# Patient Record
Sex: Female | Born: 1935
Health system: Southern US, Community
[De-identification: ages and names within clinical notes are randomized; demographics above are authoritative.]

## PROBLEM LIST (undated history)

## (undated) DIAGNOSIS — N3946 Mixed incontinence: Secondary | ICD-10-CM

## (undated) DIAGNOSIS — Z8673 Personal history of transient ischemic attack (TIA), and cerebral infarction without residual deficits: Secondary | ICD-10-CM

## (undated) DIAGNOSIS — M797 Fibromyalgia: Secondary | ICD-10-CM

## (undated) DIAGNOSIS — D649 Anemia, unspecified: Secondary | ICD-10-CM

## (undated) DIAGNOSIS — Z87898 Personal history of other specified conditions: Secondary | ICD-10-CM

## (undated) DIAGNOSIS — E0520 Thyrotoxicosis with toxic multinodular goiter without thyrotoxic crisis or storm: Secondary | ICD-10-CM

## (undated) DIAGNOSIS — I10 Essential (primary) hypertension: Secondary | ICD-10-CM

## (undated) DIAGNOSIS — Z8679 Personal history of other diseases of the circulatory system: Secondary | ICD-10-CM

## (undated) DIAGNOSIS — H409 Unspecified glaucoma: Secondary | ICD-10-CM

## (undated) DIAGNOSIS — N200 Calculus of kidney: Secondary | ICD-10-CM

## (undated) DIAGNOSIS — N281 Cyst of kidney, acquired: Secondary | ICD-10-CM

## (undated) DIAGNOSIS — Z8619 Personal history of other infectious and parasitic diseases: Secondary | ICD-10-CM

## (undated) DIAGNOSIS — Z8659 Personal history of other mental and behavioral disorders: Secondary | ICD-10-CM

## (undated) DIAGNOSIS — M1990 Unspecified osteoarthritis, unspecified site: Secondary | ICD-10-CM

## (undated) DIAGNOSIS — K219 Gastro-esophageal reflux disease without esophagitis: Secondary | ICD-10-CM

## (undated) DIAGNOSIS — E785 Hyperlipidemia, unspecified: Secondary | ICD-10-CM

## (undated) DIAGNOSIS — K2270 Barrett's esophagus without dysplasia: Secondary | ICD-10-CM

## (undated) DIAGNOSIS — I700 Atherosclerosis of aorta: Secondary | ICD-10-CM

## (undated) DIAGNOSIS — I2510 Atherosclerotic heart disease of native coronary artery without angina pectoris: Secondary | ICD-10-CM

## (undated) DIAGNOSIS — K5790 Diverticulosis of intestine, part unspecified, without perforation or abscess without bleeding: Secondary | ICD-10-CM

## (undated) DIAGNOSIS — K8689 Other specified diseases of pancreas: Secondary | ICD-10-CM

## (undated) HISTORY — PX: OTHER SURGICAL HISTORY: SHX169

## (undated) HISTORY — DX: Atherosclerosis of aorta: I70.0

## (undated) HISTORY — DX: Thyrotoxicosis with toxic multinodular goiter without thyrotoxic crisis or storm: E05.20

## (undated) HISTORY — DX: Essential (primary) hypertension: I10

## (undated) HISTORY — DX: Personal history of other diseases of the circulatory system: Z86.79

## (undated) HISTORY — DX: Personal history of other infectious and parasitic diseases: Z86.19

## (undated) HISTORY — DX: Personal history of other mental and behavioral disorders: Z86.59

## (undated) HISTORY — DX: Personal history of transient ischemic attack (TIA), and cerebral infarction without residual deficits: Z86.73

## (undated) HISTORY — DX: Atherosclerotic heart disease of native coronary artery without angina pectoris: I25.10

## (undated) HISTORY — DX: Hyperlipidemia, unspecified: E78.5

## (undated) HISTORY — DX: Personal history of other specified conditions: Z87.898

## (undated) HISTORY — DX: Calculus of kidney: N20.0

## (undated) HISTORY — DX: Anemia, unspecified: D64.9

## (undated) HISTORY — DX: Gastro-esophageal reflux disease without esophagitis: K21.9

## (undated) HISTORY — DX: Other specified diseases of pancreas: K86.89

## (undated) HISTORY — DX: Unspecified osteoarthritis, unspecified site: M19.90

## (undated) HISTORY — DX: Fibromyalgia: M79.7

## (undated) HISTORY — DX: Barrett's esophagus without dysplasia: K22.70

## (undated) HISTORY — DX: Mixed incontinence: N39.46

## (undated) HISTORY — DX: Cyst of kidney, acquired: N28.1

## (undated) HISTORY — DX: Diverticulosis of intestine, part unspecified, without perforation or abscess without bleeding: K57.90

## (undated) HISTORY — DX: Unspecified glaucoma: H40.9

---

## 1988-09-11 HISTORY — PX: CATARACT EXTRACTION: SUR2

## 2004-09-11 HISTORY — PX: CHOLECYSTECTOMY: SHX55

## 2006-09-11 DIAGNOSIS — Z8673 Personal history of transient ischemic attack (TIA), and cerebral infarction without residual deficits: Secondary | ICD-10-CM

## 2006-09-11 HISTORY — DX: Personal history of transient ischemic attack (TIA), and cerebral infarction without residual deficits: Z86.73

## 2006-09-11 LAB — HM DEXA SCAN: HM DEXA SCAN: NORMAL

## 2009-09-11 DIAGNOSIS — Z8679 Personal history of other diseases of the circulatory system: Secondary | ICD-10-CM

## 2009-09-11 HISTORY — PX: LITHOTRIPSY: SUR834

## 2009-09-11 HISTORY — DX: Personal history of other diseases of the circulatory system: Z86.79

## 2011-09-05 ENCOUNTER — Emergency Department: Payer: Self-pay | Admitting: Emergency Medicine

## 2011-09-05 LAB — COMPREHENSIVE METABOLIC PANEL
ALT: 21 U/L (ref 7–35)
AST: 24 U/L
Albumin: 4.1
BUN: 7 mg/dL (ref 4–21)
Glucose: 97
Potassium: 3.2 mmol/L
Sodium: 139 mmol/L (ref 137–147)
Total Bilirubin: 0.7 mg/dL
Total Protein ELP: 8.4

## 2011-09-05 LAB — CBC: platelet count: 290

## 2011-09-05 IMAGING — CR DG ABDOMEN 3V
1 series · 4 of 4 positions shown · non-contrast
Comparison: none

REASON FOR EXAM: vomiting
COMMENTS:

[Series 1: pa · 0.17mm/px · 4 of 4 slices shown]
[im 1/4]
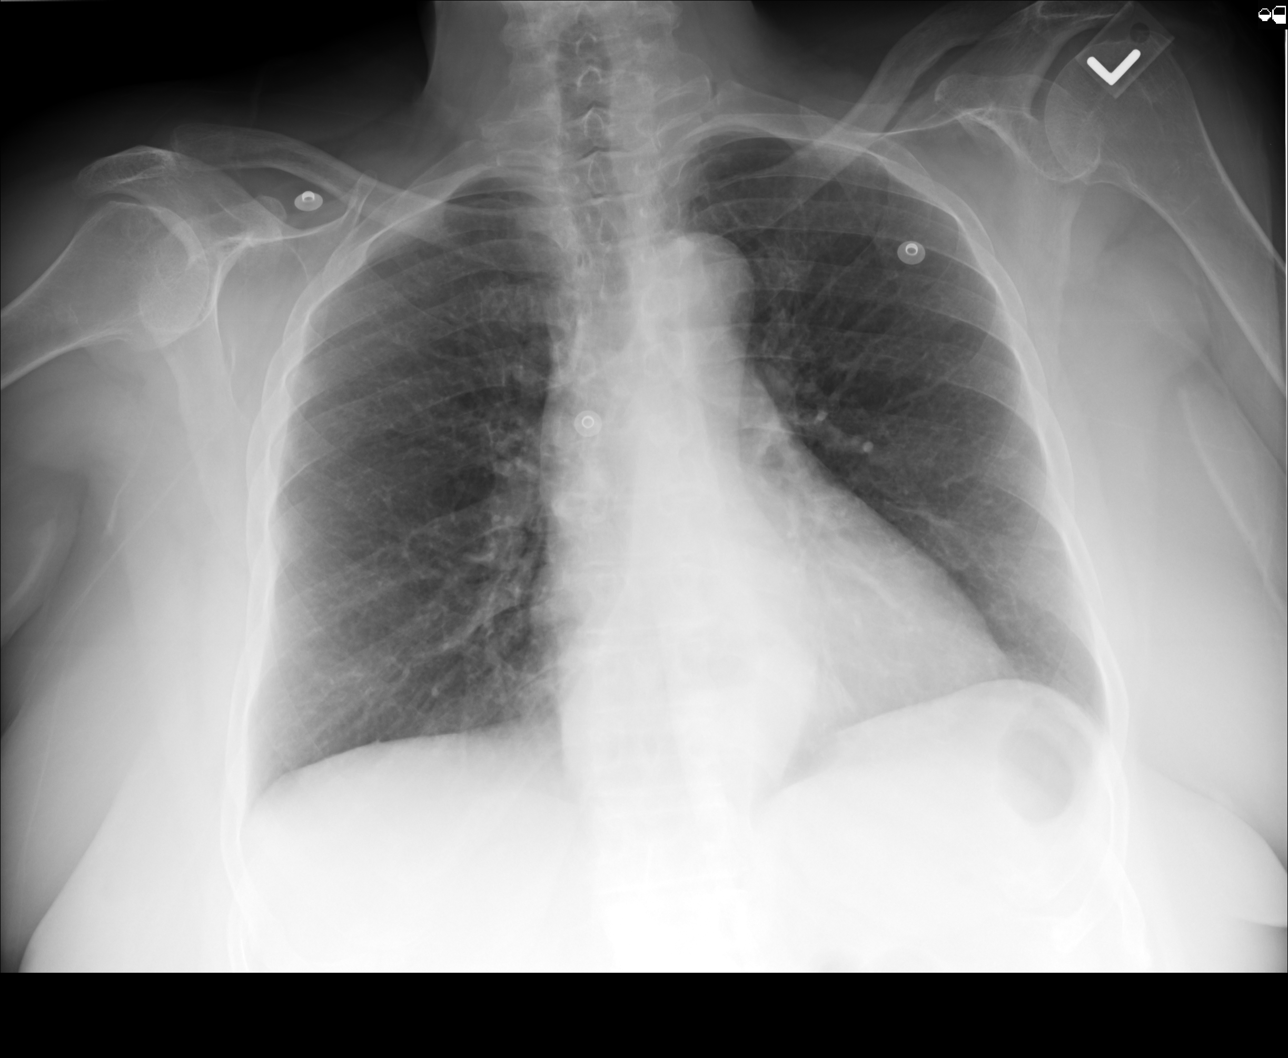
[im 2/4]
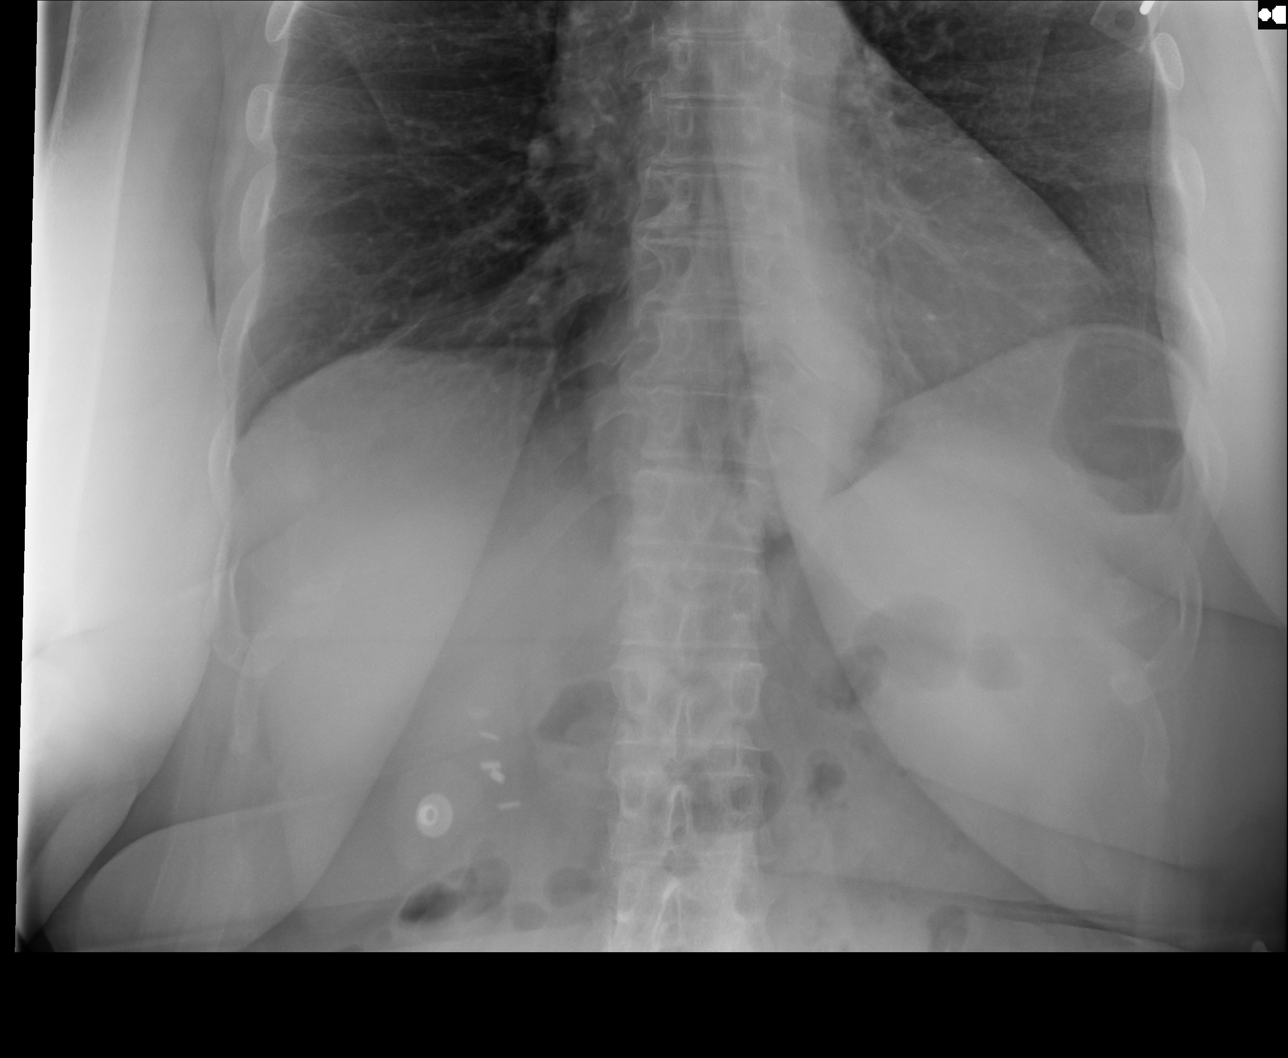
[im 3/4]
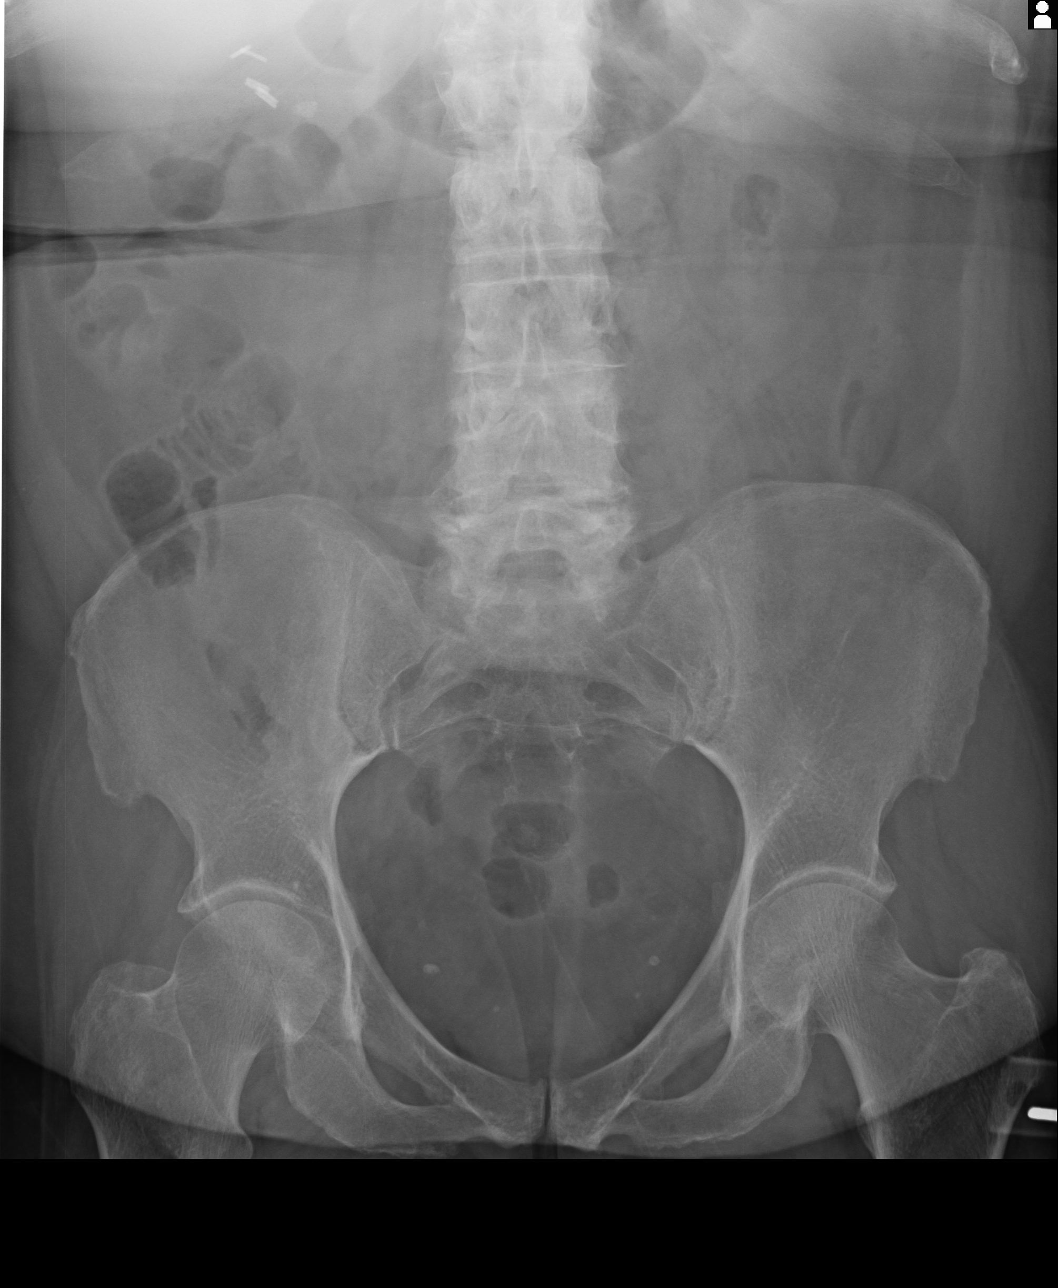
[im 4/4]
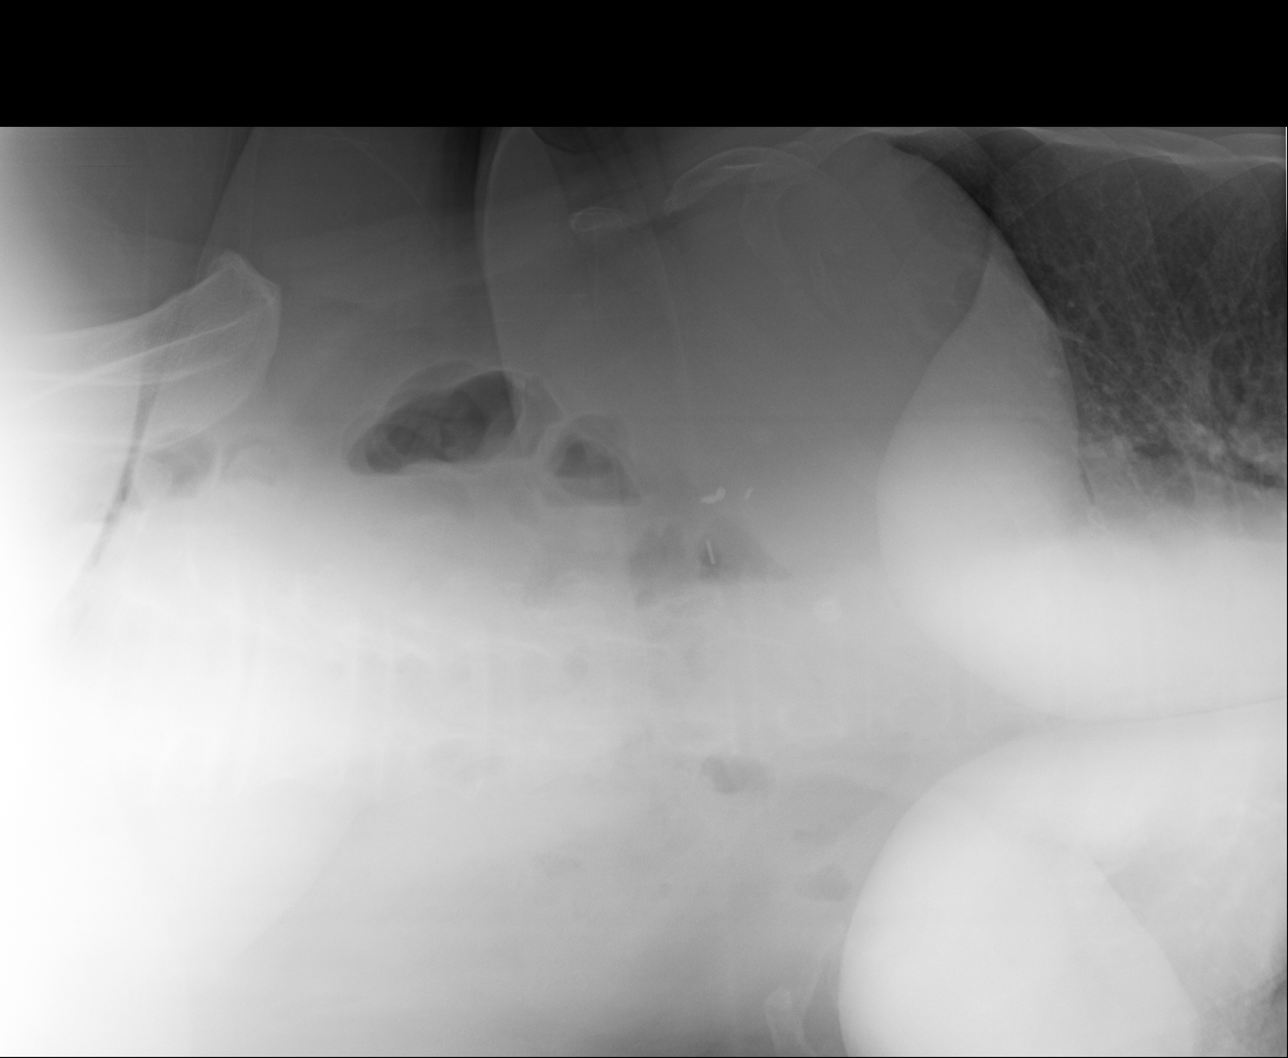

[4 of 4 positions shown; findings below may reference images not displayed]

PROCEDURE:     DXR - DXR ABDOMEN 3-WAY (INCL PA CXR)  - [DATE]  [DATE]

RESULT:     The lungs are well-expanded and clear. The cardiac silhouette is
normal in size. There is a hiatal hernia present. I see no pulmonary
vascular congestion nor pleural effusion. Within the upper abdomen the bowel
gas pattern is relatively nonspecific. There is no evidence of ileus nor
obstruction. There are surgical clips in the gallbladder fossa.
IMPRESSION: I do not see evidence of acute cardiopulmonary abnormality.
No acute intra-abdominal abnormality is identified either.

## 2011-09-28 ENCOUNTER — Ambulatory Visit (INDEPENDENT_AMBULATORY_CARE_PROVIDER_SITE_OTHER): Payer: Medicare HMO | Admitting: Family Medicine

## 2011-09-28 ENCOUNTER — Encounter: Payer: Self-pay | Admitting: Family Medicine

## 2011-09-28 DIAGNOSIS — K2270 Barrett's esophagus without dysplasia: Secondary | ICD-10-CM

## 2011-09-28 DIAGNOSIS — R1013 Epigastric pain: Secondary | ICD-10-CM | POA: Insufficient documentation

## 2011-09-28 DIAGNOSIS — K1370 Unspecified lesions of oral mucosa: Secondary | ICD-10-CM | POA: Insufficient documentation

## 2011-09-28 DIAGNOSIS — I10 Essential (primary) hypertension: Secondary | ICD-10-CM

## 2011-09-28 DIAGNOSIS — K219 Gastro-esophageal reflux disease without esophagitis: Secondary | ICD-10-CM

## 2011-09-28 DIAGNOSIS — E785 Hyperlipidemia, unspecified: Secondary | ICD-10-CM

## 2011-09-28 DIAGNOSIS — Z8673 Personal history of transient ischemic attack (TIA), and cerebral infarction without residual deficits: Secondary | ICD-10-CM | POA: Insufficient documentation

## 2011-09-28 DIAGNOSIS — IMO0001 Reserved for inherently not codable concepts without codable children: Secondary | ICD-10-CM

## 2011-09-28 DIAGNOSIS — J45909 Unspecified asthma, uncomplicated: Secondary | ICD-10-CM | POA: Insufficient documentation

## 2011-09-28 DIAGNOSIS — N3946 Mixed incontinence: Secondary | ICD-10-CM

## 2011-09-28 DIAGNOSIS — E0520 Thyrotoxicosis with toxic multinodular goiter without thyrotoxic crisis or storm: Secondary | ICD-10-CM | POA: Insufficient documentation

## 2011-09-28 DIAGNOSIS — E042 Nontoxic multinodular goiter: Secondary | ICD-10-CM

## 2011-09-28 DIAGNOSIS — M797 Fibromyalgia: Secondary | ICD-10-CM

## 2011-09-28 MED ORDER — PRAVASTATIN SODIUM 40 MG PO TABS: 40.0000 mg | ORAL_TABLET | Freq: Every day | ORAL | Status: DC

## 2011-09-28 MED ORDER — ESTROGENS, CONJUGATED 0.625 MG/GM VA CREA: 0.5000 g | TOPICAL_CREAM | VAGINAL | Status: DC

## 2011-09-28 MED ORDER — ERGOCALCIFEROL 1.25 MG (50000 UT) PO CAPS: 50000.0000 [IU] | ORAL_CAPSULE | ORAL | Status: DC

## 2011-09-28 MED ORDER — METOPROLOL TARTRATE 25 MG PO TABS: 25.0000 mg | ORAL_TABLET | Freq: Two times a day (BID) | ORAL | Status: DC

## 2011-09-28 MED ORDER — GABAPENTIN 300 MG PO CAPS: 300.0000 mg | ORAL_CAPSULE | Freq: Three times a day (TID) | ORAL | Status: DC

## 2011-09-28 MED ORDER — CLONIDINE HCL 0.1 MG PO TABS: 0.1000 mg | ORAL_TABLET | Freq: Two times a day (BID) | ORAL | Status: DC

## 2011-09-28 MED ORDER — AMLODIPINE BESYLATE 5 MG PO TABS: 5.0000 mg | ORAL_TABLET | Freq: Every day | ORAL | Status: DC

## 2011-09-28 MED ORDER — ONDANSETRON HCL 4 MG PO TABS: 4.0000 mg | ORAL_TABLET | Freq: Three times a day (TID) | ORAL | Status: DC | PRN

## 2011-09-28 MED ORDER — ESOMEPRAZOLE MAGNESIUM 40 MG PO CPDR: 40.0000 mg | DELAYED_RELEASE_CAPSULE | Freq: Every day | ORAL | Status: DC

## 2011-09-28 MED ORDER — TRAZODONE HCL 50 MG PO TABS: 50.0000 mg | ORAL_TABLET | Freq: Every day | ORAL | Status: DC

## 2011-09-28 MED ORDER — DOXYCYCLINE HYCLATE 100 MG PO CAPS: 100.0000 mg | ORAL_CAPSULE | Freq: Two times a day (BID) | ORAL | Status: AC

## 2011-09-28 MED ORDER — SUCRALFATE 1 G PO TABS: 1.0000 g | ORAL_TABLET | Freq: Four times a day (QID) | ORAL | Status: DC

## 2011-09-28 MED ORDER — PIRBUTEROL ACETATE 200 MCG/INH IN AERB: 2.0000 | INHALATION_SPRAY | Freq: Four times a day (QID) | RESPIRATORY_TRACT | Status: DC

## 2011-09-28 MED ORDER — OXYBUTYNIN CHLORIDE ER 10 MG PO TB24: 10.0000 mg | ORAL_TABLET | Freq: Every day | ORAL | Status: DC

## 2011-09-28 MED ORDER — METHIMAZOLE 5 MG PO TABS: 5.0000 mg | ORAL_TABLET | Freq: Every day | ORAL | Status: DC

## 2011-09-28 NOTE — Assessment & Plan Note (Signed)
Refer back to GI. 

## 2011-09-28 NOTE — Assessment & Plan Note (Signed)
Check FLP when returns fasting.  Refilled pravastatin. States intolerance to zocor and lipitor in past

## 2011-09-28 NOTE — Assessment & Plan Note (Addendum)
Mild on exam today.  However does endorse red flag sxs of weight loss and early satiety in h/o barett's esophagus. Will refer to GI for likely EGD. Change omeprazole to nexium daily. Have requested records today from prior PCP as well as evaluation at [facility], will review when arrive.  Pt states has had recent labwork at [facility]. RTC sooner if worsening. Continue zofran for nausea/vomiting.

## 2011-09-28 NOTE — Progress Notes (Addendum)
Subjective:    Patient ID: Brianna Woods, female    DOB: [birth year], 77 y.o.   MRN: [id number]  HPI CC: new medicare pt  Presents to establish today , comes in with daughter in law, Desma Paganini.  See scanned document re: concern about Ms Uram abusing pain meds.  Recently moved from [place].  Walks with rolling walker for "FM pain".   ==>ADDENDUM, unable to scan form, in essence per daughter in law, pt with hx of misusing pain meds.  Was on prozac for depression, didn't work so stopped.  Also tried effexor, didn't help.  Bad GERD with h/o barrett's esophagus, s/p stricture with dilation, last EGD 2008.  Takes omeprazole 40mg  daily.  14 lb weight loss in last 2 months.  States vomiting with any food.  Epigastric pain described as sharp pain then nagging and burning that stays mainly epigastric.  Stays with nausea.  Zofran does help some.  Endorses early satiety.  Nausea and vomiting with any food.  Stays away from spicy foods.  No caffeine.  Was taking motrin for pain but stopped on Christmas (see below).  NBNB emesis.  Mouth pain - no teeth, unable to wear dentures, states has been told by dentist must have gumwork prior to dentures.  Now noticing sores in mouth that are tender, worried about gum infection.  PCN allergy.  Did have [facility] ER visit 09/05/2011 for epigastric pain, told had ulcer and sent home.  No records available from [facility] or from prior PCP, have requested today.  = => ADDENDUM: received records from [facility], dx with GERD and viral gastroenteritis.  rec f/u with Dr. Niel Hummer.  Poor quality EKG, did not scan.  Read as NSR @ 83 with RBBB.  CXR- no acute finding, CE neg x1.  Caffeine: 4 bottles sprite/day Lives alone, moved from [place], son and daughter in Social worker in area Desma Paganini) Occupation: retired.  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 Audie Box in [place] Colonoscopy 2008, good for 10 yrs.  EGD then as well. Last mammogram 1 yr ago, normal. Flu shot - today Tetanus - 2008 Pneumonia shot - done 2012 per pt Shingles shot - doesn't think would want. Last CPE was 11/2010.  Medications and allergies reviewed and updated in chart.  Past histories reviewed and updated if relevant as below. There is no problem list on file for this patient.  Past Medical History  Diagnosis Date  . HLD (hyperlipidemia)   . HTN (hypertension)   . Fibromyalgia   . Ulcer   . Right kidney stone     s/p surgery  . Mixed incontinence     on ditropan  . GERD (gastroesophageal reflux disease)   . Barrett's esophagus     stricture s/p dilation 2008  . Goiter   . Hyperthyroidism    Past Surgical History  Procedure Date  . Cholecystectomy 2006  . Cataract extraction 1990  . Kidney stone surgery 2011    R kidney, crushed   History  Substance Use Topics  . Smoking status: Never Smoker   . Smokeless tobacco: Never Used  . Alcohol Use: No   Family History  Problem Relation Age of Onset  . Hyperlipidemia Mother   . Hypertension Mother   . Stroke Father   . Other Brother     TB  . Coronary artery disease Brother   .  Stroke Son     aneurysm  . Diabetes Maternal Aunt   . Cancer Neg Hx    Allergies  Allergen Reactions  . Ivp Dye (Iodinated Diagnostic Agents)     Turns red; BP and HR go up  . Penicillins     "Sends me in left field"   No current outpatient prescriptions on file prior to visit.   Review of Systems  Constitutional: Positive for fever and unexpected weight change (14lb weight loss). Negative for chills, activity change, appetite change and fatigue.  HENT: Negative for hearing loss and neck pain.   Eyes: Negative for visual disturbance.  Respiratory: Negative for cough, chest tightness, shortness of breath and wheezing.   Cardiovascular: Negative for chest pain, palpitations and leg swelling.    Gastrointestinal: Positive for nausea, vomiting, abdominal pain and constipation. Negative for diarrhea, blood in stool and abdominal distention.  Genitourinary: Negative for dysuria, hematuria and difficulty urinating.  Musculoskeletal: Negative for myalgias and arthralgias.  Skin: Negative for rash.  Neurological: Negative for dizziness, seizures, syncope and headaches.  Hematological: Does not bruise/bleed easily.  Psychiatric/Behavioral: Positive for dysphoric mood. The patient is nervous/anxious.        Objective:   Physical Exam  Nursing note and vitals reviewed. Constitutional: She is oriented to person, place, and time. She appears well-developed and well-nourished. No distress.       Walks with walker  HENT:  Head: Normocephalic and atraumatic.  Right Ear: Hearing, tympanic membrane, external ear and ear canal normal.  Left Ear: Hearing, tympanic membrane, external ear and ear canal normal.  Nose: Nose normal. No mucosal edema or rhinorrhea.  Mouth/Throat: Uvula is midline, oropharynx is clear and moist and mucous membranes are normal. No oropharyngeal exudate, posterior oropharyngeal edema, posterior oropharyngeal erythema or tonsillar abscesses.       Upper right gum with sore  Eyes: Conjunctivae and EOM are normal. Pupils are equal, round, and reactive to light. No scleral icterus.  Neck: Normal range of motion. Neck supple. No thyromegaly present.  Cardiovascular: Normal rate, regular rhythm, normal heart sounds and intact distal pulses.   No murmur heard. Pulses:      Radial pulses are 2+ on the right side, and 2+ on the left side.       No murmur appreciated today  Pulmonary/Chest: Effort normal and breath sounds normal. No respiratory distress. She has no wheezes. She has no rales.  Abdominal: Soft. Bowel sounds are normal. She exhibits no distension and no mass. There is no hepatosplenomegaly. There is tenderness (mild epigastric tenderness). There is no rigidity, no  rebound, no guarding, no CVA tenderness and negative Murphy's sign.  Musculoskeletal: Normal range of motion.  Lymphadenopathy:    She has no cervical adenopathy.  Neurological: She is alert and oriented to person, place, and time.       CN grossly intact, station and gait intact  Skin: Skin is warm and dry. No rash noted.  Psychiatric: She has a normal mood and affect. Her behavior is normal. Judgment and thought content normal.      Assessment & Plan:  Refilled all meds per pt request.

## 2011-09-28 NOTE — Assessment & Plan Note (Signed)
Continue methimazole, check blood work when returns for next visit, await records from prior PCP

## 2011-09-28 NOTE — Assessment & Plan Note (Signed)
Change omeprazole to nexium

## 2011-09-28 NOTE — Patient Instructions (Addendum)
Continue oragel for mouth.  See dentist for further evaluation.  Take doxycycline for 10 days. Pass by Marion's office to set up referral to stomach doctor for likely EGD. I'll request records from [facility] and from Dr. Quin Hoop. Stop omeprazole, start taking nexium 40mg  daily. Return in 1 month for recheck blood pressure and sooner if needed. Return in March for medicare wellness visit, fasting prior for blood work.

## 2011-09-28 NOTE — Assessment & Plan Note (Signed)
Shallow ulcer right upper gumline. Will treat with doxy course (PCN allergy) and asked pt to see dentist for further evaluation. Continue oragel

## 2011-09-28 NOTE — Assessment & Plan Note (Signed)
Per pt labile (has been as high as 200sbp). States takes amlodipine 5mg  daily and uses clonidine prn spikes in blood pressure.  Continue this regimen for now. Await records.

## 2011-09-28 NOTE — Assessment & Plan Note (Signed)
Refilled maxair rescue inhaler.

## 2011-09-28 NOTE — Assessment & Plan Note (Signed)
Per pt but off ASA 2/2 GI issues, ulcer hx.

## 2011-09-28 NOTE — Assessment & Plan Note (Signed)
Monitor for now, await records.

## 2011-10-01 ENCOUNTER — Encounter: Payer: Self-pay | Admitting: Family Medicine

## 2011-10-19 ENCOUNTER — Other Ambulatory Visit: Payer: Self-pay | Admitting: Family Medicine

## 2011-10-19 NOTE — Telephone Encounter (Signed)
Sent in.  Uses clonidine prn bp spikes (h/o labile bp)

## 2011-10-26 ENCOUNTER — Encounter: Payer: Self-pay | Admitting: Family Medicine

## 2011-10-26 ENCOUNTER — Ambulatory Visit: Payer: Self-pay | Admitting: Gastroenterology

## 2011-10-26 IMAGING — US US PELV - US TRANSVAGINAL
1 series · 17 of 25 positions shown · non-contrast
Comparison: none

REASON FOR EXAM: gen abd pain nausea alone gastroesophageal reflux
COMMENTS:

[Series 1: us pelv - us transvaginal · 17 of 60 slices shown]
[im 1/60]
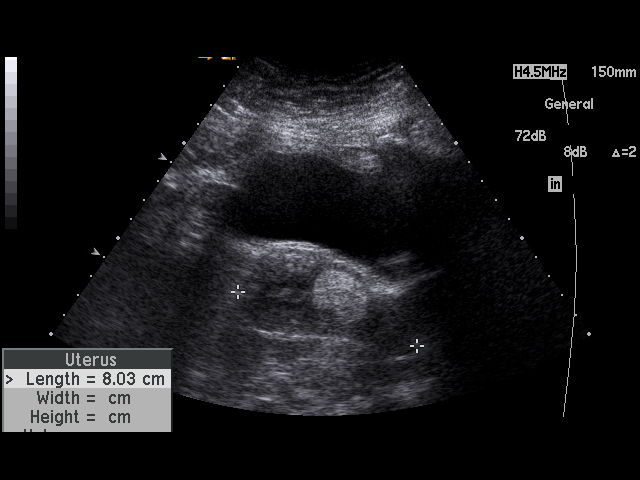
[im 5/60]
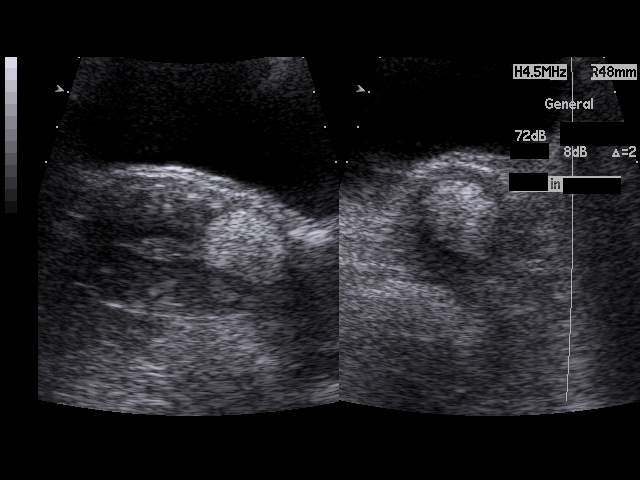
[im 8/60]
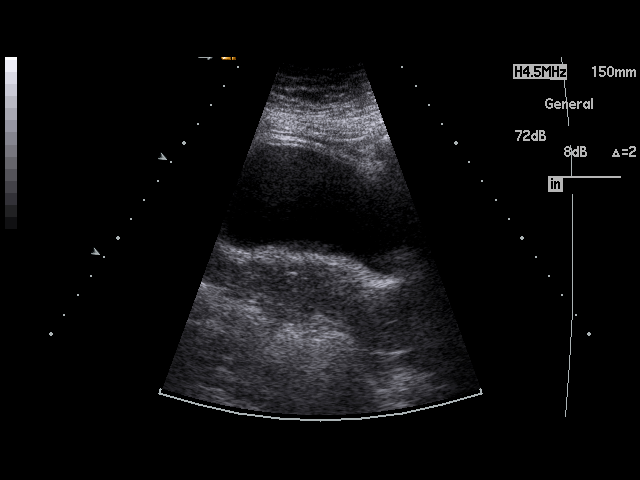
[im 13/60]
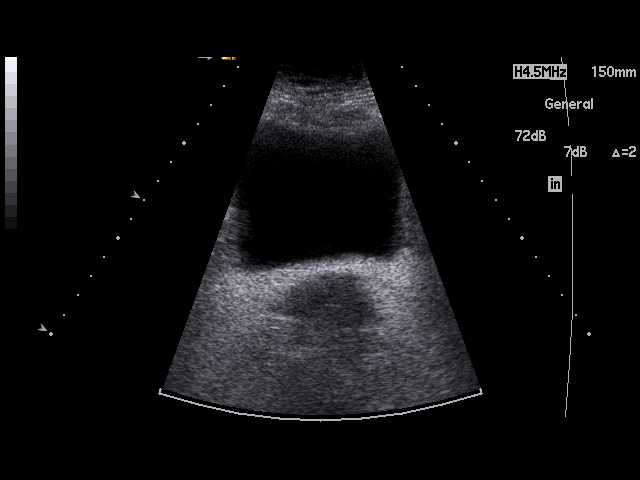
[im 15/60]
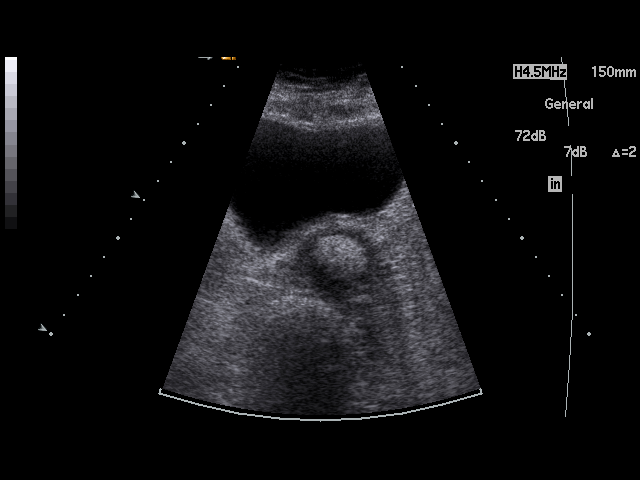
[im 20/60]
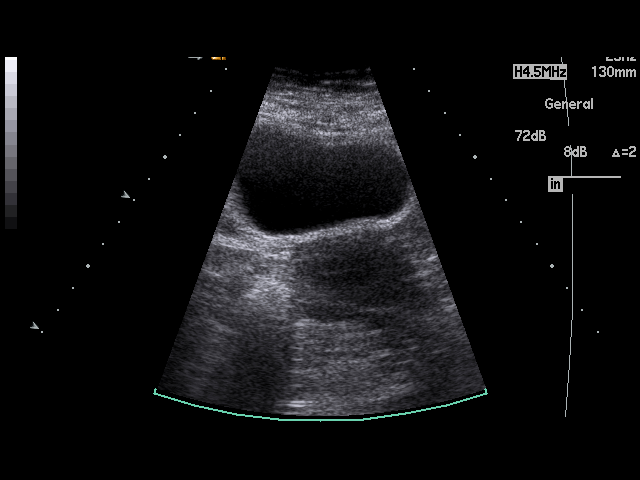
[im 23/60]
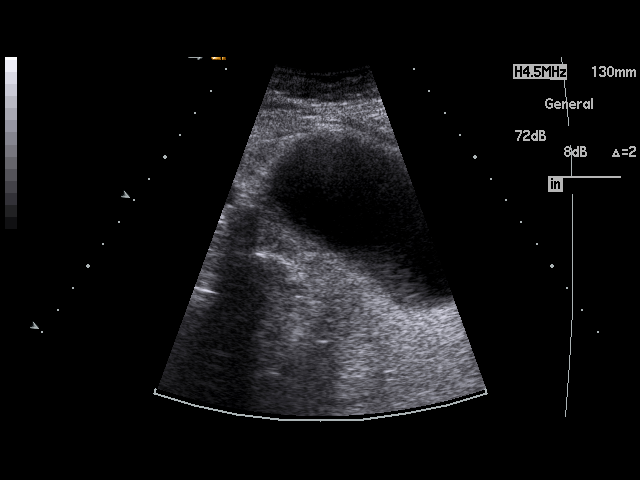
[im 28/60]
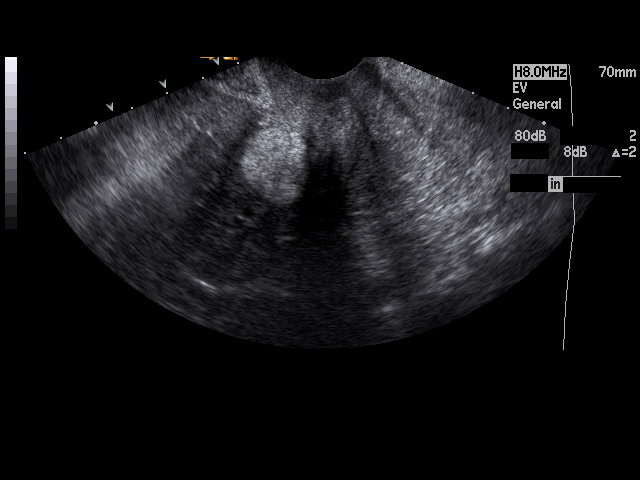
[im 30/60]
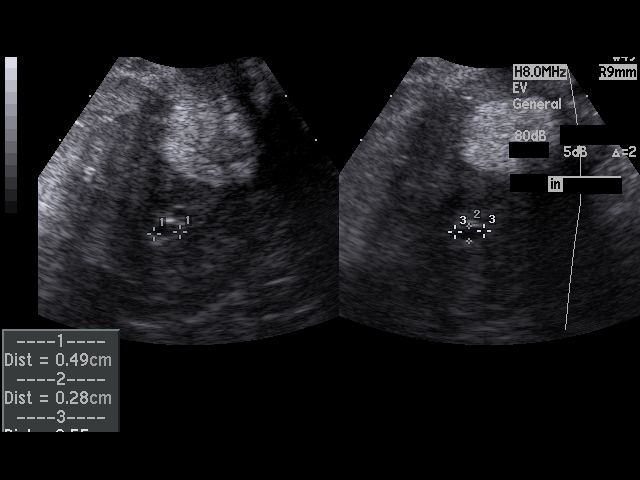
[im 32/60]
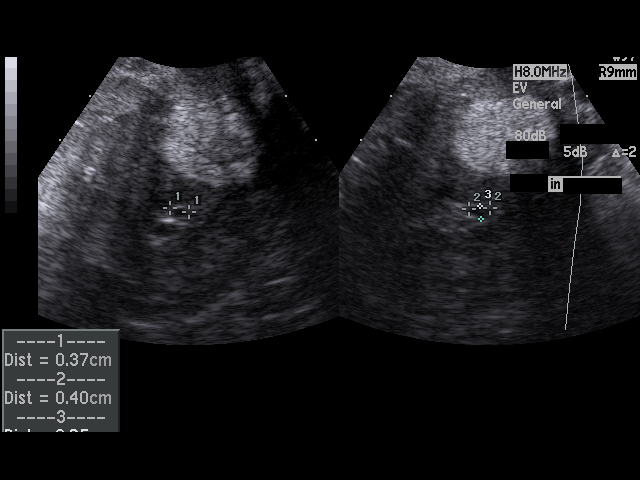
[im 37/60]
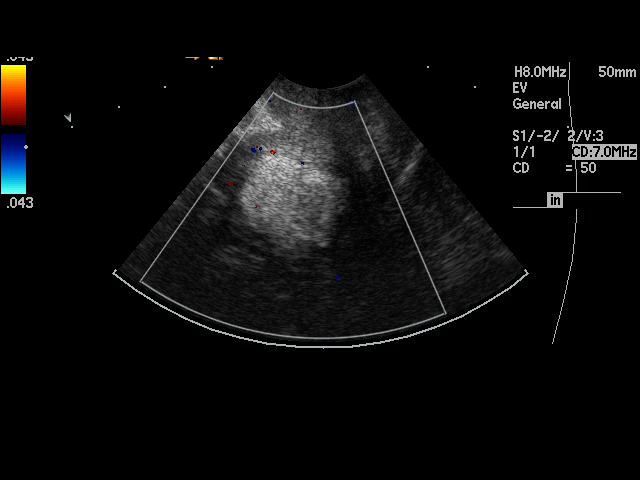
[im 40/60]
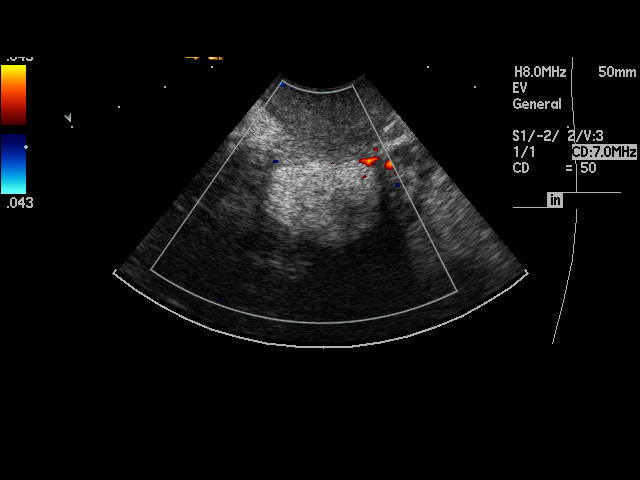
[im 45/60]
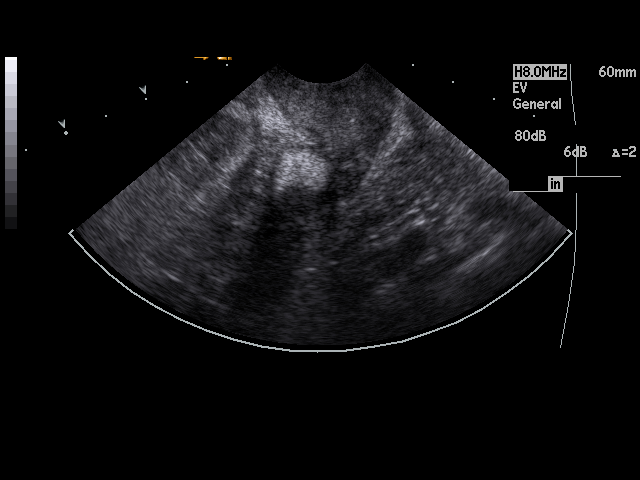
[im 47/60]
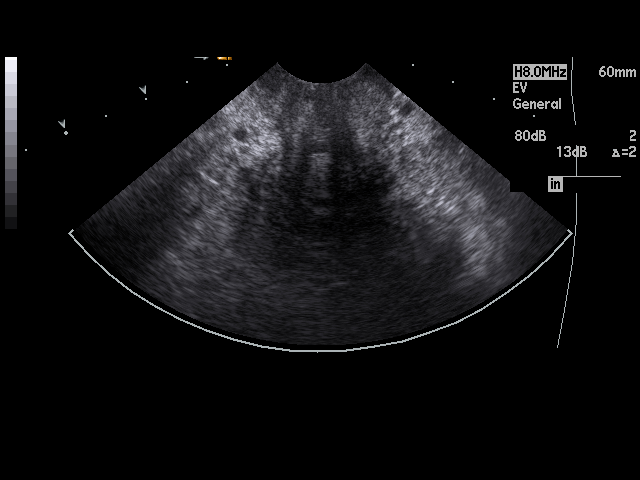
[im 52/60]
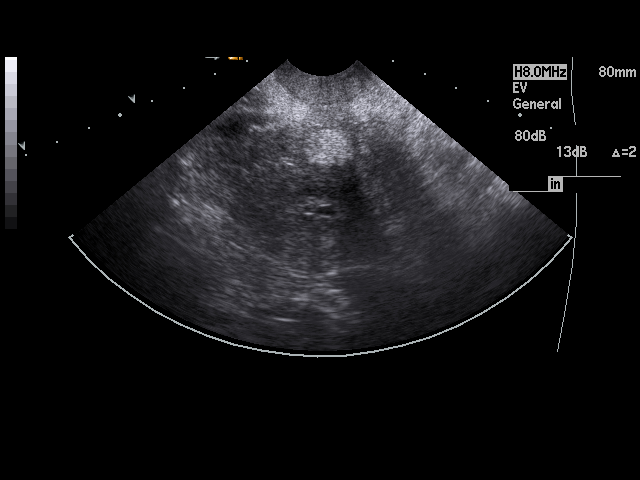
[im 55/60]
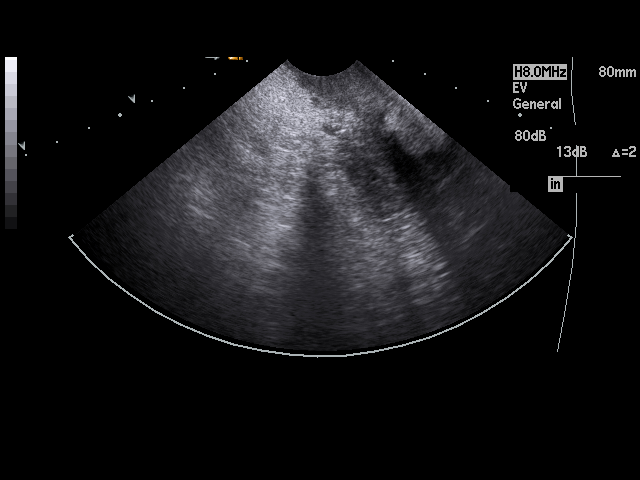
[im 60/60]
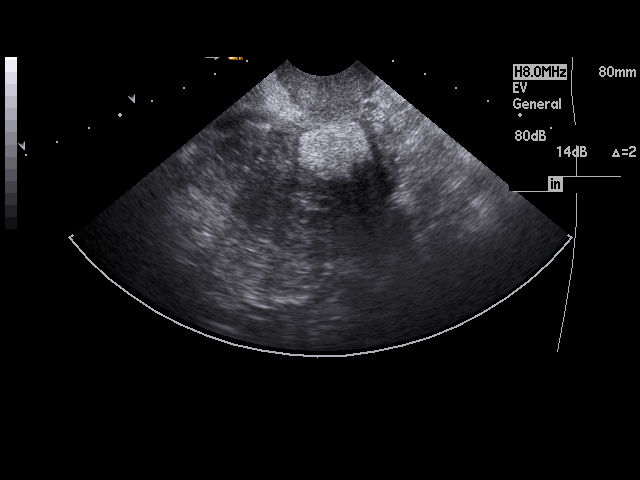

[17 of 25 positions shown; findings below may reference images not displayed]

PROCEDURE:     US  - US PELVIS EXAM W/TRANSVAGINAL  - [DATE]  [DATE]

RESULT:

The uterus measures 5.03 x 5.25 x 3.52 cm and contains a hyperechoic mass in
the mid uterus, slightly anterior and toward the right, measuring 2.39 x
2.12 x 2.11 cm. The ovaries are not visualized. There is no shadowing within
the hyperechoic mass. The endometrial stripe measures 3.7 mm thick. There
appears to be a tiny amount of fluid which could represent an endometrial
cyst measuring 0.49 x 0.28 x 0.55 cm with a second, smaller similar area
measuring 0.37 x 0.40 x 0.25 cm. Doppler assessment of the echoic mass shows
some minimal vascular flow within it.
IMPRESSION: 1.  Hyperechoic mass within the uterus. No definite calcification. Two small
fluid collections, possibly endometrial cysts. Gynecologic surgical
consultation is recommended.
2.  The ovaries could not be visualized.

## 2011-10-26 IMAGING — US ABDOMEN ULTRASOUND
1 series · 17 of 25 positions shown · non-contrast
Comparison: none

REASON FOR EXAM: gen abd pain nausea alone gastroesophageal reflux
COMMENTS:

[Series 1: abdomen ultrasound · 17 of 55 slices shown]
[im 1/55]
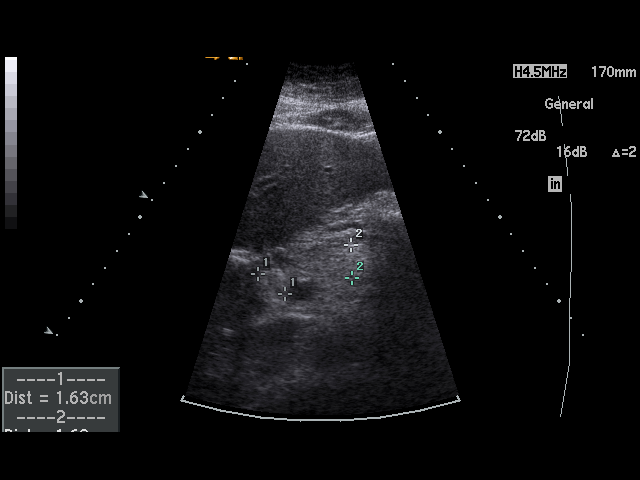
[im 5/55]
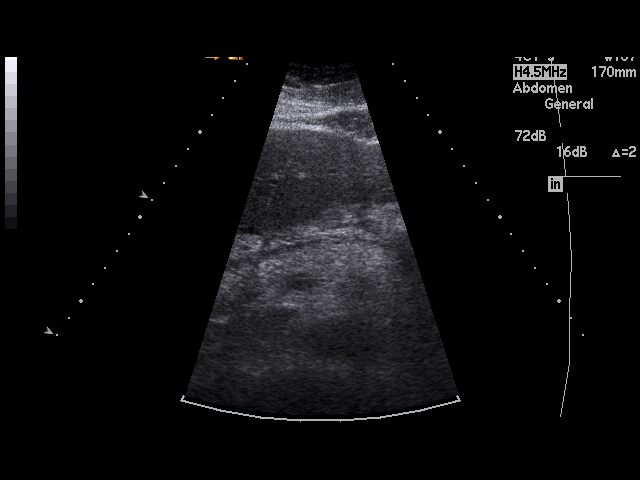
[im 7/55]
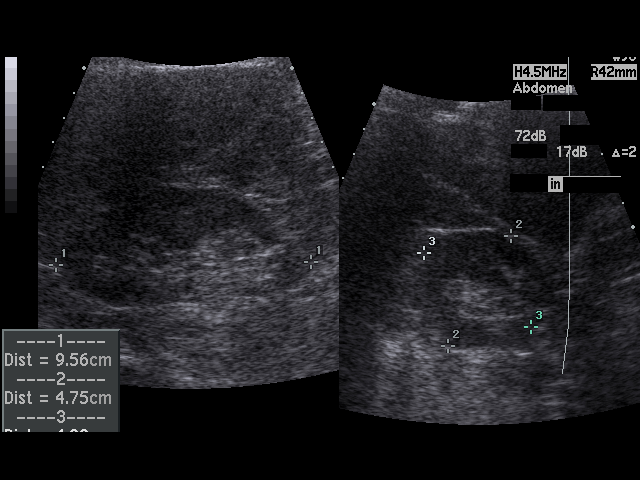
[im 12/55]
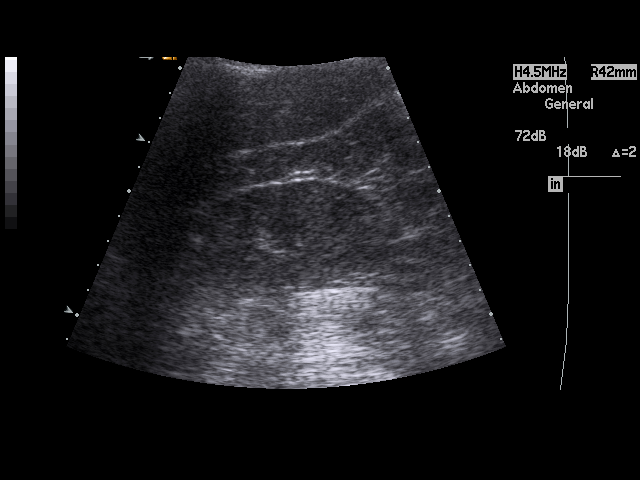
[im 14/55]
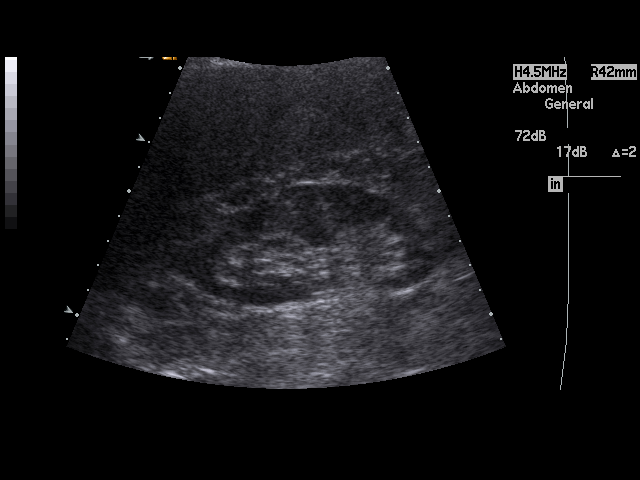
[im 19/55]
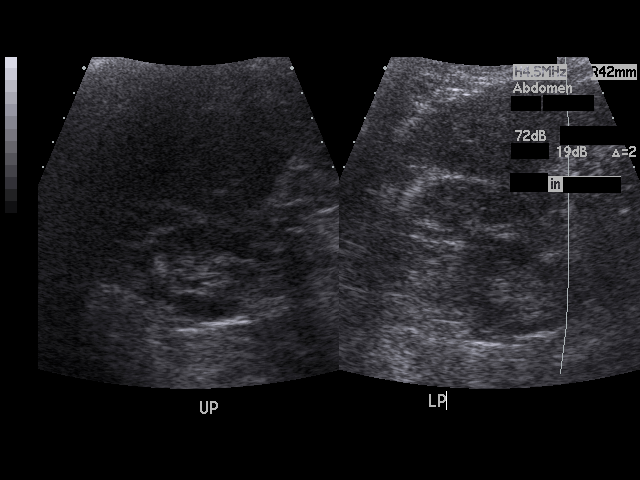
[im 21/55]
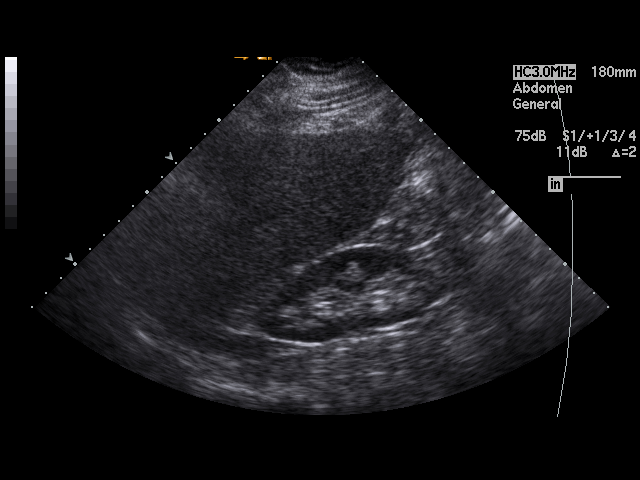
[im 25/55]
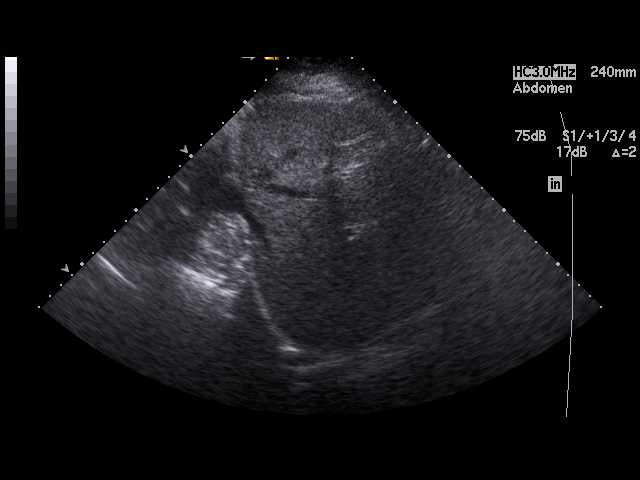
[im 28/55]
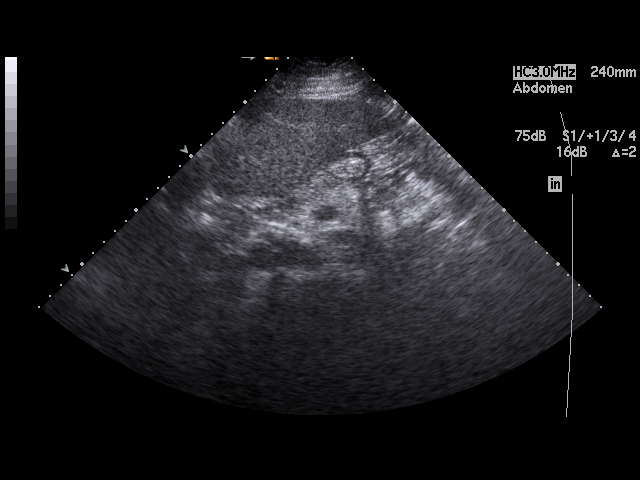
[im 30/55]
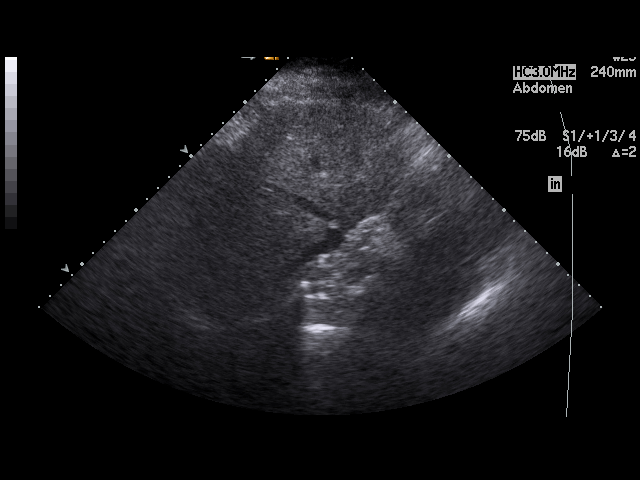
[im 34/55]
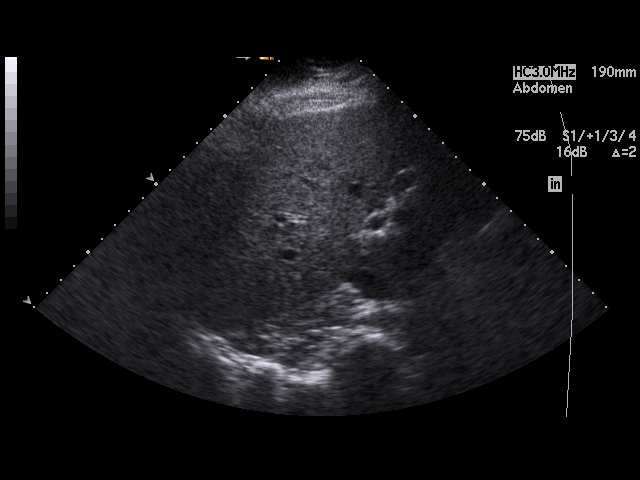
[im 37/55]
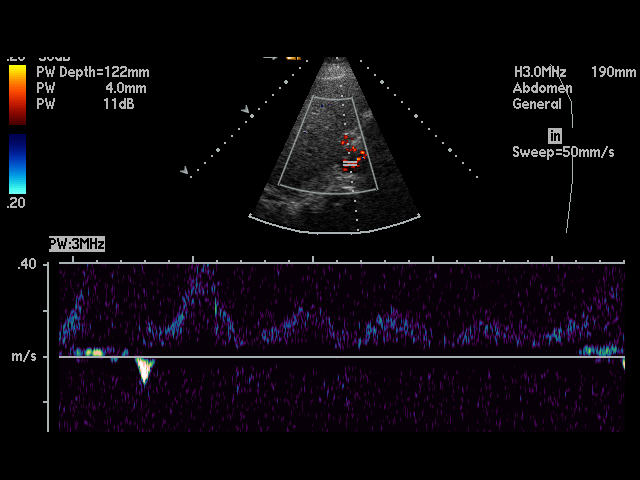
[im 41/55]
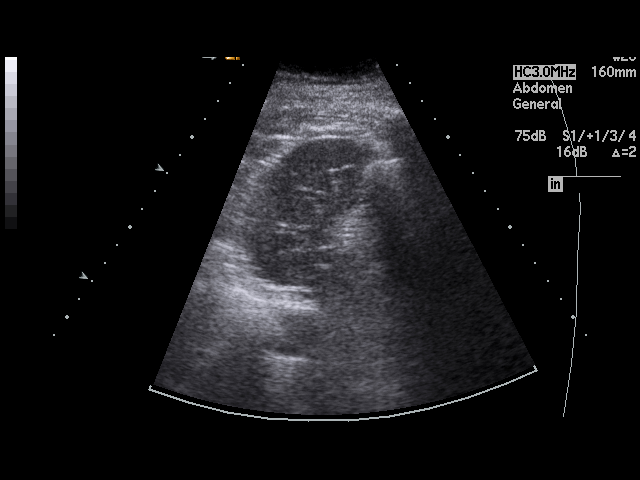
[im 43/55]
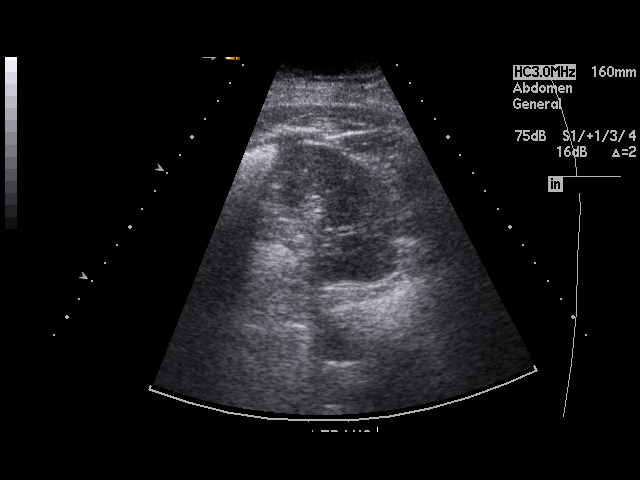
[im 48/55]
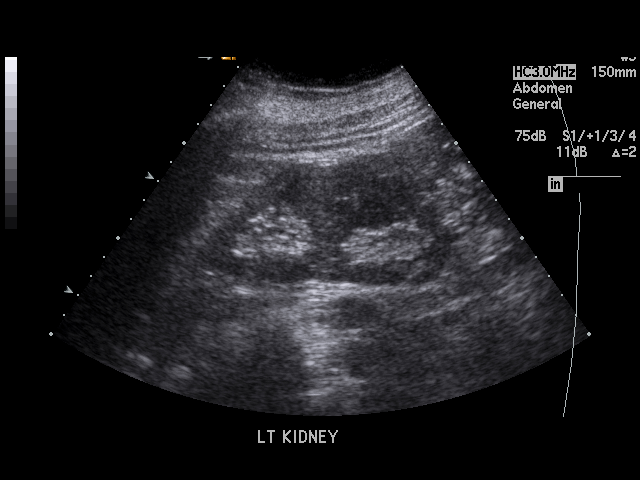
[im 50/55]
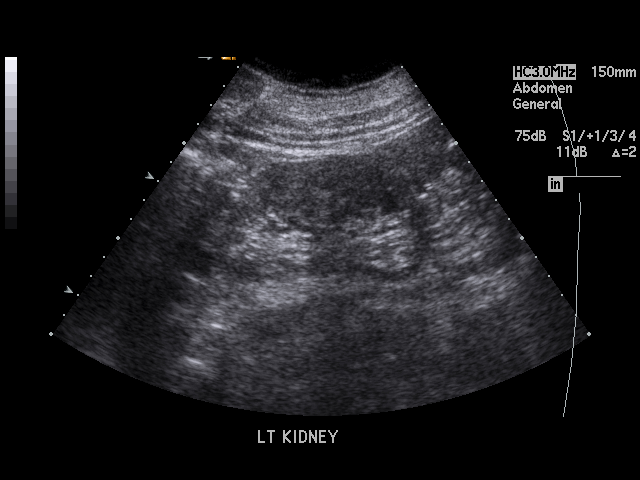
[im 55/55]
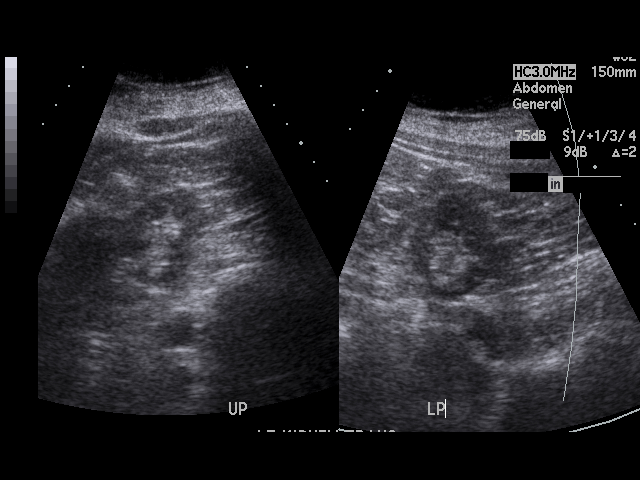

[17 of 25 positions shown; findings below may reference images not displayed]

PROCEDURE:     US  - US ABDOMEN GENERAL SURVEY  - [DATE]  [DATE]

RESULT:

Abdominal Sonogram is performed in the standard fashion. There is a reported
history of laparoscopic cholecystectomy in [2S]. The visualized portions of
the pancreas appear normal in echotexture and size. The right kidney
measures 9.56 x 4.75 x 4.88 cm and contains an upper pole hyperechoic 0.90 x
0.90 x 0.80 cm mass which may represent an angiomyolipoma. The cortical
thickness is 1.14 cm. The hepatic echotexture appears normal. No
intrahepatic biliary ductal dilation is evident. The portal venous flow is
unremarkable. The common bile duct diameter is 7.6 mm. The spleen measures
7.79 cm and shows normal echotexture. The aorta cannot be visualized because
of overlying bowel gas. The proximal inferior vena cava appears normal. The
left kidney measures 11.11 x 6.45 x 5.28 cm without evidence of a mass or
obstructive change. The left renal cortical thickness is 1.63 cm.
IMPRESSION: 1.  Echogenic mass in the upper pole region of the right kidney which may
represent an angiomyolipoma.
2.  Limited visualization of the pancreas without gross abnormality.
3.  Nonvisualization of the aorta.

## 2011-10-31 ENCOUNTER — Ambulatory Visit: Payer: Self-pay | Admitting: Gynecologic Oncology

## 2011-11-10 HISTORY — PX: ESOPHAGOGASTRODUODENOSCOPY: SHX1529

## 2011-11-14 ENCOUNTER — Ambulatory Visit: Payer: Self-pay | Admitting: Gynecologic Oncology

## 2011-11-20 ENCOUNTER — Ambulatory Visit: Payer: Self-pay | Admitting: Gastroenterology

## 2011-11-22 LAB — PATHOLOGY REPORT

## 2011-11-26 ENCOUNTER — Encounter: Payer: Self-pay | Admitting: Family Medicine

## 2011-11-28 ENCOUNTER — Encounter: Payer: Self-pay | Admitting: Family Medicine

## 2011-12-08 ENCOUNTER — Other Ambulatory Visit: Payer: Self-pay | Admitting: Family Medicine

## 2011-12-11 ENCOUNTER — Ambulatory Visit: Payer: Self-pay | Admitting: Gynecologic Oncology

## 2011-12-19 ENCOUNTER — Other Ambulatory Visit: Payer: Self-pay | Admitting: Family Medicine

## 2011-12-19 NOTE — Telephone Encounter (Signed)
Ok to refill 

## 2011-12-24 ENCOUNTER — Encounter: Payer: Self-pay | Admitting: Family Medicine

## 2012-01-22 ENCOUNTER — Encounter: Payer: Self-pay | Admitting: Family Medicine

## 2012-01-22 ENCOUNTER — Ambulatory Visit (INDEPENDENT_AMBULATORY_CARE_PROVIDER_SITE_OTHER): Payer: Medicare HMO | Admitting: Family Medicine

## 2012-01-22 VITALS — BP 126/78 | HR 84 | Temp 97.9°F | Wt 190.5 lb

## 2012-01-22 DIAGNOSIS — IMO0001 Reserved for inherently not codable concepts without codable children: Secondary | ICD-10-CM

## 2012-01-22 DIAGNOSIS — I10 Essential (primary) hypertension: Secondary | ICD-10-CM

## 2012-01-22 DIAGNOSIS — R42 Dizziness and giddiness: Secondary | ICD-10-CM | POA: Insufficient documentation

## 2012-01-22 DIAGNOSIS — K219 Gastro-esophageal reflux disease without esophagitis: Secondary | ICD-10-CM

## 2012-01-22 DIAGNOSIS — M797 Fibromyalgia: Secondary | ICD-10-CM

## 2012-01-22 DIAGNOSIS — K2270 Barrett's esophagus without dysplasia: Secondary | ICD-10-CM

## 2012-01-22 MED ORDER — ESOMEPRAZOLE MAGNESIUM 40 MG PO CPDR: 40.0000 mg | DELAYED_RELEASE_CAPSULE | Freq: Every day | ORAL | Status: DC

## 2012-01-22 MED ORDER — GABAPENTIN 300 MG PO CAPS: 300.0000 mg | ORAL_CAPSULE | Freq: Three times a day (TID) | ORAL | Status: DC

## 2012-01-22 MED ORDER — PRAVASTATIN SODIUM 40 MG PO TABS: 40.0000 mg | ORAL_TABLET | Freq: Every day | ORAL | Status: DC

## 2012-01-22 MED ORDER — METHIMAZOLE 5 MG PO TABS: 5.0000 mg | ORAL_TABLET | Freq: Every day | ORAL | Status: DC

## 2012-01-22 MED ORDER — ALBUTEROL SULFATE HFA 108 (90 BASE) MCG/ACT IN AERS: 2.0000 | INHALATION_SPRAY | Freq: Four times a day (QID) | RESPIRATORY_TRACT | Status: DC | PRN

## 2012-01-22 MED ORDER — TRAMADOL-ACETAMINOPHEN 37.5-325 MG PO TABS: 1.0000 | ORAL_TABLET | Freq: Three times a day (TID) | ORAL | Status: DC | PRN

## 2012-01-22 MED ORDER — METOPROLOL TARTRATE 25 MG PO TABS: 25.0000 mg | ORAL_TABLET | Freq: Two times a day (BID) | ORAL | Status: DC

## 2012-01-22 MED ORDER — CLONIDINE HCL 0.1 MG PO TABS: 0.1000 mg | ORAL_TABLET | Freq: Two times a day (BID) | ORAL | Status: DC | PRN

## 2012-01-22 MED ORDER — TRAZODONE HCL 50 MG PO TABS: 50.0000 mg | ORAL_TABLET | Freq: Every day | ORAL | Status: DC

## 2012-01-22 MED ORDER — OXYBUTYNIN CHLORIDE ER 10 MG PO TB24: 10.0000 mg | ORAL_TABLET | Freq: Every day | ORAL | Status: DC

## 2012-01-22 MED ORDER — ERGOCALCIFEROL 1.25 MG (50000 UT) PO CAPS: 50000.0000 [IU] | ORAL_CAPSULE | ORAL | Status: DC

## 2012-01-22 MED ORDER — AMLODIPINE BESYLATE 10 MG PO TABS: 10.0000 mg | ORAL_TABLET | Freq: Every day | ORAL | Status: DC

## 2012-01-22 NOTE — Progress Notes (Signed)
  Subjective:    Patient ID: Brianna Woods, female    DOB: August 30, 1935, 77 y.o.   MRN: [id number]  HPI CC: med refill  Due for medicare wellness visit.  Will return for this.  Has had 2 falls, in bathroom, once coming out of shower, once with dizziness prior.  Denies LOC or presyncope.  Describes vertigo as falling to the side and associated with seeing aura.  Has taken antivert in past, but then told to stop.  Has seen ENT [facility] [place]) - told surgery could fix problem but may cause hearing loss.  Episodes not associated with nausea/vomiting, hearing changes.  Uses walker consistently daily.  Would like shower chair.    Saw OBGYN - Dr. Katrinka Blazing?  Told had uterine fibroids.  No records available.  On premarin cream.  Fibromyalgia acting up - shoulders, arms, knees hurting her - Has cut back on ibuprofen, but recently increased 2/2 joint pains.  Tylenol doesn't work as well as ibuprofen.  Has been on tramadol.  HTN - endorses elevated blood pressures.  Clonidine brings numbers down, but don't stay down.  No CP/tightness, SOB, leg swelling.  Discussed how i never received prior PCP records although pt signed ROI.  Will sign ROI again and call prior office to f/u.  Review of Systems Per HPI    Objective:   Physical Exam  Nursing note and vitals reviewed. Constitutional: She appears well-developed and well-nourished. No distress.  HENT:  Head: Normocephalic and atraumatic.  Mouth/Throat: Uvula is midline, oropharynx is clear and moist and mucous membranes are normal. No oropharyngeal exudate, posterior oropharyngeal edema, posterior oropharyngeal erythema or tonsillar abscesses.  Eyes: Conjunctivae and EOM are normal. Pupils are equal, round, and reactive to light. No scleral icterus.  Neck: Normal range of motion. Neck supple. Carotid bruit is not present.  Cardiovascular: Normal rate, regular rhythm, normal heart sounds and intact distal pulses.   No murmur heard. Pulmonary/Chest:  Effort normal and breath sounds normal. No respiratory distress. She has no wheezes. She has no rales.  Musculoskeletal: She exhibits no edema.  Lymphadenopathy:    She has no cervical adenopathy.  Skin: Skin is warm and dry. No rash noted.  Psychiatric: She has a normal mood and affect.      Assessment & Plan:

## 2012-01-22 NOTE — Assessment & Plan Note (Signed)
No evidence of barrett's on last EGD.  Will remove from problem list.

## 2012-01-22 NOTE — Assessment & Plan Note (Signed)
No records yet - had patient fill ROI to send again to prior PCP. Worsening joint pain recently that pt attributes to fibro.  Requests pain medication. Tries to avoid NSAIDs 2/2 GERD. Will do trial of ultracet.

## 2012-01-22 NOTE — Assessment & Plan Note (Signed)
Recently increased blood pressures, using clonidine pretty regularly. Discussed concern for rebound hypertension. Will increase norvasc to 10 mg daily, reassess, hopeful to decrease clonidine prn use. Pt agrees.

## 2012-01-22 NOTE — Assessment & Plan Note (Signed)
With recent falls.  Filled out shower chair script for patient.  Does use rolling walker regularly

## 2012-01-22 NOTE — Patient Instructions (Signed)
I've refilled all your medicines. Increase norvasc to 10 mg daily. Check with prior PCP about records, I never received them. Return at your convenience fasting for blood work in next few weeks, afterwards for medicare wellness visit. Good to see you today, call us with questions.

## 2012-01-22 NOTE — Assessment & Plan Note (Signed)
Stable on nexium

## 2012-02-02 ENCOUNTER — Other Ambulatory Visit: Payer: Self-pay | Admitting: Family Medicine

## 2012-02-09 ENCOUNTER — Telehealth: Payer: Self-pay | Admitting: Family Medicine

## 2012-02-09 NOTE — Telephone Encounter (Signed)
Noted. Thanks. ?uterine fibroids.  May have her fill ROI for records at medicare wellness visit.

## 2012-02-09 NOTE — Telephone Encounter (Signed)
Patient states that Dr. Sharen Hones wanted to know who her gynocologist is---Dr. Veatrice Kells at [facility].

## 2012-02-10 ENCOUNTER — Other Ambulatory Visit: Payer: Self-pay | Admitting: Family Medicine

## 2012-02-10 DIAGNOSIS — E785 Hyperlipidemia, unspecified: Secondary | ICD-10-CM

## 2012-02-10 DIAGNOSIS — E559 Vitamin D deficiency, unspecified: Secondary | ICD-10-CM | POA: Insufficient documentation

## 2012-02-10 DIAGNOSIS — I10 Essential (primary) hypertension: Secondary | ICD-10-CM

## 2012-02-13 ENCOUNTER — Other Ambulatory Visit: Payer: Medicare HMO

## 2012-02-20 ENCOUNTER — Other Ambulatory Visit: Payer: Self-pay

## 2012-02-20 ENCOUNTER — Other Ambulatory Visit: Payer: Self-pay | Admitting: Family Medicine

## 2012-02-20 MED ORDER — AMLODIPINE BESYLATE 10 MG PO TABS: 10.0000 mg | ORAL_TABLET | Freq: Every day | ORAL | Status: DC

## 2012-02-20 MED ORDER — METOPROLOL TARTRATE 25 MG PO TABS: 25.0000 mg | ORAL_TABLET | Freq: Two times a day (BID) | ORAL | Status: DC

## 2012-02-20 MED ORDER — CLONIDINE HCL 0.1 MG PO TABS: 0.1000 mg | ORAL_TABLET | Freq: Two times a day (BID) | ORAL | Status: DC | PRN

## 2012-02-20 NOTE — Telephone Encounter (Signed)
Brianna Woods with [facility] transferred me to Asher Muir, Curator about multiple refills. Asher Muir said pt's account was closed and her dept does not allow outgoing calls; I would need to contact pt. Patient notified as instructed by telephone to contact [facility] about acct.

## 2012-02-20 NOTE — Telephone Encounter (Signed)
Pt request 30 day supply of Amlodipine,clonidine and lopressor to [facility] while waiting for [facility] acct info corrected; #30 x 1 on each done. Pt also request Ondansetron to [facility].Please advise.

## 2012-02-21 MED ORDER — ONDANSETRON HCL 4 MG PO TABS: 4.0000 mg | ORAL_TABLET | Freq: Three times a day (TID) | ORAL | Status: DC | PRN

## 2012-02-21 NOTE — Telephone Encounter (Signed)
Patient notified as instructed by telephone rx sent to [facility]

## 2012-02-27 ENCOUNTER — Other Ambulatory Visit: Payer: Self-pay | Admitting: *Deleted

## 2012-02-27 MED ORDER — SUCRALFATE 1 G PO TABS: 1.0000 g | ORAL_TABLET | Freq: Four times a day (QID) | ORAL | Status: DC

## 2012-02-27 MED ORDER — RANITIDINE HCL 300 MG PO TABS: 300.0000 mg | ORAL_TABLET | Freq: Every day | ORAL | Status: DC

## 2012-02-27 MED ORDER — CLONIDINE HCL 0.1 MG PO TABS: 0.1000 mg | ORAL_TABLET | Freq: Two times a day (BID) | ORAL | Status: DC | PRN

## 2012-02-27 MED ORDER — METOPROLOL TARTRATE 25 MG PO TABS: 25.0000 mg | ORAL_TABLET | Freq: Two times a day (BID) | ORAL | Status: DC

## 2012-02-27 MED ORDER — TRAMADOL-ACETAMINOPHEN 37.5-325 MG PO TABS: 1.0000 | ORAL_TABLET | Freq: Three times a day (TID) | ORAL | Status: DC | PRN

## 2012-02-27 MED ORDER — AMLODIPINE BESYLATE 10 MG PO TABS: 10.0000 mg | ORAL_TABLET | Freq: Every day | ORAL | Status: DC

## 2012-02-27 NOTE — Telephone Encounter (Signed)
Sent in

## 2012-02-27 NOTE — Telephone Encounter (Signed)
Refill request for Ultracet received. Not on med list. Ok to refill?

## 2012-03-12 ENCOUNTER — Other Ambulatory Visit: Payer: Self-pay | Admitting: *Deleted

## 2012-03-12 MED ORDER — OXYBUTYNIN CHLORIDE ER 10 MG PO TB24: 10.0000 mg | ORAL_TABLET | Freq: Every day | ORAL | Status: DC

## 2012-03-12 MED ORDER — ESTROGENS, CONJUGATED 0.625 MG/GM VA CREA: 0.5000 g | TOPICAL_CREAM | VAGINAL | Status: DC

## 2012-03-12 NOTE — Telephone Encounter (Signed)
Pt said still has not gotten refills from [facility]. Pt to call [facility] again about acct.

## 2012-03-29 ENCOUNTER — Other Ambulatory Visit: Payer: Self-pay

## 2012-03-29 MED ORDER — ALBUTEROL SULFATE HFA 108 (90 BASE) MCG/ACT IN AERS: 2.0000 | INHALATION_SPRAY | Freq: Four times a day (QID) | RESPIRATORY_TRACT | Status: DC | PRN

## 2012-03-29 MED ORDER — TRAMADOL-ACETAMINOPHEN 37.5-325 MG PO TABS: 1.0000 | ORAL_TABLET | Freq: Three times a day (TID) | ORAL | Status: DC | PRN

## 2012-03-29 MED ORDER — TRAZODONE HCL 50 MG PO TABS: 50.0000 mg | ORAL_TABLET | Freq: Every day | ORAL | Status: DC

## 2012-03-29 MED ORDER — METHIMAZOLE 5 MG PO TABS: 5.0000 mg | ORAL_TABLET | Freq: Every day | ORAL | Status: DC

## 2012-03-29 MED ORDER — GABAPENTIN 300 MG PO CAPS: 300.0000 mg | ORAL_CAPSULE | Freq: Three times a day (TID) | ORAL | Status: DC

## 2012-03-29 NOTE — Telephone Encounter (Signed)
[facility]  plz notify pt and to let us know if wants sent elsewhere.

## 2012-03-29 NOTE — Telephone Encounter (Signed)
Patient notified

## 2012-03-29 NOTE — Telephone Encounter (Signed)
Pt request refill on Albuteral inhaler, Methimazole, gabapentin, Trazodone, and Tramadol to [facility]. Pt almost out of med and having problems getting med from [facility]. Pt to contact [facility] about refills and [facility] to contact Dr Sharen Hones office about refills if needed.

## 2012-03-31 ENCOUNTER — Other Ambulatory Visit: Payer: Self-pay | Admitting: Family Medicine

## 2012-04-19 ENCOUNTER — Other Ambulatory Visit: Payer: Self-pay

## 2012-04-19 MED ORDER — ESOMEPRAZOLE MAGNESIUM 40 MG PO CPDR: 40.0000 mg | DELAYED_RELEASE_CAPSULE | Freq: Every day | ORAL | Status: DC

## 2012-04-19 MED ORDER — PRAVASTATIN SODIUM 40 MG PO TABS: 40.0000 mg | ORAL_TABLET | Freq: Every day | ORAL | Status: DC

## 2012-04-19 MED ORDER — TRAZODONE HCL 50 MG PO TABS: 50.0000 mg | ORAL_TABLET | Freq: Every day | ORAL | Status: DC

## 2012-04-19 MED ORDER — CLONIDINE HCL 0.1 MG PO TABS: 0.1000 mg | ORAL_TABLET | Freq: Two times a day (BID) | ORAL | Status: DC | PRN

## 2012-04-19 MED ORDER — ALBUTEROL SULFATE HFA 108 (90 BASE) MCG/ACT IN AERS: 2.0000 | INHALATION_SPRAY | Freq: Four times a day (QID) | RESPIRATORY_TRACT | Status: DC | PRN

## 2012-04-19 MED ORDER — METHIMAZOLE 5 MG PO TABS: 5.0000 mg | ORAL_TABLET | Freq: Every day | ORAL | Status: DC

## 2012-04-19 MED ORDER — OXYBUTYNIN CHLORIDE ER 10 MG PO TB24: 10.0000 mg | ORAL_TABLET | Freq: Every day | ORAL | Status: DC

## 2012-04-19 MED ORDER — GABAPENTIN 300 MG PO CAPS: 300.0000 mg | ORAL_CAPSULE | Freq: Three times a day (TID) | ORAL | Status: DC

## 2012-04-19 MED ORDER — ONDANSETRON HCL 4 MG PO TABS: 4.0000 mg | ORAL_TABLET | Freq: Two times a day (BID) | ORAL | Status: DC | PRN

## 2012-04-19 MED ORDER — TRAMADOL-ACETAMINOPHEN 37.5-325 MG PO TABS: 1.0000 | ORAL_TABLET | Freq: Three times a day (TID) | ORAL | Status: DC | PRN

## 2012-04-19 MED ORDER — RANITIDINE HCL 300 MG PO TABS: 300.0000 mg | ORAL_TABLET | Freq: Every day | ORAL | Status: DC

## 2012-04-19 MED ORDER — FERROUS SULFATE 325 (65 FE) MG PO TABS: 325.0000 mg | ORAL_TABLET | Freq: Every day | ORAL | Status: DC

## 2012-04-19 MED ORDER — ERGOCALCIFEROL 1.25 MG (50000 UT) PO CAPS: 50000.0000 [IU] | ORAL_CAPSULE | ORAL | Status: DC

## 2012-04-19 MED ORDER — SUCRALFATE 1 G PO TABS: 1.0000 g | ORAL_TABLET | Freq: Four times a day (QID) | ORAL | Status: DC

## 2012-04-19 MED ORDER — AMLODIPINE BESYLATE 10 MG PO TABS: 10.0000 mg | ORAL_TABLET | Freq: Every day | ORAL | Status: DC

## 2012-04-19 MED ORDER — METOPROLOL TARTRATE 25 MG PO TABS: 25.0000 mg | ORAL_TABLET | Freq: Two times a day (BID) | ORAL | Status: DC

## 2012-04-19 NOTE — Telephone Encounter (Signed)
Brianna Woods Case manager with [facility] and pt are conferenced on call to our office. [facility] did not get any additional refills sent this year. I explained not all meds were given refills and some med was sent to local pharmacy. Pt wants all med sent to [facility]. Pt is not out of med. Pt said she will call for appt when she can.Please advise.

## 2012-04-19 NOTE — Telephone Encounter (Signed)
Sent in.  plz notify pt. Let's decrease iron to 1 pill daily. Some I haven't refilled for 1 year (zofran) because they're PRN meds, others because she will need blood work prior to refilling for 1 year (methimazole)

## 2012-04-22 NOTE — Telephone Encounter (Signed)
Patient notified. Appt scheduled.

## 2012-04-26 ENCOUNTER — Other Ambulatory Visit: Payer: Self-pay | Admitting: Family Medicine

## 2012-05-01 ENCOUNTER — Other Ambulatory Visit: Payer: Self-pay | Admitting: Family Medicine

## 2012-05-08 ENCOUNTER — Other Ambulatory Visit: Payer: Self-pay | Admitting: Family Medicine

## 2012-05-17 ENCOUNTER — Encounter: Payer: Self-pay | Admitting: Family Medicine

## 2012-05-17 ENCOUNTER — Ambulatory Visit (INDEPENDENT_AMBULATORY_CARE_PROVIDER_SITE_OTHER): Payer: Medicare HMO | Admitting: Family Medicine

## 2012-05-17 VITALS — BP 124/72 | HR 60 | Temp 98.6°F | Wt 199.0 lb

## 2012-05-17 DIAGNOSIS — R0600 Dyspnea, unspecified: Secondary | ICD-10-CM

## 2012-05-17 DIAGNOSIS — I10 Essential (primary) hypertension: Secondary | ICD-10-CM

## 2012-05-17 DIAGNOSIS — E785 Hyperlipidemia, unspecified: Secondary | ICD-10-CM

## 2012-05-17 DIAGNOSIS — Z8679 Personal history of other diseases of the circulatory system: Secondary | ICD-10-CM

## 2012-05-17 DIAGNOSIS — F410 Panic disorder [episodic paroxysmal anxiety] without agoraphobia: Secondary | ICD-10-CM

## 2012-05-17 DIAGNOSIS — R42 Dizziness and giddiness: Secondary | ICD-10-CM

## 2012-05-17 DIAGNOSIS — Z8673 Personal history of transient ischemic attack (TIA), and cerebral infarction without residual deficits: Secondary | ICD-10-CM

## 2012-05-17 DIAGNOSIS — E559 Vitamin D deficiency, unspecified: Secondary | ICD-10-CM

## 2012-05-17 DIAGNOSIS — Z9889 Other specified postprocedural states: Secondary | ICD-10-CM

## 2012-05-17 DIAGNOSIS — E042 Nontoxic multinodular goiter: Secondary | ICD-10-CM

## 2012-05-17 DIAGNOSIS — R0609 Other forms of dyspnea: Secondary | ICD-10-CM

## 2012-05-17 LAB — COMPREHENSIVE METABOLIC PANEL
ALT: 10 U/L (ref 0–35)
AST: 19 U/L (ref 0–37)
Alkaline Phosphatase: 92 U/L (ref 39–117)
Chloride: 106 mEq/L (ref 96–112)
Creatinine, Ser: 1 mg/dL (ref 0.4–1.2)
Total Bilirubin: 0.6 mg/dL (ref 0.3–1.2)

## 2012-05-17 LAB — CBC WITH DIFFERENTIAL/PLATELET
Basophils Relative: 0.3 % (ref 0.0–3.0)
Eosinophils Absolute: 0.1 10*3/uL (ref 0.0–0.7)
HCT: 33.3 % — ABNORMAL LOW (ref 36.0–46.0)
Hemoglobin: 10.8 g/dL — ABNORMAL LOW (ref 12.0–15.0)
MCHC: 32.5 g/dL (ref 30.0–36.0)
MCV: 85.9 fl (ref 78.0–100.0)
Monocytes Absolute: 0.5 10*3/uL (ref 0.1–1.0)
Neutro Abs: 4.3 10*3/uL (ref 1.4–7.7)
RBC: 3.87 Mil/uL (ref 3.87–5.11)

## 2012-05-17 LAB — LIPID PANEL
HDL: 53.9 mg/dL (ref >39.00)
LDL Cholesterol: 127 mg/dL — ABNORMAL HIGH (ref 0–99)
Total CHOL/HDL Ratio: 4
Triglycerides: 87 mg/dL (ref 0.0–149.0)

## 2012-05-17 NOTE — Patient Instructions (Addendum)
Lets start taking amlodipine 10mg  daily in morning. Continue lopressor 25mg  twice daily.  Continue clonidine as needed. Keep an eye on blood pressure - if staying consistently elevated let me know.  Give me a call in 1-2 weeks with your blood pressure readings with taking amlodipine in the morning. Blood work today as you're fasting.

## 2012-05-17 NOTE — Progress Notes (Signed)
Subjective:    Patient ID: Brianna Woods, female    DOB: 1935/07/22, 77 y.o.   MRN: [id number]  HPI CC: dizziness, stress  Here for medicare wellness visit but form not filled out, and has several concerns today so converted to acute visit  HTN - endorses very labile blood pressures.  For example, yesterday at noon 170/106 and HR 115 - usually at noon time much higher.  In morning well controlled.  At night time - pretty well controlled.  Takes lopressor 25mg  in am and again in pm, and amlodipine 10mg  at noon.  Takes clonidine prn elevated BPs, taking pretty regularly.  "I think I had a TIA".  Has been very emotional because of situation with son and his GF.  Son's girlfriend continues calling patient which makes her very angry and anxious.  Apparently poor relationship with her.  3 wks ago very stressed about this - blood pressure shot up to 191 systolic, HR up to 115.  Did have headache associated with this.  Controlling with acetaminophen.  "nerves shot", very stressed with this situation ever since.  Denies chest pain.  Some shortness of breath and chest tightness when having anxiety episodes.  No fevers/chills.  Denies LOC or vertigo.  Some dizziness with episodes, described more as lightheadedness.  No presyncope.  Endorses aura prior to her TIAs in past.  This feels like that, esp when blood pressure high.  No outright unilateral weakness, numbness.  Reviewed records from prior PCP, no colonoscopy report sent.  Wt Readings from Last 3 Encounters:  05/17/12 199 lb (90.266 kg)  01/22/12 190 lb 8 oz (86.41 kg)  09/28/11 184 lb 12 oz (83.802 kg)   BP Readings from Last 3 Encounters:  05/17/12 124/72  01/22/12 126/78  09/28/11 138/82    Past Medical History  Diagnosis Date  . HLD (hyperlipidemia)   . HTN (hypertension)   . Fibromyalgia     per pt, no records of this   . History of ulcer disease     per pt, no records of this  . History of nephrolithiasis     s/p surgery R kidney  (9mm) 11/2009  . Mixed incontinence     on ditropan  . GERD (gastroesophageal reflux disease)     and esoph stricture s/p dilation 2008  . Barrett's esophagus     on EGD 2008, EGD WNL 2013  . Multinodular goiter     h/o toxic, on methimazole  . Asthma     per pt  . History of CVA (cerebrovascular accident) 2008    "I've had several TIAs"  . Anemia   . History of chicken pox   . History of cardiac murmur   . Vitamin d deficiency   . Arthritis   . History of right bundle branch block 2011  . Anxiety and depression     was on prozac then effexor    Past Surgical History  Procedure Date  . Cholecystectomy 2006  . Cataract extraction 1990    w/ implants  . Lithotripsy 2011    R kidney  . Esophagogastroduodenoscopy 11/2011    LA grade A esophagitis lower 1/3, dilated, med HH, o/w WNL - path: + GERD, no barrett's - f/u 11/2016  . Dexa    Review of Systems Per HPI    Objective:   Physical Exam  Nursing note and vitals reviewed. Constitutional: She is oriented to person, place, and time. She appears well-developed and well-nourished. No distress.  HENT:  Head: Normocephalic and atraumatic.  Mouth/Throat: Oropharynx is clear and moist. No oropharyngeal exudate.  Eyes: Conjunctivae and EOM are normal. Pupils are equal, round, and reactive to light. No scleral icterus.  Neck: Normal range of motion. Neck supple. Carotid bruit is not present.  Cardiovascular: Normal rate, regular rhythm, normal heart sounds and intact distal pulses.   No murmur heard. Pulmonary/Chest: Effort normal and breath sounds normal. No respiratory distress. She has no wheezes. She has no rales.       Breathes easily, no SOB noted  Musculoskeletal: She exhibits no edema.  Lymphadenopathy:    She has no cervical adenopathy.  Neurological: She is alert and oriented to person, place, and time. She has normal strength. No cranial nerve deficit or sensory deficit.       CN 2-12, normal FTN, somewhat unsteady on  feet  Skin: Skin is warm and dry. No rash noted.  Psychiatric: She has a normal mood and affect.       Somewhat anxious       Assessment & Plan:

## 2012-05-18 ENCOUNTER — Encounter: Payer: Self-pay | Admitting: Family Medicine

## 2012-05-18 DIAGNOSIS — F410 Panic disorder [episodic paroxysmal anxiety] without agoraphobia: Secondary | ICD-10-CM | POA: Insufficient documentation

## 2012-05-18 DIAGNOSIS — F418 Other specified anxiety disorders: Secondary | ICD-10-CM | POA: Insufficient documentation

## 2012-05-18 DIAGNOSIS — Z8679 Personal history of other diseases of the circulatory system: Secondary | ICD-10-CM | POA: Insufficient documentation

## 2012-05-18 NOTE — Assessment & Plan Note (Signed)
Check TSH today

## 2012-05-18 NOTE — Assessment & Plan Note (Signed)
sounds like metoprolol is not providing sufficient antihypertensive coverage in am as noon time BPs elevated.  I have asked her to start taking amlodipine 10mg  in am with metoprolol dose. If AM BPs start rising, consider increasing PM metoprolol to 50mg .  Did not do this today as HR 60.

## 2012-05-18 NOTE — Assessment & Plan Note (Signed)
Pt endorses what sounds like anxiety attacks leading to elevated blood pressure readings.  See below. H/o anxiety/depression, prior on prozac then effexor.  Will likely recommend restarting. Episodes sound very stress related - discussed this.  Do not sound consistent with TIAs.

## 2012-05-18 NOTE — Assessment & Plan Note (Signed)
Check FLP today.  On pravastatin 40mg  daily. Goal LDL <100 given personal hx CVA. Intolerant to lipitor and zocor in past.

## 2012-05-18 NOTE — Assessment & Plan Note (Signed)
Longstanding issue ,does not sound like acutely changed. Check for reversible causes with blood work today. nonfocal neurological exam today.

## 2012-05-18 NOTE — Assessment & Plan Note (Signed)
Per pt off ASA 2/2 GI issues in past.   Given ho CVA and concern for current sxs, I will recommend she restart enteric coated aspirin 81mg  daily. No carotid bruits noted today, regular cardiac rhythm without murmur today. nonfocal neurological exam today.

## 2012-05-18 NOTE — Assessment & Plan Note (Signed)
recheck today. Pt endorses on 50000 IU weekly.

## 2012-05-25 ENCOUNTER — Other Ambulatory Visit: Payer: Self-pay | Admitting: Family Medicine

## 2012-06-11 LAB — CBC
HCT: 37.2 % (ref 35.0–47.0)
HGB: 12.4 g/dL (ref 12.0–16.0)
MCH: 28.1 pg (ref 26.0–34.0)
MCV: 84 fL (ref 80–100)
RBC: 4.42 10*6/uL (ref 3.80–5.20)

## 2012-06-11 LAB — URINALYSIS, COMPLETE
Bacteria: NONE SEEN
Glucose,UR: NEGATIVE mg/dL (ref 0–75)
Leukocyte Esterase: NEGATIVE
Nitrite: NEGATIVE
Ph: 7 (ref 4.5–8.0)
Protein: NEGATIVE
Specific Gravity: 1.008 (ref 1.003–1.030)
Squamous Epithelial: 1
WBC UR: 1 /HPF (ref 0–5)

## 2012-06-11 LAB — COMPREHENSIVE METABOLIC PANEL
Albumin: 3.9 g/dL (ref 3.4–5.0)
Alkaline Phosphatase: 153 U/L — ABNORMAL HIGH (ref 50–136)
Anion Gap: 10 (ref 7–16)
Bilirubin,Total: 0.6 mg/dL (ref 0.2–1.0)
Calcium, Total: 9.3 mg/dL (ref 8.5–10.1)
Chloride: 103 mmol/L (ref 98–107)
Creatinine: 0.91 mg/dL (ref 0.60–1.30)
EGFR (African American): >60
EGFR (Non-African Amer.): >60
Glucose: 112 mg/dL — ABNORMAL HIGH (ref 65–99)
Osmolality: 280 (ref 275–301)
Potassium: 3 mmol/L — ABNORMAL LOW (ref 3.5–5.1)
SGOT(AST): 15 U/L (ref 15–37)
Sodium: 141 mmol/L (ref 136–145)

## 2012-06-11 LAB — CK TOTAL AND CKMB (NOT AT ARMC): CK, Total: 81 U/L (ref 21–215)

## 2012-06-11 LAB — MAGNESIUM: Magnesium: 1.6 mg/dL — ABNORMAL LOW

## 2012-06-11 LAB — LIPASE, BLOOD: Lipase: 64 U/L — ABNORMAL LOW (ref 73–393)

## 2012-06-11 IMAGING — CR DG CHEST 1V PORT
1 series · 1 of 1 positions shown · non-contrast
Comparison: none

REASON FOR EXAM: abd pain
COMMENTS:

PROCEDURE:     DXR - DXR PORTABLE CHEST SINGLE VIEW  - [DATE]  [DATE]
RESULT:     Cardiomegaly. Lungs clear. No congestive are clear.

[portable]
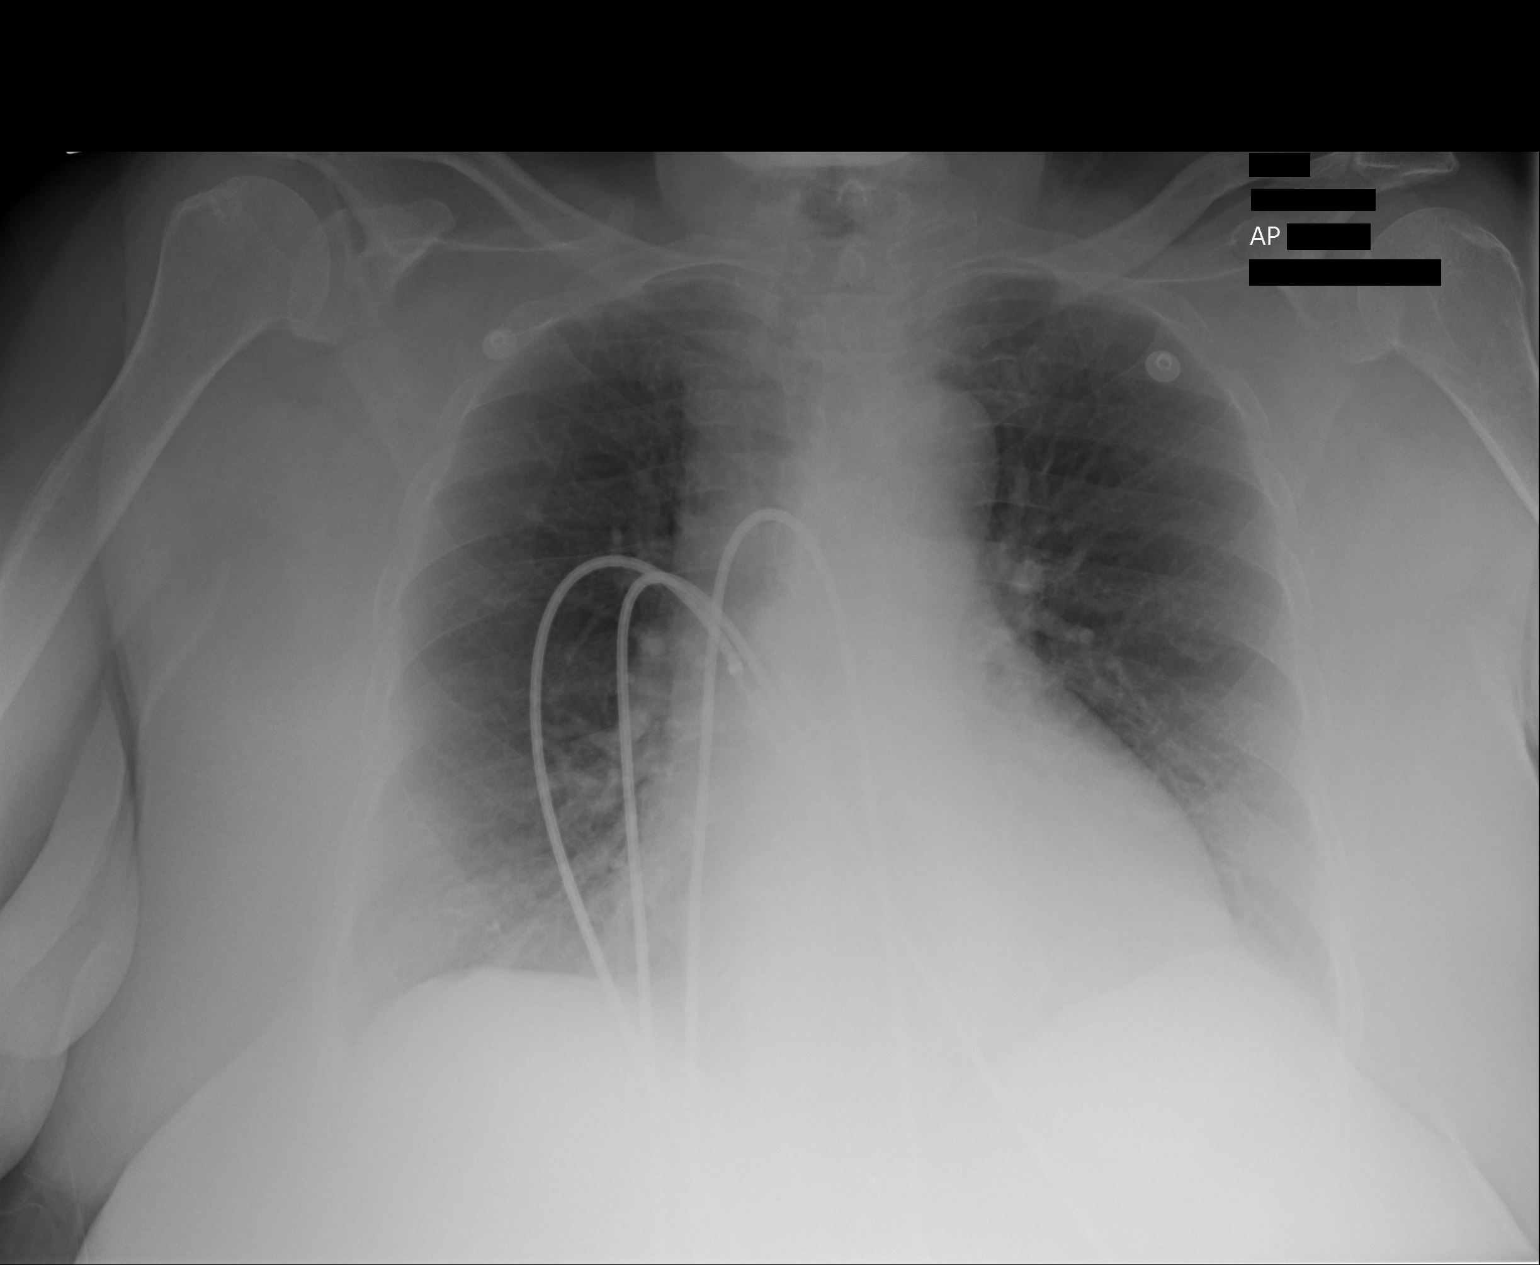

[1 of 1 positions shown; findings below may reference images not displayed]

IMPRESSION: Cardiomegaly. No evidence of congestive heart failure.

## 2012-06-11 IMAGING — CT CT ABD-PELV W/O CM
1 of 2 series · 15 of 32 positions shown, 19 images · non-contrast
Comparison: none

REASON FOR EXAM: (1) abd pain; (2) abd pain
COMMENTS:

PROCEDURE:     CT  - CT ABDOMEN AND PELVIS W[DATE]  [DATE]
RESULT:     History: Pain.
Comparison Study: Ultrasound [DATE]

[Series 2: 3mm soft tissue · axial · 0.80mm/px · z∈[-962,-572]mm · 15 of 142 slices shown, 19 images]
[im 6/142  soft-tissue]
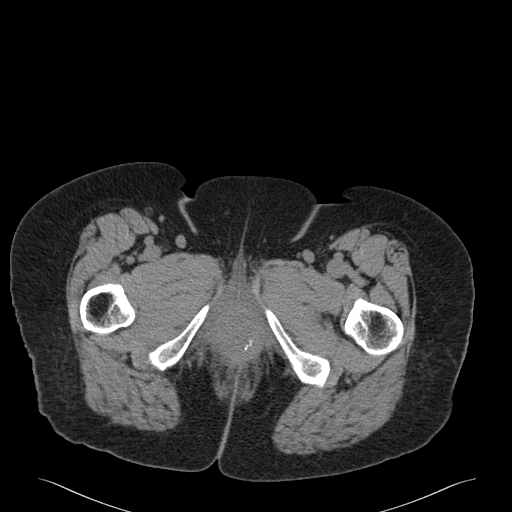
[im 6/142  bone]
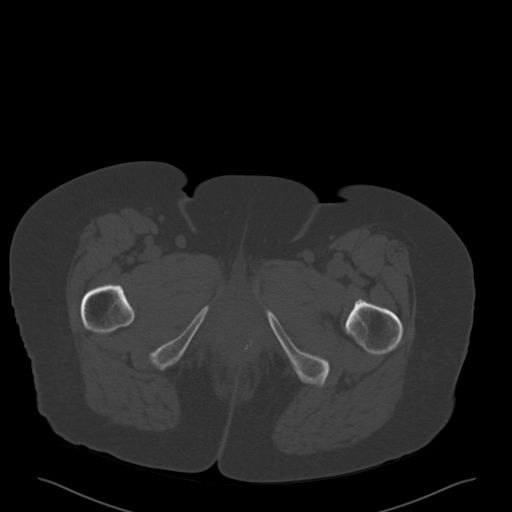
[im 17/142  soft-tissue]
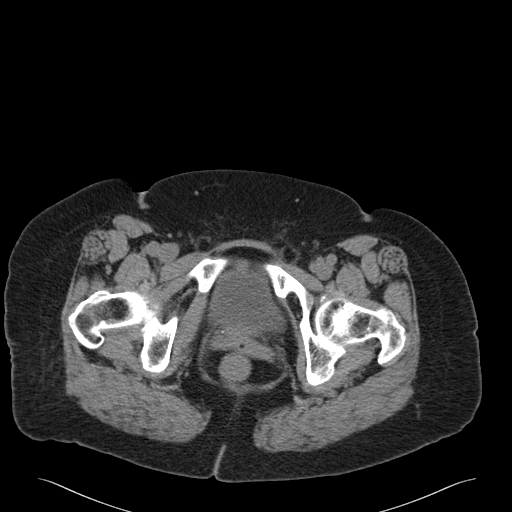
[im 29/142  soft-tissue]
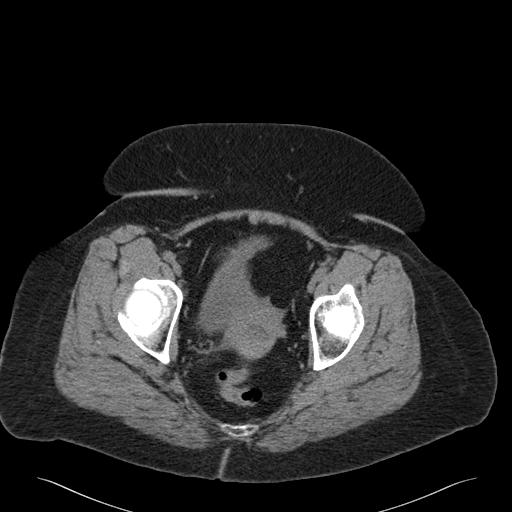
[im 40/142  soft-tissue]
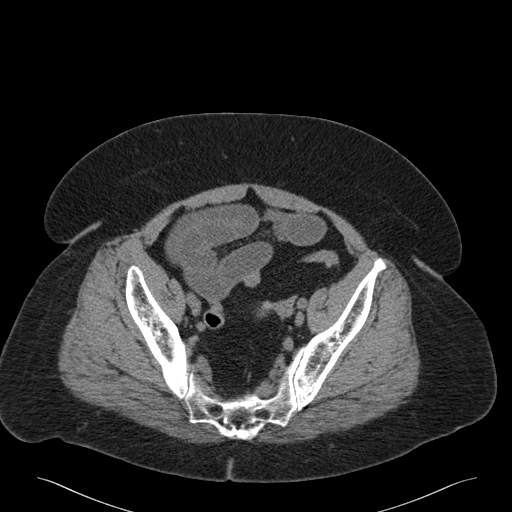
[im 51/142  soft-tissue]
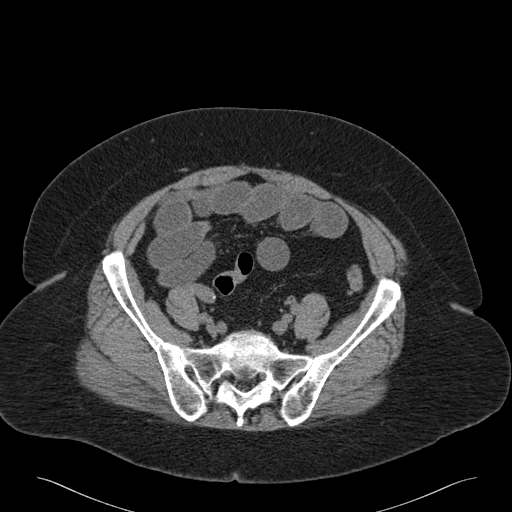
[im 63/142  soft-tissue]
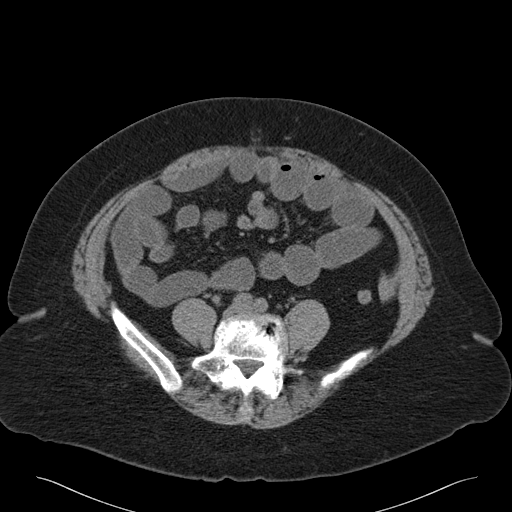
[im 74/142  soft-tissue]
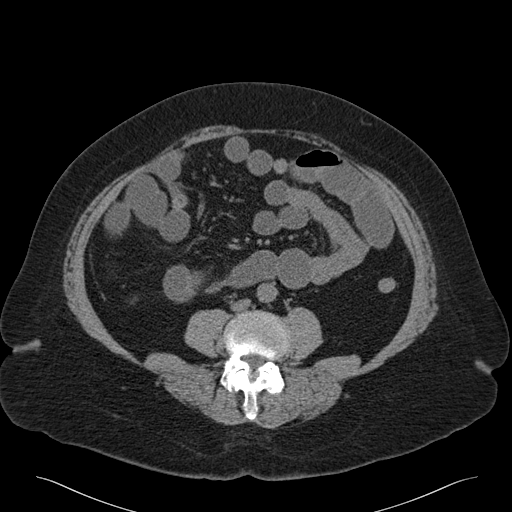
[im 79/142  soft-tissue]
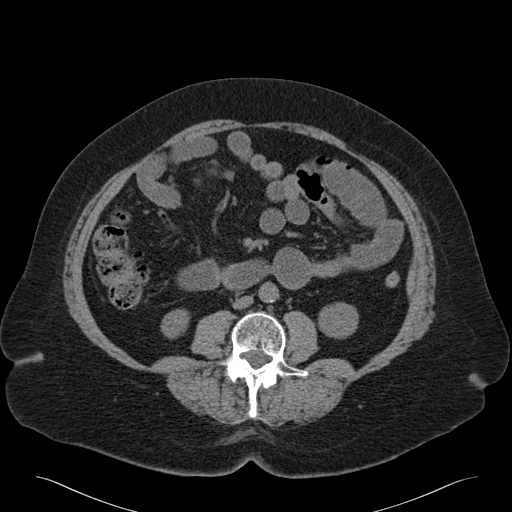
[im 91/142  soft-tissue]
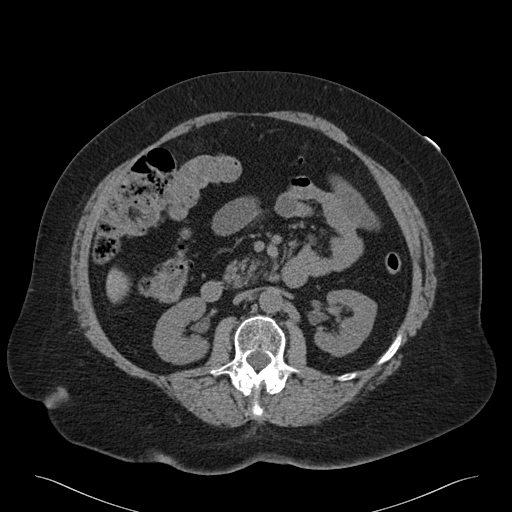
[im 91/142  bone]
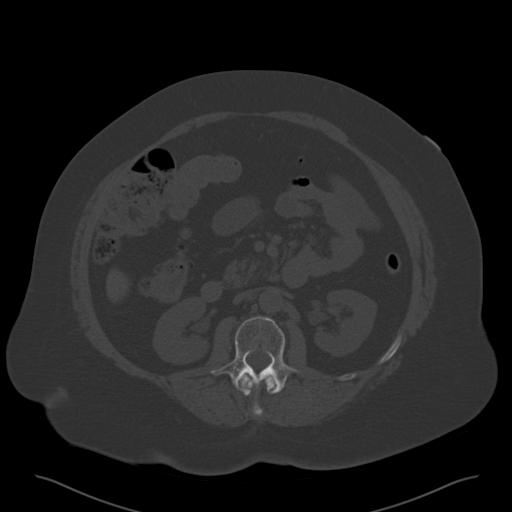
[im 102/142  soft-tissue]
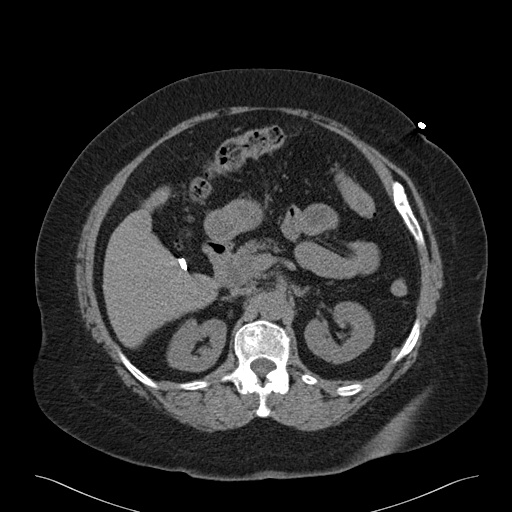
[im 113/142  soft-tissue]
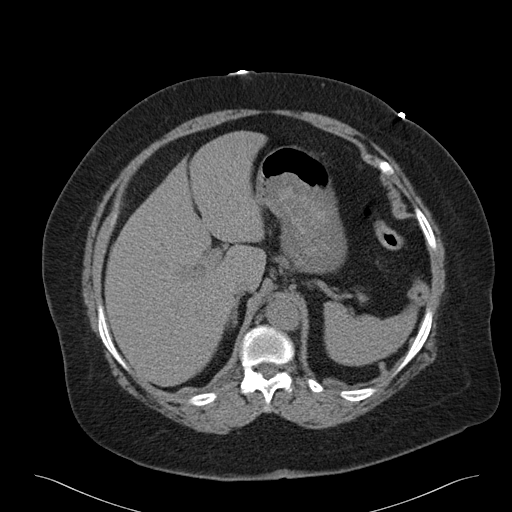
[im 119/142  lung]
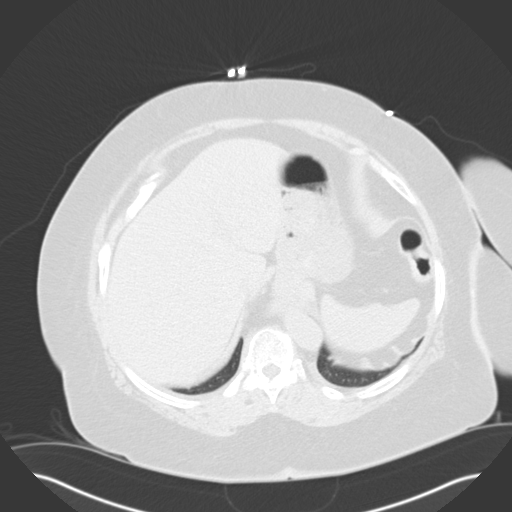
[im 125/142  soft-tissue]
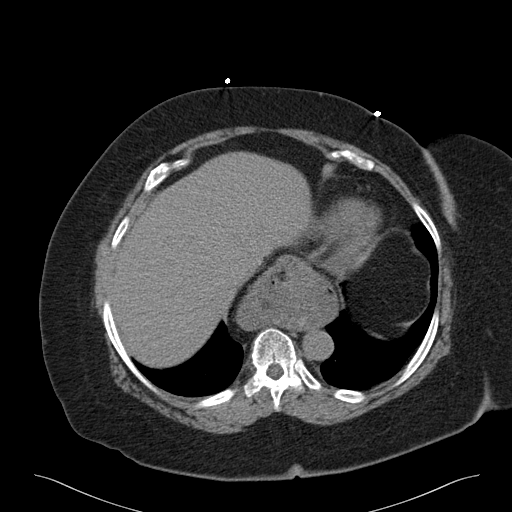
[im 125/142  lung]
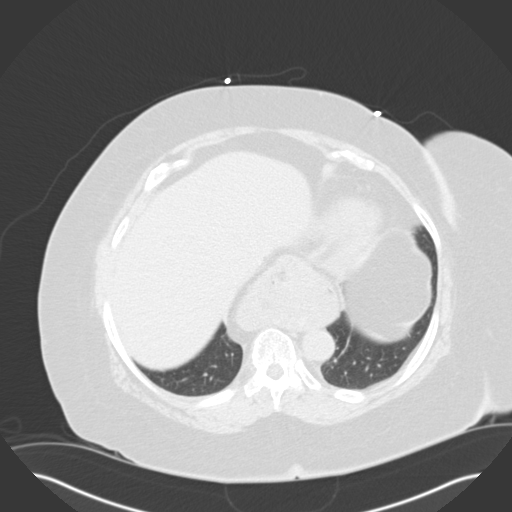
[im 130/142  lung]
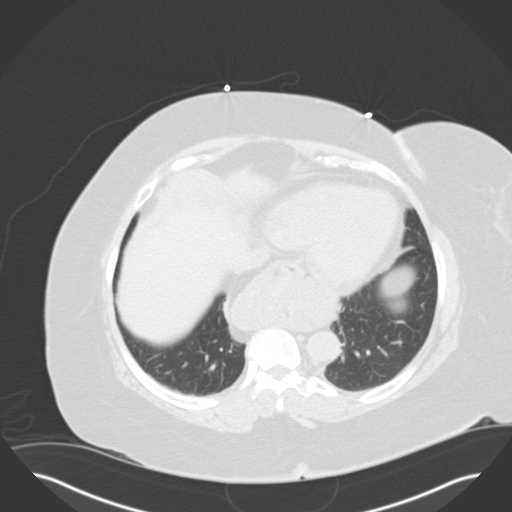
[im 136/142  soft-tissue]
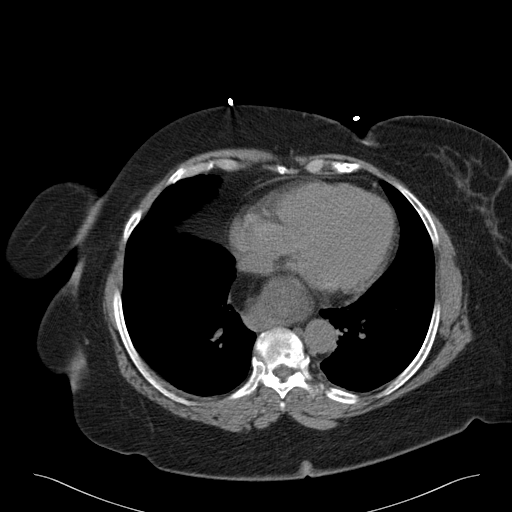
[im 136/142  lung]
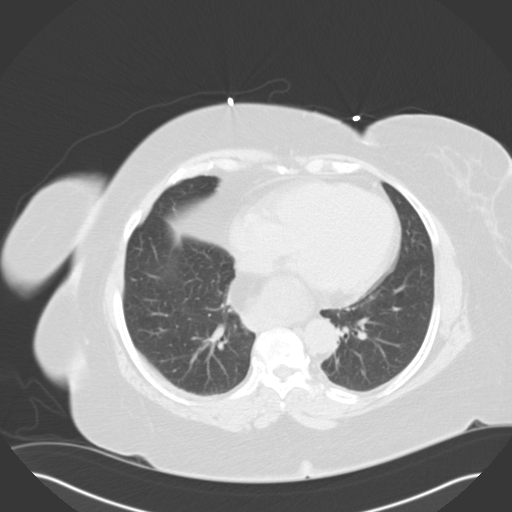

[15 of 32 positions shown; findings below may reference images not displayed]

FINDINGS: Standard nonenhanced CT obtained. Liver normal. Spleen normal.
Pancreas normal. Surgical clips right upper quadrant. Mild biliary ductal
prominence noted. No obstructing abnormalities. This may be from prior
cholecystectomy. Adrenals normal. Calcific stone noted right upper pole
calyx. No hydronephrosis. Aorta nondistended. Appendix normal. Mildly
distended small bowel loops are noted. There is no evidence of small bowel
wall thickening. The colon is nondistended. These findings could be related
to enteritis, adynamic ileus, or developing small bowel obstruction. No
focal point of obstruction is identified. There is approximately 2 cm
low-density lesion in the lower uterine segment. This could represent a
fibroid, however endometrial lesion including malignancy cannot be excluded.
Pelvic ultrasound suggested for further evaluation. Bladder is nondistended.
Phleboliths are noted. No significant adenopathy. No free air.
IMPRESSION: 1. Mildly distended loops of small bowel as described above.
2. 2 cm lesion in the lower uterine segment as described above. Pelvic
ultrasound should be considered for further evaluation.

## 2012-06-12 ENCOUNTER — Observation Stay: Payer: Self-pay | Admitting: Internal Medicine

## 2012-06-12 LAB — TROPONIN I: Troponin-I: <0.02 ng/mL

## 2012-06-13 LAB — COMPREHENSIVE METABOLIC PANEL
Anion Gap: 7 (ref 7–16)
BUN: 3 mg/dL — ABNORMAL LOW (ref 7–18)
Bilirubin,Total: 0.8 mg/dL (ref 0.2–1.0)
Chloride: 107 mmol/L (ref 98–107)
Creatinine: 0.85 mg/dL (ref 0.60–1.30)
EGFR (African American): >60
EGFR (Non-African Amer.): >60
Potassium: 3.6 mmol/L (ref 3.5–5.1)
Total Protein: 7.1 g/dL (ref 6.4–8.2)

## 2012-06-13 LAB — PROTIME-INR: Prothrombin Time: 14.2 secs (ref 11.5–14.7)

## 2012-06-13 LAB — LIPID PANEL
Cholesterol: 220 mg/dL — ABNORMAL HIGH (ref 0–200)
HDL Cholesterol: 54 mg/dL (ref 40–60)
Triglycerides: 70 mg/dL (ref 0–200)

## 2012-06-13 LAB — CBC WITH DIFFERENTIAL/PLATELET
Basophil #: 0 10*3/uL (ref 0.0–0.1)
Basophil %: 0.4 %
Eosinophil #: 0.2 10*3/uL (ref 0.0–0.7)
Eosinophil %: 1.7 %
HCT: 34 % — ABNORMAL LOW (ref 35.0–47.0)
HGB: 11.3 g/dL — ABNORMAL LOW (ref 12.0–16.0)
Lymphocyte #: 2.6 10*3/uL (ref 1.0–3.6)
MCHC: 33.1 g/dL (ref 32.0–36.0)
MCV: 85 fL (ref 80–100)
Monocyte %: 8.5 %
Platelet: 236 10*3/uL (ref 150–440)
WBC: 9.7 10*3/uL (ref 3.6–11.0)

## 2012-06-13 LAB — URINE CULTURE

## 2012-06-17 LAB — CULTURE, BLOOD (SINGLE)

## 2012-06-22 ENCOUNTER — Other Ambulatory Visit: Payer: Self-pay | Admitting: Family Medicine

## 2012-06-26 ENCOUNTER — Encounter: Payer: Medicare HMO | Admitting: Family Medicine

## 2012-06-26 ENCOUNTER — Ambulatory Visit: Payer: Medicare HMO | Admitting: Family Medicine

## 2012-06-28 ENCOUNTER — Ambulatory Visit: Payer: Medicare HMO | Admitting: Family Medicine

## 2012-07-03 ENCOUNTER — Ambulatory Visit (INDEPENDENT_AMBULATORY_CARE_PROVIDER_SITE_OTHER): Payer: Medicare HMO | Admitting: Family Medicine

## 2012-07-03 ENCOUNTER — Encounter: Payer: Self-pay | Admitting: Family Medicine

## 2012-07-03 ENCOUNTER — Ambulatory Visit (INDEPENDENT_AMBULATORY_CARE_PROVIDER_SITE_OTHER)
Admission: RE | Admit: 2012-07-03 | Discharge: 2012-07-03 | Disposition: A | Discharge status: Home / Self Care | Payer: Medicare HMO | Source: Ambulatory Visit | Attending: Family Medicine | Admitting: Family Medicine

## 2012-07-03 VITALS — BP 134/86 | HR 96 | Temp 98.2°F | Wt 196.5 lb

## 2012-07-03 DIAGNOSIS — R1030 Lower abdominal pain, unspecified: Secondary | ICD-10-CM | POA: Insufficient documentation

## 2012-07-03 DIAGNOSIS — R109 Unspecified abdominal pain: Secondary | ICD-10-CM

## 2012-07-03 DIAGNOSIS — R5381 Other malaise: Secondary | ICD-10-CM

## 2012-07-03 DIAGNOSIS — R531 Weakness: Secondary | ICD-10-CM

## 2012-07-03 DIAGNOSIS — I10 Essential (primary) hypertension: Secondary | ICD-10-CM

## 2012-07-03 LAB — COMPREHENSIVE METABOLIC PANEL
AST: 12 U/L (ref 0–37)
Albumin: 3.4 g/dL — ABNORMAL LOW (ref 3.5–5.2)
Alkaline Phosphatase: 79 U/L (ref 39–117)
BUN: 5 mg/dL — ABNORMAL LOW (ref 6–23)
Glucose, Bld: 102 mg/dL — ABNORMAL HIGH (ref 70–99)
Potassium: 3 mEq/L — ABNORMAL LOW (ref 3.5–5.1)
Total Bilirubin: 0.6 mg/dL (ref 0.3–1.2)

## 2012-07-03 LAB — CBC WITH DIFFERENTIAL/PLATELET
Basophils Relative: 0.3 % (ref 0.0–3.0)
Eosinophils Absolute: 0.1 10*3/uL (ref 0.0–0.7)
Eosinophils Relative: 1.3 % (ref 0.0–5.0)
HCT: 35.7 % — ABNORMAL LOW (ref 36.0–46.0)
Hemoglobin: 11.4 g/dL — ABNORMAL LOW (ref 12.0–15.0)
Lymphs Abs: 2 10*3/uL (ref 0.7–4.0)
MCHC: 31.8 g/dL (ref 30.0–36.0)
MCV: 87 fl (ref 78.0–100.0)
Monocytes Absolute: 0.6 10*3/uL (ref 0.1–1.0)
Neutro Abs: 7.3 10*3/uL (ref 1.4–7.7)
Neutrophils Relative %: 72.5 % (ref 43.0–77.0)
RBC: 4.11 Mil/uL (ref 3.87–5.11)
WBC: 10 10*3/uL (ref 4.5–10.5)

## 2012-07-03 LAB — LIPASE: Lipase: 18 U/L (ref 11.0–59.0)

## 2012-07-03 NOTE — Assessment & Plan Note (Addendum)
Generalized weakness after recent hospitalization for enteritis. Has lost 3 lbs in last few weeks, appetite down, not eating well. Pt concerned about living at home alone, I agree given tenuous status - however no good options.  Discussed private company for nurses aide but she doesn't currently need assistance with ADLs. Continue to monitor for now.

## 2012-07-03 NOTE — Patient Instructions (Addendum)
Pass by Marion's office for referral for home health social worker and nursing for further evaluation. Xray today.  Blood work today. Depending on results, we may order another CT scan or I may send you to stomach doctor for further evaluation. Good to see you today, call us with questions.

## 2012-07-03 NOTE — Assessment & Plan Note (Deleted)
Continue regimen of nexium 40mg  QAM, ranitidine 300mg  QHS.

## 2012-07-03 NOTE — Assessment & Plan Note (Signed)
Recently treated for enteritis, completed 7 day course of cipro/flagyl.  There was concern for partial SBO at that time. Continued abd pain, nausea/vomiting, diarrhea. Check acute abd series - no concern for constipation or obstruction. Check blood work again today - including lipase, CMP, CBC, and lactate.   Lactic acid in hospital was elevated - ?mesenteric ischemia.  If again elevated, will obtain CT angiography.  If normal blood work with continued sxs, will refer to GI for further evaluation. Encouraged continue nexium and ranitidine daily. Pt agrees with plan.  Advised if any sudden worsening, to seek urgent care.

## 2012-07-03 NOTE — Progress Notes (Signed)
Subjective:    Patient ID: Brianna Woods, female    DOB: 20-Oct-1934, 77 y.o.   MRN: [id number]  HPI CC: hosp f/u  Ms Brianna Woods is a 77yo with h/o HTN, HLD, fibromyalgia, GERD, history of TIAs per pt report, asthma and h/o RBBB who presents today as hospital follow up for admission for abdominal pain with diarrhea.  Abdominal pain described as colicky 10/10 pain associated with diarrhea but no vomiting.  Concern for enteritis versus partial SBO.  initially treated with IVF, NPO, and IV abx.  Surgery evaluated patient, didn't think SBO evident, followed while in hospital.  Discharge diagnosis was enteritis, treated with cipro and flagyl for total of 7d course.  Since she's been home, having 3-4 loose bowel movements daily, significant nausea and vomiting (food, NBNB).  Diffuse intermittent abd pain with nausea.  Pain worse with meals, improved with vomiting.  Now having problems with hemorrhoids.  Appetite down.  Trying to avoid use of tramadol, but does help with pain when she takes.  Last meal today was at 4am - oatmeal.  Last BM was midnight, loose.  Passing gas fine.  No fevers/chills, no blood in stool.  EGD 11/2011 by Dr. Bluford Kaufmann reviewed - esophagitis and HH.  Pt reports compliance with nexium 40mg  daily and ranitidine 300mg  nightly.  States she's worried about living alone at home, states she doesn't have family nearby.  Lives in senior living in 3 bedroom apartment.  Admission: 06/12/2012 Discharge: 06/13/2012 According to pt, admitted 06/10/2012  Records reviewed.  Asked to scan.   CT abd - evolving enteritis or early partial SBO with large HH, 2cm lesion in lower uterine segment -?fibroid vs other, rec pelvic US ==> pt states saw gyn in March 2013, s/p normal endometrial biopsy, told had calcified fibroid.  Has been present for years.  No need for further workup. CXR - CM without acute process. WBC 19, Hgb 12, K 3.0, Cr 0.9, lipase normal, LA elevated at 2.6, cardiac enzymes x3 negative. TSH low at  0.142 UCx MBM Blcx x2: NG  Wt Readings from Last 3 Encounters:  07/03/12 196 lb 8 oz (89.132 kg)  05/17/12 199 lb (90.266 kg)  01/22/12 190 lb 8 oz (86.41 kg)    Past Medical History  Diagnosis Date  . HLD (hyperlipidemia)   . HTN (hypertension)   . Fibromyalgia     per pt, no records of this   . History of ulcer disease     per pt, no records of this  . History of nephrolithiasis     s/p surgery R kidney (9mm) 11/2009  . Mixed incontinence     on ditropan  . GERD (gastroesophageal reflux disease)     and esoph stricture s/p dilation 2008  . Barrett's esophagus     on EGD 2008, EGD WNL 2013  . Multinodular goiter     h/o toxic, on methimazole  . Asthma     per pt  . History of CVA (cerebrovascular accident) 2008    "I've had several TIAs"  . Anemia   . History of chicken pox   . History of cardiac murmur   . Vitamin D deficiency   . Arthritis   . History of right bundle branch block 2011  . Anxiety and depression     was on prozac then effexor    Review of Systems Per HPI    Objective:   Physical Exam  Nursing note and vitals reviewed. Constitutional: She appears well-developed and well-nourished.  No distress.       uncomfortable  Cardiovascular: Normal rate, regular rhythm, normal heart sounds and intact distal pulses.   No murmur heard. Pulmonary/Chest: Effort normal and breath sounds normal. No respiratory distress. She has no wheezes. She has no rales.  Abdominal: Soft. Bowel sounds are normal. She exhibits no distension and no mass. There is no hepatosplenomegaly. There is generalized tenderness (diffuse throughout abdomen, upper > lower). There is guarding. There is no rigidity, no rebound, no CVA tenderness and negative Murphy's sign.       obese  Musculoskeletal: She exhibits no edema.  Skin: Skin is warm and dry. No rash noted.       Assessment & Plan:

## 2012-07-03 NOTE — Assessment & Plan Note (Signed)
Improved control with change of amlodipine 10mg  to in the mornings.  Continue this. BP Readings from Last 3 Encounters:  07/03/12 134/86  05/17/12 124/72  01/22/12 126/78

## 2012-07-04 ENCOUNTER — Other Ambulatory Visit: Payer: Self-pay | Admitting: Family Medicine

## 2012-07-04 DIAGNOSIS — R109 Unspecified abdominal pain: Secondary | ICD-10-CM

## 2012-07-04 MED ORDER — PROMETHAZINE HCL 25 MG PO TABS: 25.0000 mg | ORAL_TABLET | Freq: Two times a day (BID) | ORAL | Status: DC | PRN

## 2012-07-05 LAB — T4, FREE: Free T4: 1.02 ng/dL (ref 0.60–1.60)

## 2012-07-09 ENCOUNTER — Other Ambulatory Visit: Payer: Self-pay | Admitting: Family Medicine

## 2012-07-09 DIAGNOSIS — E039 Hypothyroidism, unspecified: Secondary | ICD-10-CM

## 2012-07-09 DIAGNOSIS — E038 Other specified hypothyroidism: Secondary | ICD-10-CM

## 2012-07-09 DIAGNOSIS — E876 Hypokalemia: Secondary | ICD-10-CM

## 2012-07-17 ENCOUNTER — Other Ambulatory Visit: Payer: Self-pay

## 2012-07-17 ENCOUNTER — Other Ambulatory Visit (INDEPENDENT_AMBULATORY_CARE_PROVIDER_SITE_OTHER): Payer: Medicare HMO

## 2012-07-17 DIAGNOSIS — E038 Other specified hypothyroidism: Secondary | ICD-10-CM

## 2012-07-17 DIAGNOSIS — E039 Hypothyroidism, unspecified: Secondary | ICD-10-CM

## 2012-07-17 DIAGNOSIS — E876 Hypokalemia: Secondary | ICD-10-CM

## 2012-07-17 LAB — BASIC METABOLIC PANEL
BUN: 7 mg/dL (ref 6–23)
CO2: 28 mEq/L (ref 19–32)
Chloride: 104 mEq/L (ref 96–112)
Creatinine, Ser: 0.9 mg/dL (ref 0.4–1.2)

## 2012-07-17 LAB — T3, FREE: T3, Free: 2.9 pg/mL (ref 2.3–4.2)

## 2012-07-17 MED ORDER — PROMETHAZINE HCL 25 MG PO TABS: 25.0000 mg | ORAL_TABLET | Freq: Two times a day (BID) | ORAL | Status: DC | PRN

## 2012-07-17 NOTE — Telephone Encounter (Signed)
Message left with female who would notify patient.

## 2012-07-17 NOTE — Telephone Encounter (Signed)
Pt request refill phenergan to [facility]; pt continues with rt sided abdominal pain going into back, N&V, diarrhea but no fever. Pt to see GI doctor 07/26/12. Pt spoke with Dr Reece Agar this AM while in lab.

## 2012-07-17 NOTE — Telephone Encounter (Signed)
plz notify sent in. 

## 2012-08-24 ENCOUNTER — Other Ambulatory Visit: Payer: Self-pay | Admitting: Family Medicine

## 2012-09-06 ENCOUNTER — Other Ambulatory Visit: Payer: Self-pay | Admitting: Family Medicine

## 2012-09-11 DIAGNOSIS — K5790 Diverticulosis of intestine, part unspecified, without perforation or abscess without bleeding: Secondary | ICD-10-CM

## 2012-09-11 HISTORY — DX: Diverticulosis of intestine, part unspecified, without perforation or abscess without bleeding: K57.90

## 2012-09-14 ENCOUNTER — Other Ambulatory Visit: Payer: Self-pay | Admitting: Family Medicine

## 2012-10-02 ENCOUNTER — Ambulatory Visit: Payer: Self-pay | Admitting: Gastroenterology

## 2012-10-02 HISTORY — PX: COLONOSCOPY: SHX174

## 2012-10-10 ENCOUNTER — Encounter: Payer: Self-pay | Admitting: Family Medicine

## 2012-10-18 ENCOUNTER — Encounter: Payer: Self-pay | Admitting: Family Medicine

## 2012-11-13 ENCOUNTER — Ambulatory Visit: Payer: Medicare HMO | Admitting: Family Medicine

## 2012-11-20 ENCOUNTER — Encounter: Payer: Self-pay | Admitting: Family Medicine

## 2012-11-20 ENCOUNTER — Ambulatory Visit (INDEPENDENT_AMBULATORY_CARE_PROVIDER_SITE_OTHER): Payer: Medicare HMO | Admitting: Family Medicine

## 2012-11-20 VITALS — BP 144/82 | HR 68 | Temp 98.6°F

## 2012-11-20 DIAGNOSIS — R109 Unspecified abdominal pain: Secondary | ICD-10-CM

## 2012-11-20 DIAGNOSIS — E042 Nontoxic multinodular goiter: Secondary | ICD-10-CM

## 2012-11-20 DIAGNOSIS — I10 Essential (primary) hypertension: Secondary | ICD-10-CM

## 2012-11-20 LAB — CBC WITH DIFFERENTIAL/PLATELET
Basophils Absolute: 0 10*3/uL (ref 0.0–0.1)
Hemoglobin: 11.5 g/dL — ABNORMAL LOW (ref 12.0–15.0)
Lymphocytes Relative: 25.3 % (ref 12.0–46.0)
Monocytes Relative: 5.6 % (ref 3.0–12.0)
Neutro Abs: 5.6 10*3/uL (ref 1.4–7.7)
RDW: 15.3 % — ABNORMAL HIGH (ref 11.5–14.6)

## 2012-11-20 LAB — T4, FREE: Free T4: 0.89 ng/dL (ref 0.60–1.60)

## 2012-11-20 MED ORDER — ESOMEPRAZOLE MAGNESIUM 40 MG PO CPDR: 40.0000 mg | DELAYED_RELEASE_CAPSULE | Freq: Every day | ORAL | Status: DC

## 2012-11-20 MED ORDER — OXYBUTYNIN CHLORIDE ER 10 MG PO TB24: ORAL_TABLET | ORAL | Status: DC

## 2012-11-20 MED ORDER — METOPROLOL TARTRATE 25 MG PO TABS: 25.0000 mg | ORAL_TABLET | Freq: Two times a day (BID) | ORAL | Status: DC

## 2012-11-20 MED ORDER — METHIMAZOLE 5 MG PO TABS: ORAL_TABLET | ORAL | Status: DC

## 2012-11-20 MED ORDER — GABAPENTIN 300 MG PO CAPS: 300.0000 mg | ORAL_CAPSULE | Freq: Three times a day (TID) | ORAL | Status: DC

## 2012-11-20 MED ORDER — CIPROFLOXACIN HCL 500 MG PO TABS: 500.0000 mg | ORAL_TABLET | Freq: Two times a day (BID) | ORAL | Status: DC

## 2012-11-20 MED ORDER — TRAZODONE HCL 100 MG PO TABS: ORAL_TABLET | ORAL | Status: DC

## 2012-11-20 MED ORDER — ESTROGENS, CONJUGATED 0.625 MG/GM VA CREA: 0.5000 g | TOPICAL_CREAM | VAGINAL | Status: DC

## 2012-11-20 MED ORDER — ONDANSETRON HCL 4 MG PO TABS: 4.0000 mg | ORAL_TABLET | Freq: Three times a day (TID) | ORAL | Status: DC | PRN

## 2012-11-20 MED ORDER — ONDANSETRON HCL 4 MG PO TABS: ORAL_TABLET | ORAL | Status: DC

## 2012-11-20 MED ORDER — TRAMADOL-ACETAMINOPHEN 37.5-325 MG PO TABS: 1.0000 | ORAL_TABLET | Freq: Three times a day (TID) | ORAL | Status: DC | PRN

## 2012-11-20 MED ORDER — CLONIDINE HCL 0.1 MG PO TABS: ORAL_TABLET | ORAL | Status: DC

## 2012-11-20 MED ORDER — AMLODIPINE BESYLATE 10 MG PO TABS: 10.0000 mg | ORAL_TABLET | Freq: Every day | ORAL | Status: DC

## 2012-11-20 MED ORDER — PRAVASTATIN SODIUM 40 MG PO TABS: 40.0000 mg | ORAL_TABLET | Freq: Every day | ORAL | Status: DC

## 2012-11-20 NOTE — Progress Notes (Signed)
  Subjective:    Patient ID: Brianna Woods, female    DOB: 1934-10-08, 78 y.o.   MRN: [id number]  HPI CC: follow up  [name] is a 77yo with h/o HTN, HLD, fibromyalgia, GERD, history of TIAs per pt report, asthma and h/o RBBB who presents today with multiple concerns.  Taking caltrate reguarly.  Some indigestion with this.  Recent colonoscopy by Dr. Bluford Kaufmann 10/02/2012 showing sigmoid diverticulosis, otherwise normal.  rec increased fiber in diet.  Avoiding peanuts and popcorn.  Intermittent lower abdominal pain over last 3-4 days.  Some diarrhea with chills but no fevers, constipation.  Insurance won't cover phenergan.  Takes prn nausea, only uses about every few weeks.  requests refill of zofran today.  HTN - No HA, vision changes, CP/tightness, SOB, leg swelling.  Compliant with meds.  Past Medical History  Diagnosis Date  . HLD (hyperlipidemia)   . HTN (hypertension)   . Fibromyalgia     per pt, no records of this   . History of ulcer disease     per pt, no records of this  . History of nephrolithiasis     s/p surgery R kidney (9mm) 11/2009  . Mixed incontinence     on ditropan  . GERD (gastroesophageal reflux disease)     and esoph stricture s/p dilation 2008  . Barrett's esophagus     on EGD 2008, EGD WNL 2013  . Multinodular goiter     h/o toxic, on methimazole  . Asthma     per pt  . History of CVA (cerebrovascular accident) 2008    "I've had several TIAs"  . Anemia   . History of chicken pox   . History of cardiac murmur   . Vitamin D deficiency   . Arthritis   . History of right bundle branch block 2011  . Anxiety and depression     was on prozac then effexor    Past Surgical History  Procedure Laterality Date  . Cholecystectomy  2006  . Cataract extraction  1990    w/ implants  . Lithotripsy  2011    R kidney  . Esophagogastroduodenoscopy  11/2011    LA grade A esophagitis lower 1/3, dilated, med HH, o/w WNL - path: + GERD, no barrett's - f/u 11/2016  . Dexa     no records received  . Colonoscopy  10/02/12    diverticulosis, o/w WNL ([place])   Review of Systems Per HPI    Objective:   Physical Exam  Nursing note and vitals reviewed. Constitutional: She appears well-developed and well-nourished. No distress.  HENT:  Head: Normocephalic and atraumatic.  Mouth/Throat: Oropharynx is clear and moist. No oropharyngeal exudate.  Eyes: Conjunctivae and EOM are normal. Pupils are equal, round, and reactive to light.  Neck: Normal range of motion. Neck supple.  Cardiovascular: Normal rate, regular rhythm, normal heart sounds and intact distal pulses.   No murmur heard. Pulmonary/Chest: Effort normal and breath sounds normal. No respiratory distress. She has no wheezes. She has no rales.  Abdominal: Soft. Bowel sounds are normal. She exhibits no distension and no mass. There is no hepatosplenomegaly. There is tenderness (mild-mod) in the suprapubic area and left lower quadrant. There is no rigidity, no rebound, no guarding and negative Murphy's sign.  obese  Musculoskeletal: She exhibits no edema.       Assessment & Plan:

## 2012-11-20 NOTE — Assessment & Plan Note (Signed)
Chronic, slightly elevated today. No changes.

## 2012-11-20 NOTE — Assessment & Plan Note (Signed)
Subclinical hyperthyroidism - check labwork today. If persistently low TSH, consider referral to endo. H/o multinodular goiter (prior toxic), now on methimazole 5mg  daily.

## 2012-11-20 NOTE — Assessment & Plan Note (Signed)
Recent colonoscopy showing sigmoid diverticulosis - will check CBC today as well as treat for presumed mild case of diverticulitis with cipro 500mg  bid x 7 days. Lab Results  Component Value Date   CREATININE 0.9 07/17/2012  pt agrees with plan, advised to seek urgent care or return here if worsening pain.

## 2012-11-20 NOTE — Patient Instructions (Signed)
We may try increased trazodone dose - 100mg  1/2 to 1 tablet at night as needed for sleep. Blood work today - we will call you with results. Take cipro twice daily for 7 days for abdominal pain - if worsening, please seek urgent care or return to see [place].  Change to liquid diet.

## 2012-11-21 ENCOUNTER — Other Ambulatory Visit: Payer: Self-pay | Admitting: Family Medicine

## 2012-11-21 DIAGNOSIS — E042 Nontoxic multinodular goiter: Secondary | ICD-10-CM

## 2012-11-21 LAB — T3: T3, Total: 102.2 ng/dL (ref 80.0–204.0)

## 2012-11-21 MED ORDER — METHIMAZOLE 10 MG PO TABS: 10.0000 mg | ORAL_TABLET | Freq: Every day | ORAL | Status: DC

## 2012-12-04 ENCOUNTER — Other Ambulatory Visit: Payer: Self-pay | Admitting: Family Medicine

## 2013-01-02 ENCOUNTER — Other Ambulatory Visit: Payer: Medicare HMO

## 2013-01-07 IMAGING — CR DG ABDOMEN 2V
2 series · 2 of 2 positions shown · non-contrast
Comparison: None.

CLINICAL DATA: Abdominal pain

ABDOMEN - 2 VIEW

[view not recorded (1 of 2)]
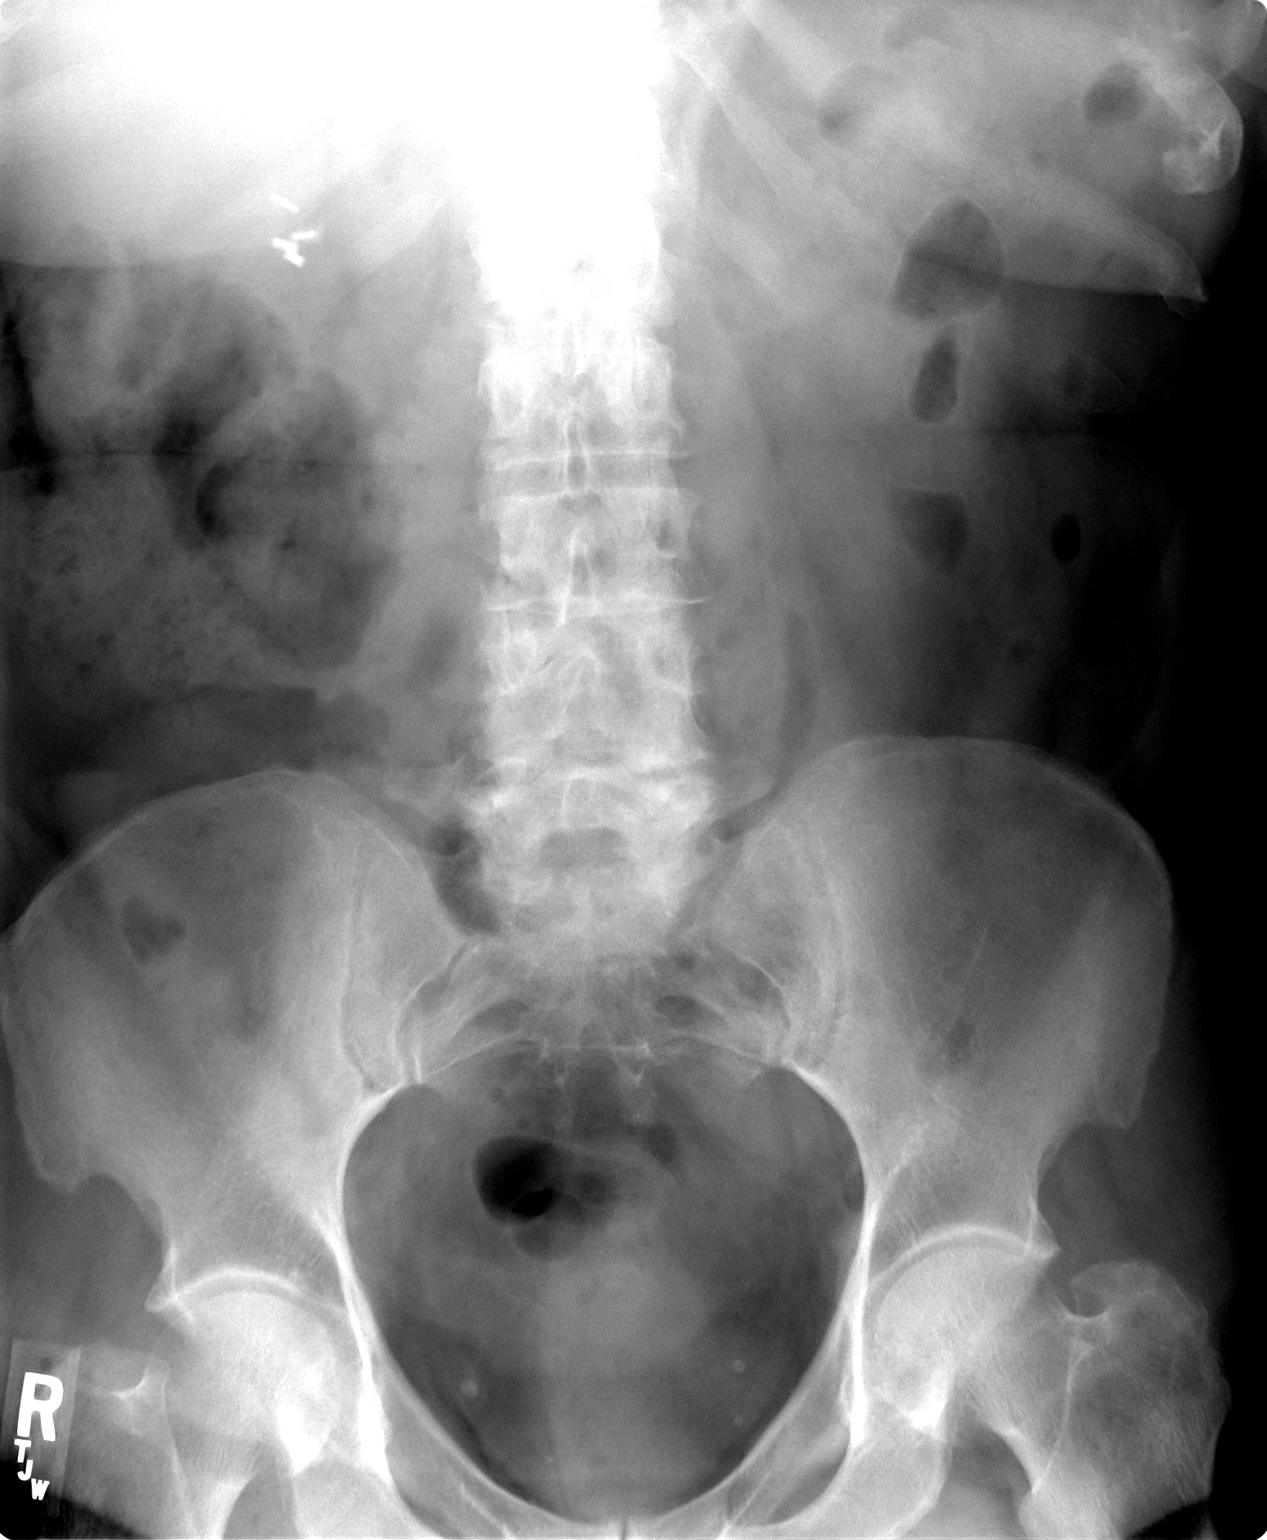

[view not recorded (2 of 2)]
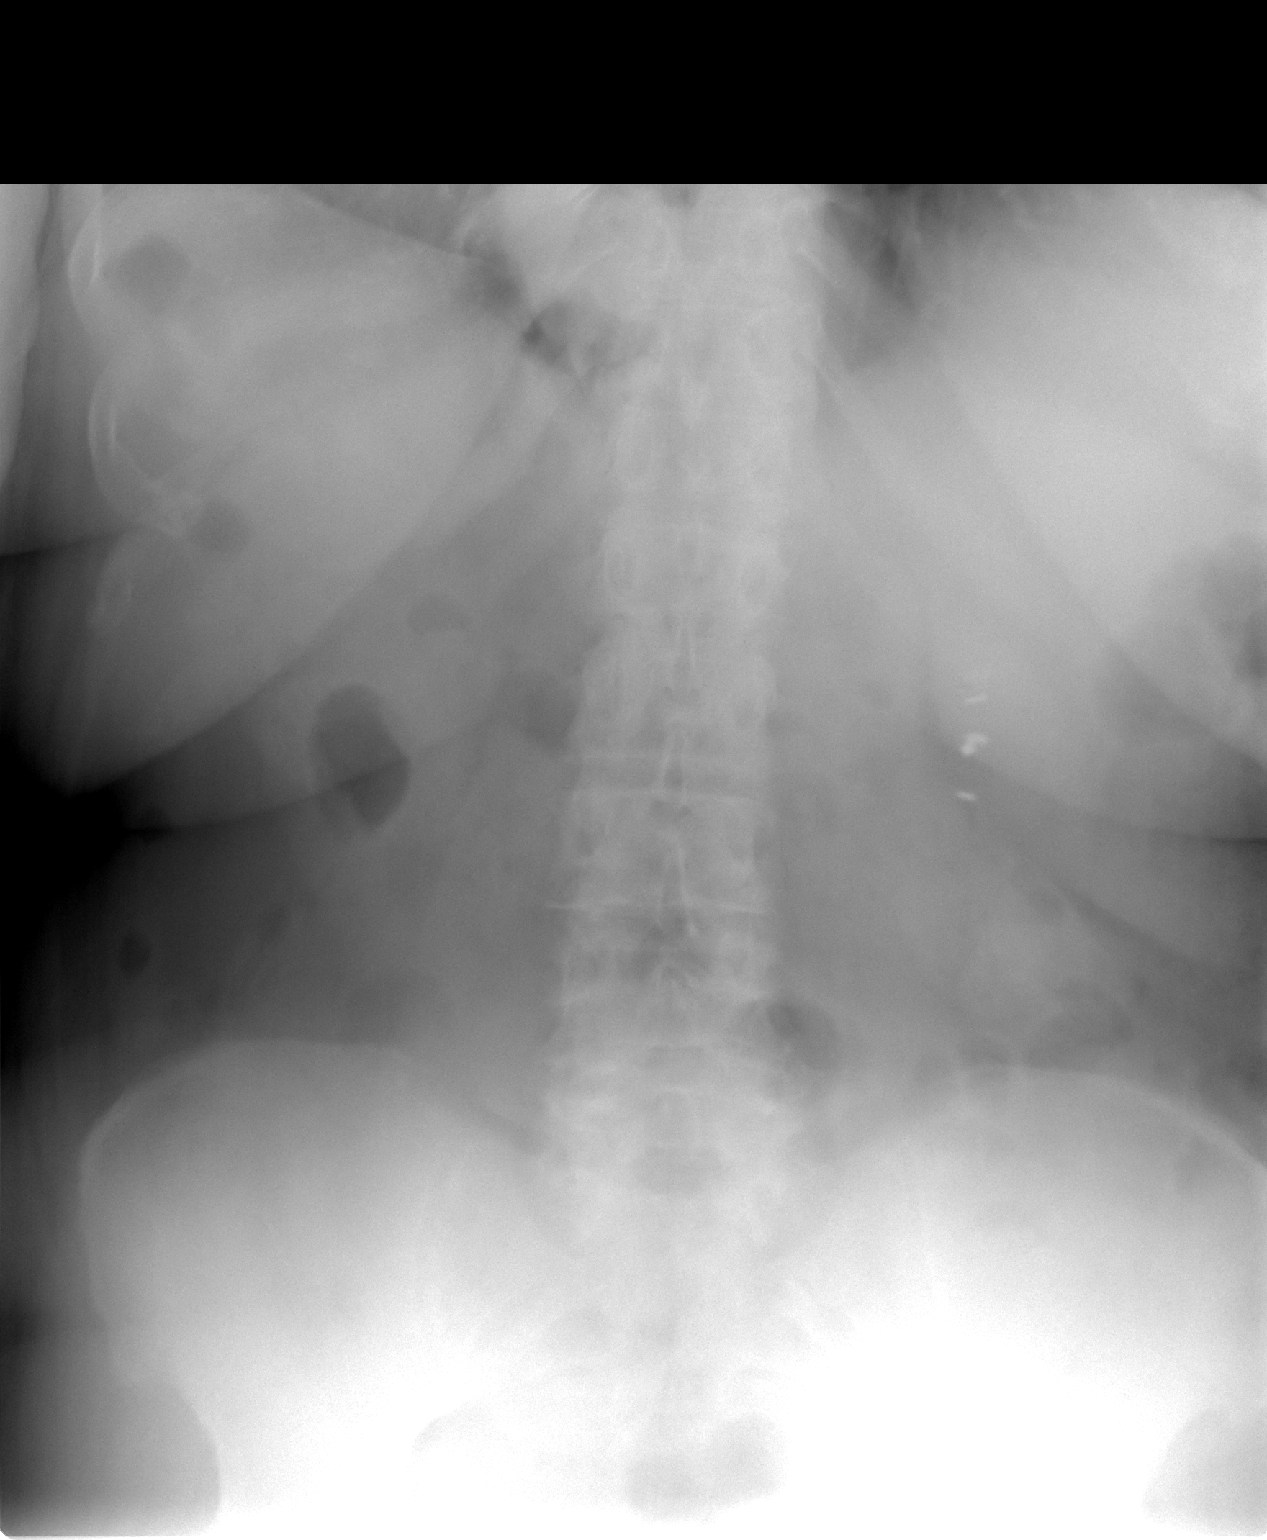

[2 of 2 positions shown; findings below may reference images not displayed]

FINDINGS: Normal bowel gas pattern.  Negative for ileus or bowel
obstruction.  No free air.  Clips in the gallbladder fossa.  No
renal calculi.  No acute bony abnormality.
IMPRESSION: No acute abnormality.

## 2013-03-26 ENCOUNTER — Ambulatory Visit: Payer: Medicare HMO | Admitting: Family Medicine

## 2013-04-01 ENCOUNTER — Ambulatory Visit (INDEPENDENT_AMBULATORY_CARE_PROVIDER_SITE_OTHER): Payer: Medicare HMO | Admitting: Family Medicine

## 2013-04-01 ENCOUNTER — Encounter: Payer: Self-pay | Admitting: Family Medicine

## 2013-04-01 VITALS — BP 126/78 | HR 64 | Temp 98.1°F | Wt 198.2 lb

## 2013-04-01 DIAGNOSIS — E559 Vitamin D deficiency, unspecified: Secondary | ICD-10-CM

## 2013-04-01 DIAGNOSIS — E042 Nontoxic multinodular goiter: Secondary | ICD-10-CM

## 2013-04-01 DIAGNOSIS — F418 Other specified anxiety disorders: Secondary | ICD-10-CM

## 2013-04-01 DIAGNOSIS — K219 Gastro-esophageal reflux disease without esophagitis: Secondary | ICD-10-CM

## 2013-04-01 DIAGNOSIS — D509 Iron deficiency anemia, unspecified: Secondary | ICD-10-CM

## 2013-04-01 DIAGNOSIS — F410 Panic disorder [episodic paroxysmal anxiety] without agoraphobia: Secondary | ICD-10-CM

## 2013-04-01 DIAGNOSIS — R5381 Other malaise: Secondary | ICD-10-CM

## 2013-04-01 DIAGNOSIS — R42 Dizziness and giddiness: Secondary | ICD-10-CM

## 2013-04-01 DIAGNOSIS — I10 Essential (primary) hypertension: Secondary | ICD-10-CM

## 2013-04-01 DIAGNOSIS — E785 Hyperlipidemia, unspecified: Secondary | ICD-10-CM

## 2013-04-01 DIAGNOSIS — F341 Dysthymic disorder: Secondary | ICD-10-CM

## 2013-04-01 DIAGNOSIS — R531 Weakness: Secondary | ICD-10-CM

## 2013-04-01 LAB — CBC WITH DIFFERENTIAL/PLATELET
Eosinophils Relative: 1 % (ref 0.0–5.0)
HCT: 35.4 % — ABNORMAL LOW (ref 36.0–46.0)
Hemoglobin: 11.6 g/dL — ABNORMAL LOW (ref 12.0–15.0)
Lymphs Abs: 2.3 10*3/uL (ref 0.7–4.0)
MCV: 90.5 fl (ref 78.0–100.0)
Monocytes Absolute: 0.6 10*3/uL (ref 0.1–1.0)
Neutro Abs: 8.4 10*3/uL — ABNORMAL HIGH (ref 1.4–7.7)
Platelets: 275 10*3/uL (ref 150.0–400.0)
RDW: 17 % — ABNORMAL HIGH (ref 11.5–14.6)
WBC: 11.5 10*3/uL — ABNORMAL HIGH (ref 4.5–10.5)

## 2013-04-01 LAB — LIPID PANEL
Cholesterol: 259 mg/dL — ABNORMAL HIGH (ref 0–200)
Triglycerides: 157 mg/dL — ABNORMAL HIGH (ref 0.0–149.0)

## 2013-04-01 LAB — BASIC METABOLIC PANEL
BUN: 17 mg/dL (ref 6–23)
CO2: 29 mEq/L (ref 19–32)
Calcium: 9.8 mg/dL (ref 8.4–10.5)
Creatinine, Ser: 0.8 mg/dL (ref 0.4–1.2)
Glucose, Bld: 91 mg/dL (ref 70–99)

## 2013-04-01 LAB — LDL CHOLESTEROL, DIRECT: Direct LDL: 183.3 mg/dL

## 2013-04-01 LAB — TSH: TSH: 0.7 u[IU]/mL (ref 0.35–5.50)

## 2013-04-01 LAB — T3, FREE: T3, Free: 2.6 pg/mL (ref 2.3–4.2)

## 2013-04-01 LAB — FERRITIN: Ferritin: 14.9 ng/mL (ref 10.0–291.0)

## 2013-04-01 MED ORDER — ONDANSETRON HCL 4 MG PO TABS: 4.0000 mg | ORAL_TABLET | Freq: Three times a day (TID) | ORAL | Status: DC | PRN

## 2013-04-01 NOTE — Progress Notes (Signed)
  Subjective:    Patient ID: Brianna Woods, female    DOB: 12-03-1934, 78 y.o.   MRN: [id number]  HPI CC: 4 mo f/u  Ms Brianna Woods is a 78yo with h/o HTN, HLD, fibromyalgia, GERD, subclinical hyperthyroidism, history of TIAs per pt report, asthma and h/o RBBB who presents today for routine follow up.  HTN - stable on current regimen.  Compliant with meds.  No HA, vision changes, CP/tightness, SOB, leg swelling.   subclinical hyperthyroidism - last visit methimazole was increased to 10mg  daily due to persistently low TSH (despite normal T3/T4).  Some constipation, attributes to iron - takes 2 daily.  HLD - off pravastatin 2/2 aches.  Did trial of diet changes for the last 6 months.  IDA - per patient.  Due for recheck.  Denies dizziness.  H/o falls - uses rolling walker with seat regularly.  This helps prevent falls.  Attributes falls to chronic imbalance.  Told may be from FM or from ministrokes.  Current walker worn out - brakes worn out.  Would like new one.  Initial one prescribed in [place]  Pneumovax 2010 zostavax - has never had.  Will check with insurance  Wt Readings from Last 3 Encounters:  04/01/13 198 lb 4 oz (89.926 kg)  07/03/12 196 lb 8 oz (89.132 kg)  05/17/12 199 lb (90.266 kg)    Past Medical History  Diagnosis Date  . HLD (hyperlipidemia)   . HTN (hypertension)   . Fibromyalgia     per pt, no records of this   . History of ulcer disease     per pt, no records of this  . History of nephrolithiasis     s/p surgery R kidney (9mm) 11/2009  . Mixed incontinence     on ditropan  . GERD (gastroesophageal reflux disease)     and esoph stricture s/p dilation 2008  . Barrett's esophagus     on EGD 2008, EGD WNL 2013  . Multinodular goiter     h/o toxic, on methimazole  . Asthma     per pt  . History of CVA (cerebrovascular accident) 2008    "I've had several TIAs"  . Anemia   . History of chicken pox   . History of cardiac murmur   . Vitamin D deficiency   .  Arthritis   . History of right bundle branch block 2011  . Anxiety and depression     was on prozac then effexor  . Diverticulosis 2014    sigmoid on colonoscopy     Review of Systems Per HPI    Objective:   Physical Exam  Nursing note and vitals reviewed. Constitutional: She appears well-developed and well-nourished. No distress.  HENT:  Mouth/Throat: Oropharynx is clear and moist. No oropharyngeal exudate.  Neck: Carotid bruit is not present. Thyromegaly (mild R sided) present.  Cardiovascular: Normal rate, regular rhythm, normal heart sounds and intact distal pulses.   No murmur heard. Pulmonary/Chest: Effort normal and breath sounds normal. No respiratory distress. She has no wheezes. She has no rales.  Musculoskeletal: She exhibits no edema.  Skin: Skin is warm and dry. No rash noted.       Assessment & Plan:

## 2013-04-01 NOTE — Assessment & Plan Note (Signed)
Recheck FLP today. Prior on pravastatin 40, intolerant to lipitor and zocor in past. Goal LDL <100.  Anticipate will need to restart pravastatin, consider RYR if persistent aches.

## 2013-04-01 NOTE — Assessment & Plan Note (Signed)
With imbalance and h/o falls.  New script for rolling walker with seat provided today, advised to take to local DME store.

## 2013-04-01 NOTE — Patient Instructions (Addendum)
Trial off carafate to see how you do. Blood work today - we will call you with thyroid and cholesterol results and plan. If thyroid still off, will refer you to endocrinologist.  Otherwise will just continue to monitor. Call your insurance about the shingles shot to see if it is covered or how much it would cost and where is cheaper (here or pharmacy).  If you want to receive here, call for nurse visit. Good to see you today, call us with questions.

## 2013-04-01 NOTE — Assessment & Plan Note (Addendum)
Subclinical hyperthyroidism. Methimazole dose increased 11/2012.  Recheck levels today.  If persistently low, refer to endo. H/o multinodular goiter, no recent imaging.

## 2013-04-01 NOTE — Assessment & Plan Note (Signed)
Pt denies h/o this - will remove from list.

## 2013-04-01 NOTE — Assessment & Plan Note (Signed)
Chronic, stable. Continue meds. amlodipine and metoprolol regularly, clonidine PRN.

## 2013-04-01 NOTE — Assessment & Plan Note (Signed)
Taking 5000 IU daily. Recheck today. Last check in 10s

## 2013-04-01 NOTE — Assessment & Plan Note (Signed)
Mild anemia in past.  Recheck today along with iron panel. Takes iron bid, but causing constipation which is treated currently with MOM. colonoscopy 09/2012 with diverticulosis.

## 2013-04-01 NOTE — Assessment & Plan Note (Signed)
Chronic.  Stable on nexium 40mg  daily, zantac qhs, and sucralfate daily. I suggested trial off sucralfate to see if sxs control remains.

## 2013-04-02 ENCOUNTER — Other Ambulatory Visit: Payer: Self-pay | Admitting: Family Medicine

## 2013-04-02 LAB — VITAMIN D 25 HYDROXY (VIT D DEFICIENCY, FRACTURES): Vit D, 25-Hydroxy: 18 ng/mL — ABNORMAL LOW (ref 30–89)

## 2013-04-04 ENCOUNTER — Other Ambulatory Visit: Payer: Self-pay | Admitting: Family Medicine

## 2013-04-04 ENCOUNTER — Encounter: Payer: Self-pay | Admitting: *Deleted

## 2013-04-04 MED ORDER — VITAMIN D (ERGOCALCIFEROL) 1.25 MG (50000 UNIT) PO CAPS: 50000.0000 [IU] | ORAL_CAPSULE | ORAL | Status: DC

## 2013-04-11 ENCOUNTER — Other Ambulatory Visit: Payer: Self-pay | Admitting: Family Medicine

## 2013-04-22 ENCOUNTER — Other Ambulatory Visit: Payer: Self-pay | Admitting: Family Medicine

## 2013-05-15 ENCOUNTER — Other Ambulatory Visit: Payer: Self-pay | Admitting: Family Medicine

## 2013-05-15 NOTE — Telephone Encounter (Signed)
Ok to refill? Last office note said you wanted her to see how she did off of carafate.

## 2013-05-16 ENCOUNTER — Other Ambulatory Visit: Payer: Self-pay | Admitting: *Deleted

## 2013-05-16 NOTE — Telephone Encounter (Signed)
Form for refill in your IN box for completion.

## 2013-05-17 MED ORDER — TRAMADOL-ACETAMINOPHEN 37.5-325 MG PO TABS: 1.0000 | ORAL_TABLET | Freq: Three times a day (TID) | ORAL | Status: DC | PRN

## 2013-05-17 NOTE — Telephone Encounter (Signed)
Filled and placed in Kim's box. 

## 2013-05-19 NOTE — Telephone Encounter (Signed)
Faxed to [facility].

## 2013-07-19 ENCOUNTER — Other Ambulatory Visit: Payer: Self-pay | Admitting: Family Medicine

## 2013-08-15 ENCOUNTER — Ambulatory Visit (INDEPENDENT_AMBULATORY_CARE_PROVIDER_SITE_OTHER): Payer: Medicare HMO | Admitting: Family Medicine

## 2013-08-15 ENCOUNTER — Encounter: Payer: Self-pay | Admitting: Family Medicine

## 2013-08-15 VITALS — BP 118/78 | HR 98 | Temp 98.2°F | Wt 187.0 lb

## 2013-08-15 DIAGNOSIS — R0789 Other chest pain: Secondary | ICD-10-CM | POA: Insufficient documentation

## 2013-08-15 DIAGNOSIS — Z23 Encounter for immunization: Secondary | ICD-10-CM

## 2013-08-15 DIAGNOSIS — E042 Nontoxic multinodular goiter: Secondary | ICD-10-CM

## 2013-08-15 DIAGNOSIS — E559 Vitamin D deficiency, unspecified: Secondary | ICD-10-CM

## 2013-08-15 DIAGNOSIS — E785 Hyperlipidemia, unspecified: Secondary | ICD-10-CM

## 2013-08-15 DIAGNOSIS — I10 Essential (primary) hypertension: Secondary | ICD-10-CM

## 2013-08-15 LAB — BASIC METABOLIC PANEL
BUN: 8 mg/dL (ref 6–23)
Creatinine, Ser: 1 mg/dL (ref 0.4–1.2)
GFR: 73.42 mL/min (ref >60.00)
Glucose, Bld: 94 mg/dL (ref 70–99)

## 2013-08-15 LAB — T4, FREE: Free T4: 1.02 ng/dL (ref 0.60–1.60)

## 2013-08-15 LAB — TSH: TSH: 1.71 u[IU]/mL (ref 0.35–5.50)

## 2013-08-15 NOTE — Assessment & Plan Note (Signed)
Suggested trial of RYR

## 2013-08-15 NOTE — Assessment & Plan Note (Signed)
I don't think this is an anginal equivalent - but rather MSK given reproducibility on exam. Will treat with voltaren gel, avoiding NSAIDs given significant GI hx. I asked pt to update me if sxs persist or deteriorate despite topical NSAID. Did improve after GI cocktail some.

## 2013-08-15 NOTE — Addendum Note (Signed)
Addended by: Josph Macho A on: 08/15/2013 11:24 AM   Modules accepted: Orders

## 2013-08-15 NOTE — Patient Instructions (Addendum)
Try red yeast rice 600mg  once to twice daily if tolerated (for cholesterol levels) I think your chest pain is either coming from inflammation of a chest wall joint, or worsening of the heartburn as it did improve with the GI cocktail today. May try voltaren anti inflammatory gel to chest wall over next several days - if this doesn't resolve pain, please let me know. Blood work today. We will call you to schedule ultrasound of thyroid. Return in 4 months for follow up.

## 2013-08-15 NOTE — Assessment & Plan Note (Signed)
Check thyroid ultrasound to eval goiter, no recent baseline here.  Per patient last check was 2011 in [place]. Continue methimazole for h/o subclinical hyperthyroidism. Recheck levels today.

## 2013-08-15 NOTE — Assessment & Plan Note (Signed)
Has been taking vit D 50000 u weekly.  Recheck levels today.

## 2013-08-15 NOTE — Assessment & Plan Note (Signed)
Chronic, stable. Continue meds. 

## 2013-08-15 NOTE — Progress Notes (Signed)
Subjective:    Patient ID: Brianna Woods, female    DOB: 08-Jul-1935, 78 y.o.   MRN: [id number]  HPI CC: 4 mo f/u  Not feeling well today - having some chest discomfort over the last 1.5 weeks.  Describes pressure tightness associated with dyspnea.  Stays nauseated.  Tends to happen in am and at night time, worse with laying flat.  For exercise, walks to mailbox - no chest pain with this.  Denies cough, fever.  No significant dypsnea other than orthopnea.  H/o significant GERD - with persistent sxs despite nexium 40mg  qd, zantac bid, and carafate bid.  HLD - did not tolerate pravastatin 2/2 body aches.  May try RYR. HTN - stable on current regimen.  Did take clonidine this morning. Multinodular goiter, h/o toxic - subclinical hyperthyroidism.  No recent ultrasound. Has not filled new walker prescription  Wt Readings from Last 3 Encounters:  08/15/13 187 lb (84.823 kg)  04/01/13 198 lb 4 oz (89.926 kg)  07/03/12 196 lb 8 oz (89.132 kg)   Past Medical History  Diagnosis Date  . HLD (hyperlipidemia)   . HTN (hypertension)   . Fibromyalgia     per pt, no records of this   . History of ulcer disease     per pt, no records of this  . History of nephrolithiasis     s/p surgery R kidney (9mm) 11/2009  . Mixed incontinence     on ditropan  . GERD (gastroesophageal reflux disease)     and esoph stricture s/p dilation 2008  . Barrett's esophagus     on EGD 2008, EGD WNL 2013  . Multinodular goiter     h/o toxic, on methimazole  . Asthma     per pt  . History of CVA (cerebrovascular accident) 2008    "I've had several TIAs"  . Anemia   . History of chicken pox   . History of cardiac murmur   . Vitamin D deficiency   . Arthritis   . History of right bundle branch block 2011  . History of anxiety     was on prozac then effexor (pt denies h/o anxiety/depression)  . Diverticulosis 2014    sigmoid on colonoscopy   Past Surgical History  Procedure Laterality Date  . Cholecystectomy   2006  . Cataract extraction  1990    w/ implants  . Lithotripsy  2011    R kidney  . Esophagogastroduodenoscopy  11/2011    LA grade A esophagitis lower 1/3, dilated, med HH, o/w WNL - path: + GERD, no barrett's - f/u 11/2016  . Dexa      no records received  . Colonoscopy  10/02/12    diverticulosis, o/w WNL ([place])     Review of Systems Per HPI    Objective:   Physical Exam  Nursing note and vitals reviewed. Constitutional: She appears well-developed and well-nourished. No distress.  HENT:  Mouth/Throat: Oropharynx is clear and moist. No oropharyngeal exudate.  Eyes: Conjunctivae and EOM are normal. Pupils are equal, round, and reactive to light.  Neck: Normal range of motion. Neck supple.  Cardiovascular: Normal rate, regular rhythm, normal heart sounds and intact distal pulses.   No murmur heard. Pulmonary/Chest: Effort normal and breath sounds normal. No respiratory distress. She has no wheezes. She has no rales. She exhibits tenderness.    Left costochondral tenderness to palpation - reproduces chest discomfort she describes  Abdominal: Soft. Normal appearance and bowel sounds are normal. She exhibits  no distension and no mass. There is no hepatosplenomegaly. There is tenderness (mild) in the epigastric area and left upper quadrant. There is no rigidity, no rebound, no guarding and negative Murphy's sign.  Musculoskeletal: She exhibits no edema.  Lymphadenopathy:    She has no cervical adenopathy.       Assessment & Plan:

## 2013-08-15 NOTE — Progress Notes (Signed)
Pre-visit discussion using our clinic review tool. No additional management support is needed unless otherwise documented below in the visit note.  

## 2013-08-18 ENCOUNTER — Encounter: Payer: Self-pay | Admitting: *Deleted

## 2013-08-19 ENCOUNTER — Other Ambulatory Visit: Payer: Self-pay | Admitting: *Deleted

## 2013-08-19 MED ORDER — DICLOFENAC SODIUM 1 % TD GEL: 4.0000 g | Freq: Four times a day (QID) | TRANSDERMAL | Status: DC | PRN

## 2013-08-27 ENCOUNTER — Ambulatory Visit: Payer: Self-pay | Admitting: Family Medicine

## 2013-08-27 ENCOUNTER — Encounter: Payer: Self-pay | Admitting: Family Medicine

## 2013-08-27 IMAGING — US THYROID ULTRASOUND
1 series · 14 of 25 positions shown · non-contrast
Comparison: None.

CLINICAL DATA: Goiter.

EXAM:
THYROID ULTRASOUND
TECHNIQUE: Ultrasound examination of the thyroid gland and adjacent soft
tissues was performed.

[Series 1: thyroid ultrasound · 0.10mm/px · 14 of 74 slices shown]
[im 1/74]
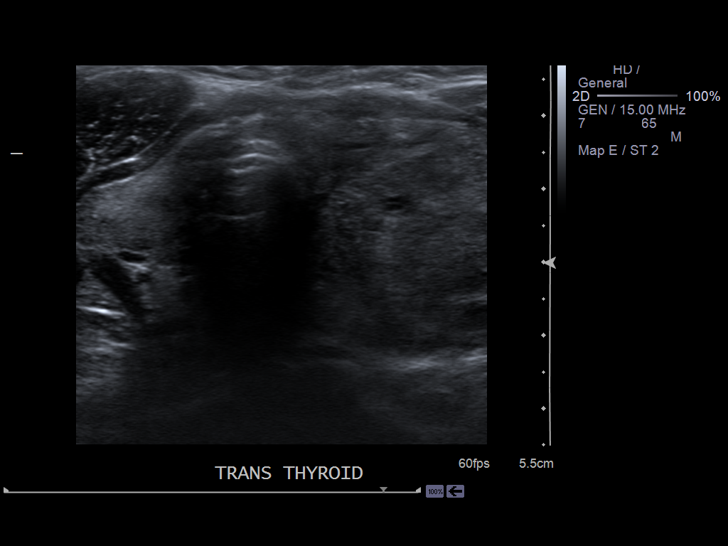
[im 7/74]
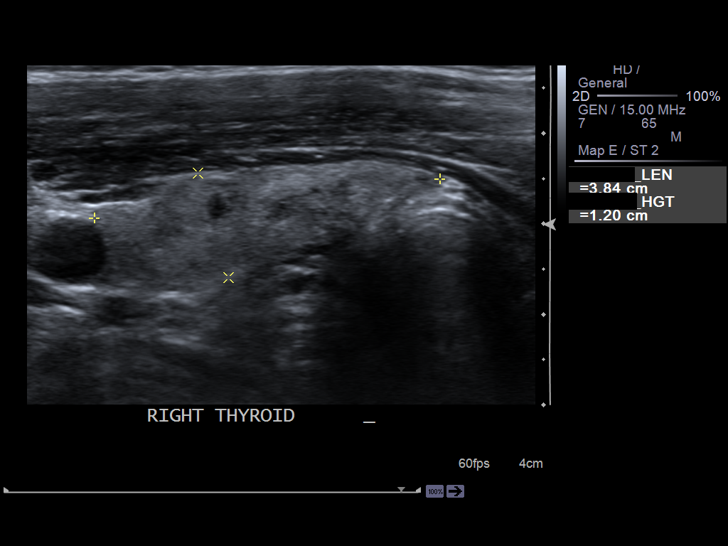
[im 13/74]
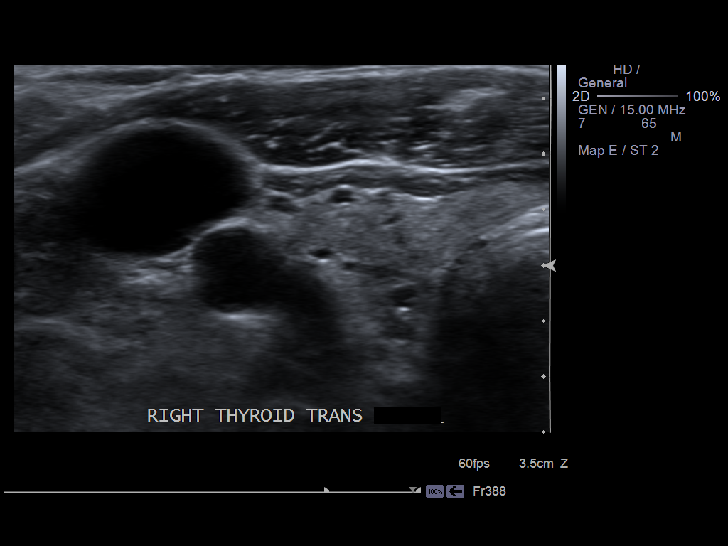
[im 19/74]
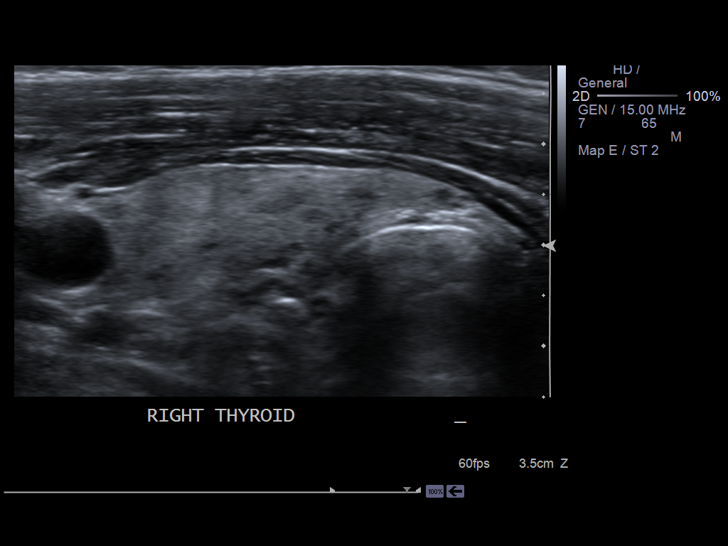
[im 25/74]
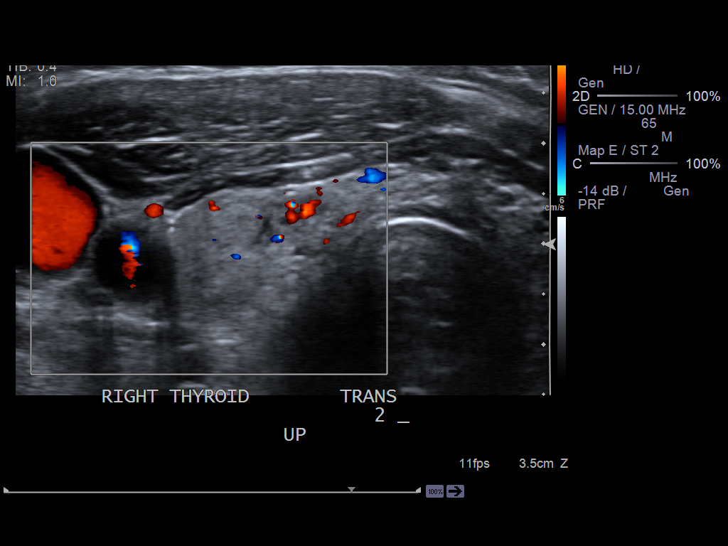
[im 28/74]
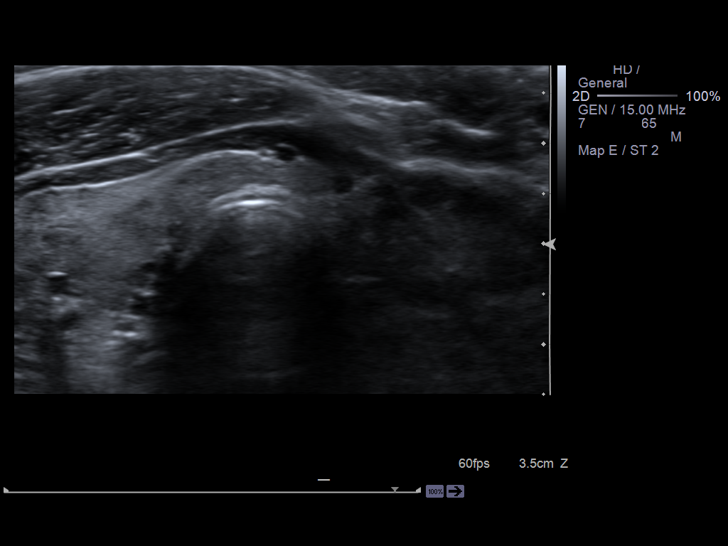
[im 34/74]
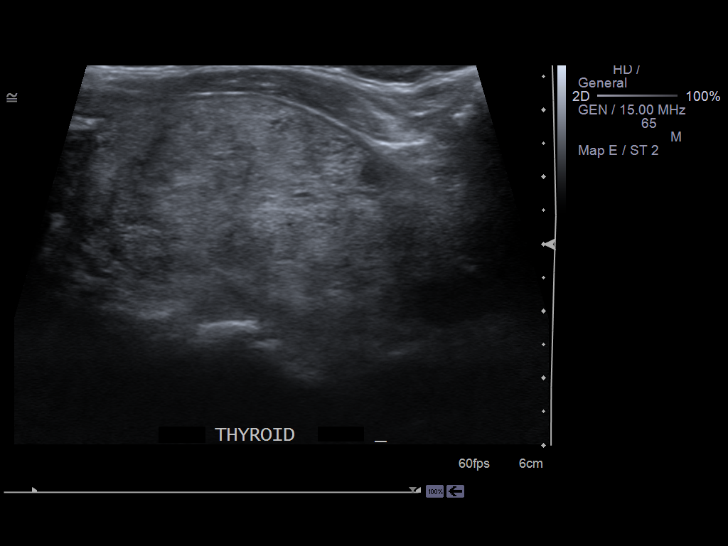
[im 40/74]
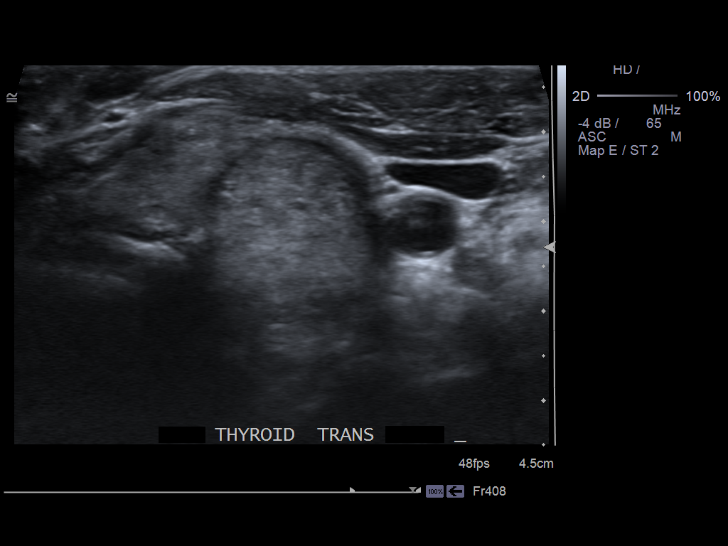
[im 46/74]
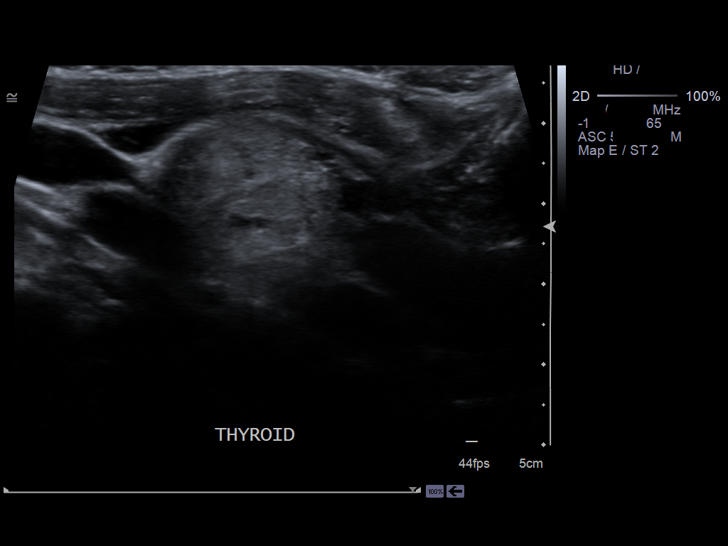
[im 49/74]
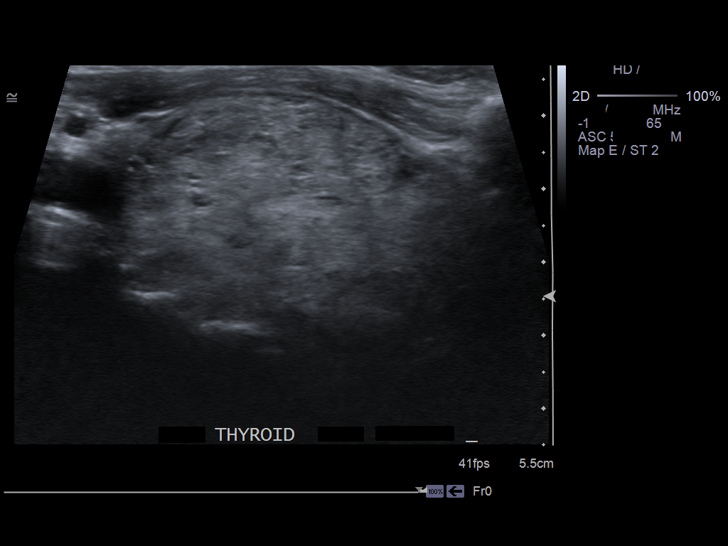
[im 55/74]
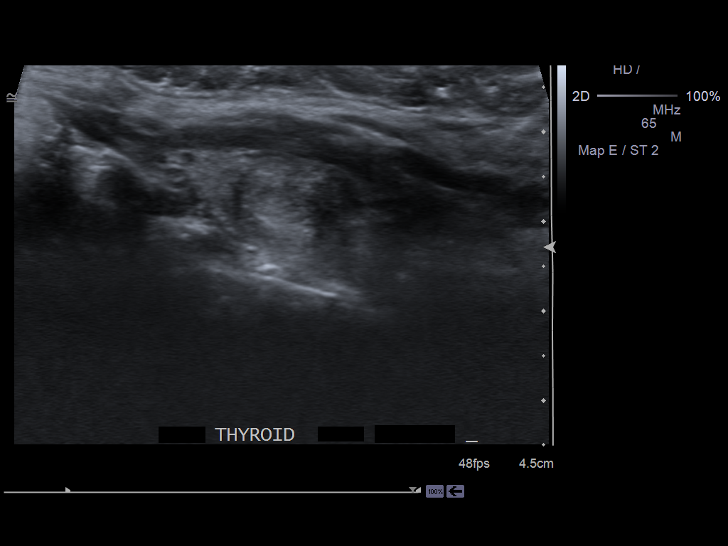
[im 61/74]
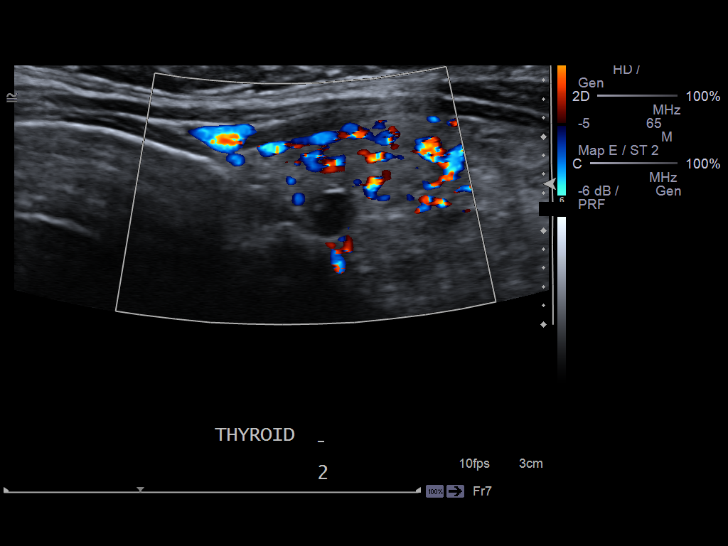
[im 67/74]
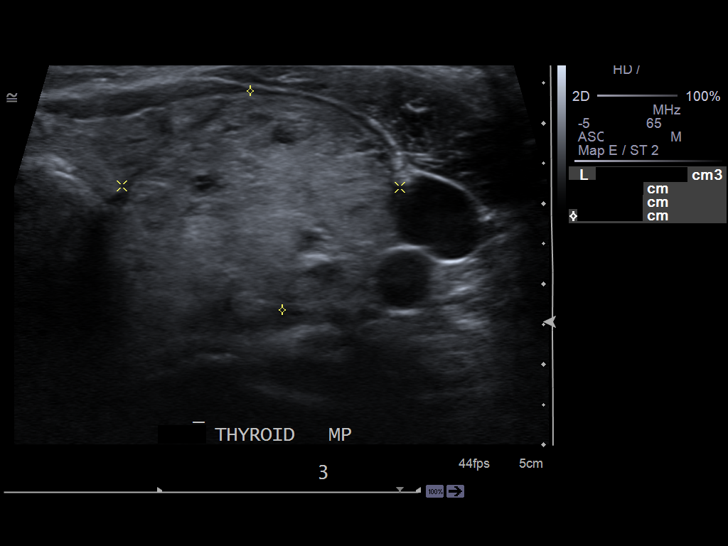
[im 74/74]
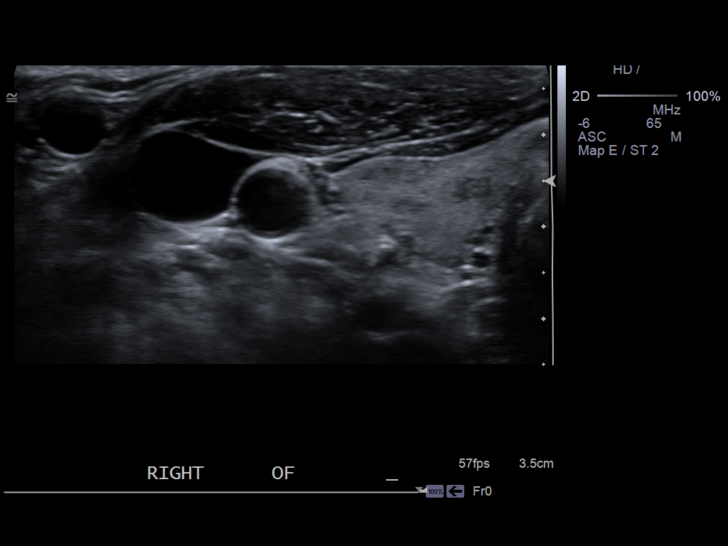

[14 of 25 positions shown; findings below may reference images not displayed]

FINDINGS: Right thyroid lobe

Measurements: 3.8 x 1.2 x 1.7 cm. Innumerable tiny subcentimeter
nodular densities.

Left thyroid lobe

Measurements: 6.4 x 3.3 x 3.9 cmthere is a 3.8 x 3.5 x 2.8 cm
dominant nodule. In the midportion of the left thyroid gland. This
could represent thyroid malignancy and biopsy should be considered.
There are adjacent tiny cm to subcentimeter non dominant nodules.

IsthmusThickness: 4.6 mm.  No nodules visualized.

Lymphadenopathy

None visualized.
IMPRESSION: Dominant 3.8 cm solid nodule left thyroid lobe. This could represent
thyroid malignancy and biopsy suggested.

## 2013-08-28 ENCOUNTER — Encounter: Payer: Self-pay | Admitting: Family Medicine

## 2013-08-28 ENCOUNTER — Other Ambulatory Visit: Payer: Self-pay | Admitting: Family Medicine

## 2013-08-28 DIAGNOSIS — E042 Nontoxic multinodular goiter: Secondary | ICD-10-CM

## 2013-08-28 DIAGNOSIS — E041 Nontoxic single thyroid nodule: Secondary | ICD-10-CM

## 2013-09-05 ENCOUNTER — Other Ambulatory Visit: Payer: Self-pay | Admitting: Family Medicine

## 2013-09-05 ENCOUNTER — Telehealth: Payer: Self-pay | Admitting: Family Medicine

## 2013-09-05 DIAGNOSIS — E041 Nontoxic single thyroid nodule: Secondary | ICD-10-CM

## 2013-09-05 DIAGNOSIS — E042 Nontoxic multinodular goiter: Secondary | ICD-10-CM

## 2013-09-05 NOTE — Telephone Encounter (Signed)
Noted. Will d/c thyroid uptake scan and refer to endocrinology for assistance with management. plz notify patient.

## 2013-09-05 NOTE — Telephone Encounter (Signed)
Called [facility] Nuc Med Dept, and they said that the patient would need to be off her medication Tapazole for two weeks prior to having the uptake scan. Please advise how to handle this and if you want her to see the Endocrinologist first.

## 2013-09-20 ENCOUNTER — Other Ambulatory Visit: Payer: Self-pay | Admitting: Family Medicine

## 2013-10-21 ENCOUNTER — Other Ambulatory Visit: Payer: Self-pay

## 2013-10-21 ENCOUNTER — Other Ambulatory Visit: Payer: Self-pay | Admitting: Family Medicine

## 2013-10-21 NOTE — Telephone Encounter (Signed)
Pt request refill tramadol-apap to [facility].Please advise.

## 2013-10-22 MED ORDER — TRAMADOL-ACETAMINOPHEN 37.5-325 MG PO TABS
1.0000 | ORAL_TABLET | Freq: Three times a day (TID) | ORAL | Status: DC | PRN
Start: ? — End: 2014-04-27

## 2013-10-22 NOTE — Telephone Encounter (Signed)
plz phone in. 

## 2013-10-24 NOTE — Telephone Encounter (Signed)
Rx called in as directed.   

## 2013-10-29 ENCOUNTER — Ambulatory Visit: Payer: Medicare HMO | Admitting: Internal Medicine

## 2013-11-05 ENCOUNTER — Other Ambulatory Visit: Payer: Self-pay | Admitting: Family Medicine

## 2013-11-25 ENCOUNTER — Other Ambulatory Visit: Payer: Self-pay | Admitting: Family Medicine

## 2013-11-25 NOTE — Telephone Encounter (Signed)
Ok to refill 

## 2013-12-10 ENCOUNTER — Other Ambulatory Visit: Payer: Self-pay | Admitting: Family Medicine

## 2013-12-10 NOTE — Telephone Encounter (Signed)
Ok to refill 

## 2013-12-17 ENCOUNTER — Ambulatory Visit: Payer: Medicare HMO | Admitting: Internal Medicine

## 2013-12-17 ENCOUNTER — Ambulatory Visit: Payer: Medicare HMO | Admitting: Family Medicine

## 2013-12-19 ENCOUNTER — Encounter: Payer: Self-pay | Admitting: Family Medicine

## 2013-12-19 ENCOUNTER — Ambulatory Visit (INDEPENDENT_AMBULATORY_CARE_PROVIDER_SITE_OTHER): Payer: Medicare HMO | Admitting: Family Medicine

## 2013-12-19 VITALS — BP 118/78 | HR 92 | Temp 98.8°F | Wt 203.5 lb

## 2013-12-19 DIAGNOSIS — E559 Vitamin D deficiency, unspecified: Secondary | ICD-10-CM

## 2013-12-19 DIAGNOSIS — E042 Nontoxic multinodular goiter: Secondary | ICD-10-CM

## 2013-12-19 DIAGNOSIS — M7989 Other specified soft tissue disorders: Secondary | ICD-10-CM

## 2013-12-19 MED ORDER — METHIMAZOLE 10 MG PO TABS: 10.0000 mg | ORAL_TABLET | Freq: Every day | ORAL | Status: DC

## 2013-12-19 MED ORDER — METOPROLOL TARTRATE 25 MG PO TABS: ORAL_TABLET | ORAL | Status: DC

## 2013-12-19 MED ORDER — AMLODIPINE BESYLATE 10 MG PO TABS: 10.0000 mg | ORAL_TABLET | Freq: Every day | ORAL | Status: DC

## 2013-12-19 NOTE — Progress Notes (Signed)
Pre visit review using our clinic review tool, if applicable. No additional management support is needed unless otherwise documented below in the visit note. 

## 2013-12-19 NOTE — Assessment & Plan Note (Signed)
With R leg swelling noted today posterior lateral leg at popliteal fossa. Will need to r/o DVT today - check R venous duplex today.  If normal ,anticipate baker's cyst.

## 2013-12-19 NOTE — Progress Notes (Signed)
BP 118/78  Pulse 92  Temp(Src) 98.8 F (37.1 C) (Oral)  Wt 203 lb 8 oz (92.307 kg)  SpO2 97%   CC: 4 mo f/u  Subjective:    Patient ID: Brianna Woods, female    DOB: March 25, 1935, 79 y.o.   MRN: [id number]  HPI: Brianna Woods is a 79 y.o. female presenting on 12/19/2013 for Follow-up and Leg Pain   Would like to discuss R leg pain - ongoing since age 14 yo, attributed to L baker's cyst.  Worse pain over last several weeks.  Denies swelling or redness of leg.  On premarin vaginal cream but no oral HRT.  No recent prolonged immobility.  No personal or family history of blood clots.  H/o multinodular goiter with predominant L sided 3cm thyroid nodule.  On methimazole.  Pt cancelled endo appt because she was confused about this being an appt for EGD.  Discussed, pt agrees to return to see endo.  Relevant past medical, surgical, family and social history reviewed and updated as indicated.  Allergies and medications reviewed and updated. Current Outpatient Prescriptions on File Prior to Visit  Medication Sig  . cholecalciferol (VITAMIN D) 1000 UNITS tablet Take 5,000 Units by mouth daily.  . cloNIDine (CATAPRES) 0.1 MG tablet TAKE 1 TABLET TWICE DAILY AS NEEDED  . conjugated estrogens (PREMARIN) vaginal cream Place 3.01 Applicatorfuls vaginally 3 (three) times a week.  . diclofenac sodium (VOLTAREN) 1 % GEL Apply 4 g topically 4 (four) times daily as needed.  . ferrous sulfate 325 (65 FE) MG tablet Take 1 tablet (325 mg total) by mouth daily with breakfast.  . gabapentin (NEURONTIN) 300 MG capsule TAKE 1 CAPSULE THREE TIMES DAILY  . ondansetron (ZOFRAN) 4 MG tablet TAKE 1 TABLET EVERY 12 HOURS AS NEEDED  FOR  NAUSEA  . oxybutynin (DITROPAN-XL) 10 MG 24 hr tablet TAKE 1 TABLET (10 MG TOTAL) BY MOUTH DAILY.  . ranitidine (ZANTAC) 300 MG tablet TAKE 1 TABLET AT BEDTIME.  . Red Yeast Rice 600 MG CAPS Take 1 capsule by mouth 2 (two) times daily.  . sucralfate (CARAFATE) 1 G tablet TAKE 1 TABLET  FOUR TIMES DAILY  . traMADol-acetaminophen (ULTRACET) 37.5-325 MG per tablet Take 1 tablet by mouth every 8 (eight) hours as needed.  . traZODone (DESYREL) 100 MG tablet TAKE 1/2 TO 1 TABLET AT BEDTIME  . VENTOLIN HFA 108 (90 BASE) MCG/ACT inhaler INHALE 2 PUFFS EVERY 6 HOURS AS NEEDED FOR WHEEZING  . Vitamin D, Ergocalciferol, (DRISDOL) 50000 UNITS CAPS Take 1 capsule (50,000 Units total) by mouth every 7 (seven) days.  Marland Kitchen esomeprazole (NEXIUM) 40 MG capsule Take 1 capsule (40 mg total) by mouth daily.  . [DISCONTINUED] oxybutynin (DITROPAN-XL) 10 MG 24 hr tablet TAKE 1 TABLET (10 MG TOTAL) BY MOUTH DAILY.  . [DISCONTINUED] pirbuterol (MAXAIR) 200 MCG/INH inhaler Inhale 2 puffs into the lungs 4 (four) times daily.   No current facility-administered medications on file prior to visit.    Review of Systems Per HPI unless specifically indicated above    Objective:    BP 118/78  Pulse 92  Temp(Src) 98.8 F (37.1 C) (Oral)  Wt 203 lb 8 oz (92.307 kg)  SpO2 97%  Physical Exam  Nursing note and vitals reviewed. Constitutional: She appears well-developed and well-nourished. No distress.  HENT:  Mouth/Throat: Oropharynx is clear and moist. No oropharyngeal exudate.  Neck: Normal range of motion. Neck supple. Thyromegaly (L>R) present.  Cardiovascular: Normal rate, regular rhythm, normal heart sounds and  intact distal pulses.   No murmur heard. Pulmonary/Chest: Effort normal and breath sounds normal. No respiratory distress. She has no wheezes. She has no rales.  Musculoskeletal: She exhibits edema (mild).  Tender to palpation R popliteal area as well as tender swelling noted lateral posterior knee - possible palpable cord Mild nonpitting edema bilaterally. 2+ rad pulses bilaterally Bilateral calf circ 38cm  Lymphadenopathy:    She has no cervical adenopathy.   Results for orders placed in visit on 08/15/13  TSH      Result Value Ref Range   TSH 1.71  0.35 - 5.50 uIU/mL  T4, FREE       Result Value Ref Range   Free T4 1.02  0.60 - 1.60 ng/dL  VITAMIN D 25 HYDROXY      Result Value Ref Range   Vit D, 25-Hydroxy 14 (*) 30 - 89 ng/mL  BASIC METABOLIC PANEL      Result Value Ref Range   Sodium 136  135 - 145 mEq/L   Potassium 3.7  3.5 - 5.1 mEq/L   Chloride 102  96 - 112 mEq/L   CO2 25  19 - 32 mEq/L   Glucose, Bld 94  70 - 99 mg/dL   BUN 8  6 - 23 mg/dL   Creatinine, Ser 1.0  0.4 - 1.2 mg/dL   Calcium 9.6  8.4 - 10.5 mg/dL   GFR 73.42  >60.00 mL/min      Assessment & Plan:   Problem List Items Addressed This Visit   Multinodular goiter     H/o hyperthyroidism - now on methimazole.  Needs f/u radioactive thyroid uptake scan, and again discussed endo referral. Pt agrees today so will refer. Large L sided dominant nodule s/p benign biopsy per patient 2010.    Relevant Medications      metoprolol tartrate (LOPRESSOR) tablet      methimazole (TAPAZOLE) tablet   Other Relevant Orders      Ambulatory referral to Endocrinology   Vitamin D deficiency     Endorses compliance with vit D daily - I asked her to bring supplement she takes at home to next office visit to review.    Right leg swelling - Primary     With R leg swelling noted today posterior lateral leg at popliteal fossa. Will need to r/o DVT today - check R venous duplex today.  If normal ,anticipate baker's cyst.    Relevant Orders      Lower Extremity Venous Reflux Right       Follow up plan: Return in about 3 months (around 03/20/2014), or as needed, for annual exam, prior fasting for blood work.

## 2013-12-19 NOTE — Assessment & Plan Note (Signed)
H/o hyperthyroidism - now on methimazole.  Needs f/u radioactive thyroid uptake scan, and again discussed endo referral. Pt agrees today so will refer. Large L sided dominant nodule s/p benign biopsy per patient 2010.

## 2013-12-19 NOTE — Assessment & Plan Note (Signed)
Endorses compliance with vit D daily - I asked her to bring supplement she takes at home to next office visit to review.

## 2013-12-19 NOTE — Patient Instructions (Addendum)
Pass by Brianna Woods's office to schedule endocrinologist appointment to discuss thyroid uptake scan as well as schedule today if possible R leg ultrasound to help rule out blood clot. Return for medicare wellness visit in 3-4 months, prior fasting for blood work. Amlodipine and lopressor refilled.

## 2013-12-23 ENCOUNTER — Ambulatory Visit: Payer: Self-pay | Admitting: Family Medicine

## 2013-12-23 ENCOUNTER — Telehealth: Payer: Self-pay | Admitting: Family Medicine

## 2013-12-23 IMAGING — US US EXTREM LOW VENOUS*R*
1 series · 13 of 24 positions shown · non-contrast
Comparison: None.

CLINICAL DATA: Right leg swelling.



[Series 1: us extrem low venous*right* · 0.10mm/px · 13 of 44 slices shown]
[im 1/44]
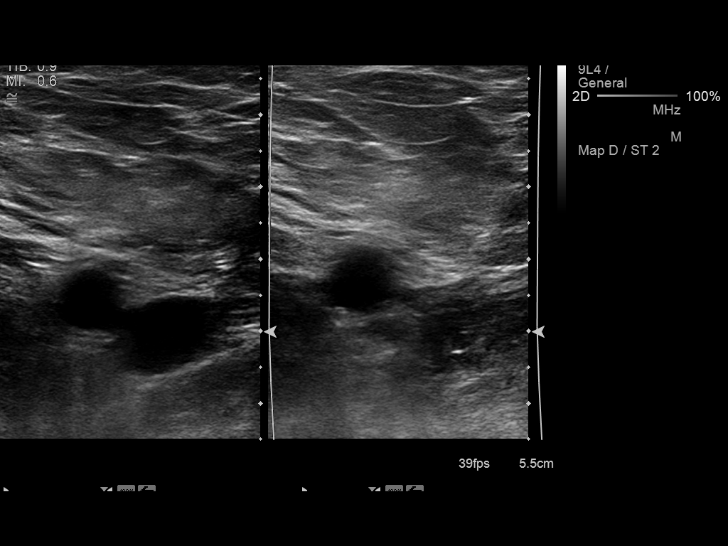
[im 4/44]
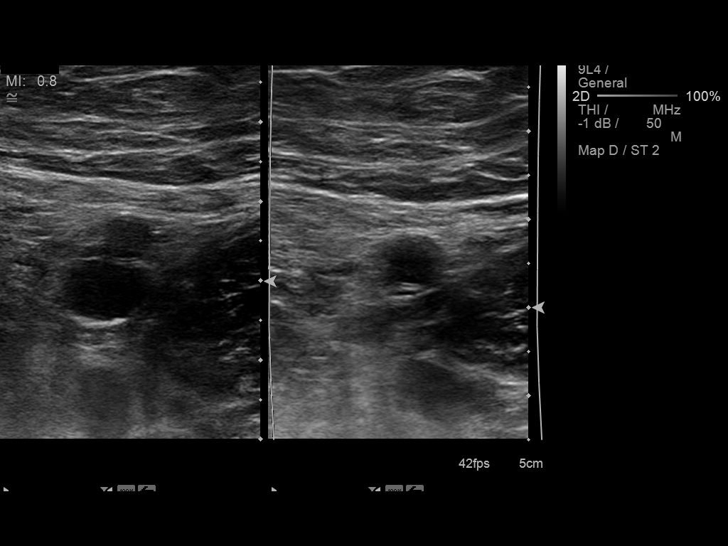
[im 8/44]
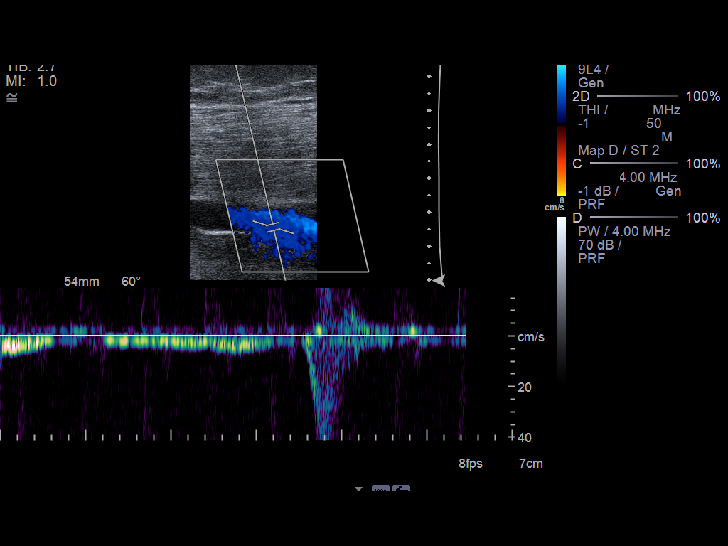
[im 12/44]
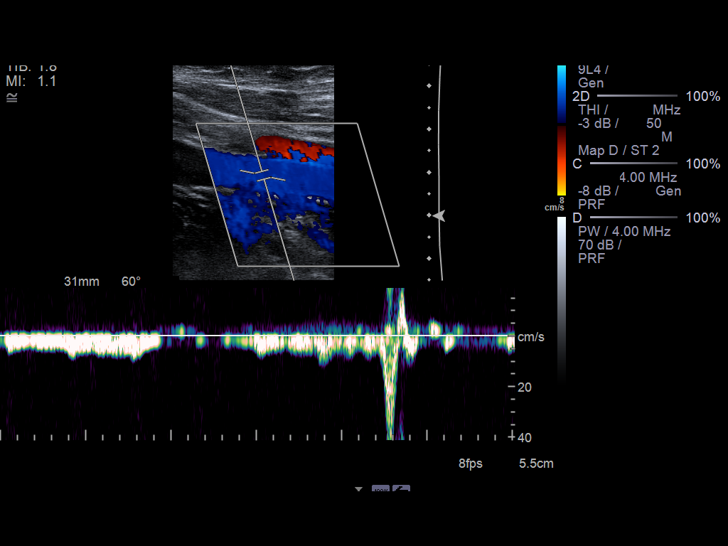
[im 15/44]
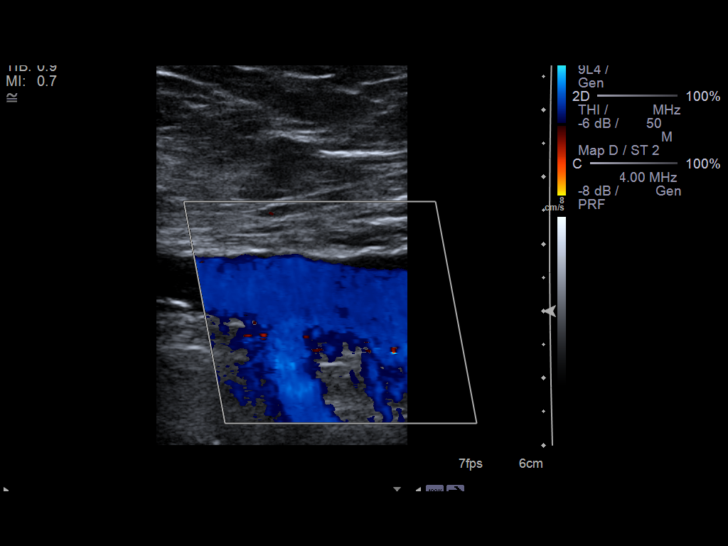
[im 19/44]
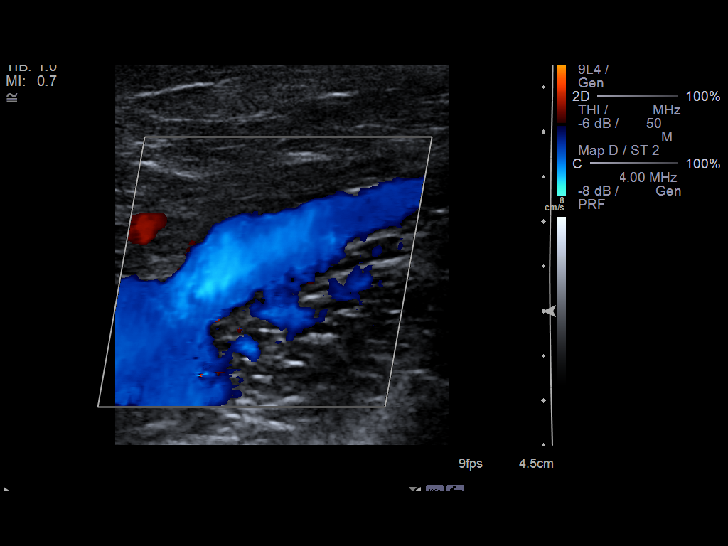
[im 23/44]
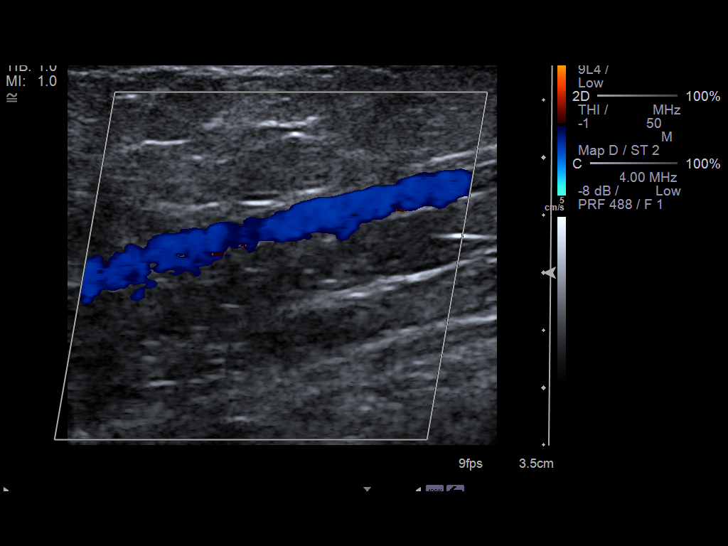
[im 25/44]
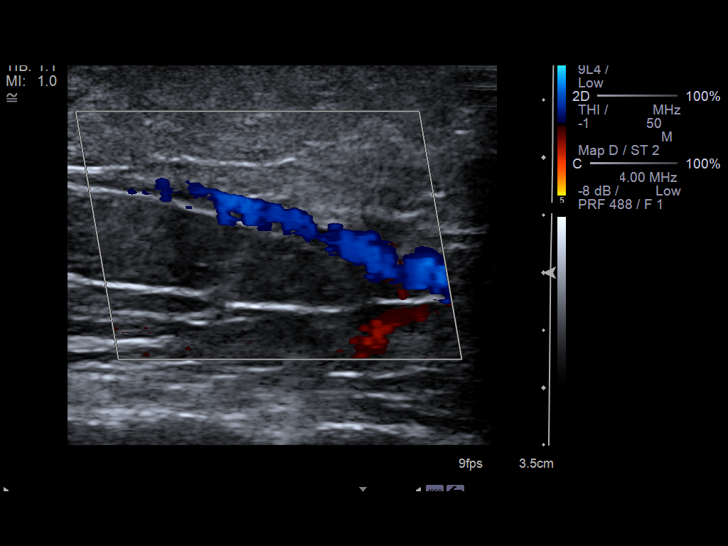
[im 29/44]
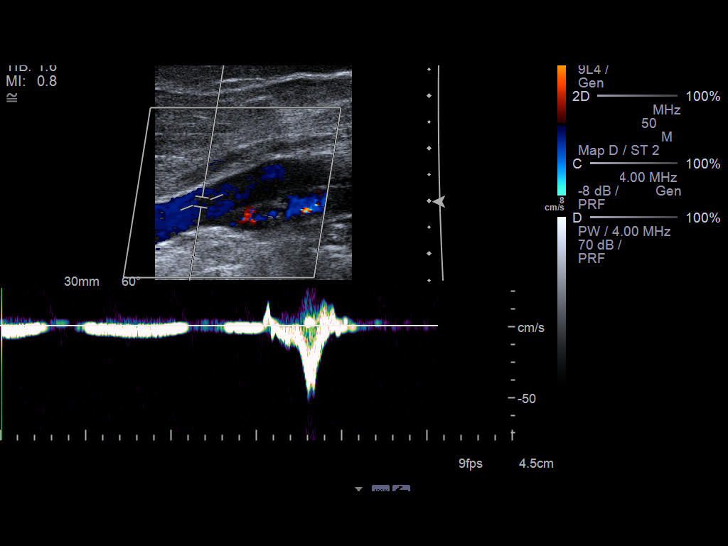
[im 32/44]
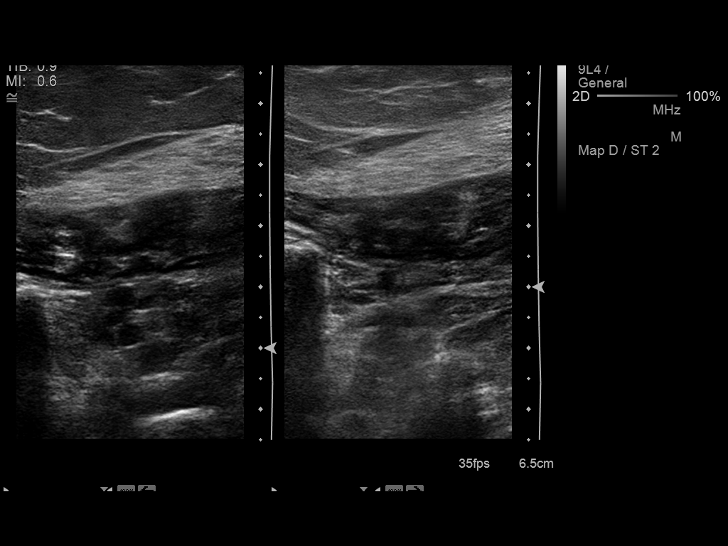
[im 36/44]
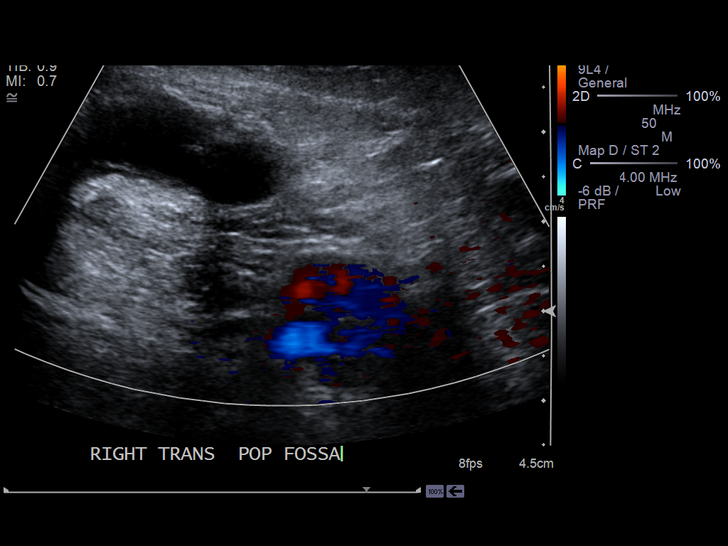
[im 40/44]
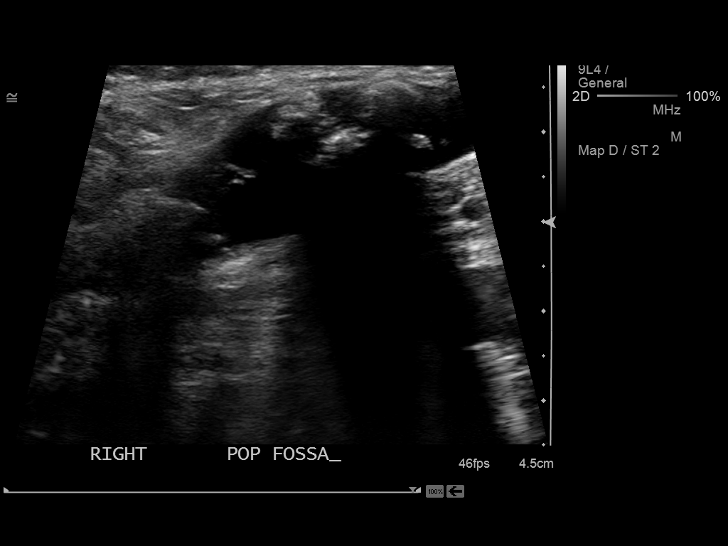
[im 44/44]
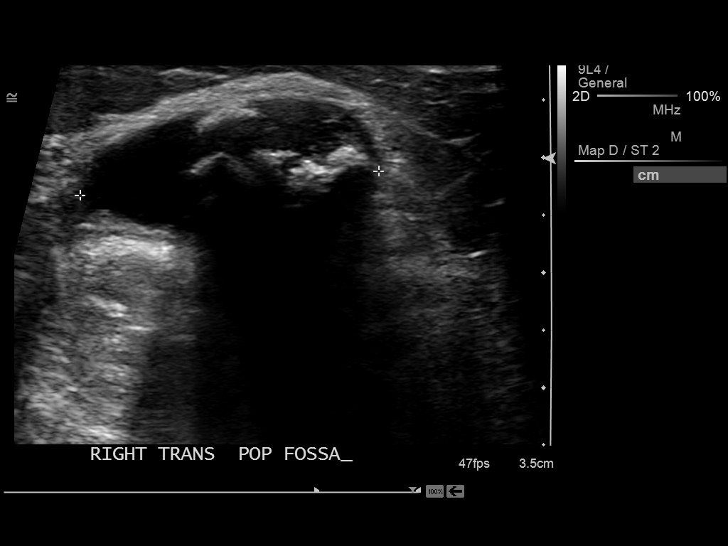

[13 of 24 positions shown; findings below may reference images not displayed]

FINDINGS: Common Femoral Vein: No evidence of thrombus. Normal
compressibility, respiratory phasicity and response to augmentation.

Saphenofemoral Junction: No evidence of thrombus. Normal
compressibility and flow on color Doppler imaging.

Profunda Femoral Vein: No evidence of thrombus. Normal
compressibility and flow on color Doppler imaging.

Femoral Vein: No evidence of thrombus. Normal compressibility,
respiratory phasicity and response to augmentation.

Popliteal Vein: No evidence of thrombus. Normal compressibility,
respiratory phasicity and response to augmentation.

Calf Veins: No evidence of thrombus. Normal compressibility and flow
on color Doppler imaging.

Superficial Great Saphenous Vein: No evidence of thrombus. Normal
compressibility and flow on color Doppler imaging.

Venous Reflux:  None.

Other Findings: There is a popliteal cyst containing shadowing
calcifications. It measures 5.8 cm x 1.4 cm x 2.6 cm.
IMPRESSION: 1. No evidence of deep venous thrombosis.
2. Baker cyst containing calcifications, consistent with calcified
or ossified intra-articular bodies which have migrated into the
Baker's cyst.

## 2013-12-23 NOTE — Telephone Encounter (Signed)
plz notify ultrasound returned negative for DVT - showing known baker's cyst - this is likely where pain is coming from Recommend voltaren gel she has at home to use at area of pain/swelling, elevate legs, and let us know if not better with this.Marland Kitchen

## 2013-12-23 NOTE — Addendum Note (Signed)
Addended by: Ria Bush on: 12/23/2013 02:35 PM   Modules accepted: Orders

## 2013-12-23 NOTE — Telephone Encounter (Signed)
Call report from Jani Files with [facility].  Pt had R leg doppler US.  Negative for DVT. She does have fluid in her popliteal fossa with calcifications and a known Baker's cyst for 25+ years (5.8cm x 1.4cmx 2.6cm).  Direct line to [place] room [phone] or main # [phone].

## 2013-12-24 ENCOUNTER — Encounter: Payer: Self-pay | Admitting: Family Medicine

## 2013-12-25 NOTE — Telephone Encounter (Signed)
Patient notified

## 2014-01-02 ENCOUNTER — Telehealth: Payer: Self-pay

## 2014-01-02 NOTE — Telephone Encounter (Signed)
Mrs Brianna Woods pts daughter wants to know what pt needs to do to get medical records from doctor in [place]. Advised if need recent medical records Mrs Brianna Woods is to ck with pt to see if she has signed record release requesting records. Mrs Brianna Woods will speak with pt and if needed will have pt cb.

## 2014-01-16 ENCOUNTER — Other Ambulatory Visit: Payer: Self-pay | Admitting: *Deleted

## 2014-01-16 MED ORDER — ONDANSETRON HCL 4 MG PO TABS: ORAL_TABLET | ORAL | Status: DC

## 2014-01-16 NOTE — Telephone Encounter (Signed)
Last ov 12/19/13, and has cpx scheduled for 03/20/14.

## 2014-03-07 ENCOUNTER — Other Ambulatory Visit: Payer: Self-pay | Admitting: Family Medicine

## 2014-03-09 ENCOUNTER — Encounter: Payer: Self-pay | Admitting: Family Medicine

## 2014-03-09 DIAGNOSIS — R32 Unspecified urinary incontinence: Secondary | ICD-10-CM | POA: Insufficient documentation

## 2014-03-09 NOTE — Telephone Encounter (Signed)
Ok to refill 

## 2014-03-10 NOTE — Telephone Encounter (Signed)
Pt request status of multiple refills to [facility]; advised pt done. Pt will ck with pharmacy.

## 2014-03-20 ENCOUNTER — Encounter: Payer: Self-pay | Admitting: Family Medicine

## 2014-03-20 ENCOUNTER — Encounter (INDEPENDENT_AMBULATORY_CARE_PROVIDER_SITE_OTHER): Payer: Self-pay

## 2014-03-20 ENCOUNTER — Ambulatory Visit (INDEPENDENT_AMBULATORY_CARE_PROVIDER_SITE_OTHER): Payer: Medicare HMO | Admitting: Family Medicine

## 2014-03-20 VITALS — BP 136/84 | HR 100 | Temp 98.2°F | Ht 60.0 in | Wt 199.2 lb

## 2014-03-20 DIAGNOSIS — Z0000 Encounter for general adult medical examination without abnormal findings: Secondary | ICD-10-CM | POA: Insufficient documentation

## 2014-03-20 DIAGNOSIS — Z23 Encounter for immunization: Secondary | ICD-10-CM

## 2014-03-20 DIAGNOSIS — Z1231 Encounter for screening mammogram for malignant neoplasm of breast: Secondary | ICD-10-CM

## 2014-03-20 DIAGNOSIS — E0590 Thyrotoxicosis, unspecified without thyrotoxic crisis or storm: Secondary | ICD-10-CM

## 2014-03-20 DIAGNOSIS — E042 Nontoxic multinodular goiter: Secondary | ICD-10-CM

## 2014-03-20 DIAGNOSIS — E785 Hyperlipidemia, unspecified: Secondary | ICD-10-CM

## 2014-03-20 DIAGNOSIS — E559 Vitamin D deficiency, unspecified: Secondary | ICD-10-CM

## 2014-03-20 DIAGNOSIS — I10 Essential (primary) hypertension: Secondary | ICD-10-CM

## 2014-03-20 DIAGNOSIS — D509 Iron deficiency anemia, unspecified: Secondary | ICD-10-CM

## 2014-03-20 LAB — CBC WITH DIFFERENTIAL/PLATELET
Basophils Absolute: 0 10*3/uL (ref 0.0–0.1)
Basophils Relative: 0 % (ref 0.0–3.0)
Eosinophils Absolute: 0 10*3/uL (ref 0.0–0.7)
Eosinophils Relative: 0 % (ref 0.0–5.0)
HEMATOCRIT: 40.5 % (ref 36.0–46.0)
Hemoglobin: 13.2 g/dL (ref 12.0–15.0)
LYMPHS ABS: 1.5 10*3/uL (ref 0.7–4.0)
LYMPHS PCT: 11.3 % — AB (ref 12.0–46.0)
MCHC: 32.5 g/dL (ref 30.0–36.0)
MCV: 90.5 fl (ref 78.0–100.0)
MONOS PCT: 3.6 % (ref 3.0–12.0)
Monocytes Absolute: 0.5 10*3/uL (ref 0.1–1.0)
Neutro Abs: 11.2 10*3/uL — ABNORMAL HIGH (ref 1.4–7.7)
Neutrophils Relative %: 85.1 % — ABNORMAL HIGH (ref 43.0–77.0)
PLATELETS: 318 10*3/uL (ref 150.0–400.0)
RBC: 4.48 Mil/uL (ref 3.87–5.11)
RDW: 14 % (ref 11.5–15.5)
WBC: 13.1 10*3/uL — AB (ref 4.0–10.5)

## 2014-03-20 LAB — COMPREHENSIVE METABOLIC PANEL
ALT: 6 U/L (ref 0–35)
AST: 15 U/L (ref 0–37)
Albumin: 4.1 g/dL (ref 3.5–5.2)
Alkaline Phosphatase: 99 U/L (ref 39–117)
BUN: 11 mg/dL (ref 6–23)
CO2: 25 mEq/L (ref 19–32)
CREATININE: 0.9 mg/dL (ref 0.4–1.2)
Calcium: 10.3 mg/dL (ref 8.4–10.5)
Chloride: 99 mEq/L (ref 96–112)
GFR: 81.14 mL/min (ref >60.00)
Glucose, Bld: 109 mg/dL — ABNORMAL HIGH (ref 70–99)
Potassium: 4 mEq/L (ref 3.5–5.1)
SODIUM: 135 meq/L (ref 135–145)
Total Bilirubin: 0.7 mg/dL (ref 0.2–1.2)
Total Protein: 8 g/dL (ref 6.0–8.3)

## 2014-03-20 LAB — LIPID PANEL
Cholesterol: 268 mg/dL — ABNORMAL HIGH (ref 0–200)
HDL: 55.6 mg/dL (ref >39.00)
LDL Cholesterol: 196 mg/dL — ABNORMAL HIGH (ref 0–99)
NonHDL: 212.4
Total CHOL/HDL Ratio: 5
Triglycerides: 81 mg/dL (ref 0.0–149.0)
VLDL: 16.2 mg/dL (ref 0.0–40.0)

## 2014-03-20 LAB — FERRITIN: FERRITIN: 32.5 ng/mL (ref 10.0–291.0)

## 2014-03-20 LAB — T4, FREE: FREE T4: 0.9 ng/dL (ref 0.60–1.60)

## 2014-03-20 LAB — TSH: TSH: 0.47 u[IU]/mL (ref 0.35–4.50)

## 2014-03-20 LAB — VITAMIN D 25 HYDROXY (VIT D DEFICIENCY, FRACTURES): VITD: 29.51 ng/mL

## 2014-03-20 MED ORDER — OXYBUTYNIN CHLORIDE ER 10 MG PO TB24: ORAL_TABLET | ORAL | Status: DC

## 2014-03-20 NOTE — Assessment & Plan Note (Signed)
I have personally reviewed the Medicare Annual Wellness questionnaire and have noted 1. The patient's medical and social history 2. Their use of alcohol, tobacco or illicit drugs 3. Their current medications and supplements 4. The patient's functional ability including ADL's, fall risks, home safety risks and hearing or visual impairment. 5. Diet and physical activity 6. Evidence for depression or mood disorders The patients weight, height, BMI have been recorded in the chart.  Hearing and vision has been addressed. I have made referrals, counseling and provided education to the patient based review of the above and I have provided the pt with a written personalized care plan for preventive services. Provider list updated - see scanned questionairre. Advanced directives discussed: handout provided. Full code but no prolonged life support. Would want daughter in [place] to be HCPOA.  Reviewed preventative protocols and updated unless pt declined.

## 2014-03-20 NOTE — Progress Notes (Signed)
BP 136/84  Pulse 100  Temp(Src) 98.2 F (36.8 C) (Oral)  Ht 5' (1.524 m)  Wt 199 lb 4 oz (90.379 kg)  BMI 38.91 kg/m2   CC: medicare wellness visit  Subjective:    Patient ID: Brianna Woods, female    DOB: 1935-03-23, 79 y.o.   MRN: [id number]  HPI: Brianna Woods is a 79 y.o. female presenting on 03/20/2014 for Annual Exam   Has appt with endo Dr. Gabriel Carina next week for h/o multinodular goiter and sublcinical hyperthyroidism on methimazole long term.  Lab Results  Component Value Date   TSH 1.71 08/15/2013    Passes hearing and vision screens today Denies depression, anhedonia, sadness Increased fall risk - 2 in last year - states she feels lightheaded then legs get weak and she falls. Uses walker regularly. Walker broke - lost previous prescription. Requests new script.  No vertigo, no presyncope.  Preventative: Last CPE 2012 with Dr. Phineas Real in [place]  ESOPHAGOGASTRODUODENOSCOPY Date: 11/2011 LA grade A esophagitis lower 1/3, dilated, med HH, o/w WNL - path: + GERD, no barrett's - f/u 11/2016  COLONOSCOPY Date: 10/02/12 diverticulosis, o/w WNL ([place]) Last mammogram 10 yrs ago - will order today. Well woman - last done 2012. H/o calcified benign ovarian cyst.  Dexa no records received  Tetanus - 2008  Pneumonia shot - done 2010. prevnar today. Shingles shot - doesn't think would want.  Advanced directive: has not set up. Packet provided today. Would want daughter in [place] to be HCPOA. 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  Allergies and medications reviewed and updated. Current Outpatient Prescriptions on File  Prior to Visit  Medication Sig  . amLODipine (NORVASC) 10 MG tablet Take 1 tablet (10 mg total) by mouth daily.  . cholecalciferol (VITAMIN D) 1000 UNITS tablet Take 5,000 Units by mouth daily.  . cloNIDine (CATAPRES) 0.1 MG tablet TAKE 1 TABLET TWICE DAILY AS NEEDED  . diclofenac sodium (VOLTAREN) 1 % GEL Apply 4 g topically 4 (four) times daily as needed.  . ferrous sulfate 325 (65 FE) MG tablet Take 1 tablet (325 mg total) by mouth daily with breakfast.  . gabapentin (NEURONTIN) 300 MG capsule TAKE 1 CAPSULE THREE TIMES DAILY  . methimazole (TAPAZOLE) 10 MG tablet Take 1 tablet (10 mg total) by mouth daily.  . metoprolol tartrate (LOPRESSOR) 25 MG tablet TAKE 1 TABLET TWICE DAILY  . NEXIUM 40 MG capsule TAKE 1 CAPSULE EVERY DAY  . ondansetron (ZOFRAN) 4 MG tablet TAKE 1 TABLET EVERY 12 HOURS AS NEEDED  FOR  NAUSEA.  Marland Kitchen PREMARIN vaginal cream APPLY 1/4 APPLICATORFUL VAGINALLY THREE TIMES A WEEK  . ranitidine (ZANTAC) 300 MG tablet TAKE 1 TABLET AT BEDTIME.  . Red Yeast Rice 600 MG CAPS Take 1 capsule by mouth 2 (two) times daily.  . sucralfate (CARAFATE) 1 G tablet TAKE 1 TABLET FOUR TIMES DAILY  . traMADol-acetaminophen (ULTRACET) 37.5-325 MG per tablet Take 1 tablet by mouth every 8 (eight) hours as needed.  . traZODone (DESYREL) 100 MG tablet TAKE 1/2 TO 1 TABLET AT BEDTIME  . VENTOLIN HFA 108 (90 BASE) MCG/ACT inhaler INHALE 2 PUFFS  EVERY 6 HOURS AS NEEDED FOR WHEEZING  . Vitamin D, Ergocalciferol, (DRISDOL) 50000 UNITS CAPS Take 1 capsule (50,000 Units total) by mouth every 7 (seven) days.  . [DISCONTINUED] pirbuterol (MAXAIR) 200 MCG/INH inhaler Inhale 2 puffs into the lungs 4 (four) times daily.   No current facility-administered medications on file prior to visit.    Review of Systems Per HPI unless specifically indicated above    Objective:    BP 136/84  Pulse 100  Temp(Src) 98.2 F (36.8 C) (Oral)  Ht 5' (1.524 m)  Wt 199 lb 4 oz (90.379 kg)  BMI 38.91 kg/m2  Physical  Exam  Nursing note and vitals reviewed. Constitutional: She is oriented to person, place, and time. She appears well-developed and well-nourished. No distress.  HENT:  Head: Normocephalic and atraumatic.  Right Ear: Hearing, tympanic membrane, external ear and ear canal normal.  Left Ear: Hearing, tympanic membrane, external ear and ear canal normal.  Nose: Nose normal.  Mouth/Throat: Uvula is midline, oropharynx is clear and moist and mucous membranes are normal. No oropharyngeal exudate, posterior oropharyngeal edema or posterior oropharyngeal erythema.  Eyes: Conjunctivae and EOM are normal. Pupils are equal, round, and reactive to light. No scleral icterus.  Neck: Normal range of motion. Neck supple. Thyromegaly (L>R) present.  Cardiovascular: Normal rate, regular rhythm, normal heart sounds and intact distal pulses.   No murmur heard. Pulses:      Radial pulses are 2+ on the right side, and 2+ on the left side.  Pulmonary/Chest: Effort normal and breath sounds normal. No respiratory distress. She has no wheezes. She has no rales. Right breast exhibits no inverted nipple, no mass, no nipple discharge, no skin change and no tenderness. Left breast exhibits no inverted nipple, no mass, no nipple discharge, no skin change and no tenderness.  Abdominal: Soft. Bowel sounds are normal. She exhibits no distension and no mass. There is no tenderness. There is no rebound and no guarding.  Musculoskeletal: Normal range of motion. She exhibits no edema.  Lymphadenopathy:    She has no cervical adenopathy.    She has no axillary adenopathy.       Right axillary: No lateral adenopathy present.       Left axillary: No lateral adenopathy present.      Right: No supraclavicular adenopathy present.       Left: No supraclavicular adenopathy present.  Neurological: She is alert and oriented to person, place, and time.  CN grossly intact, station and gait intact Recall 3/3 Calculation 5/5 serial 3s    Skin: Skin is warm and dry. No rash noted.  Psychiatric: She has a normal mood and affect. Her behavior is normal. Judgment and thought content normal.   Results for orders placed in visit on 08/15/13  TSH      Result Value Ref Range   TSH 1.71  0.35 - 5.50 uIU/mL  T4, FREE      Result Value Ref Range   Free T4 1.02  0.60 - 1.60 ng/dL  VITAMIN D 25 HYDROXY      Result Value Ref Range   Vit D, 25-Hydroxy 14 (*) 30 - 89 ng/mL  BASIC METABOLIC PANEL      Result Value Ref Range   Sodium 136  135 - 145 mEq/L   Potassium 3.7  3.5 - 5.1 mEq/L   Chloride 102  96 - 112 mEq/L   CO2 25  19 - 32 mEq/L   Glucose, Bld 94  70 - 99  mg/dL   BUN 8  6 - 23 mg/dL   Creatinine, Ser 1.0  0.4 - 1.2 mg/dL   Calcium 9.6  8.4 - 10.5 mg/dL   GFR 73.42  >60.00 mL/min      Assessment & Plan:   Problem List Items Addressed This Visit   Vitamin D deficiency     Recheck today. Pt reports taking 50,000 IU weekly.    Relevant Orders      Vit D  25 hydroxy (rtn osteoporosis monitoring)   Subclinical hyperthyroidism     On methimazole. Pending appt with endo next week. Check TSH, free T4 today.    Multinodular goiter   Relevant Orders      TSH      T4, Free   Medicare annual wellness visit, subsequent - Primary     I have personally reviewed the Medicare Annual Wellness questionnaire and have noted 1. The patient's medical and social history 2. Their use of alcohol, tobacco or illicit drugs 3. Their current medications and supplements 4. The patient's functional ability including ADL's, fall risks, home safety risks and hearing or visual impairment. 5. Diet and physical activity 6. Evidence for depression or mood disorders The patients weight, height, BMI have been recorded in the chart.  Hearing and vision has been addressed. I have made referrals, counseling and provided education to the patient based review of the above and I have provided the pt with a written personalized care plan for  preventive services. Provider list updated - see scanned questionairre. Advanced directives discussed: handout provided. Full code but no prolonged life support. Would want daughter in [place] to be HCPOA.  Reviewed preventative protocols and updated unless pt declined.    IDA (iron deficiency anemia)     Recheck CBC today.    Relevant Orders      CBC with Differential      Ferritin   HTN (hypertension)     Chronic, stable. Continue regimen.    Relevant Orders      Comprehensive metabolic panel   HLD (hyperlipidemia)     check FLP today.    Relevant Orders      Comprehensive metabolic panel      Lipid panel    Other Visit Diagnoses   Other screening mammogram        Relevant Orders       MM DIGITAL SCREENING BILATERAL        Follow up plan: Return in about 6 months (around 09/20/2014), or as needed, for follow up visit.

## 2014-03-20 NOTE — Assessment & Plan Note (Signed)
Chronic, stable. Continue regimen. 

## 2014-03-20 NOTE — Patient Instructions (Addendum)
Call your insurance about the shingles shot to see if it is covered or how much it would cost and where is cheaper (here or pharmacy).  If you want to receive here, call for nurse visit.  Prevnar today Pass by [facility]'s office to schedule mammogram Schedule eye exam as you're due. Blood work today Keep appointment with Dr. Gabriel Carina for next week. Script for rolling walker today. Good to see you today, call us with questions.

## 2014-03-20 NOTE — Addendum Note (Signed)
Addended by: Tammi Sou on: 03/20/2014 12:26 PM   Modules accepted: Orders

## 2014-03-20 NOTE — Assessment & Plan Note (Signed)
On methimazole. Pending appt with endo next week. Check TSH, free T4 today.

## 2014-03-20 NOTE — Assessment & Plan Note (Signed)
Recheck CBC today. 

## 2014-03-20 NOTE — Assessment & Plan Note (Signed)
check FLP today.

## 2014-03-20 NOTE — Progress Notes (Signed)
Pre visit review using our clinic review tool, if applicable. No additional management support is needed unless otherwise documented below in the visit note. 

## 2014-03-20 NOTE — Assessment & Plan Note (Signed)
Recheck today. Pt reports taking 50,000 IU weekly.

## 2014-03-23 ENCOUNTER — Other Ambulatory Visit: Payer: Self-pay | Admitting: Family Medicine

## 2014-03-23 ENCOUNTER — Telehealth: Payer: Self-pay | Admitting: Family Medicine

## 2014-03-23 MED ORDER — COENZYME Q10 30 MG PO CAPS: 30.0000 mg | ORAL_CAPSULE | Freq: Every day | ORAL | Status: DC

## 2014-03-23 MED ORDER — ATORVASTATIN CALCIUM 40 MG PO TABS: 40.0000 mg | ORAL_TABLET | Freq: Every day | ORAL | Status: DC

## 2014-03-23 NOTE — Telephone Encounter (Signed)
Relevant patient education mailed to patient.  

## 2014-03-24 ENCOUNTER — Other Ambulatory Visit: Payer: Self-pay | Admitting: Family Medicine

## 2014-03-24 NOTE — Telephone Encounter (Signed)
Ok to refill 

## 2014-03-25 ENCOUNTER — Other Ambulatory Visit: Payer: Self-pay | Admitting: Family Medicine

## 2014-03-25 NOTE — Telephone Encounter (Signed)
Ok to refill 

## 2014-03-31 ENCOUNTER — Encounter: Payer: Self-pay | Admitting: Family Medicine

## 2014-04-11 HISTORY — PX: OTHER SURGICAL HISTORY: SHX169

## 2014-04-22 ENCOUNTER — Ambulatory Visit: Payer: Self-pay

## 2014-04-22 ENCOUNTER — Ambulatory Visit: Payer: Medicare HMO

## 2014-04-24 ENCOUNTER — Ambulatory Visit: Payer: Self-pay

## 2014-04-27 ENCOUNTER — Other Ambulatory Visit: Payer: Self-pay | Admitting: *Deleted

## 2014-04-27 MED ORDER — TRAMADOL-ACETAMINOPHEN 37.5-325 MG PO TABS: 1.0000 | ORAL_TABLET | Freq: Three times a day (TID) | ORAL | Status: DC | PRN

## 2014-04-27 NOTE — Telephone Encounter (Signed)
plz phone in. 

## 2014-04-27 NOTE — Telephone Encounter (Signed)
Ok to refill to mail order? 

## 2014-04-28 ENCOUNTER — Other Ambulatory Visit: Payer: Self-pay

## 2014-04-28 MED ORDER — ONDANSETRON HCL 4 MG PO TABS: ORAL_TABLET | ORAL | Status: DC

## 2014-04-28 NOTE — Telephone Encounter (Signed)
Rx called in as directed.   

## 2014-04-28 NOTE — Telephone Encounter (Signed)
Pt request refill ondansetron to [facility]; pt takes when takes tramadol.

## 2014-04-30 ENCOUNTER — Encounter: Payer: Self-pay | Admitting: Family Medicine

## 2014-05-27 ENCOUNTER — Encounter: Payer: Self-pay | Admitting: Family Medicine

## 2014-06-13 ENCOUNTER — Other Ambulatory Visit: Payer: Self-pay | Admitting: Family Medicine

## 2014-06-15 NOTE — Telephone Encounter (Signed)
Ok to refill 

## 2014-07-27 ENCOUNTER — Other Ambulatory Visit: Payer: Self-pay | Admitting: Family Medicine

## 2014-07-27 NOTE — Telephone Encounter (Signed)
Ok to refill 

## 2014-08-14 ENCOUNTER — Other Ambulatory Visit: Payer: Self-pay | Admitting: Family Medicine

## 2014-08-14 NOTE — Telephone Encounter (Signed)
Ok to refill 

## 2014-09-01 ENCOUNTER — Other Ambulatory Visit: Payer: Self-pay | Admitting: Family Medicine

## 2014-09-01 NOTE — Telephone Encounter (Signed)
Ok to refill in Dr. Synthia Innocent absence? Last filled 08/14/14 #40 with 0RF

## 2014-09-02 NOTE — Telephone Encounter (Signed)
Sent.  F/u with PCP prn.  Thanks.

## 2014-09-21 ENCOUNTER — Encounter: Payer: Self-pay | Admitting: Family Medicine

## 2014-09-21 ENCOUNTER — Ambulatory Visit (INDEPENDENT_AMBULATORY_CARE_PROVIDER_SITE_OTHER): Payer: 59 | Admitting: Family Medicine

## 2014-09-21 VITALS — BP 136/84 | HR 72 | Temp 98.3°F | Wt 192.5 lb

## 2014-09-21 DIAGNOSIS — E0520 Thyrotoxicosis with toxic multinodular goiter without thyrotoxic crisis or storm: Secondary | ICD-10-CM

## 2014-09-21 DIAGNOSIS — K2100 Gastro-esophageal reflux disease with esophagitis, without bleeding: Secondary | ICD-10-CM

## 2014-09-21 DIAGNOSIS — E785 Hyperlipidemia, unspecified: Secondary | ICD-10-CM

## 2014-09-21 DIAGNOSIS — I10 Essential (primary) hypertension: Secondary | ICD-10-CM

## 2014-09-21 DIAGNOSIS — Z23 Encounter for immunization: Secondary | ICD-10-CM

## 2014-09-21 DIAGNOSIS — Z7189 Other specified counseling: Secondary | ICD-10-CM

## 2014-09-21 DIAGNOSIS — E559 Vitamin D deficiency, unspecified: Secondary | ICD-10-CM

## 2014-09-21 DIAGNOSIS — N3946 Mixed incontinence: Secondary | ICD-10-CM

## 2014-09-21 DIAGNOSIS — R32 Unspecified urinary incontinence: Secondary | ICD-10-CM

## 2014-09-21 LAB — COMPREHENSIVE METABOLIC PANEL
ALBUMIN: 4.1 g/dL (ref 3.5–5.2)
ALK PHOS: 75 U/L (ref 39–117)
ALT: 10 U/L (ref 0–35)
AST: 14 U/L (ref 0–37)
BILIRUBIN TOTAL: 0.5 mg/dL (ref 0.2–1.2)
BUN: 10 mg/dL (ref 6–23)
CO2: 26 mEq/L (ref 19–32)
CREATININE: 1 mg/dL (ref 0.4–1.2)
Calcium: 9.6 mg/dL (ref 8.4–10.5)
Chloride: 105 mEq/L (ref 96–112)
GFR: 72.33 mL/min (ref >60.00)
Glucose, Bld: 99 mg/dL (ref 70–99)
Potassium: 4.7 mEq/L (ref 3.5–5.1)
Sodium: 138 mEq/L (ref 135–145)
Total Protein: 7.9 g/dL (ref 6.0–8.3)

## 2014-09-21 LAB — TSH: TSH: 1.53 u[IU]/mL (ref 0.35–4.50)

## 2014-09-21 LAB — LIPID PANEL
CHOLESTEROL: 303 mg/dL — AB (ref 0–200)
HDL: 47.9 mg/dL (ref >39.00)
LDL Cholesterol: 232 mg/dL — ABNORMAL HIGH (ref 0–99)
NonHDL: 255.1
TRIGLYCERIDES: 118 mg/dL (ref 0.0–149.0)
Total CHOL/HDL Ratio: 6
VLDL: 23.6 mg/dL (ref 0.0–40.0)

## 2014-09-21 LAB — CK: CK TOTAL: 79 U/L (ref 7–177)

## 2014-09-21 LAB — VITAMIN D 25 HYDROXY (VIT D DEFICIENCY, FRACTURES): VITD: 17.73 ng/mL — AB (ref 30.00–100.00)

## 2014-09-21 MED ORDER — ALBUTEROL SULFATE HFA 108 (90 BASE) MCG/ACT IN AERS: 2.0000 | INHALATION_SPRAY | Freq: Four times a day (QID) | RESPIRATORY_TRACT | Status: DC | PRN

## 2014-09-21 MED ORDER — TRAZODONE HCL 100 MG PO TABS: ORAL_TABLET | ORAL | Status: DC

## 2014-09-21 MED ORDER — ESOMEPRAZOLE MAGNESIUM 40 MG PO CPDR
40.0000 mg | DELAYED_RELEASE_CAPSULE | Freq: Every day | ORAL | Status: DC
Start: 2014-09-21 — End: 2015-03-22

## 2014-09-21 MED ORDER — ESTROGENS, CONJUGATED 0.625 MG/GM VA CREA: TOPICAL_CREAM | VAGINAL | Status: DC

## 2014-09-21 MED ORDER — ATORVASTATIN CALCIUM 40 MG PO TABS: 40.0000 mg | ORAL_TABLET | ORAL | Status: DC

## 2014-09-21 MED ORDER — CLONIDINE HCL 0.1 MG PO TABS: 0.1000 mg | ORAL_TABLET | Freq: Two times a day (BID) | ORAL | Status: DC | PRN

## 2014-09-21 MED ORDER — GABAPENTIN 300 MG PO CAPS: ORAL_CAPSULE | ORAL | Status: DC

## 2014-09-21 MED ORDER — SUCRALFATE 1 G PO TABS: 1.0000 g | ORAL_TABLET | Freq: Two times a day (BID) | ORAL | Status: DC

## 2014-09-21 MED ORDER — OXYBUTYNIN CHLORIDE ER 10 MG PO TB24: ORAL_TABLET | ORAL | Status: DC

## 2014-09-21 MED ORDER — RANITIDINE HCL 300 MG PO TABS: 300.0000 mg | ORAL_TABLET | Freq: Every day | ORAL | Status: DC

## 2014-09-21 MED ORDER — AMLODIPINE BESYLATE 10 MG PO TABS: 10.0000 mg | ORAL_TABLET | Freq: Every day | ORAL | Status: DC

## 2014-09-21 MED ORDER — ONDANSETRON HCL 4 MG PO TABS: ORAL_TABLET | ORAL | Status: DC

## 2014-09-21 MED ORDER — METOPROLOL TARTRATE 25 MG PO TABS: ORAL_TABLET | ORAL | Status: DC

## 2014-09-21 NOTE — Assessment & Plan Note (Signed)
Advanced directive: has not set up. Packet provided at last AMW. Would want daughter in [place] to be HCPOA. Currently would want full code

## 2014-09-21 NOTE — Patient Instructions (Signed)
Flu shot today. Good to see you. Let's try lower carafate dose. I've refilled neurontin. Return as needed or in 6 months for medicare wellness visit. Blood work today.

## 2014-09-21 NOTE — Assessment & Plan Note (Signed)
See above

## 2014-09-21 NOTE — Progress Notes (Signed)
BP 136/84 mmHg  Pulse 72  Temp(Src) 98.3 F (36.8 C) (Oral)  Wt 192 lb 8 oz (87.317 kg)   CC: 6 mo f/u  Subjective:    Patient ID: Brianna Woods, female    DOB: [birth year], 80 y.o.   MRN: [id number]  HPI: Brianna Woods is a 80 y.o. female presenting on 09/21/2014 for Follow-up   Graves disease with hyperthyroidism on methimazole s/p radioactive iodine ablation 04/2014 (Dr Gabriel Carina). Now off methimazole. Has lost 7 lbs.   Off neurontin 300mg  for last month. Requests refill.  HTN - compliant with metoprolol 25mg  twice daily, clonidine 0.1mg  twice daily and amlodipine 10mg  daily. No HA, vision changes, CP/tightness, SOB, leg swelling.  HLD - compliant with lipitor 40mg  QD. Makes her feel ill with myalgias despite taking coq10.  Bladder - oxybutynin working well.  ultracet causes nausea so she takes takes zofran with this.   H/o barrett's esophagus s/p normal EGD 2013 (oh) rec f/u 11/2016.   Relevant past medical, surgical, family and social history reviewed and updated as indicated. Interim medical history since our last visit reviewed. Allergies and medications reviewed and updated. Current Outpatient Prescriptions on File Prior to Visit  Medication Sig  . cholecalciferol (VITAMIN D) 1000 UNITS tablet Take 5,000 Units by mouth daily.  Marland Kitchen co-enzyme Q-10 30 MG capsule Take 1 capsule (30 mg total) by mouth daily.  . diclofenac sodium (VOLTAREN) 1 % GEL Apply 4 g topically 4 (four) times daily as needed.  . ferrous sulfate 325 (65 FE) MG tablet Take 1 tablet (325 mg total) by mouth daily with breakfast.  . traMADol-acetaminophen (ULTRACET) 37.5-325 MG per tablet Take 1 tablet by mouth every 8 (eight) hours as needed.  . [DISCONTINUED] pirbuterol (MAXAIR) 200 MCG/INH inhaler Inhale 2 puffs into the lungs 4 (four) times daily.   No current facility-administered medications on file prior to visit.    Review of Systems Per HPI unless specifically indicated above     Objective:    BP  136/84 mmHg  Pulse 72  Temp(Src) 98.3 F (36.8 C) (Oral)  Wt 192 lb 8 oz (87.317 kg)  Wt Readings from Last 3 Encounters:  09/21/14 192 lb 8 oz (87.317 kg)  03/20/14 199 lb 4 oz (90.379 kg)  12/19/13 203 lb 8 oz (92.307 kg)    Physical Exam  Constitutional: She appears well-developed and well-nourished. No distress.  HENT:  Mouth/Throat: Oropharynx is clear and moist. No oropharyngeal exudate.  Eyes: Conjunctivae and EOM are normal. Pupils are equal, round, and reactive to light. No scleral icterus.  Neck: Normal range of motion. Neck supple. Thyromegaly (diminished, but persistent L TM) present.  Cardiovascular: Normal rate, regular rhythm, normal heart sounds and intact distal pulses.   No murmur heard. Pulmonary/Chest: Effort normal and breath sounds normal. No respiratory distress. She has no wheezes. She has no rales.  Musculoskeletal: She exhibits no edema.  Lymphadenopathy:    She has no cervical adenopathy.  Skin: Skin is warm and dry. No rash noted.  Psychiatric: She has a normal mood and affect.  Nursing note and vitals reviewed.      Assessment & Plan:   Problem List Items Addressed This Visit    Vitamin D deficiency    rec continue 5000 IU daily.    Relevant Orders      Vit D  25 hydroxy (rtn osteoporosis monitoring)   Urinary incontinence    Oxybutynin XL working very well for her. Continues premarin cream as well.  Relevant Medications      oxybutynin (DITROPAN-XL) 24 hr  tablet   Nodular goiter, toxic or with hyperthyroidism    Very stable off methimazole s/p ablation by Dr Gabriel Carina. Pt satisfied with care. Appreciate endo care. check TSH today.    Relevant Medications      metoprolol tartrate (LOPRESSOR) tablet   Other Relevant Orders      TSH   Mixed incontinence    See above.    Relevant Medications      oxybutynin (DITROPAN-XL) 24 hr  tablet   HTN (hypertension)    Chronic, stable. Continue regimen.    Relevant Medications      metoprolol  tartrate (LOPRESSOR) tablet      cloNIDine (CATAPRES) tablet      amLODIpine (NORVASC) tablet      atorvastatin (LIPITOR) tablet   HLD (hyperlipidemia) - Primary    Atorvastatin causing myalgias despite CoQ10. Decrease to MWF which pt tolerates better. S/p 6 wks on lower dose. Check FLP today.    Relevant Medications      metoprolol tartrate (LOPRESSOR) tablet      cloNIDine (CATAPRES) tablet      amLODIpine (NORVASC) tablet      atorvastatin (LIPITOR) tablet   Other Relevant Orders      Lipid panel      Comprehensive metabolic panel      CK   GERD (gastroesophageal reflux disease)    Continue nexium 40mg  daily, zantac 300mg  nightly. Continue sucralfate but try lower dose at BID.    Relevant Medications      sucralfate (CARAFATE) 1 G tablet      ranitidine (ZANTAC) tablet      esomeprazole (NEXIUM) capsule      ondansetron (ZOFRAN) tablet   Advanced care planning/counseling discussion    Advanced directive: has not set up. Packet provided at last AMW. Would want daughter in [place] to be HCPOA. Currently would want full code     Other Visit Diagnoses    Need for influenza vaccination        Relevant Orders       Flu Vaccine QUAD 36+ mos PF IM (Fluarix Quad PF) (Completed)        Follow up plan: Return in about 6 months (around 03/22/2015), or as needed, for medicare wellness.

## 2014-09-21 NOTE — Assessment & Plan Note (Signed)
Very stable off methimazole s/p ablation by Dr Gabriel Carina. Pt satisfied with care. Appreciate endo care. check TSH today.

## 2014-09-21 NOTE — Assessment & Plan Note (Signed)
Chronic, stable. Continue regimen. 

## 2014-09-21 NOTE — Progress Notes (Signed)
Pre visit review using our clinic review tool, if applicable. No additional management support is needed unless otherwise documented below in the visit note. 

## 2014-09-21 NOTE — Assessment & Plan Note (Signed)
rec continue 5000 IU daily.

## 2014-09-21 NOTE — Assessment & Plan Note (Signed)
Continue nexium 40mg  daily, zantac 300mg  nightly. Continue sucralfate but try lower dose at BID.

## 2014-09-21 NOTE — Assessment & Plan Note (Signed)
Atorvastatin causing myalgias despite CoQ10. Decrease to MWF which pt tolerates better. S/p 6 wks on lower dose. Check FLP today.

## 2014-09-21 NOTE — Assessment & Plan Note (Signed)
Oxybutynin XL working very well for her. Continues premarin cream as well.

## 2014-10-05 ENCOUNTER — Other Ambulatory Visit: Payer: Self-pay | Admitting: *Deleted

## 2014-10-05 MED ORDER — DICLOFENAC SODIUM 1 % TD GEL: 4.0000 g | Freq: Four times a day (QID) | TRANSDERMAL | Status: DC | PRN

## 2014-10-05 MED ORDER — VITAMIN D3 125 MCG (5000 UT) PO TABS: 5000.0000 [IU] | ORAL_TABLET | Freq: Every day | ORAL | Status: DC

## 2014-10-05 MED ORDER — ATORVASTATIN CALCIUM 40 MG PO TABS: 40.0000 mg | ORAL_TABLET | ORAL | Status: DC

## 2014-10-05 NOTE — Telephone Encounter (Signed)
Per our last note - pt should be on atorvastatin MWF, not crestor. She was to take vitamin D 5,000 IU daily not 50,000 weekly. 5,000 IU daily should be OTC but I've sent prescription just in case. voltaren gel refilled.

## 2014-10-05 NOTE — Telephone Encounter (Addendum)
Pt left message on voicemail that she was seen recently and that she didn't get rx's for meds ( Crestor, [place], [place], and Coq10) that were supposed to be refilled. Coq10 is otc, but is it ok to refill the others? What dose of Crestor is pt to start?

## 2014-10-06 NOTE — Telephone Encounter (Signed)
Attempted to call patient numerous times-phone remained busy. Will try again later.

## 2014-10-07 NOTE — Telephone Encounter (Signed)
Attempted to call patient. No answer, no machine. Will try again later. Per last office note-methimazole has been stopped. Questions about that need to go to Dr. Gabriel Carina (endo)

## 2014-10-07 NOTE — Telephone Encounter (Signed)
Pt notified as instructed and pt voiced understanding.  Pt also wanted to verify that she is not to take Methimazole. Pt  Request cb.

## 2014-10-07 NOTE — Telephone Encounter (Signed)
Pt had ablation. Doesn't need methimazole anymore Lab Results  Component Value Date   TSH 1.53 09/21/2014

## 2014-10-08 NOTE — Telephone Encounter (Signed)
Patient notified and verbalized understanding. 

## 2014-11-12 ENCOUNTER — Other Ambulatory Visit: Payer: Self-pay | Admitting: Family Medicine

## 2014-11-12 NOTE — Telephone Encounter (Signed)
plz phone in. 

## 2014-11-13 NOTE — Telephone Encounter (Signed)
Rx called in as directed.   

## 2014-12-25 ENCOUNTER — Other Ambulatory Visit: Payer: Self-pay | Admitting: Family Medicine

## 2014-12-29 NOTE — H&P (Signed)
PATIENT NAME:  Brianna Woods, Brianna Woods MR#:  [id number] DATE OF BIRTH:  [birth year]  DATE OF ADMISSION:  06/12/2012  PRIMARY CARE PHYSICIAN: Dr. Danise Mina   ER PHYSICIAN: Dr. Renard Hamper   ADMITTING PHYSICIAN: Dr. Pearletha Furl   PRESENTING COMPLAINT: Abdominal pain with diarrhea.   HISTORY OF PRESENT ILLNESS: The patient is a 80 year old lady who presented with complaint of severe abdominal pain and diarrhea over the last two days. Symptoms started yesterday with severe abdominal pain which was sudden onset in nature associated with nausea but no vomiting. Had several episodes of loose bowel movements which were nonbloody but had generalized weakness. Pain intensity was 62/95, colicky in nature. No known aggravating or relieving factors. For this she presented to the Emergency Room today where she was referred to the hospital following work-up which included a CT of the abdomen involving enteritis or early partial small bowel obstruction and large hiatal hernia. The patient denies any recent change in medication. No long distance travel. No sick contacts. No recent antibiotic use over the last six weeks, however, she admitted eating some bad food two days ago. Denies any chest pain, PND, orthopnea, or pedal edema.    REVIEW OF SYSTEMS: CONSTITUTIONAL: Positive for fatigue. Denies any fever or weight loss. Positive for generalized pain. EYES: No blurred vision, redness, discharge. ENT: No tinnitus, epistaxis, or difficulty swallowing. RESPIRATORY: Denies cough, wheezing. CARDIOVASCULAR: Denies chest pain, palpitations, or syncopal episode. GI: Positive for nausea and abdominal pain. Denies any vomiting. No hematemesis. Has change in bowel habits with diarrhea. GU: No dysuria, frequency, or incontinence. ENDOCRINE: No polyuria, polydipsia, heat or cold intolerance. HEMATOLOGIC: No anemia, easy bruising, bleeding, or swollen glands. SKIN: No rashes, change in hair or skin texture. MUSCULOSKELETAL: No joint pain, redness, swelling,  or limited activity. NEUROLOGIC: No numbness, weakness, headaches, dementia, memory loss. PSYCH: No anxiety, depression.   PAST MEDICAL HISTORY:  1. Hypertension.  2. Gastroesophageal reflux disease.  3. Barrett's esophagitis. 4. Hiatal hernia.   5. Asthma.  6. Fibromyalgia.  7. Insomnia.   PAST SURGICAL HISTORY:  1. Cholecystectomy three years ago. 2. EGD six months ago.   SOCIAL HISTORY: Lives at home alone. No alcohol, tobacco, or recreational drug use. Retired Engineering geologist.   FAMILY HISTORY: Positive for CVA in son who died of massive stroke, hypertension, and diabetes. Denies any history of coronary artery disease in the family.   ALLERGIES: IV dye and penicillin.   MEDICATIONS:  1. Acetaminophen/tramadol 1 tablet q.8 hours p.r.n.  2. Catapres 0.1 mg twice daily p.r.n.  3. Neurontin 300 mg 3 times daily.  4. Trazodone 50 mg once a day.  5. Zofran 4 mg every twelve hours p.r.n. nausea. 6. Pravachol 40 mg at bedtime. 7. Tapazole 5 mg once a day  8. Metoprolol tartrate 25 mg twice daily.  9. Ventolin inhaler 2 puffs q.6 p.r.n. for wheezing.  10. Amlodipine 10 mg daily.  11. Ranitidine 300 mg once a day at bedtime.  12. Ferrous sulfate 325 mg daily.  13. Carafate 1 gram 4 times daily before meals and at bedtime.  14. Protonix 40 mg daily.  15. Premarin 0.625 mg vaginal cream 3 times weekly. 16. Oxybutynin 10 mg daily.  17. Ergocalciferol 50,000 units once a week.    PHYSICAL EXAMINATION:   VITAL SIGNS: Temperature 99.4, pulse 81, respiratory rate 20, blood pressure 134/53, oxygen sat 98% on room air. EKG showed normal sinus rhythm, right bundle branch block, 86 heart rate.   GENERAL: Elderly obese lady  lying on the gurney awake, alert, oriented to time, place, and person in no distress.   HEENT: Atraumatic, normocephalic. Pupils equal, reactive to light and accommodation. Extraocular movement intact. Mucous membranes pink, moist.   NECK: Supple. No  JV distention.   CHEST: Good air entry. Clear to auscultation.   HEART: Regular rate and rhythm. No murmur.   ABDOMEN: Full, moves with respiration. Diffuse tenderness. No rebound. No guarding. Bowel sounds hypoactive.   EXTREMITIES: No edema. No clubbing. No deformity.   NEUROLOGICAL: No focal deficits.   PSYCH: Affect appropriate to situation.   LABORATORY, DIAGNOSTIC, AND RADIOLOGICAL DATA: EKG shows normal sinus rhythm at 83 with right bundle branch block.   CT of abdomen and pelvis showed evolving enteritis or early partial small bowel obstruction with large hiatal hernia.   CBC white count 19 up from 13 from nine months ago, hemoglobin 12, platelets 244. Chemistry unremarkable except for potassium of 3 down from 3.2 from nine months ago. Creatinine 0.9, BUN 8, glucose 112, calcium 9.3, sodium 141. Magnesium 1.6. Lipase 64. Alkaline phosphatase elevated at 153 from previous level of 120. CK 81. Troponin negative. Urinalysis is negative. Lactic acid is elevated at 2.6.   IMPRESSION:  1. Enteritis, rule out small bowel obstruction.  2. Hypertension.  3. Gastroesophageal reflux disease.  4. Hiatal hernia.  5. Hypokalemia, not otherwise specified.  6. Leukocytosis, likely from enteritis.  7. Hypothyroidism.   PLAN:  1. Admit to general medical floor under observation for serial cardiac enzymes.  2. Check TSH.  3. Will replete magnesium and potassium.  4. IV fluid for rehydration.  5. N.p.o. for now. 6. Pain control.  7. GI prophylaxis with Protonix.  8. DVT prophylaxis with Lovenox. 9. Stool cultures and check for Clostridium difficile. 10. Empiric antibiotics with Flagyl and ciprofloxacin.  11. CODE STATUS. FULL CODE.  12. Resume rest of outpatient medications and adjust as needed.   At this stage the patient will be discharged in the next 24 to 48 hours.   TOTAL PATIENT CARE TIME: 50 minutes.   ____________________________ Jules Husbands Pearletha Furl,  MD mia:drc D: 06/12/2012 00:58:01 ET T: 06/12/2012 06:44:01 ET JOB#: [id number]  cc: Kendre Jacinto I. Pearletha Furl, MD, <Dictator> Ria Bush, MD [place] SIGNED 06/14/2012 0:35

## 2014-12-29 NOTE — Discharge Summary (Signed)
PATIENT NAME:  Brianna Woods, Brianna Woods MR#:  [id number] DATE OF BIRTH:  02-Sep-1935  DATE OF ADMISSION:  06/12/2012 DATE OF DISCHARGE:  06/13/2012  PRIMARY CARE PHYSICIAN:  Dr. Danise Mina  CONSULTATION:  Dr. Marina Gravel  FINAL DIAGNOSES: 1. Enteritis. 2. Leukocytosis.  3. Hypertension. 4. Gastroesophageal reflux disease. 5. Asthma.   CODE STATUS: FULL CODE.   HOME MEDICATIONS:  1. Neurontin 300 mg p.o. t.i.d. 2. Nexium 40 mg p.o. daily.  3. Premarin 0.625 mg per gram vaginal cream with applicator, one application vaginal three times a week. 4. Lopressor 25 mg p.o. b.i.d.  5. Oxybutynin 10 mg per 24 hours p.o. tablet, 1 tab p.o. daily.  6. Carafate 1 gram p.o. 4 times a day before meals and at bedtime.  7. Trazodone 50 mg p.o. daily.  8. Ergocalciferol 50,000 international units, 1.25-mg capsule, one cap once a week.  9. Pravachol 40 mg p.o. at bedtime.  10. Norvasc 10 mg p.o. daily.  11. Catapres 0.1 mg p.o. tablet twice a day p.r.n. 12. Ferrous sulfate 325 mg p.o. daily.  13. Tapazole 5 mg p.o. daily.  14. Zofran 4 mg p.o. tablets 1 tablet every 12 hours p.r.n.  15. Ventolin HFA 90 mcg INH inhalation 2 puffs q. 6 hours p.r.n.  16. Ranitidine 300 mg p.o. capsule one cap at bedtime.  17. Acetaminophen/tramadol 325 mg/37.5 mg p.o. tablet q. 8 hours p.r.n.  18. Flagyl 500 mg p.o. q. 8 hours for five days.  19. Cipro 500 mg p.o. tablet twice a day for five days.   DIET: Low sodium diet.   ACTIVITY: As tolerated.   FOLLOW-UP CARE: Follow up with primary care physician within 1 to 2 weeks.   REASON FOR ADMISSION: Abdominal pain and diarrhea.   HOSPITAL COURSE: The patient is a 80 year old African American female with a history of hypertension, gastroesophageal reflux disease, and asthma who presented to the ED with abdominal pain with diarrhea for two days. Symptoms were associated with nausea but no vomiting.  The patient had a loose bowel movement three times. CT scan of the abdomen showed  evolving enteritis or early partial small bowel obstruction and a large hiatal hernia.  The  patient was admitted for enteritis, rule out small bowel obstruction. For detailed history and physical examination, please refer to the admission note dictated by Dr. Pearletha Furl.  Since admission the patient has been treated with n.p.o. and Flagyl and Cipro with IV fluid support. The patient had no bowel movement after admission and no nausea or vomiting, but had mild abdominal pain. Surgical consult was requested.  Dr. Marina Gravel evaluated the patient and suggested no small bowel obstruction, no indication for surgery. The patient also had leukocytosis possibly due to enteritis. White count was 19.4 and decreased to normal range at 9.7 today. The patient also has a low magnesium of 1.6. After given supplement, it increased to 1.9. She restarted diet this morning. If the patient can tolerate meals she will be discharged to home today. I discussed the discharge plan with the patient, nurse, and case manager.   TIME SPENT: About 38 minutes.   ____________________________ Demetrios Loll, MD qc:bjt D: 06/13/2012 12:39:46 ET T: 06/13/2012 14:48:43 ET JOB#: [id number]  cc: Demetrios Loll, MD, <Dictator> Ria Bush, MD Demetrios Loll MD ELECTRONICALLY SIGNED 06/14/2012 14:01

## 2014-12-29 NOTE — Consult Note (Signed)
PATIENT NAME:  Brianna Woods, Brianna Woods MR#:  [id number] DATE OF BIRTH:  1934/12/13  DATE OF CONSULTATION:  06/12/2012  REFERRING PHYSICIAN:   CONSULTING PHYSICIAN:  Ming Mcmannis A. Marina Gravel, MD  REASON FOR CONSULTATION: Abdominal pain and distention; possible bowel obstruction.   HISTORY: This is a 80 year old pleasant female who was in her usual state of health until approximately one week ago when she began having some nausea, a little bit of vomiting, and some diffuse abdominal pain which then resolved. She had a good weekend. On Monday she had the sudden onset of diffuse lower to periumbilical abdominal pain in a bandlike fashion followed by some mild nausea; no vomiting. She states she's had three formed stools. Last bowel movement was yesterday morning. No bowel movement or flatus since she's been in the hospital. She denies any fever, jaundice, or sick contacts. She has never had previous pain like this. She has had a laparoscopic cholecystectomy in 2006 in [place]. The patient does not smoke and is not diabetic. She is a retired Materials engineer.   She has had really no relief over the last 24 to 48 hours except when she's been in the hospital with intravenous narcotics and hydration. She was admitted to the medical service with leukocytosis and a lactic acid of 2.6. Noncontrast CT scan demonstrates evidence of diffuse small bowel dilatation. There is no pneumatosis intestinalis. There is no free fluid. No free air. The colon appears normal in its appearance. The gallbladder is surgically absent with clips present. There is a small 2 cm uterine mass. Surgical services were asked to consult as her abdominal pain has continued.   ALLERGIES: IVP dye and penicillin.   MEDICATIONS AT HOME:  1. Acetaminophen. 2. Tramadol. 3. Amlodipine. 4. Carafate. 5. Catapres. 6. Ergocalciferol.  7. Ferrous  sulfate. 8. Metoprolol. 9. Neurontin. 10. Nexium. 11. Oxybutynin. 12. Pravachol. 13. Premarin. 14. Ranitidine. 15. Tapazole. 16. Trazodone. 17. Ventolin. 18. Zofran.  HOSPITALIZATION MEDICATIONS:  1. Intravenous Cipro. 2. Intravenous Flagyl.   PAST MEDICAL HISTORY:  1. Obesity.  2. Hypertension.  3. Hyperthyroidism.  4. History of Barrett's esophagus.  5. History of gastroesophageal reflux disease.   6. History of hiatal hernia. 7. Asthma. 8. Fibromyalgia. 9. Insomnia.   PAST SURGICAL HISTORY:  1. Laparoscopic cholecystectomy in 2006. 2. Upper endoscopy by Dr. Candace Cruise in March of 2013 demonstrating grade I esophagitis with a hiatal hernia, normal appearing duodenum.   SOCIAL HISTORY: She denies alcohol, tobacco, or IV drug use. She is a retired Materials engineer. 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   FAMILY HISTORY: Negative for coronary artery disease. Significant for stroke, hypertension, and diabetes.   REVIEW OF SYSTEMS: Review of systems demonstrates abdominal pain as described above. No fever. No jaundice. No dysuria. No sick contacts. No rash. No hematemesis. No diarrhea. She has had three formed stools yesterday. Last bowel movement yesterday morning. Remaining 10 point review is negative and as described in the history of present illness.   PHYSICAL EXAMINATION:   VITAL SIGNS: Temperature 98.7, pulse 73, respiratory rate 18, blood pressure 144/77, room air saturation 95%.   GENERAL: The patient is in no apparent distress watching television, appears comfortable, alert and oriented x4. Facies are symmetrical.   HEENT: Unremarkable. PERRLA, no adenopathy.  LUNGS: Clear.   HEART: Regular rate and rhythm.   ABDOMEN: Slightly distended, soft, minimally tender. No peritoneal signs. No mass. No hernias. Multiple scars.   SKIN: Warm and well perfused.   EXTREMITIES: Normal.   MUSCULOSKELETAL: Grossly normal.  NEUROLOGIC: Grossly normal. Alert and oriented.    PSYCHIATRIC: Appropriate mood, judgment, and affect.   LABORATORY VALUES: Glucose 112, BUN 8, creatinine 0.91, sodium 141, potassium 3.0, chloride 103, CO2 28. Magnesium 1.6. Lipase 64. Liver function tests are normal except for alkaline phosphatase of 153. TSH 0.142. White count 19.4, hemoglobin 12.4, hematocrit 37.2, platelet count 244,000. Urine culture negative. Blood culture no growth. Urinalysis negative. Lactic acid was 2.6 at 09:30 yesterday morning.  Review of CT scan is as described above that was noncontrasted. There is a 2 cm lesion seen in the lower uterine segment for which pelvic ultrasound is recommended.   IMPRESSION: This is a 80 year old female with likely infectious etiology of enteritis. She has a concomitant ileus as well accounting for her distention and no passage of flatus. This does not appear to be a mechanical bowel obstruction. I also doubt that this is C. difficile. She has had no diarrhea. I do not think that the patient requires a nasogastric tube at this time. I agree with your antibiotics and current management.  RECOMMENDATIONS:  1. Agree with Cipro and Flagyl. 2. Stool cultures if diarrhea develops.  3. Supportive care.  4. Intravenous hydration.  5. I would repeat her labs in the morning as you are doing.  6. Replete her potassium.  7. At present there are no surgical issues that I can see to become involved with.  8. I will follow along with you.   Thank you kindly for this consult.   TOTAL TIME SPENT WITH THE PATIENT: 45 minutes.   ____________________________ Jeannette How Marina Gravel, MD mab:drc D: 06/12/2012 20:12:31 ET T: 06/13/2012 10:14:12 ET JOB#: [id number]  cc: Elta Guadeloupe A. Marina Gravel, MD, <Dictator> Ria Bush, MD Raymond MD ELECTRONICALLY SIGNED 06/13/2012 23:06

## 2014-12-29 NOTE — Consult Note (Signed)
Brief Consult Note: Diagnosis: enteritis likely infectious, douby inschemic etiology no acute surgical issues at present.   Patient was seen by consultant.   Consult note dictated.   Recommend further assessment or treatment.   Comments: She had a previous viral like GI illness last week followed by this more present illness.  Known hiatal hernia.  Electronic Signatures: Sherri Rad (MD)  (Signed 02-Oct-13 20:04)  Authored: Brief Consult Note   Last Updated: 02-Oct-13 20:04 by Sherri Rad (MD)

## 2015-03-22 ENCOUNTER — Ambulatory Visit (INDEPENDENT_AMBULATORY_CARE_PROVIDER_SITE_OTHER): Payer: Medicare PPO | Admitting: Family Medicine

## 2015-03-22 ENCOUNTER — Encounter: Payer: Self-pay | Admitting: Family Medicine

## 2015-03-22 VITALS — BP 128/84 | HR 60 | Temp 98.1°F | Ht 60.0 in | Wt 192.5 lb

## 2015-03-22 DIAGNOSIS — I10 Essential (primary) hypertension: Secondary | ICD-10-CM | POA: Diagnosis not present

## 2015-03-22 DIAGNOSIS — Z7189 Other specified counseling: Secondary | ICD-10-CM

## 2015-03-22 DIAGNOSIS — D509 Iron deficiency anemia, unspecified: Secondary | ICD-10-CM | POA: Diagnosis not present

## 2015-03-22 DIAGNOSIS — E785 Hyperlipidemia, unspecified: Secondary | ICD-10-CM | POA: Diagnosis not present

## 2015-03-22 DIAGNOSIS — E0520 Thyrotoxicosis with toxic multinodular goiter without thyrotoxic crisis or storm: Secondary | ICD-10-CM | POA: Diagnosis not present

## 2015-03-22 DIAGNOSIS — L304 Erythema intertrigo: Secondary | ICD-10-CM | POA: Diagnosis not present

## 2015-03-22 DIAGNOSIS — Z0000 Encounter for general adult medical examination without abnormal findings: Secondary | ICD-10-CM

## 2015-03-22 DIAGNOSIS — E559 Vitamin D deficiency, unspecified: Secondary | ICD-10-CM

## 2015-03-22 DIAGNOSIS — Z8673 Personal history of transient ischemic attack (TIA), and cerebral infarction without residual deficits: Secondary | ICD-10-CM

## 2015-03-22 DIAGNOSIS — Z1239 Encounter for other screening for malignant neoplasm of breast: Secondary | ICD-10-CM

## 2015-03-22 LAB — CBC WITH DIFFERENTIAL/PLATELET
BASOS ABS: 0 10*3/uL (ref 0.0–0.1)
BASOS PCT: 0.4 % (ref 0.0–3.0)
EOS ABS: 0.2 10*3/uL (ref 0.0–0.7)
EOS PCT: 2.1 % (ref 0.0–5.0)
HEMATOCRIT: 36.9 % (ref 36.0–46.0)
Hemoglobin: 12.1 g/dL (ref 12.0–15.0)
Lymphocytes Relative: 24.5 % (ref 12.0–46.0)
Lymphs Abs: 2.1 10*3/uL (ref 0.7–4.0)
MCHC: 32.9 g/dL (ref 30.0–36.0)
MCV: 91.7 fl (ref 78.0–100.0)
MONOS PCT: 6.4 % (ref 3.0–12.0)
Monocytes Absolute: 0.5 10*3/uL (ref 0.1–1.0)
Neutro Abs: 5.6 10*3/uL (ref 1.4–7.7)
Neutrophils Relative %: 66.6 % (ref 43.0–77.0)
Platelets: 231 10*3/uL (ref 150.0–400.0)
RBC: 4.02 Mil/uL (ref 3.87–5.11)
RDW: 14.9 % (ref 11.5–15.5)
WBC: 8.4 10*3/uL (ref 4.0–10.5)

## 2015-03-22 LAB — T3: T3, Total: 117.1 ng/dL (ref 80.0–204.0)

## 2015-03-22 LAB — BASIC METABOLIC PANEL
BUN: 8 mg/dL (ref 6–23)
CO2: 28 meq/L (ref 19–32)
CREATININE: 0.77 mg/dL (ref 0.40–1.20)
Calcium: 9.8 mg/dL (ref 8.4–10.5)
Chloride: 105 mEq/L (ref 96–112)
GFR: 93.17 mL/min (ref >60.00)
Glucose, Bld: 100 mg/dL — ABNORMAL HIGH (ref 70–99)
Potassium: 4.1 mEq/L (ref 3.5–5.1)
Sodium: 141 mEq/L (ref 135–145)

## 2015-03-22 LAB — LIPID PANEL
Cholesterol: 193 mg/dL (ref 0–200)
HDL: 49.1 mg/dL (ref >39.00)
LDL Cholesterol: 122 mg/dL — ABNORMAL HIGH (ref 0–99)
NonHDL: 143.9
TRIGLYCERIDES: 112 mg/dL (ref 0.0–149.0)
Total CHOL/HDL Ratio: 4
VLDL: 22.4 mg/dL (ref 0.0–40.0)

## 2015-03-22 LAB — T4, FREE: Free T4: 0.89 ng/dL (ref 0.60–1.60)

## 2015-03-22 LAB — IBC PANEL
Iron: 51 ug/dL (ref 42–145)
Saturation Ratios: 14.7 % — ABNORMAL LOW (ref 20.0–50.0)
TRANSFERRIN: 247 mg/dL (ref 212.0–360.0)

## 2015-03-22 LAB — FERRITIN: FERRITIN: 49.2 ng/mL (ref 10.0–291.0)

## 2015-03-22 LAB — VITAMIN D 25 HYDROXY (VIT D DEFICIENCY, FRACTURES): VITD: 35.72 ng/mL (ref 30.00–100.00)

## 2015-03-22 LAB — TSH: TSH: 1.01 u[IU]/mL (ref 0.35–4.50)

## 2015-03-22 MED ORDER — FERROUS SULFATE 325 (65 FE) MG PO TABS: 325.0000 mg | ORAL_TABLET | Freq: Two times a day (BID) | ORAL | Status: DC

## 2015-03-22 MED ORDER — TRAZODONE HCL 100 MG PO TABS: ORAL_TABLET | ORAL | Status: DC

## 2015-03-22 MED ORDER — TRAMADOL-ACETAMINOPHEN 37.5-325 MG PO TABS: 1.0000 | ORAL_TABLET | Freq: Three times a day (TID) | ORAL | Status: DC | PRN

## 2015-03-22 MED ORDER — NYSTATIN 100000 UNIT/GM EX CREA: 1.0000 "application " | TOPICAL_CREAM | Freq: Two times a day (BID) | CUTANEOUS | Status: DC

## 2015-03-22 MED ORDER — ESOMEPRAZOLE MAGNESIUM 40 MG PO CPDR: 40.0000 mg | DELAYED_RELEASE_CAPSULE | Freq: Every day | ORAL | Status: DC

## 2015-03-22 NOTE — Assessment & Plan Note (Signed)
Recheck today. On 5000 IU daily.

## 2015-03-22 NOTE — Assessment & Plan Note (Signed)
rec restart nystatin cream for breast intertrigo.

## 2015-03-22 NOTE — Assessment & Plan Note (Signed)
Advanced directive: has not set up. Has discussed with family her wishes. Packet provided today. Would want Brianna Woods daughter in [place] to be HCPOA. Currently wants full code but no prolonged life support if terminal

## 2015-03-22 NOTE — Progress Notes (Addendum)
BP 128/84 mmHg  Pulse 60  Temp(Src) 98.1 F (36.7 C) (Oral)  Ht 5' (1.524 m)  Wt 192 lb 8 oz (87.317 kg)  BMI 37.59 kg/m2   CC: medicare wellness visit  Subjective:    Patient ID: Brianna Woods, female    DOB: [birth year], 80 y.o.   MRN: [id number]  HPI: Brianna Woods is a 80 y.o. female presenting on 03/22/2015 for Annual Exam   Fasting today.  Followed by Dr Gabriel Carina for multinodular goiter with subclinical hyperthyroidism on long term methimazole.   Passes hearing and vision screens today. No longer drives Denies depression, anhedonia, sadness  Persistently increased fall risk - 2 in last year - states she feels lightheaded then legs get weak and she falls. Uses walker regularly. No vertigo, no presyncope. Declines PT referral. Will try to slow down movement speed.   Preventative:  ESOPHAGOGASTRODUODENOSCOPY Date: 11/2011 LA grade A esophagitis lower 1/3, dilated, med HH, o/w WNL - path: + GERD, no barrett's - f/u 11/2016  COLONOSCOPY Date: 10/02/12 diverticulosis, o/w WNL ([place])  Last mammogram 10 yrs ago - requests in [place]. Agrees to clinical breast exam. Well woman - last done 2012. H/o calcified benign ovarian cyst.  DEXA - in [place] normal per patient. Tetanus - 2008  Pneumonia shot - done 2010. prevnar today.  Shingles shot - doesn't think would want.  Advanced directive: has not set up. Has discussed with family her wishes. Packet provided today. Would want Jannet Mantis daughter in [place] to be HCPOA. Currently wants full code but no prolonged life support if terminal. 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 Interim medical history since our last visit reviewed. Allergies and medications reviewed and updated. Current Outpatient Prescriptions on File Prior to Visit  Medication Sig  . albuterol (VENTOLIN HFA) 108 (90 BASE) MCG/ACT inhaler Inhale 2 puffs into the lungs every 6 (six) hours as needed. for wheezing  . amLODipine (NORVASC) 10 MG tablet Take 1 tablet (10 mg total) by mouth daily.  Marland Kitchen atorvastatin (LIPITOR) 40 MG tablet Take 1 tablet (40 mg total) by mouth every Monday, Wednesday, and Friday.  . cholecalciferol 5000 UNITS TABS Take 5,000 Units by mouth daily.  . cloNIDine (CATAPRES) 0.1 MG tablet Take 1 tablet (0.1 mg total) by mouth 2 (two) times daily as needed.  . conjugated estrogens (PREMARIN) vaginal cream APPLY 1/4 APPLICATORFUL VAGINALLY THREE TIMES A WEEK  . gabapentin (NEURONTIN) 300 MG capsule TAKE 1 CAPSULE THREE TIMES DAILY  . metoprolol tartrate (LOPRESSOR) 25 MG tablet TAKE 1 TABLET TWICE DAILY  . ondansetron (ZOFRAN) 4 MG tablet TAKE 1 TABLET EVERY 12 HOURS AS NEEDED FOR NAUSEA  . oxybutynin (DITROPAN-XL) 10 MG 24 hr tablet TAKE 1 TABLET EVERY DAY  . ranitidine (ZANTAC) 300 MG tablet Take 1 tablet (300 mg total) by mouth at bedtime.  . sucralfate (CARAFATE) 1 G tablet Take 1 tablet (1 g total) by mouth 2 (two) times daily.  Marland Kitchen co-enzyme Q-10 30 MG capsule Take 1 capsule (30 mg total) by mouth daily. (Patient not taking: Reported on 03/22/2015)  . [DISCONTINUED] pirbuterol (MAXAIR) 200 MCG/INH inhaler Inhale 2 puffs  into the lungs 4 (four) times daily.   No current facility-administered medications on file prior to visit.    Review of Systems  Constitutional: Negative for fever, chills, activity change, appetite change, fatigue and unexpected weight change.  HENT: Negative for hearing loss.   Eyes: Negative for visual disturbance.  Respiratory: Negative for cough, chest tightness, shortness of breath and wheezing.   Cardiovascular: Negative for chest pain,  palpitations and leg swelling.  Gastrointestinal: Negative for nausea, vomiting, abdominal pain, diarrhea, constipation, blood in stool and abdominal distention.  Genitourinary: Negative for hematuria and difficulty urinating.  Musculoskeletal: Negative for myalgias, arthralgias and neck pain.  Skin: Negative for rash.  Neurological: Negative for dizziness, seizures, syncope and headaches.  Hematological: Negative for adenopathy. Does not bruise/bleed easily.  Psychiatric/Behavioral: Negative for dysphoric mood. The patient is not nervous/anxious.    Per HPI unless specifically indicated above     Objective:    BP 128/84 mmHg  Pulse 60  Temp(Src) 98.1 F (36.7 C) (Oral)  Ht 5' (1.524 m)  Wt 192 lb 8 oz (87.317 kg)  BMI 37.59 kg/m2  Wt Readings from Last 3 Encounters:  03/22/15 192 lb 8 oz (87.317 kg)  09/21/14 192 lb 8 oz (87.317 kg)  03/20/14 199 lb 4 oz (90.379 kg)    Physical Exam  Constitutional: She is oriented to person, place, and time. She appears well-developed and well-nourished. No distress.  HENT:  Head: Normocephalic and atraumatic.  Right Ear: Hearing, tympanic membrane, external ear and ear canal normal.  Left Ear: Hearing, tympanic membrane, external ear and ear canal normal.  Nose: Nose normal.  Mouth/Throat: Uvula is midline, oropharynx is clear and moist and mucous membranes are normal. No oropharyngeal exudate, posterior oropharyngeal edema or posterior oropharyngeal erythema.  Eyes: Conjunctivae and EOM are normal. Pupils are equal, round, and reactive to light. No scleral icterus.  Neck: Normal range of motion. Neck supple. Carotid bruit is not present. Thyromegaly present.  Cardiovascular: Normal rate, regular rhythm, normal heart sounds and intact distal pulses.   No murmur heard. Pulses:      Radial pulses are 2+ on the right side, and 2+ on the left side.  Pulmonary/Chest: Effort normal and breath sounds normal. No respiratory distress. She has no  wheezes. She has no rales. Right breast exhibits no inverted nipple, no mass, no nipple discharge, no skin change and no tenderness. Left breast exhibits no inverted nipple, no mass, no nipple discharge, no skin change and no tenderness.  Abdominal: Soft. Bowel sounds are normal. She exhibits no distension and no mass. There is no tenderness. There is no rebound and no guarding.  Genitourinary:  GYN - deferred  Musculoskeletal: Normal range of motion. She exhibits no edema.  Lymphadenopathy:       Head (right side): No submental, no submandibular, no tonsillar, no preauricular and no posterior auricular adenopathy present.       Head (left side): No submental, no submandibular, no tonsillar, no preauricular and no posterior auricular adenopathy present.    She has no cervical adenopathy.    She has no axillary adenopathy.       Right axillary: No lateral adenopathy present.       Left axillary: No lateral adenopathy present.      Right: No supraclavicular adenopathy present.       Left: No supraclavicular adenopathy present.  Neurological: She is alert and oriented to person, place, and time.  CN grossly intact, station and gait intact Recall 1/3,  3/3 with cues Calculation 5/5 serial 3s  Skin: Skin is warm and dry. Rash noted.  Erythematous rash below bilateral breasts  Psychiatric: She has a normal mood and affect. Her behavior is normal. Judgment and thought content normal.  Nursing note and vitals reviewed.      Assessment & Plan:   Problem List Items Addressed This Visit    Advanced care planning/counseling discussion    Advanced directive: has not set up. Has discussed with family her wishes. Packet provided today. Would want Jannet Mantis daughter in [place] to be HCPOA. Currently wants full code but no prolonged life support if terminal      Health maintenance examination    Preventative protocols reviewed and updated unless pt declined. Discussed healthy diet and lifestyle.         History of CVA (cerebrovascular accident)    Continue statin. Did not tolerate aspirin.      HLD (hyperlipidemia)    Tolerating atorvastatin MWF. Check FLP today. Unsure if CoQ 10 helpful, will restart and assess myalgias (?FM related)      Relevant Orders   Lipid panel   HTN (hypertension)    Chronic, stable. Continue current regimen.      Relevant Orders   Basic metabolic panel   IDA (iron deficiency anemia)    Recheck today.      Relevant Medications   ferrous sulfate 325 (65 FE) MG tablet   Other Relevant Orders   CBC with Differential/Platelet   IBC panel   Ferritin   Intertrigo    rec restart nystatin cream for breast intertrigo.      Medicare annual wellness visit, subsequent - Primary    I have personally reviewed the Medicare Annual Wellness questionnaire and have noted 1. The patient's medical and social history 2. Their use of alcohol, tobacco or illicit drugs 3. Their current medications and supplements 4. The patient's functional ability including ADL's, fall risks, home safety risks and hearing or visual impairment. Cognitive function has been assessed and addressed as indicated.  5. Diet and physical activity 6. Evidence for depression or mood disorders The patients weight, height, BMI have been recorded in the chart. I have made referrals, counseling and provided education to the patient based on review of the above and I have provided the pt with a written personalized care plan for preventive services. Provider list updated.. See scanned questionairre as needed for further documentation. Reviewed preventative protocols and updated unless pt declined.       Nodular goiter, toxic or with hyperthyroidism    Recheck thyroid panel today.      Relevant Orders   TSH   T4, free   T3   Vitamin D deficiency    Recheck today. On 5000 IU daily.      Relevant Orders   Vit D  25 hydroxy (rtn osteoporosis monitoring)    Other Visit Diagnoses     Breast cancer screening        Relevant Orders    MM DIGITAL SCREENING BILATERAL        Follow up plan: Return in about 6 months (around 09/22/2015), or as needed, for follow up visit.

## 2015-03-22 NOTE — Assessment & Plan Note (Signed)
Recheck today. 

## 2015-03-22 NOTE — Assessment & Plan Note (Signed)
Preventative protocols reviewed and updated unless pt declined. Discussed healthy diet and lifestyle.  

## 2015-03-22 NOTE — Assessment & Plan Note (Signed)
Tolerating atorvastatin MWF. Check FLP today. Unsure if CoQ 10 helpful, will restart and assess myalgias (?FM related)

## 2015-03-22 NOTE — Addendum Note (Signed)
Addended by: Ria Bush on: 03/22/2015 10:41 AM   Modules accepted: Orders, SmartSet

## 2015-03-22 NOTE — Assessment & Plan Note (Signed)

## 2015-03-22 NOTE — Addendum Note (Signed)
Addended by: Ria Bush on: 03/22/2015 12:01 PM   Modules accepted: Miquel Dunn

## 2015-03-22 NOTE — Progress Notes (Signed)
Pre visit review using our clinic review tool, if applicable. No additional management support is needed unless otherwise documented below in the visit note. 

## 2015-03-22 NOTE — Patient Instructions (Addendum)
Co-enzyme Q10 is for muscle aches. If not helping, you don't need to continue.  Pass by Marion's office to set up mammogram in [place]. Advanced directive packet provided today. Blood work today. Return as needed or in 6 months for follow up visit.

## 2015-03-22 NOTE — Assessment & Plan Note (Signed)
Recheck thyroid panel today 

## 2015-03-22 NOTE — Assessment & Plan Note (Signed)
Continue statin. Did not tolerate aspirin.

## 2015-03-22 NOTE — Assessment & Plan Note (Signed)
Chronic, stable. Continue current regimen. 

## 2015-03-23 ENCOUNTER — Encounter: Payer: Self-pay | Admitting: *Deleted

## 2015-03-23 ENCOUNTER — Other Ambulatory Visit: Payer: Self-pay | Admitting: Family Medicine

## 2015-03-23 MED ORDER — FERROUS SULFATE 325 (65 FE) MG PO TABS: 325.0000 mg | ORAL_TABLET | Freq: Every day | ORAL | Status: DC

## 2015-03-25 ENCOUNTER — Other Ambulatory Visit: Payer: Self-pay | Admitting: Family Medicine

## 2015-03-25 ENCOUNTER — Ambulatory Visit: Payer: Medicare PPO | Attending: Family Medicine

## 2015-03-25 DIAGNOSIS — Z1231 Encounter for screening mammogram for malignant neoplasm of breast: Secondary | ICD-10-CM | POA: Diagnosis not present

## 2015-03-25 LAB — HM MAMMOGRAPHY: HM Mammogram: NORMAL

## 2015-03-25 MED ORDER — ATORVASTATIN CALCIUM 40 MG PO TABS: 40.0000 mg | ORAL_TABLET | ORAL | Status: DC

## 2015-03-25 MED ORDER — ESOMEPRAZOLE MAGNESIUM 40 MG PO CPDR: 40.0000 mg | DELAYED_RELEASE_CAPSULE | Freq: Every day | ORAL | Status: DC

## 2015-03-25 MED ORDER — OXYBUTYNIN CHLORIDE ER 10 MG PO TB24: ORAL_TABLET | ORAL | Status: DC

## 2015-03-25 MED ORDER — CLONIDINE HCL 0.1 MG PO TABS: 0.1000 mg | ORAL_TABLET | Freq: Two times a day (BID) | ORAL | Status: DC | PRN

## 2015-03-25 MED ORDER — GABAPENTIN 300 MG PO CAPS: ORAL_CAPSULE | ORAL | Status: DC

## 2015-03-25 MED ORDER — SUCRALFATE 1 G PO TABS: 1.0000 g | ORAL_TABLET | Freq: Two times a day (BID) | ORAL | Status: DC

## 2015-03-25 MED ORDER — AMLODIPINE BESYLATE 10 MG PO TABS: 10.0000 mg | ORAL_TABLET | Freq: Every day | ORAL | Status: DC

## 2015-03-25 NOTE — Telephone Encounter (Signed)
Ok to refill 

## 2015-03-26 ENCOUNTER — Encounter: Payer: Self-pay | Admitting: *Deleted

## 2015-03-26 ENCOUNTER — Encounter: Payer: Self-pay | Admitting: Family Medicine

## 2015-03-30 ENCOUNTER — Ambulatory Visit: Payer: Medicare PPO

## 2015-05-11 ENCOUNTER — Other Ambulatory Visit: Payer: Self-pay | Admitting: Family Medicine

## 2015-05-11 NOTE — Telephone Encounter (Signed)
Ok to refill 

## 2015-07-13 DIAGNOSIS — K8689 Other specified diseases of pancreas: Secondary | ICD-10-CM | POA: Insufficient documentation

## 2015-07-13 DIAGNOSIS — I700 Atherosclerosis of aorta: Secondary | ICD-10-CM | POA: Insufficient documentation

## 2015-07-13 DIAGNOSIS — I2510 Atherosclerotic heart disease of native coronary artery without angina pectoris: Secondary | ICD-10-CM

## 2015-07-13 DIAGNOSIS — N281 Cyst of kidney, acquired: Secondary | ICD-10-CM

## 2015-07-13 HISTORY — DX: Atherosclerotic heart disease of native coronary artery without angina pectoris: I25.10

## 2015-07-13 HISTORY — DX: Cyst of kidney, acquired: N28.1

## 2015-07-13 HISTORY — DX: Atherosclerosis of aorta: I70.0

## 2015-07-13 HISTORY — DX: Other specified diseases of pancreas: K86.89

## 2015-07-26 ENCOUNTER — Ambulatory Visit (INDEPENDENT_AMBULATORY_CARE_PROVIDER_SITE_OTHER): Payer: Medicare PPO | Admitting: Family Medicine

## 2015-07-26 ENCOUNTER — Encounter: Payer: Self-pay | Admitting: Family Medicine

## 2015-07-26 VITALS — BP 154/94 | HR 64 | Temp 98.0°F | Wt 191.0 lb

## 2015-07-26 DIAGNOSIS — R1084 Generalized abdominal pain: Secondary | ICD-10-CM

## 2015-07-26 DIAGNOSIS — R197 Diarrhea, unspecified: Secondary | ICD-10-CM

## 2015-07-26 DIAGNOSIS — Z23 Encounter for immunization: Secondary | ICD-10-CM | POA: Diagnosis not present

## 2015-07-26 DIAGNOSIS — R112 Nausea with vomiting, unspecified: Secondary | ICD-10-CM | POA: Diagnosis not present

## 2015-07-26 LAB — CBC WITH DIFFERENTIAL/PLATELET
BASOS ABS: 0 10*3/uL (ref 0.0–0.1)
Basophils Relative: 0.3 % (ref 0.0–3.0)
Eosinophils Absolute: 0.1 10*3/uL (ref 0.0–0.7)
Eosinophils Relative: 1.8 % (ref 0.0–5.0)
HEMATOCRIT: 35.9 % — AB (ref 36.0–46.0)
HEMOGLOBIN: 11.9 g/dL — AB (ref 12.0–15.0)
LYMPHS PCT: 24.8 % (ref 12.0–46.0)
Lymphs Abs: 1.9 10*3/uL (ref 0.7–4.0)
MCHC: 33 g/dL (ref 30.0–36.0)
MCV: 90.1 fl (ref 78.0–100.0)
MONOS PCT: 10.4 % (ref 3.0–12.0)
Monocytes Absolute: 0.8 10*3/uL (ref 0.1–1.0)
NEUTROS ABS: 4.7 10*3/uL (ref 1.4–7.7)
Neutrophils Relative %: 62.7 % (ref 43.0–77.0)
Platelets: 229 10*3/uL (ref 150.0–400.0)
RBC: 3.98 Mil/uL (ref 3.87–5.11)
RDW: 14.2 % (ref 11.5–15.5)
WBC: 7.6 10*3/uL (ref 4.0–10.5)

## 2015-07-26 LAB — COMPREHENSIVE METABOLIC PANEL
ALK PHOS: 73 U/L (ref 39–117)
ALT: 8 U/L (ref 0–35)
AST: 13 U/L (ref 0–37)
Albumin: 4.1 g/dL (ref 3.5–5.2)
BILIRUBIN TOTAL: 0.4 mg/dL (ref 0.2–1.2)
BUN: 7 mg/dL (ref 6–23)
CALCIUM: 9.4 mg/dL (ref 8.4–10.5)
CO2: 29 meq/L (ref 19–32)
Chloride: 105 mEq/L (ref 96–112)
Creatinine, Ser: 0.85 mg/dL (ref 0.40–1.20)
GFR: 83.05 mL/min (ref >60.00)
Glucose, Bld: 104 mg/dL — ABNORMAL HIGH (ref 70–99)
Potassium: 3.8 mEq/L (ref 3.5–5.1)
Sodium: 142 mEq/L (ref 135–145)
Total Protein: 7.2 g/dL (ref 6.0–8.3)

## 2015-07-26 LAB — POCT URINALYSIS DIPSTICK
BILIRUBIN UA: NEGATIVE
GLUCOSE UA: NEGATIVE
KETONES UA: NEGATIVE
Leukocytes, UA: NEGATIVE
NITRITE UA: NEGATIVE
PH UA: 6
Protein, UA: NEGATIVE
RBC UA: NEGATIVE
Spec Grav, UA: 1.025
Urobilinogen, UA: 1

## 2015-07-26 LAB — LIPASE: Lipase: 11 U/L (ref 11.0–59.0)

## 2015-07-26 MED ORDER — ONDANSETRON 4 MG PO TBDP: 4.0000 mg | ORAL_TABLET | Freq: Two times a day (BID) | ORAL | Status: DC | PRN

## 2015-07-26 MED ORDER — ONDANSETRON HCL 4 MG PO TABS: ORAL_TABLET | ORAL | Status: DC

## 2015-07-26 MED ORDER — NYSTATIN 100000 UNIT/GM EX POWD: CUTANEOUS | Status: DC

## 2015-07-26 MED ORDER — ONDANSETRON 4 MG PO TBDP
4.0000 mg | ORAL_TABLET | Freq: Once | ORAL | Status: AC
  Administered 2015-07-26: 4 mg via ORAL

## 2015-07-26 MED ORDER — DICLOFENAC SODIUM 1 % TD GEL: TRANSDERMAL | Status: DC

## 2015-07-26 NOTE — Addendum Note (Signed)
Addended by: Ria Bush on: 07/26/2015 10:51 AM   Modules accepted: Orders

## 2015-07-26 NOTE — Assessment & Plan Note (Addendum)
Generalized abd pain - predominantly RUQ and lower abdomen - with nausea/vomiting/diarrhea ongoing for 3 weeks since what sounds like food poisoning.  Check UA, CBC, CMP, lipase today. Check CT abd/pelvis with contrast to further evaluate persistent abdominal pain ==> IV dye contrast allergy so have changed order to CT abd/pelvix without contrast. Increase nexium to 40mg  BID, continue other treatment including zofran, carafate and zantac.  Hold iron and other vitamins until feeling better. Discussed red flags to seek urgent care.

## 2015-07-26 NOTE — Addendum Note (Signed)
Addended by: Royann Shivers A on: 07/26/2015 10:45 AM   Modules accepted: Orders, Medications

## 2015-07-26 NOTE — Progress Notes (Signed)
Pre visit review using our clinic review tool, if applicable. No additional management support is needed unless otherwise documented below in the visit note. 

## 2015-07-26 NOTE — Patient Instructions (Addendum)
Flu shot today. zofran ODT 4mg  today then refilled to pharmacy. labwork today.  Urinalysis today. Pass by referral coordinator for abd/pelvic CT scan. We will call you with results and plan. If you start feeling worse - worsening abdominal pain or fever >101, please seek care at ER.

## 2015-07-26 NOTE — Progress Notes (Addendum)
BP 154/94 mmHg  Pulse 64  Temp(Src) 98 F (36.7 C) (Oral)  Wt 191 lb (86.637 kg)   CC: nausea/vomiting, diarrhea  Subjective:    Patient ID: Brianna Woods, female    DOB: 05-06-1935, 80 y.o.   MRN: [id number]  HPI: [name] is a 80 y.o. female presenting on 07/26/2015 for Emesis and Medication Refill   Not feeling well today. 3wk h/o nausea/vomiting, diarrhea, epigastric soreness and loss of appetite. No weight loss. Started after great grand daughter's christening - ate at a restaurant and got sick 45 min later with persistent emesis. No one else got sick. Has been treating with zofran, feels she is staying well hydrated. She has continued with vomiting and diarrhea since then. Watery diarrhea 4-5 times daily, has been treating with immodium which helps. BRAT diet not helping. Jello didn't help. Nothing staying down now.    No fevers/chills.   H/o PUD per patient - on nexium 40mg  daily, zantac 300mg  qhs, and carafate 1gm BID. Taking ferrous sulfate daily.   No recent abx use.   Relevant past medical, surgical, family and social history reviewed and updated as indicated. Interim medical history since our last visit reviewed. Allergies and medications reviewed and updated. Current Outpatient Prescriptions on File Prior to Visit  Medication Sig  . albuterol (VENTOLIN HFA) 108 (90 BASE) MCG/ACT inhaler Inhale 2 puffs into the lungs every 6 (six) hours as needed. for wheezing  . amLODipine (NORVASC) 10 MG tablet Take 1 tablet (10 mg total) by mouth daily.  Marland Kitchen atorvastatin (LIPITOR) 40 MG tablet Take 1 tablet (40 mg total) by mouth every Monday, Wednesday, and Friday.  . cholecalciferol 5000 UNITS TABS Take 5,000 Units by mouth daily.  . cloNIDine (CATAPRES) 0.1 MG tablet Take 1 tablet (0.1 mg total) by mouth 2 (two) times daily as needed.  Marland Kitchen co-enzyme Q-10 30 MG capsule Take 1 capsule (30 mg total) by mouth daily.  Marland Kitchen conjugated estrogens (PREMARIN) vaginal cream APPLY 1/4  APPLICATORFUL VAGINALLY THREE TIMES A WEEK  . esomeprazole (NEXIUM) 40 MG capsule Take 1 capsule (40 mg total) by mouth daily.  . ferrous sulfate 325 (65 FE) MG tablet Take 1 tablet (325 mg total) by mouth daily with breakfast.  . gabapentin (NEURONTIN) 300 MG capsule TAKE 1 CAPSULE THREE TIMES DAILY  . metoprolol tartrate (LOPRESSOR) 25 MG tablet TAKE 1 TABLET TWICE DAILY  . oxybutynin (DITROPAN-XL) 10 MG 24 hr tablet TAKE 1 TABLET EVERY DAY  . ranitidine (ZANTAC) 300 MG tablet Take 1 tablet (300 mg total) by mouth at bedtime.  . sucralfate (CARAFATE) 1 G tablet Take 1 tablet (1 g total) by mouth 2 (two) times daily.  . traMADol-acetaminophen (ULTRACET) 37.5-325 MG per tablet Take 1 tablet by mouth every 8 (eight) hours as needed. for pain  . traZODone (DESYREL) 100 MG tablet TAKE 1/2 TO 1 TABLET AT BEDTIME  . vitamin C (ASCORBIC ACID) 500 MG tablet Take 500 mg by mouth daily.  . [DISCONTINUED] pirbuterol (MAXAIR) 200 MCG/INH inhaler Inhale 2 puffs into the lungs 4 (four) times daily.   No current facility-administered medications on file prior to visit.   Past Medical History  Diagnosis Date  . HLD (hyperlipidemia)   . HTN (hypertension)   . Fibromyalgia     per pt, no records of this   . History of ulcer disease     per pt, no records of this  . History of nephrolithiasis     s/p surgery R kidney (  2mm) 11/2009  . Mixed incontinence     on ditropan  . GERD (gastroesophageal reflux disease)     and esoph stricture s/p dilation 2008  . Barrett's esophagus     on EGD 2008, EGD WNL 2013  . Nodular goiter, toxic or with hyperthyroidism     h/o toxic, on methimazole, known large left thyroid nodule 08/2013 s/p beign biopsy per patient 2010, saw Dr. Gabriel Carina, Rad I ablation 04/2014  . Asthma     per pt  . History of CVA (cerebrovascular accident) 2008    "I've had several TIAs"  . Anemia   . History of chicken pox   . History of cardiac murmur   . Vitamin D deficiency   . Arthritis     . History of right bundle branch block 2011  . History of anxiety     was on prozac then effexor (pt denies h/o anxiety/depression)  . Diverticulosis 2014    sigmoid on colonoscopy     Review of Systems Per HPI unless specifically indicated in ROS section     Objective:    BP 154/94 mmHg  Pulse 64  Temp(Src) 98 F (36.7 C) (Oral)  Wt 191 lb (86.637 kg)  Wt Readings from Last 3 Encounters:  07/26/15 191 lb (86.637 kg)  03/22/15 192 lb 8 oz (87.317 kg)  09/21/14 192 lb 8 oz (87.317 kg)    Physical Exam  Constitutional: She appears well-developed and well-nourished. No distress.  HENT:  Mouth/Throat: Oropharynx is clear and moist. No oropharyngeal exudate.  Cardiovascular: Normal rate, regular rhythm, normal heart sounds and intact distal pulses.   No murmur heard. Pulmonary/Chest: Effort normal and breath sounds normal. No respiratory distress. She has no wheezes. She has no rales.  Abdominal: Soft. Bowel sounds are normal. She exhibits no distension and no mass. There is no hepatosplenomegaly. There is tenderness in the right lower quadrant, epigastric area and left lower quadrant. There is guarding. There is no rigidity, no rebound and negative Murphy's sign.  Musculoskeletal: She exhibits no edema.  Skin: Skin is warm and dry. No rash noted.  Nursing note and vitals reviewed.  Results for orders placed or performed in visit on 03/26/15  HM MAMMOGRAPHY  Result Value Ref Range   HM Mammogram Normal Birads 2-Repeat 1 year       Assessment & Plan:   Problem List Items Addressed This Visit    Generalized abdominal pain - Primary    Generalized abd pain - predominantly RUQ and lower abdomen - with nausea/vomiting/diarrhea ongoing for 3 weeks since what sounds like food poisoning.  Check UA, CBC, CMP, lipase today. Check CT abd/pelvis with contrast to further evaluate persistent abdominal pain ==> IV dye contrast allergy so have changed order to CT abd/pelvix without  contrast. Increase nexium to 40mg  BID, continue other treatment including zofran, carafate and zantac.  Hold iron and other vitamins until feeling better. Discussed red flags to seek urgent care.      Relevant Orders   Comprehensive metabolic panel   CBC with Differential/Platelet   Lipase   CT Abdomen Pelvis Wo Contrast    Other Visit Diagnoses    Nausea vomiting and diarrhea        Relevant Medications    ondansetron (ZOFRAN-ODT) disintegrating tablet 4 mg (Completed)    Other Relevant Orders    Comprehensive metabolic panel    CBC with Differential/Platelet    Lipase    CT Abdomen Pelvis Wo Contrast  Need for influenza vaccination        Relevant Orders    Flu Vaccine QUAD 36+ mos PF IM (Fluarix & Fluzone Quad PF) (Completed)        Follow up plan: Return if symptoms worsen or fail to improve.

## 2015-07-26 NOTE — Addendum Note (Signed)
Addended by: Royann Shivers A on: 07/26/2015 11:00 AM   Modules accepted: Orders

## 2015-07-29 ENCOUNTER — Ambulatory Visit
Admission: RE | Admit: 2015-07-29 | Discharge: 2015-07-29 | Disposition: A | Discharge status: Home / Self Care | Payer: Medicare PPO | Source: Ambulatory Visit | Attending: Family Medicine | Admitting: Family Medicine

## 2015-07-29 ENCOUNTER — Encounter: Payer: Self-pay | Admitting: Family Medicine

## 2015-07-29 DIAGNOSIS — R197 Diarrhea, unspecified: Secondary | ICD-10-CM | POA: Diagnosis not present

## 2015-07-29 DIAGNOSIS — K449 Diaphragmatic hernia without obstruction or gangrene: Secondary | ICD-10-CM | POA: Insufficient documentation

## 2015-07-29 DIAGNOSIS — R112 Nausea with vomiting, unspecified: Secondary | ICD-10-CM

## 2015-07-29 DIAGNOSIS — K5730 Diverticulosis of large intestine without perforation or abscess without bleeding: Secondary | ICD-10-CM | POA: Diagnosis not present

## 2015-07-29 DIAGNOSIS — N200 Calculus of kidney: Secondary | ICD-10-CM | POA: Diagnosis not present

## 2015-07-29 DIAGNOSIS — R1084 Generalized abdominal pain: Secondary | ICD-10-CM | POA: Diagnosis not present

## 2015-07-29 DIAGNOSIS — I2510 Atherosclerotic heart disease of native coronary artery without angina pectoris: Secondary | ICD-10-CM | POA: Insufficient documentation

## 2015-07-29 IMAGING — CT CT ABD-PELV W/O CM
2 of 4 series · 15 of 46 positions shown, 17 images · non-contrast
Comparison: [DATE] CT abdomen/pelvis.

CLINICAL DATA: Generalized abdominal pain, nausea, vomiting and
diarrhea for 3 weeks. Prior cholecystectomy.

EXAM:
CT ABDOMEN AND PELVIS WITHOUT CONTRAST
TECHNIQUE: Multidetector CT imaging of the abdomen and pelvis was performed
following the standard protocol without IV contrast.

[Series 2: routine without · axial · non-contrast · 0.73mm/px · z∈[-868,-493]mm · 12 of 86 slices shown, 14 images]
[im 7/86  soft-tissue]
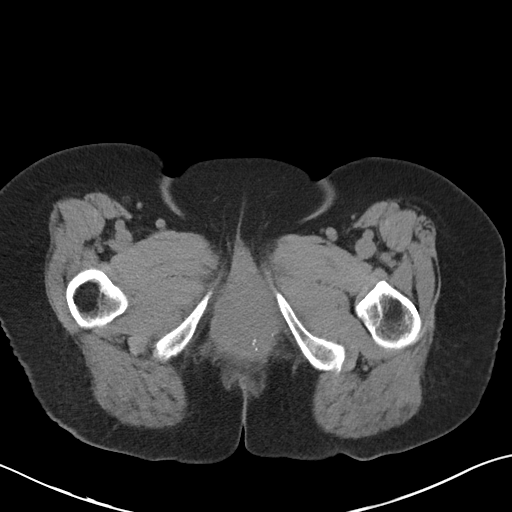
[im 7/86  bone]
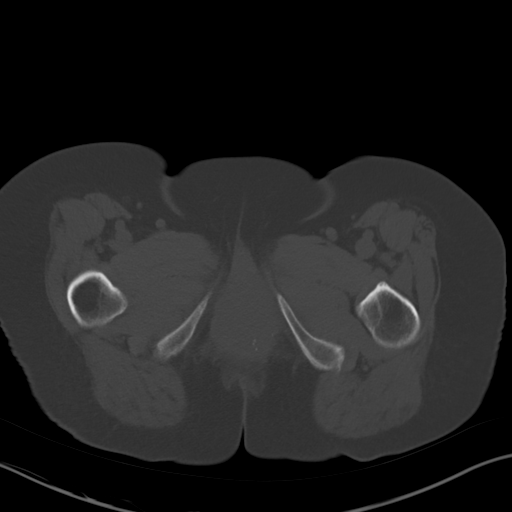
[im 14/86  soft-tissue]
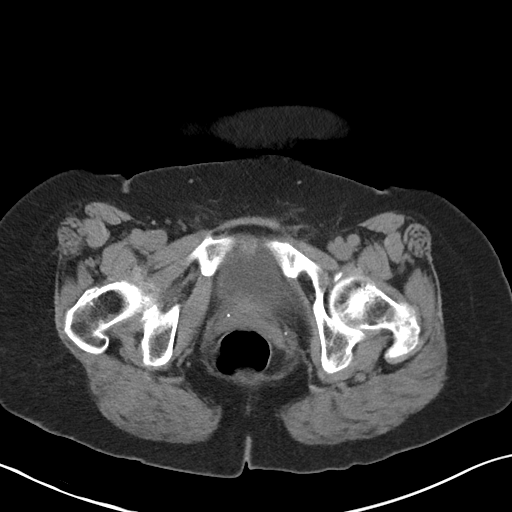
[im 21/86  soft-tissue]
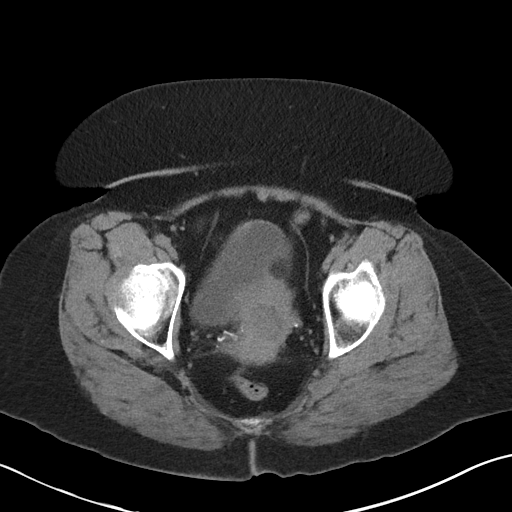
[im 28/86  soft-tissue]
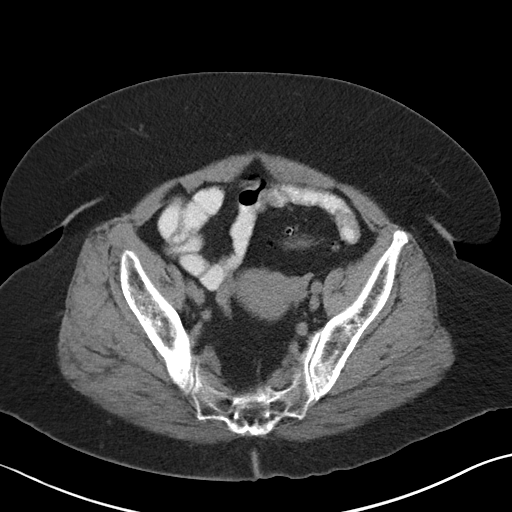
[im 35/86  soft-tissue]
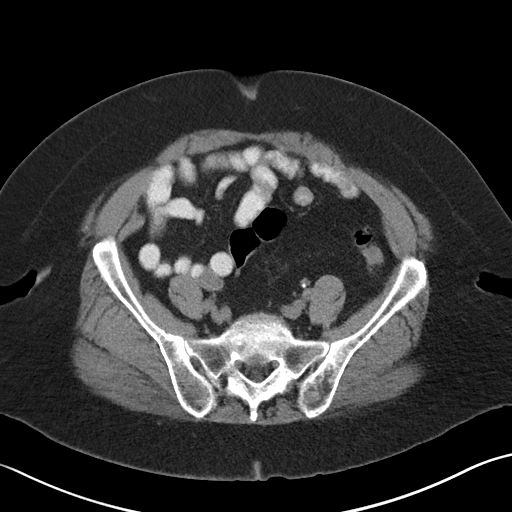
[im 41/86  soft-tissue]
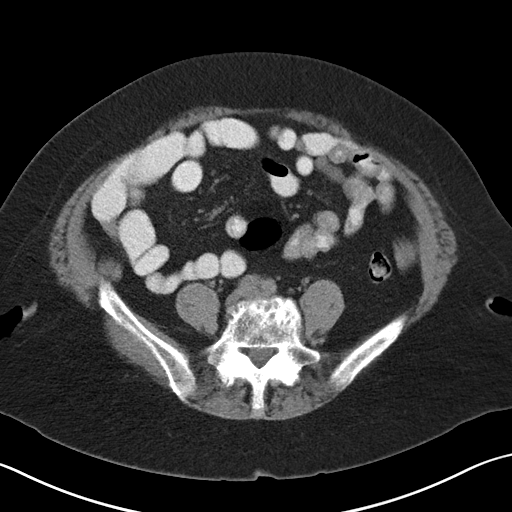
[im 48/86  soft-tissue]
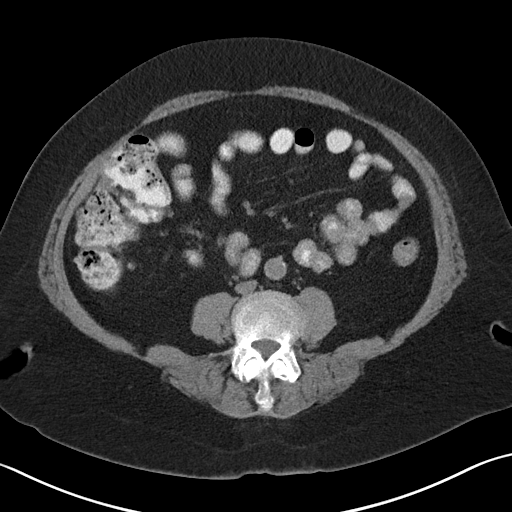
[im 55/86  soft-tissue]
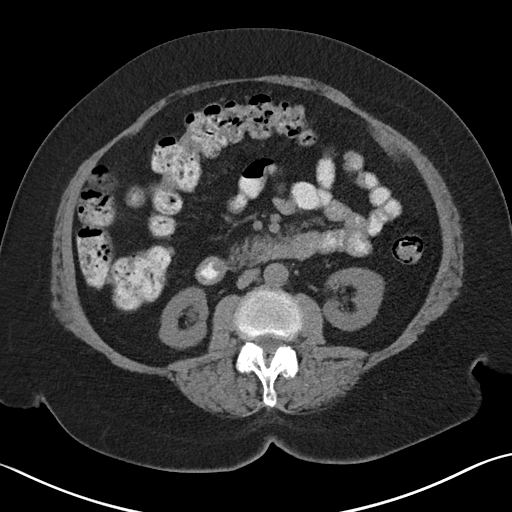
[im 62/86  soft-tissue]
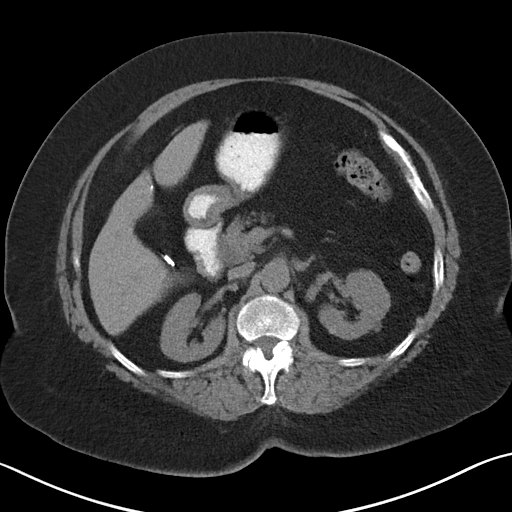
[im 62/86  bone]
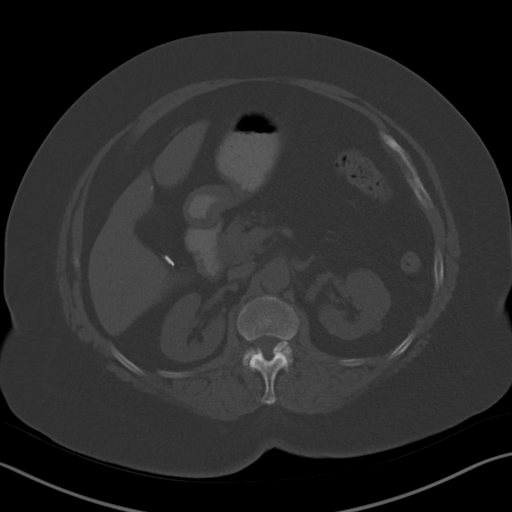
[im 69/86  soft-tissue]
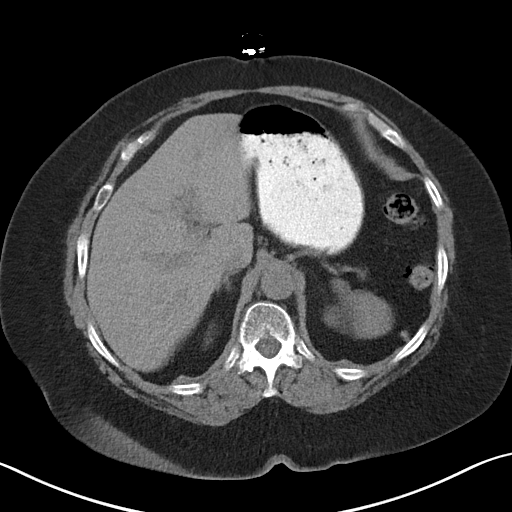
[im 75/86  soft-tissue]
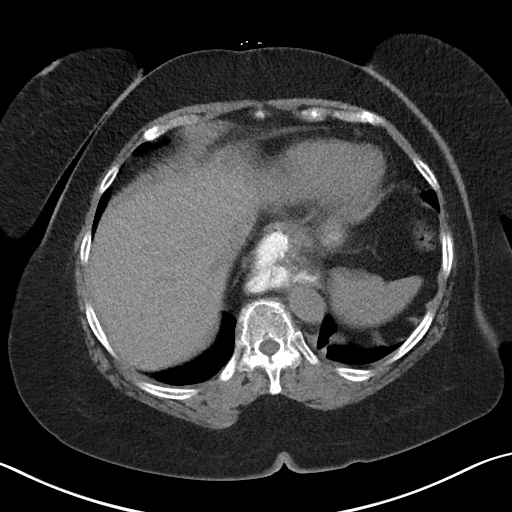
[im 82/86  soft-tissue]
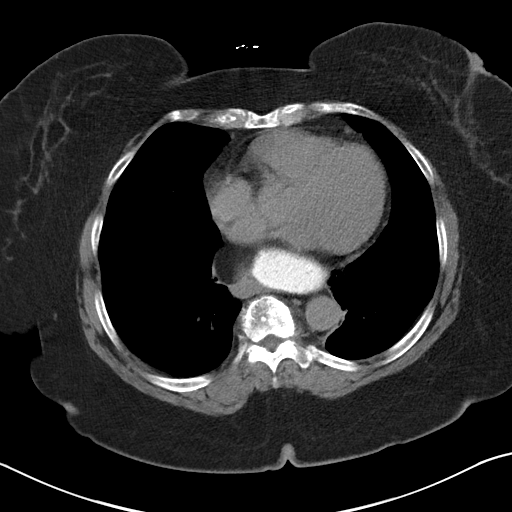

[Series 5: cor routine without · coronal · non-contrast · 0.73mm/px · 3 of 169 slices shown]
[im 57/169  soft-tissue]
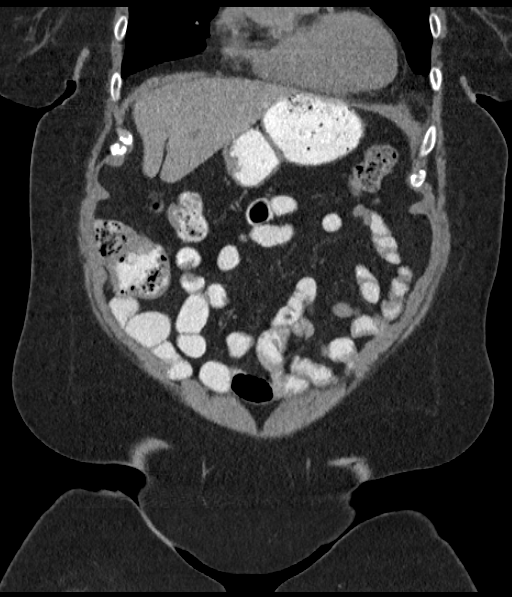
[im 75/169  soft-tissue]
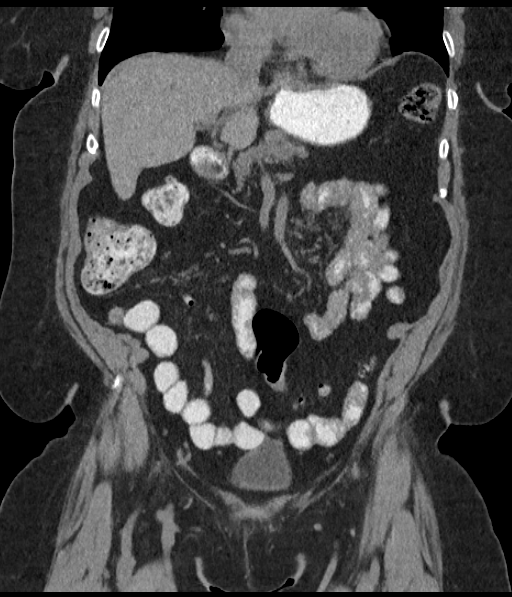
[im 94/169  soft-tissue]
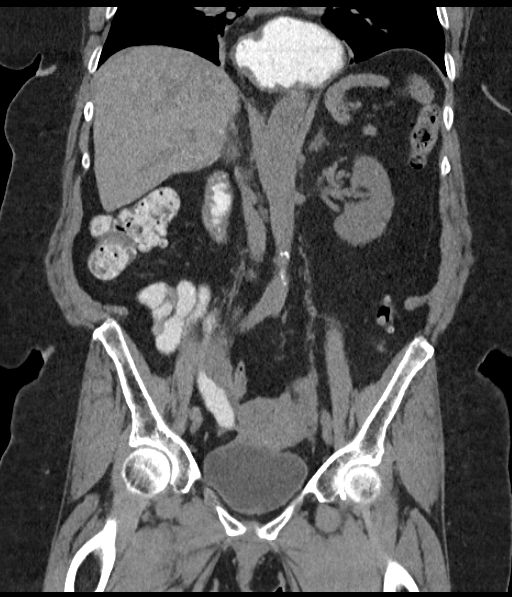

[15 of 46 positions shown; findings below may reference images not displayed]

FINDINGS: Lower chest: No significant pulmonary nodules or acute consolidative
airspace disease. Left anterior descending coronary atherosclerosis.

Hepatobiliary: Normal liver with no liver mass. Status post
cholecystectomy. No intrahepatic biliary ductal dilatation. Dilated
common bile duct (10 mm), stable and within expected post
cholecystectomy limits. No radiopaque choledocholithiasis.

Pancreas: Mild diffuse fatty infiltration of the otherwise normal
pancreas.

Spleen: Normal size. No mass.

Adrenals/Urinary Tract: Normal adrenals. Nonobstructing 9 x 5 mm
stone in the upper right kidney. No hydronephrosis. Hyperdense
cm lesion in the posterior interpolar left kidney. Exophytic 0.5 cm
hypodense lesion in the posterior interpolar left kidney. No
additional contour deforming renal lesions. Normal caliber ureters,
with no ureteral stones. Normal bladder.

Stomach/Bowel: Moderate hiatal hernia. Otherwise grossly normal
stomach. Normal caliber small bowel with no small bowel wall
thickening. Normal appendix. Mild diverticulosis throughout the
colon, most prominent in the sigmoid colon, with no large bowel wall
thickening or pericolonic fat stranding.

Vascular/Lymphatic: Atherosclerotic nonaneurysmal abdominal aorta.
No pathologically enlarged lymph nodes in the abdomen or pelvis.

Reproductive: Nonspecific hypodense 2.8 cm mass in the lower uterine
segment, increased from 2.2 cm on [DATE], in keeping with a
growing fibroid, likely a lipoleiomyoma as seen on [DATE] pelvic
sonogram. No adnexal mass.

Other: No pneumoperitoneum, ascites or focal fluid collection.

Musculoskeletal: No aggressive appearing focal osseous lesions.
Moderate degenerative changes in the visualized thoracolumbar spine.
IMPRESSION: 1. Mild diffuse colonic diverticulosis, most prominent in the
sigmoid colon, with no evidence of acute diverticulitis. No evidence
of bowel obstruction or acute bowel inflammation. Normal appendix.
2. Nonobstructing right renal stone.  No hydronephrosis.
3. Two subcentimeter left renal lesions, too small to characterize,
one of which is hyperdense and indeterminate. Recommend a follow-up
MRI abdomen with and without intravenous contrast in 6 months to
confirm stability and to attempt further characterization.
4. Moderate hiatal hernia.
5. Enlarging 2.8 cm lipoleiomyoma in the lower uterine segment.
6. Left anterior descending coronary atherosclerosis.

## 2015-09-15 ENCOUNTER — Telehealth: Payer: Self-pay | Admitting: Family Medicine

## 2015-09-15 NOTE — Telephone Encounter (Signed)
[facility] Patient Name: ANUSHKA LABARGE DOB: 1935-04-24 Initial Comment Legs and feet are swollen Nurse Assessment Nurse: Doyle Askew, RN, Joelene Millin Date/Time (Eastern Time): 09/15/2015 4:01:49 PM Confirm and document reason for call. If symptomatic, describe symptoms. ---caller states her legs and feet are swollen. no heart issues (no CHF). started on monday night. Does have htn but bp has been normal. no dyspnea. no pitting edema. edema is only feet and ankles. Has the patient traveled out of the country within the last 30 days? ---Not Applicable Does the patient have any new or worsening symptoms? ---Yes Will a triage be completed? ---Yes Related visit to physician within the last 2 weeks? ---No Does the PT have any chronic conditions? (i.e. diabetes, asthma, etc.) ---Yes List chronic conditions. ---htn, asthma, fibromyalgia (using walker), Is this a behavioral health or substance abuse call? ---No Guidelines Guideline Title Affirmed Question Affirmed Notes Leg Swelling and Edema [1] MODERATE leg swelling (e.g., swelling extends up to knees) AND [2] new onset or worsening Final Disposition User See Physician within 24 Hours Doyle Askew, RN, Joelene Millin Comments nurse wasn't able to leave message as both times nurse called, pt hung up before nurse could explain who she was Appt scheduled for 09/16/15 at 1pm with Dr. Alma Friendly. Referrals REFERRED TO PCP OFFICE Disagree/Comply: Comply

## 2015-09-15 NOTE — Telephone Encounter (Signed)
Appointment to evaluate symptoms scheduled for 09/16/15 at 1300 with Allie Bossier, NP.

## 2015-09-16 ENCOUNTER — Ambulatory Visit (INDEPENDENT_AMBULATORY_CARE_PROVIDER_SITE_OTHER): Payer: Medicare PPO | Admitting: Primary Care

## 2015-09-16 ENCOUNTER — Encounter: Payer: Self-pay | Admitting: Primary Care

## 2015-09-16 VITALS — BP 142/82 | HR 81 | Temp 97.9°F | Wt 195.0 lb

## 2015-09-16 DIAGNOSIS — R600 Localized edema: Secondary | ICD-10-CM

## 2015-09-16 NOTE — Progress Notes (Signed)
Pre visit review using our clinic review tool, if applicable. No additional management support is needed unless otherwise documented below in the visit note. 

## 2015-09-16 NOTE — Patient Instructions (Addendum)
Work to decrease your intake of salt consumption to help reduce your swelling.  Ensure that you are elevating your feet when at home resting.  Purchase compression stockings to help reduce swelling.   Follow up with Dr. Danise Mina next week as scheduled.  It was a pleasure meeting you!  Low-Sodium Eating Plan Sodium raises blood pressure and causes water to be held in the body. Getting less sodium from food will help lower your blood pressure, reduce any swelling, and protect your heart, liver, and kidneys. We get sodium by adding salt (sodium chloride) to food. Most of our sodium comes from canned, boxed, and frozen foods. Restaurant foods, fast foods, and pizza are also very high in sodium. Even if you take medicine to lower your blood pressure or to reduce fluid in your body, getting less sodium from your food is important. WHAT IS MY PLAN? Most people should limit their sodium intake to 2,300 mg a day. Your health care provider recommends that you limit your sodium intake to __________ a day.  WHAT DO I NEED TO KNOW ABOUT THIS EATING PLAN? For the low-sodium eating plan, you will follow these general guidelines:  Choose foods with a % Daily Value for sodium of less than 5% (as listed on the food label).   Use salt-free seasonings or herbs instead of table salt or sea salt.   Check with your health care provider or pharmacist before using salt substitutes.   Eat fresh foods.  Eat more vegetables and fruits.  Limit canned vegetables. If you do use them, rinse them well to decrease the sodium.   Limit cheese to 1 oz (28 g) per day.   Eat lower-sodium products, often labeled as "lower sodium" or "no salt added."  Avoid foods that contain monosodium glutamate (MSG). MSG is sometimes added to [place] food and some canned foods.  Check food labels (Nutrition Facts labels) on foods to learn how much sodium is in one serving.  Eat more home-cooked food and less restaurant,  buffet, and fast food.  When eating at a restaurant, ask that your food be prepared with less salt, or no salt if possible.  HOW DO I READ FOOD LABELS FOR SODIUM INFORMATION? The Nutrition Facts label lists the amount of sodium in one serving of the food. If you eat more than one serving, you must multiply the listed amount of sodium by the number of servings. Food labels may also identify foods as:  Sodium free--Less than 5 mg in a serving.  Very low sodium--35 mg or less in a serving.  Low sodium--140 mg or less in a serving.  Light in sodium--50% less sodium in a serving. For example, if a food that usually has 300 mg of sodium is changed to become light in sodium, it will have 150 mg of sodium.  Reduced sodium--25% less sodium in a serving. For example, if a food that usually has 400 mg of sodium is changed to reduced sodium, it will have 300 mg of sodium. WHAT FOODS CAN I EAT? Grains Low-sodium cereals, including oats, puffed wheat and rice, and shredded wheat cereals. Low-sodium crackers. Unsalted rice and pasta. Lower-sodium bread.  Vegetables Frozen or fresh vegetables. Low-sodium or reduced-sodium canned vegetables. Low-sodium or reduced-sodium tomato sauce and paste. Low-sodium or reduced-sodium tomato and vegetable juices.  Fruits Fresh, frozen, and canned fruit. Fruit juice.  Meat and Other Protein Products Low-sodium canned tuna and salmon. Fresh or frozen meat, poultry, seafood, and fish. Lamb. Unsalted nuts. Dried  beans, peas, and lentils without added salt. Unsalted canned beans. Homemade soups without salt. Eggs.  Dairy Milk. Soy milk. Ricotta cheese. Low-sodium or reduced-sodium cheeses. Yogurt.  Condiments Fresh and dried herbs and spices. Salt-free seasonings. Onion and garlic powders. Low-sodium varieties of mustard and ketchup. Fresh or refrigerated horseradish. Lemon juice.  Fats and Oils Reduced-sodium salad dressings. Unsalted butter.   Other Unsalted popcorn and pretzels.  The items listed above may not be a complete list of recommended foods or beverages. Contact your dietitian for more options. WHAT FOODS ARE NOT RECOMMENDED? Grains Instant hot cereals. Bread stuffing, pancake, and biscuit mixes. Croutons. Seasoned rice or pasta mixes. Noodle soup cups. Boxed or frozen macaroni and cheese. Self-rising flour. Regular salted crackers. Vegetables Regular canned vegetables. Regular canned tomato sauce and paste. Regular tomato and vegetable juices. Frozen vegetables in sauces. Salted [place] fries. Olives. Angie Fava. Relishes. Sauerkraut. Salsa. Meat and Other Protein Products Salted, canned, smoked, spiced, or pickled meats, seafood, or fish. Bacon, ham, sausage, hot dogs, corned beef, chipped beef, and packaged luncheon meats. Salt pork. Jerky. Pickled herring. Anchovies, regular canned tuna, and sardines. Salted nuts. Dairy Processed cheese and cheese spreads. Cheese curds. Blue cheese and cottage cheese. Buttermilk.  Condiments Onion and garlic salt, seasoned salt, table salt, and sea salt. Canned and packaged gravies. Worcestershire sauce. Tartar sauce. Barbecue sauce. Teriyaki sauce. Soy sauce, including reduced sodium. Steak sauce. Fish sauce. Oyster sauce. Cocktail sauce. Horseradish that you find on the shelf. Regular ketchup and mustard. Meat flavorings and tenderizers. Bouillon cubes. Hot sauce. Tabasco sauce. Marinades. Taco seasonings. Relishes. Fats and Oils Regular salad dressings. Salted butter. Margarine. Ghee. Bacon fat.  Other Potato and tortilla chips. Corn chips and puffs. Salted popcorn and pretzels. Canned or dried soups. Pizza. Frozen entrees and pot pies.  The items listed above may not be a complete list of foods and beverages to avoid. Contact your dietitian for more information.   This information is not intended to replace advice given to you by your health care provider. Make sure you  discuss any questions you have with your health care provider.   Document Released: 02/17/2002 Document Revised: 09/18/2014 Document Reviewed: 07/02/2013 Elsevier Interactive Patient Education [employer].

## 2015-09-16 NOTE — Progress Notes (Signed)
Subjective:    Patient ID: Brianna Woods, female    DOB: [birth year], 81 y.o.   MRN: [id number]  HPI  Brianna Woods is a 81 year old female who presents today with a chief complaint of lower extremity edema including ankle and foot swelling. Her feet have been swelling since Sunday this week. Her legs are also tender with tightness. She denies being on her feet for prolonged amounts of time, shortness of breath, chest pain, dizziness, changes in urination, or an increase in salt intake during the holidays. She is mildly active at home, does spend time sitting and doesn't typically elevate lower extremities. She's managed on amlodipine since 2013.   Review of Systems  Constitutional: Negative for fever.  Respiratory: Negative for cough and shortness of breath.   Cardiovascular:       Ankle and foot edema bilaterally.  Genitourinary: Negative for difficulty urinating.  Neurological: Negative for dizziness.       Past Medical History  Diagnosis Date  . HLD (hyperlipidemia)   . HTN (hypertension)   . Fibromyalgia     per pt, no records of this   . History of ulcer disease     per pt, no records of this  . History of nephrolithiasis     s/p surgery R kidney (84mm) 11/2009  . Mixed incontinence     on ditropan  . GERD (gastroesophageal reflux disease)     and esoph stricture s/p dilation 2008 HH by CT 2016  . Barrett's esophagus     on EGD 2008, EGD WNL 2013  . Nodular goiter, toxic or with hyperthyroidism     h/o toxic, on methimazole, known large left thyroid nodule 08/2013 s/p beign biopsy per patient 2010, saw Dr. Gabriel Carina, Rad I ablation 04/2014  . Asthma     per pt  . History of CVA (cerebrovascular accident) 2008    "I've had several TIAs"  . Anemia   . History of chicken pox   . History of cardiac murmur   . Vitamin D deficiency   . Arthritis   . History of right bundle branch block 2011  . History of anxiety     was on prozac then effexor (pt denies h/o anxiety/depression)  .  Diverticulosis 2014    sigmoid on colonoscopy  . Kidney cyst, acquired 07/2015    by CT, rec rpt renal MRI in 6 months  . CAD (coronary artery disease) 07/2015    of LAD by CT  . Atherosclerosis of abdominal aorta ([place]) 07/2015    by CT  . Fatty pancreas ([place]) 07/2015    by CT    Social History   Social History  . Marital Status: Widowed    Spouse Name: N/A  . Number of Children: N/A  . Years of Education: N/A   Occupational History  . Not on file.   Social History Main Topics  . Smoking status: Never Smoker   . Smokeless tobacco: Never Used  . Alcohol Use: No  . Drug Use: No  . Sexual Activity: Not on file   Other Topics Concern  . 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 Cholecystectomy  2006  . Cataract extraction  1990    w/ implants  . Lithotripsy Right 2011  . Esophagogastroduodenoscopy  11/2011    LA grade A esophagitis lower 1/3, dilated, med HH, o/w WNL - path: + GERD, no barrett's - f/u 11/2016  . Dexa      no records received  . Colonoscopy  10/02/12    diverticulosis, o/w WNL ([place])  . Thyroid uptake scan  04/2014    unifrm uptake, enlarged thyroid consistent with grave's dz    Family History  Problem Relation Age of Onset  . Hyperlipidemia Mother   . Hypertension Mother   . Stroke Father     hemorrhage  . Other Brother     TB  . Coronary artery disease Brother   . Aneurysm Son 38    sudden death  . Diabetes Maternal Aunt   . Cancer Brother 57    lung  . Aneurysm Father 73    deceased    Allergies  Allergen Reactions  . Ivp Dye [Iodinated Diagnostic Agents]     Turns red; BP and HR go up  . Penicillins     "Sends  me in left field"  . Statins Other (See Comments)    Body aches (lipitor, simvastatin, pravastatin)    Current Outpatient Prescriptions on File Prior to Visit  Medication Sig Dispense Refill  . albuterol (VENTOLIN HFA) 108 (90 BASE) MCG/ACT inhaler Inhale 2 puffs into the lungs every 6 (six) hours as needed. for wheezing 54 g 3  . amLODipine (NORVASC) 10 MG tablet Take 1 tablet (10 mg total) by mouth daily. 90 tablet 3  . atorvastatin (LIPITOR) 40 MG tablet Take 1 tablet (40 mg total) by mouth every Monday, Wednesday, and Friday. 60 tablet 3  . cholecalciferol 5000 UNITS TABS Take 5,000 Units by mouth daily. 90 tablet 3  . cloNIDine (CATAPRES) 0.1 MG tablet Take 1 tablet (0.1 mg total) by mouth 2 (two) times daily as needed. 180 tablet 3  . co-enzyme Q-10 30 MG capsule Take 1 capsule (30 mg total) by mouth daily.    Marland Kitchen conjugated estrogens (PREMARIN) vaginal cream APPLY 1/4 APPLICATORFUL VAGINALLY THREE TIMES A WEEK 30 g 11  . diclofenac sodium (VOLTAREN) 1 % GEL APPLY 4 GRAMS TOPICALLY FOUR TIMES DAILY AS NEEDED. 100 g 1  . esomeprazole (NEXIUM) 40 MG capsule Take 1 capsule (40 mg total) by mouth daily. 90 capsule 3  . ferrous sulfate 325 (65 FE) MG tablet Take 1 tablet (325 mg total) by mouth daily with breakfast.    . gabapentin (NEURONTIN) 300 MG capsule TAKE 1 CAPSULE THREE TIMES DAILY 270 capsule 3  . metoprolol tartrate (LOPRESSOR) 25 MG tablet TAKE 1 TABLET TWICE DAILY 180 tablet 3  . nystatin (MYCOSTATIN/NYSTOP) 100000 UNIT/GM POWD AAA BID 30 g 0  . ondansetron (ZOFRAN-ODT) 4 MG disintegrating tablet Take 1 tablet (4 mg total) by mouth 2 (two) times daily as needed for nausea or vomiting. 20 tablet 1  . oxybutynin (DITROPAN-XL) 10 MG 24 hr tablet TAKE 1 TABLET EVERY DAY 90 tablet 3  . ranitidine (ZANTAC) 300 MG tablet Take 1 tablet (300 mg total) by mouth at bedtime. 90 tablet 3  . sucralfate (CARAFATE) 1 G tablet Take 1 tablet (1 g total) by mouth 2 (two) times daily. 360 tablet 3  .  traMADol-acetaminophen (ULTRACET) 37.5-325 MG per tablet Take 1 tablet by mouth every 8 (eight) hours as needed.  for pain 60 tablet 3  . traZODone (DESYREL) 100 MG tablet TAKE 1/2 TO 1 TABLET AT BEDTIME 90 tablet 3  . vitamin C (ASCORBIC ACID) 500 MG tablet Take 500 mg by mouth daily.    . [DISCONTINUED] pirbuterol (MAXAIR) 200 MCG/INH inhaler Inhale 2 puffs into the lungs 4 (four) times daily. 1 Inhaler 11   No current facility-administered medications on file prior to visit.    BP 142/82 mmHg  Pulse 81  Temp(Src) 97.9 F (36.6 C) (Oral)  Wt 195 lb (88.451 kg)  SpO2 97%    Objective:   Physical Exam  Constitutional: She appears well-nourished.  Cardiovascular: Normal rate and regular rhythm.   Bilateral ankle and foot edema.  DP and PT pulses 2+. No pitting. Lower extremities non tender. No unilateral swelling.  Pulmonary/Chest: Effort normal and breath sounds normal.  Skin: Skin is warm and dry.          Assessment & Plan:  Lower extremity edema:  To ankles and feet bilaterally since Sunday. Suspect dependent edema due to sedentary lifestyle and possible intake in sodium during holidays. No suspicion for DVT or CHF.  Will have her elevate extremities, reduce salt intake, obtain compression stockings (rx provided). She is to follow up with PCP next week as scheduled. Return precautions provided.

## 2015-09-22 ENCOUNTER — Telehealth: Payer: Self-pay | Admitting: Family Medicine

## 2015-09-22 ENCOUNTER — Ambulatory Visit (INDEPENDENT_AMBULATORY_CARE_PROVIDER_SITE_OTHER): Payer: Medicare PPO | Admitting: Family Medicine

## 2015-09-22 ENCOUNTER — Encounter: Payer: Self-pay | Admitting: Family Medicine

## 2015-09-22 VITALS — BP 146/98 | HR 94 | Temp 98.6°F | Wt 190.0 lb

## 2015-09-22 DIAGNOSIS — E785 Hyperlipidemia, unspecified: Secondary | ICD-10-CM

## 2015-09-22 DIAGNOSIS — I700 Atherosclerosis of aorta: Secondary | ICD-10-CM

## 2015-09-22 DIAGNOSIS — I10 Essential (primary) hypertension: Secondary | ICD-10-CM | POA: Diagnosis not present

## 2015-09-22 DIAGNOSIS — E0520 Thyrotoxicosis with toxic multinodular goiter without thyrotoxic crisis or storm: Secondary | ICD-10-CM

## 2015-09-22 DIAGNOSIS — K8689 Other specified diseases of pancreas: Secondary | ICD-10-CM

## 2015-09-22 DIAGNOSIS — R32 Unspecified urinary incontinence: Secondary | ICD-10-CM

## 2015-09-22 DIAGNOSIS — R600 Localized edema: Secondary | ICD-10-CM

## 2015-09-22 MED ORDER — ATORVASTATIN CALCIUM 40 MG PO TABS: 40.0000 mg | ORAL_TABLET | ORAL | Status: DC

## 2015-09-22 MED ORDER — ESOMEPRAZOLE MAGNESIUM 40 MG PO CPDR: 40.0000 mg | DELAYED_RELEASE_CAPSULE | Freq: Two times a day (BID) | ORAL | Status: DC

## 2015-09-22 MED ORDER — AMLODIPINE BESYLATE 10 MG PO TABS: 10.0000 mg | ORAL_TABLET | Freq: Every day | ORAL | Status: DC

## 2015-09-22 MED ORDER — TRAMADOL-ACETAMINOPHEN 37.5-325 MG PO TABS: 1.0000 | ORAL_TABLET | Freq: Three times a day (TID) | ORAL | Status: DC | PRN

## 2015-09-22 MED ORDER — NYSTATIN 100000 UNIT/GM EX POWD: CUTANEOUS | Status: DC

## 2015-09-22 MED ORDER — RANITIDINE HCL 300 MG PO TABS: 300.0000 mg | ORAL_TABLET | Freq: Every day | ORAL | Status: DC

## 2015-09-22 MED ORDER — METOPROLOL TARTRATE 25 MG PO TABS: ORAL_TABLET | ORAL | Status: DC

## 2015-09-22 MED ORDER — ESTROGENS, CONJUGATED 0.625 MG/GM VA CREA: TOPICAL_CREAM | VAGINAL | Status: DC

## 2015-09-22 MED ORDER — CLONIDINE HCL 0.1 MG PO TABS: 0.1000 mg | ORAL_TABLET | Freq: Two times a day (BID) | ORAL | Status: DC | PRN

## 2015-09-22 MED ORDER — GABAPENTIN 300 MG PO CAPS: ORAL_CAPSULE | ORAL | Status: DC

## 2015-09-22 MED ORDER — ONDANSETRON 4 MG PO TBDP: 4.0000 mg | ORAL_TABLET | Freq: Two times a day (BID) | ORAL | Status: DC | PRN

## 2015-09-22 MED ORDER — DICLOFENAC SODIUM 1 % TD GEL: TRANSDERMAL | Status: DC

## 2015-09-22 MED ORDER — OXYBUTYNIN CHLORIDE ER 10 MG PO TB24: ORAL_TABLET | ORAL | Status: DC

## 2015-09-22 MED ORDER — TRAZODONE HCL 100 MG PO TABS: ORAL_TABLET | ORAL | Status: DC

## 2015-09-22 NOTE — Progress Notes (Signed)
BP 146/98 mmHg  Pulse 94  Temp(Src) 98.6 F (37 C) (Oral)  Wt 190 lb (86.183 kg)  SpO2 98%   CC: 6 mo f/u visit  Subjective:    Patient ID: Brianna Woods, female    DOB: 1935/02/19, 81 y.o.   MRN: [id number]  HPI: Brianna Woods is a 81 y.o. female presenting on 09/22/2015 for Follow-up   Seen by Allie Bossier on 09/16/2015 with BLE nonpitting pedal edema. Thought possibly related to increase sodium intake over holidays. Treated with elevation, decreased salt intake, compression stockings. This has improved some.   BP mildly elevated today - didn't take clonidine yet. Usually takes PRN.   Multinodular goiter with subclinical hyperthyroidism on long term methimazole - followed by Dr Gabriel Carina, appreciate her care.  HLD - on atorvastatin MWF. Last visit we started CoQ 10.   On premarin vaginally since 2011 for vaginal atrophy. Uterus and ovaries remain. Last saw OBGYN 2013 with normal pap smear per patient (Dr Berdine Addison).   Nephrolithiasis - R non-obstructing 9x29mm. H/o esophageal bleed after last lithotripsy from [facility].   Requests rollator Rx.   Relevant past medical, surgical, family and social history reviewed and updated as indicated. Interim medical history since our last visit reviewed. Allergies and medications reviewed and updated. Current Outpatient Prescriptions on File Prior to Visit  Medication Sig  . albuterol (VENTOLIN HFA) 108 (90 BASE) MCG/ACT inhaler Inhale 2 puffs into the lungs every 6 (six) hours as needed. for wheezing  . cholecalciferol 5000 UNITS TABS Take 5,000 Units by mouth daily.  Marland Kitchen co-enzyme Q-10 30 MG capsule Take 1 capsule (30 mg total) by mouth daily.  . ferrous sulfate 325 (65 FE) MG tablet Take 1 tablet (325 mg total) by mouth daily with breakfast.  . sucralfate (CARAFATE) 1 G tablet Take 1 tablet (1 g total) by mouth 2 (two) times daily.  . vitamin C (ASCORBIC ACID) 500 MG tablet Take 500 mg by mouth daily.  . [DISCONTINUED] pirbuterol (MAXAIR) 200 MCG/INH inhaler  Inhale 2 puffs into the lungs 4 (four) times daily.   No current facility-administered medications on file prior to visit.    Review of Systems Per HPI unless specifically indicated in ROS section     Objective:    BP 146/98 mmHg  Pulse 94  Temp(Src) 98.6 F (37 C) (Oral)  Wt 190 lb (86.183 kg)  SpO2 98%  Wt Readings from Last 3 Encounters:  09/22/15 190 lb (86.183 kg)  09/16/15 195 lb (88.451 kg)  07/26/15 191 lb (86.637 kg)    Physical Exam  Constitutional: She appears well-developed and well-nourished. No distress.  HENT:  Mouth/Throat: Oropharynx is clear and moist. No oropharyngeal exudate.  Eyes: Conjunctivae and EOM are normal. Pupils are equal, round, and reactive to light. No scleral icterus.  Cardiovascular: Normal rate, regular rhythm, normal heart sounds and intact distal pulses.   No murmur heard. Pulmonary/Chest: Effort normal and breath sounds normal. No respiratory distress. She has no wheezes. She has no rales.  Musculoskeletal: She exhibits edema (nonpitting).  Skin: Skin is warm and dry. No rash noted.  Psychiatric: She has a normal mood and affect.  Nursing note and vitals reviewed.  Results for orders placed or performed in visit on 07/26/15  Comprehensive metabolic panel  Result Value Ref Range   Sodium 142 135 - 145 mEq/L   Potassium 3.8 3.5 - 5.1 mEq/L   Chloride 105 96 - 112 mEq/L   CO2 29 19 - 32 mEq/L  Glucose, Bld 104 (H) 70 - 99 mg/dL   BUN 7 6 - 23 mg/dL   Creatinine, Ser 0.85 0.40 - 1.20 mg/dL   Total Bilirubin 0.4 0.2 - 1.2 mg/dL   Alkaline Phosphatase 73 39 - 117 U/L   AST 13 0 - 37 U/L   ALT 8 0 - 35 U/L   Total Protein 7.2 6.0 - 8.3 g/dL   Albumin 4.1 3.5 - 5.2 g/dL   Calcium 9.4 8.4 - 10.5 mg/dL   GFR 83.05 >60.00 mL/min  CBC with Differential/Platelet  Result Value Ref Range   WBC 7.6 4.0 - 10.5 K/uL   RBC 3.98 3.87 - 5.11 Mil/uL   Hemoglobin 11.9 (L) 12.0 - 15.0 g/dL   HCT 35.9 (L) 36.0 - 46.0 %   MCV 90.1 78.0 - 100.0  fl   MCHC 33.0 30.0 - 36.0 g/dL   RDW 14.2 11.5 - 15.5 %   Platelets 229.0 150.0 - 400.0 K/uL   Neutrophils Relative % 62.7 43.0 - 77.0 %   Lymphocytes Relative 24.8 12.0 - 46.0 %   Monocytes Relative 10.4 3.0 - 12.0 %   Eosinophils Relative 1.8 0.0 - 5.0 %   Basophils Relative 0.3 0.0 - 3.0 %   Neutro Abs 4.7 1.4 - 7.7 K/uL   Lymphs Abs 1.9 0.7 - 4.0 K/uL   Monocytes Absolute 0.8 0.1 - 1.0 K/uL   Eosinophils Absolute 0.1 0.0 - 0.7 K/uL   Basophils Absolute 0.0 0.0 - 0.1 K/uL  Lipase  Result Value Ref Range   Lipase 11.0 11.0 - 59.0 U/L  POCT Urinalysis Dipstick  Result Value Ref Range   Color, UA Yellow    Clarity, UA Clear    Glucose, UA Negative    Bilirubin, UA Negative    Ketones, UA Negative    Spec Grav, UA 1.025    Blood, UA Negative    pH, UA 6.0    Protein, UA Negative    Urobilinogen, UA 1.0    Nitrite, UA Negative    Leukocytes, UA Negative Negative      Assessment & Plan:   Problem List Items Addressed This Visit    Urinary incontinence    Longstanding premarin use since 2011 for vaginal atrophy and urine incontinence. Uterus and ovaries remain.  Continues oxybutynin XL as well. No vaginal bleeding. Will suggest trial lower estrogen dose of twice weekly (currently taking three times weekly) I have also requested records from last OBGYN eval 2013.       Relevant Medications   oxybutynin (DITROPAN-XL) 10 MG 24 hr tablet   Pedal edema    Recent trouble - now better. Thought related to increased sodium intake during holiday season      Nodular goiter, toxic or with hyperthyroidism    Appreciate endo care of patient.      Relevant Medications   metoprolol tartrate (LOPRESSOR) 25 MG tablet   HTN (hypertension) - Primary    bp remaining mildly elevated - will schedule clonidine 0.1mg  daily with extra tablet PRN elevated BP >150/100      Relevant Medications   amLODipine (NORVASC) 10 MG tablet   atorvastatin (LIPITOR) 40 MG tablet   cloNIDine  (CATAPRES) 0.1 MG tablet   metoprolol tartrate (LOPRESSOR) 25 MG tablet   HLD (hyperlipidemia)    Continue atorvastatin MWF + CoQ10 which pt is tolerating.      Relevant Medications   amLODipine (NORVASC) 10 MG tablet   atorvastatin (LIPITOR) 40 MG tablet  cloNIDine (CATAPRES) 0.1 MG tablet   metoprolol tartrate (LOPRESSOR) 25 MG tablet   Fatty pancreas ([place])    On prior imaging.      Atherosclerosis of abdominal aorta ([facility])    Back on statin. Not on aspirin 2/2 gi issues.      Relevant Medications   amLODipine (NORVASC) 10 MG tablet   atorvastatin (LIPITOR) 40 MG tablet   cloNIDine (CATAPRES) 0.1 MG tablet   metoprolol tartrate (LOPRESSOR) 25 MG tablet       Follow up plan: Return in about 3 months (around 12/21/2015), or as needed, for follow up visit.

## 2015-09-22 NOTE — Progress Notes (Signed)
Pre visit review using our clinic review tool, if applicable. No additional management support is needed unless otherwise documented below in the visit note. 

## 2015-09-22 NOTE — Telephone Encounter (Signed)
Pt stated that Dr Darnell Level is writing a rx for a walker with a seat in it. She said that it would e best to mail it to her home address Thank you

## 2015-09-22 NOTE — Patient Instructions (Addendum)
Sign release of information up front for records from GYN evaluation 2013 (Dr Berdine Addison at [facility]). Change clonidine to 0.1mg  daily in the morning with extra tablet if needed for high blood pressures >150/100.  We might add second hormone to balance out estrogen therapy - will send to mail order pharmacy.  Return to see me in 3-4 months for follow up

## 2015-09-23 NOTE — Telephone Encounter (Signed)
Rx mailed to pt as requested

## 2015-09-23 NOTE — Telephone Encounter (Signed)
Rx written and in Kim's box. 

## 2015-09-24 MED ORDER — ESOMEPRAZOLE MAGNESIUM 40 MG PO CPDR: 40.0000 mg | DELAYED_RELEASE_CAPSULE | Freq: Every day | ORAL | Status: DC

## 2015-09-25 ENCOUNTER — Encounter: Payer: Self-pay | Admitting: Family Medicine

## 2015-09-25 DIAGNOSIS — R600 Localized edema: Secondary | ICD-10-CM | POA: Insufficient documentation

## 2015-09-25 NOTE — Telephone Encounter (Signed)
plz notify patient - I would suggest she try trial of premarin cream twice weekly instead of three times weekly and see if urinary symptoms remain controlled.

## 2015-09-25 NOTE — Assessment & Plan Note (Addendum)
Longstanding premarin use since 2011 for vaginal atrophy and urine incontinence. Uterus and ovaries remain.  Continues oxybutynin XL as well. No vaginal bleeding. Will suggest trial lower estrogen dose of twice weekly (currently taking three times weekly) I have also requested records from last OBGYN eval 2013.

## 2015-09-25 NOTE — Assessment & Plan Note (Signed)
Continue atorvastatin MWF + CoQ10 which pt is tolerating.

## 2015-09-25 NOTE — Assessment & Plan Note (Signed)
Appreciate endo care of patient.

## 2015-09-25 NOTE — Assessment & Plan Note (Addendum)
On prior imaging.

## 2015-09-25 NOTE — Assessment & Plan Note (Addendum)
Back on statin. Not on aspirin 2/2 gi issues.

## 2015-09-25 NOTE — Assessment & Plan Note (Signed)
Recent trouble - now better. Thought related to increased sodium intake during holiday season

## 2015-09-25 NOTE — Assessment & Plan Note (Addendum)
bp remaining mildly elevated - will schedule clonidine 0.1mg  daily with extra tablet PRN elevated BP >150/100

## 2015-09-28 ENCOUNTER — Telehealth: Payer: Self-pay | Admitting: *Deleted

## 2015-09-28 NOTE — Telephone Encounter (Signed)
Signed an in Kim's box.

## 2015-09-28 NOTE — Telephone Encounter (Signed)
Form faxed

## 2015-09-28 NOTE — Telephone Encounter (Signed)
Rx clarification form in your IN box for signature.

## 2015-09-28 NOTE — Telephone Encounter (Signed)
Patient notified and verbalized understanding. 

## 2015-10-15 ENCOUNTER — Telehealth: Payer: Self-pay | Admitting: Family Medicine

## 2015-10-15 ENCOUNTER — Encounter: Payer: Self-pay | Admitting: Emergency Medicine

## 2015-10-15 ENCOUNTER — Emergency Department: Payer: Medicare PPO

## 2015-10-15 ENCOUNTER — Emergency Department
Admission: EM | Admit: 2015-10-15 | Discharge: 2015-10-15 | Disposition: A | Discharge status: Home / Self Care | Payer: Medicare PPO | Attending: Emergency Medicine | Admitting: Emergency Medicine

## 2015-10-15 DIAGNOSIS — K449 Diaphragmatic hernia without obstruction or gangrene: Secondary | ICD-10-CM | POA: Diagnosis not present

## 2015-10-15 DIAGNOSIS — R600 Localized edema: Secondary | ICD-10-CM | POA: Diagnosis not present

## 2015-10-15 DIAGNOSIS — Z79899 Other long term (current) drug therapy: Secondary | ICD-10-CM | POA: Diagnosis not present

## 2015-10-15 DIAGNOSIS — R109 Unspecified abdominal pain: Secondary | ICD-10-CM | POA: Diagnosis present

## 2015-10-15 DIAGNOSIS — Z880 Allergy status to penicillin: Secondary | ICD-10-CM | POA: Insufficient documentation

## 2015-10-15 DIAGNOSIS — I10 Essential (primary) hypertension: Secondary | ICD-10-CM | POA: Diagnosis not present

## 2015-10-15 DIAGNOSIS — Z791 Long term (current) use of non-steroidal anti-inflammatories (NSAID): Secondary | ICD-10-CM | POA: Insufficient documentation

## 2015-10-15 DIAGNOSIS — R1012 Left upper quadrant pain: Secondary | ICD-10-CM | POA: Insufficient documentation

## 2015-10-15 LAB — COMPREHENSIVE METABOLIC PANEL
ALBUMIN: 3.8 g/dL (ref 3.5–5.0)
ALK PHOS: 81 U/L (ref 38–126)
ALT: 11 U/L — ABNORMAL LOW (ref 14–54)
AST: 17 U/L (ref 15–41)
Anion gap: 8 (ref 5–15)
BILIRUBIN TOTAL: 0.5 mg/dL (ref 0.3–1.2)
BUN: 10 mg/dL (ref 6–20)
CO2: 28 mmol/L (ref 22–32)
Calcium: 9.2 mg/dL (ref 8.9–10.3)
Chloride: 104 mmol/L (ref 101–111)
Creatinine, Ser: 0.93 mg/dL (ref 0.44–1.00)
GFR calc Af Amer: >60 mL/min (ref >60)
GFR calc non Af Amer: 57 mL/min — ABNORMAL LOW (ref >60)
GLUCOSE: 99 mg/dL (ref 65–99)
POTASSIUM: 3.2 mmol/L — AB (ref 3.5–5.1)
Sodium: 140 mmol/L (ref 135–145)
TOTAL PROTEIN: 7.6 g/dL (ref 6.5–8.1)

## 2015-10-15 LAB — URINALYSIS COMPLETE WITH MICROSCOPIC (ARMC ONLY)
BACTERIA UA: NONE SEEN
Bilirubin Urine: NEGATIVE
Glucose, UA: NEGATIVE mg/dL
Ketones, ur: NEGATIVE mg/dL
LEUKOCYTES UA: NEGATIVE
Nitrite: NEGATIVE
PROTEIN: NEGATIVE mg/dL
SPECIFIC GRAVITY, URINE: 1.024 (ref 1.005–1.030)
pH: 5 (ref 5.0–8.0)

## 2015-10-15 LAB — CBC
HEMATOCRIT: 36.2 % (ref 35.0–47.0)
Hemoglobin: 11.9 g/dL — ABNORMAL LOW (ref 12.0–16.0)
MCH: 29.3 pg (ref 26.0–34.0)
MCHC: 33 g/dL (ref 32.0–36.0)
MCV: 88.9 fL (ref 80.0–100.0)
Platelets: 214 10*3/uL (ref 150–440)
RBC: 4.07 MIL/uL (ref 3.80–5.20)
RDW: 13.7 % (ref 11.5–14.5)
WBC: 11.6 10*3/uL — ABNORMAL HIGH (ref 3.6–11.0)

## 2015-10-15 LAB — TROPONIN I: TROPONIN I: 0.03 ng/mL (ref <0.031)

## 2015-10-15 LAB — LIPASE, BLOOD: Lipase: 17 U/L (ref 11–51)

## 2015-10-15 IMAGING — CT CT ABD-PELV W/O CM
2 of 4 series · 16 of 46 positions shown, 18 images · non-contrast
Comparison: [DATE]

CLINICAL DATA: Abdominal pain and pelvic pain. Lower extremity
swelling.

EXAM:
CT ABDOMEN AND PELVIS WITHOUT CONTRAST
TECHNIQUE: Multidetector CT imaging of the abdomen and pelvis was performed
following the standard protocol without IV contrast.

[Series 2: routine abd pel wo · axial · 0.68mm/px · z∈[-1354,-958]mm · 13 of 87 slices shown, 15 images]
[im 4/87  soft-tissue]
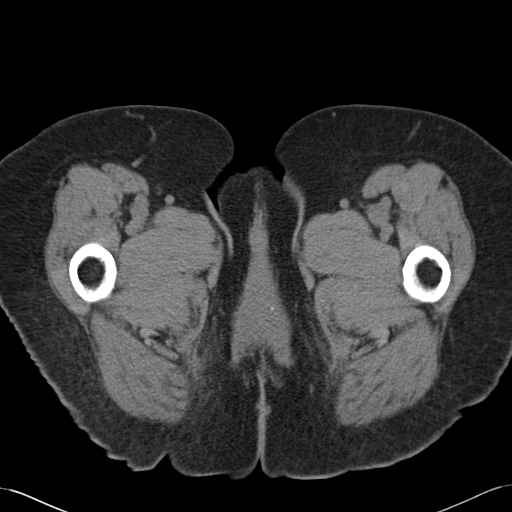
[im 4/87  bone]
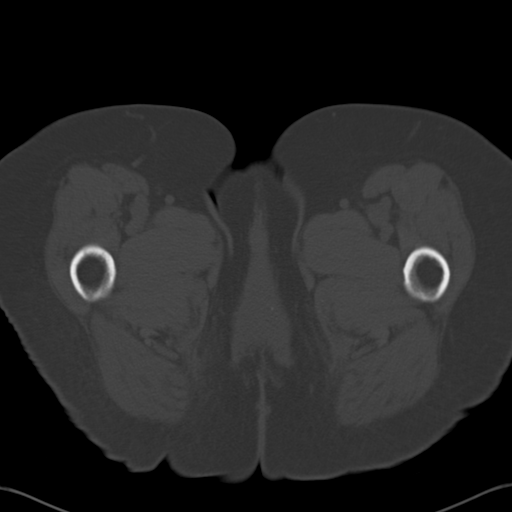
[im 10/87  soft-tissue]
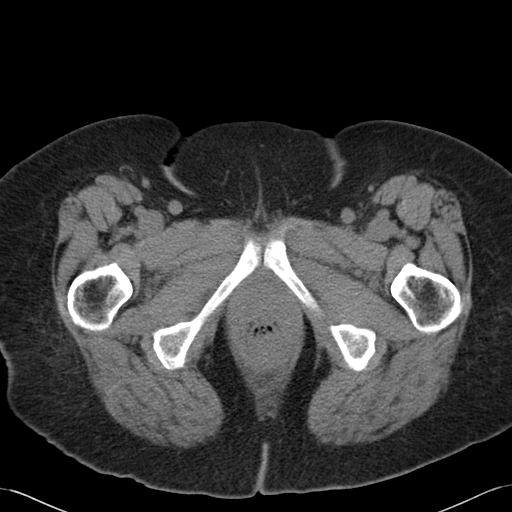
[im 17/87  soft-tissue]
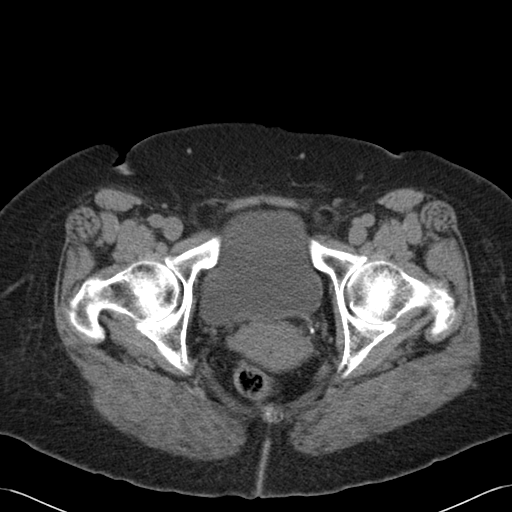
[im 24/87  soft-tissue]
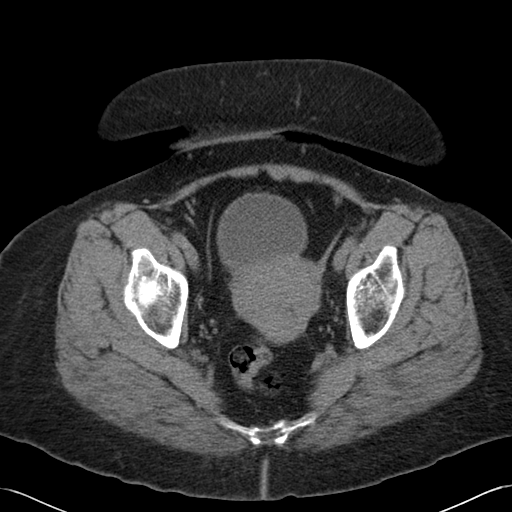
[im 30/87  soft-tissue]
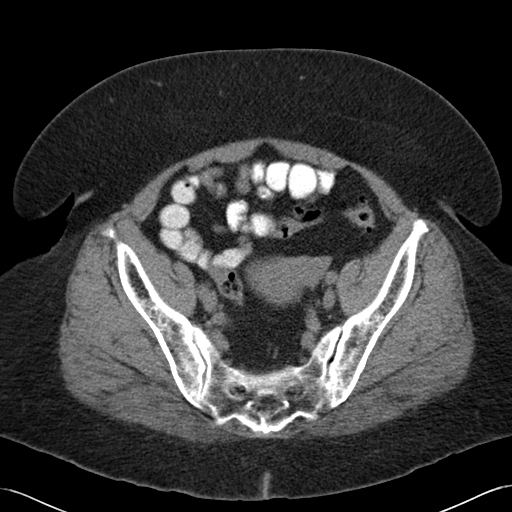
[im 37/87  soft-tissue]
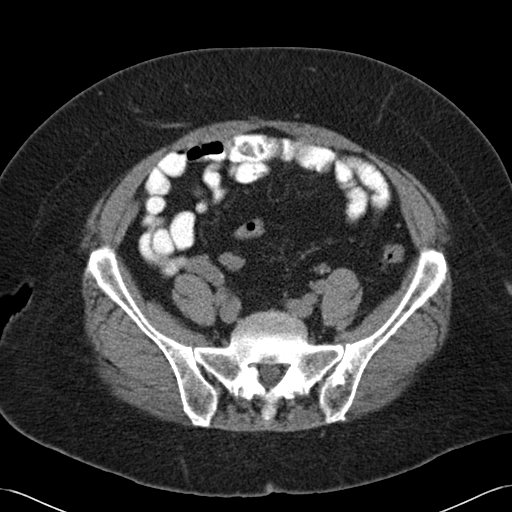
[im 44/87  soft-tissue]
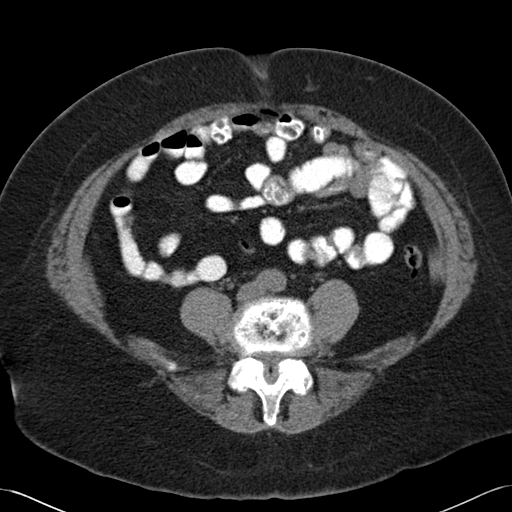
[im 50/87  soft-tissue]
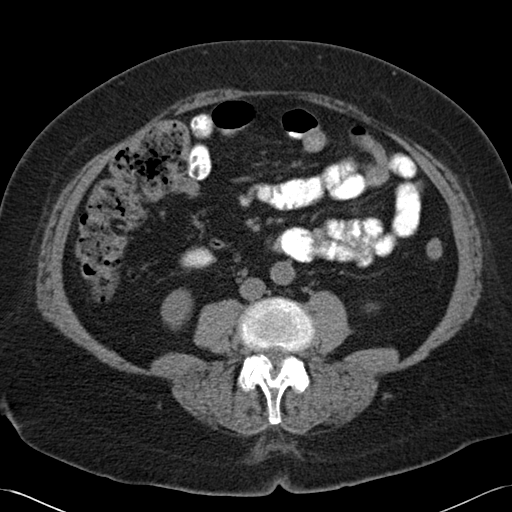
[im 57/87  soft-tissue]
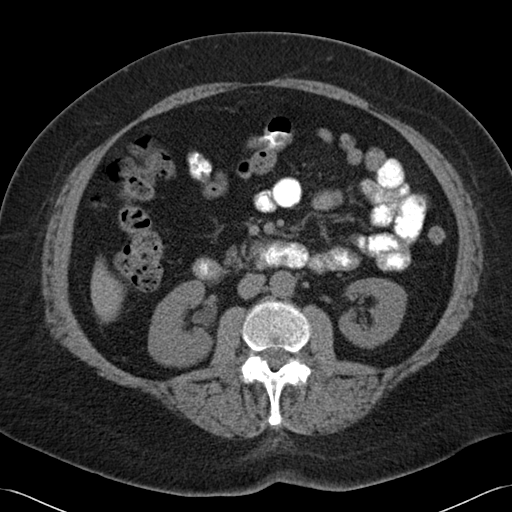
[im 57/87  bone]
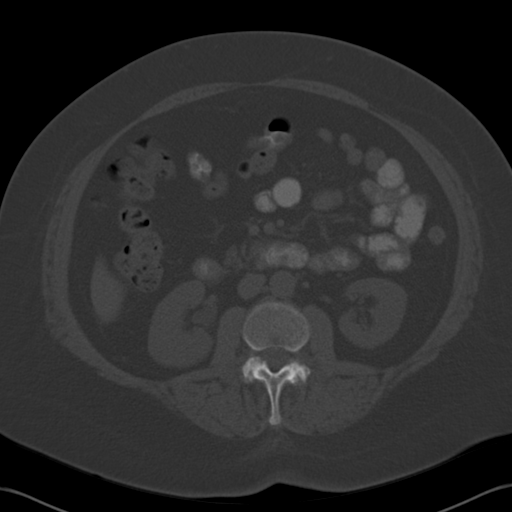
[im 63/87  soft-tissue]
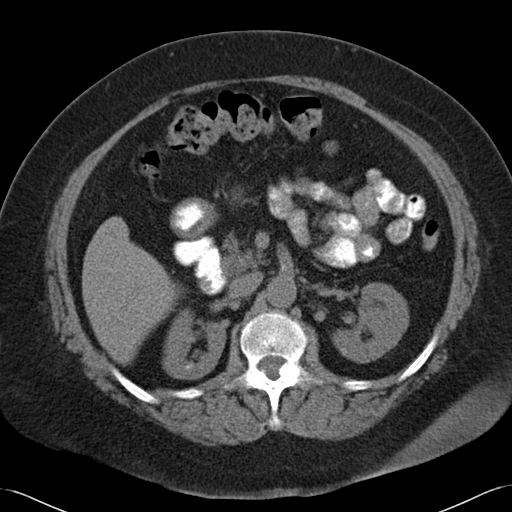
[im 70/87  soft-tissue]
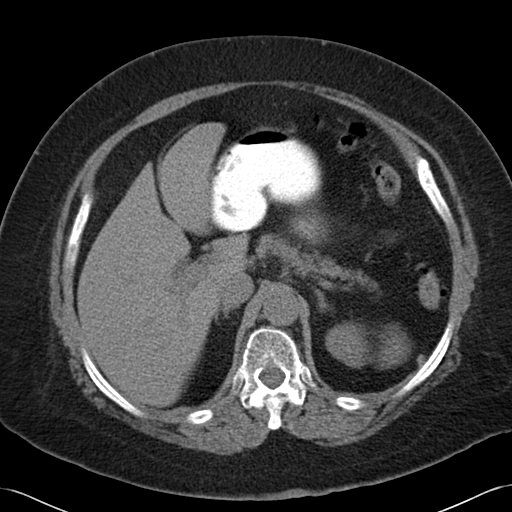
[im 77/87  soft-tissue]
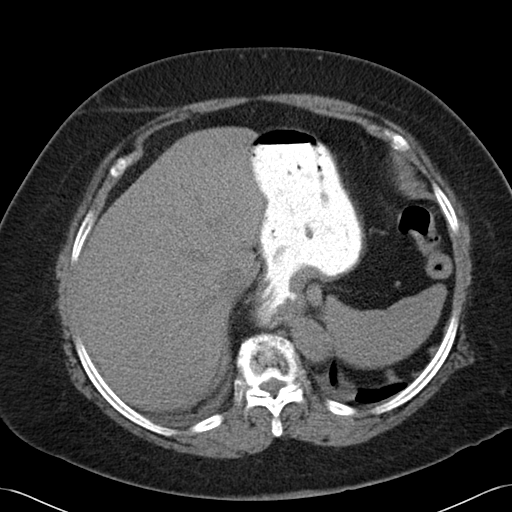
[im 83/87  soft-tissue]
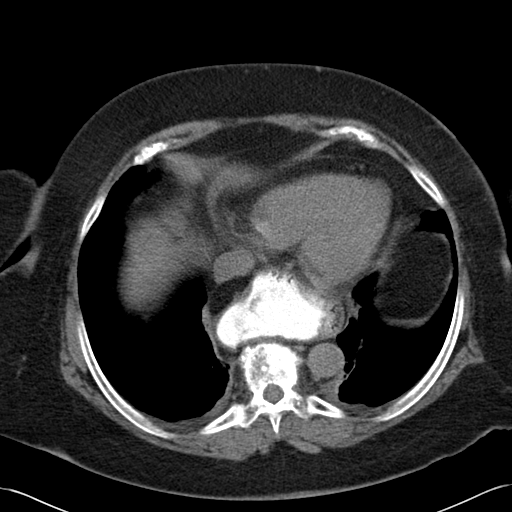

[Series 5: cor routine abd pel wo · coronal · 0.66mm/px · 3 of 149 slices shown]
[im 50/149  soft-tissue]
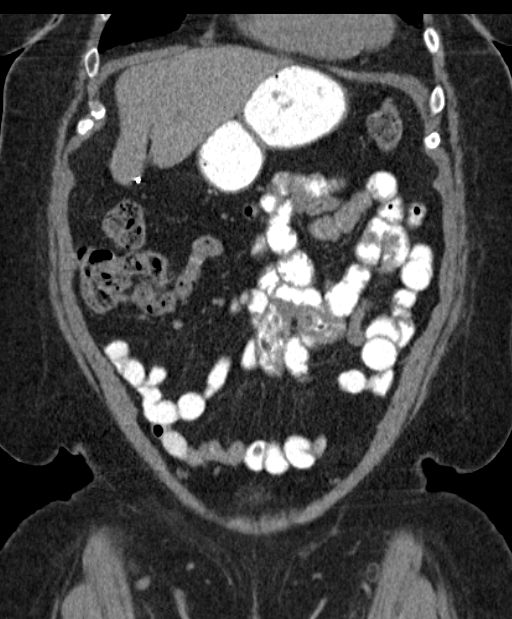
[im 66/149  soft-tissue]
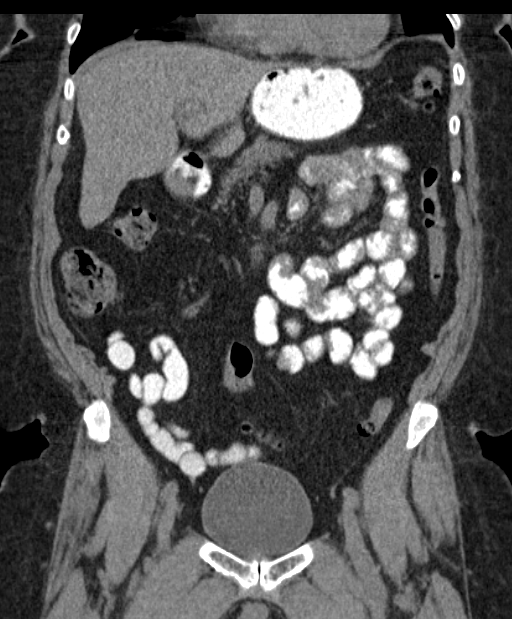
[im 83/149  soft-tissue]
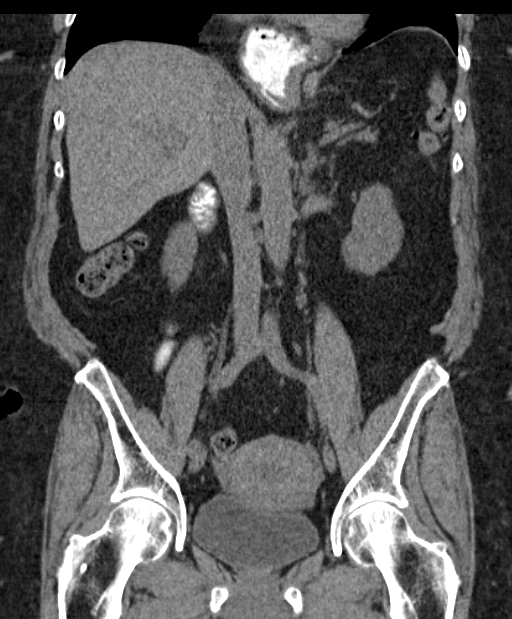

[16 of 46 positions shown; findings below may reference images not displayed]

FINDINGS: Lower chest: No pleural fluid identified. Scar versus subsegmental
atelectasis noted in the medial left base.

Hepatobiliary: The liver appears normal. Previous cholecystectomy.
No biliary dilatation.

Pancreas: Negative

Spleen: Negative

Adrenals/Urinary Tract: The adrenal glands are normal. Stone within
the upper pole of the right kidney is again noted measuring 1 cm,
image 24 of series 2. No hydronephrosis or hydroureter identified.
No ureteral calculi noted. The urinary bladder appears normal.

Stomach/Bowel: Large hiatal hernia is present. The small bowel loops
have a normal course and caliber. There is no bowel obstruction
identified. The appendix is visualized and appears normal.
Unremarkable appearance of the proximal colon. Distal colonic
diverticula noted without acute inflammation. No pathologic
dilatation of the bowel loops noted.

Vascular/Lymphatic: Mild aortic atherosclerotic calcifications
noted. No enlarged retroperitoneal or mesenteric adenopathy. No
enlarged pelvic or inguinal lymph nodes. There is no pelvic or
inguinal adenopathy noted.

Reproductive: The endometrium appears unusually prominent for a
premenopausal female. There is a fibroid noted within the lower
uterine segment measuring 2.3 cm.

Other: There is no ascites or focal fluid collections within the
abdomen or pelvis.

Musculoskeletal: Degenerative disc disease noted at the L5-S1 level.
IMPRESSION: 1. No acute findings identified within the abdomen or pelvis.
2. Nonobstructing right renal calculus.
3. Large hiatal hernia.
4. The endometrium appears prominent for a postmenopausal female.
Consider followup imaging with nonemergent pelvic sonogram.

## 2015-10-15 MED ORDER — BARIUM SULFATE 2.1 % PO SUSP: 900.0000 mL | Freq: Once | ORAL | Status: DC

## 2015-10-15 MED ORDER — CLONIDINE HCL 0.1 MG PO TABS
ORAL_TABLET | ORAL | Status: AC
  Administered 2015-10-15: 0.1 mg via ORAL
  Filled 2015-10-15: qty 1

## 2015-10-15 MED ORDER — FUROSEMIDE 20 MG PO TABS: 20.0000 mg | ORAL_TABLET | Freq: Every day | ORAL | Status: DC

## 2015-10-15 MED ORDER — POTASSIUM CHLORIDE CRYS ER 20 MEQ PO TBCR
40.0000 meq | EXTENDED_RELEASE_TABLET | Freq: Once | ORAL | Status: AC
  Administered 2015-10-15: 40 meq via ORAL
  Filled 2015-10-15: qty 2

## 2015-10-15 MED ORDER — POTASSIUM CHLORIDE ER 10 MEQ PO TBCR: 10.0000 meq | EXTENDED_RELEASE_TABLET | Freq: Every day | ORAL | Status: DC

## 2015-10-15 MED ORDER — ONDANSETRON 4 MG PO TBDP
4.0000 mg | ORAL_TABLET | Freq: Once | ORAL | Status: AC
  Administered 2015-10-15: 4 mg via ORAL
  Filled 2015-10-15: qty 1

## 2015-10-15 MED ORDER — TRAMADOL HCL 50 MG PO TABS
50.0000 mg | ORAL_TABLET | ORAL | Status: AC
  Administered 2015-10-15: 50 mg via ORAL
  Filled 2015-10-15: qty 1

## 2015-10-15 MED ORDER — CLONIDINE HCL 0.1 MG PO TABS
0.1000 mg | ORAL_TABLET | ORAL | Status: AC
  Administered 2015-10-15: 0.1 mg via ORAL

## 2015-10-15 NOTE — Discharge Instructions (Signed)
You were seen in the emergency room for abdominal pain. It is important that you follow up closely with your primary care doctor in the next couple of days. ° °Please return to the emergency room right away if you are to develop a fever, severe nausea, your pain becomes severe or worsens, you are unable to keep food down, begin vomiting any dark or bloody fluid, you develop any dark or bloody stools, feel dehydrated, or other new concerns or symptoms arise. ° ° °Abdominal Pain, Adult °Many things can cause abdominal pain. Usually, abdominal pain is not caused by a disease and will improve without treatment. It can often be observed and treated at home. Your health care provider will do a physical exam and possibly order blood tests and X-rays to help determine the seriousness of your pain. However, in many cases, more time must pass before a clear cause of the pain can be found. Before that point, your health care provider may not know if you need more testing or further treatment. °HOME CARE INSTRUCTIONS °Monitor your abdominal pain for any changes. The following actions may help to alleviate any discomfort you are experiencing: °· Only take over-the-counter or prescription medicines as directed by your health care provider. °· Do not take laxatives unless directed to do so by your health care provider. °· Try a clear liquid diet (broth, tea, or water) as directed by your health care provider. Slowly move to a bland diet as tolerated. °SEEK MEDICAL CARE IF: °· You have unexplained abdominal pain. °· You have abdominal pain associated with nausea or diarrhea. °· You have pain when you urinate or have a bowel movement. °· You experience abdominal pain that wakes you in the night. °· You have abdominal pain that is worsened or improved by eating food. °· You have abdominal pain that is worsened with eating fatty foods. °· You have a fever. °SEEK IMMEDIATE MEDICAL CARE IF: °· Your pain does not go away within 2  hours. °· You keep throwing up (vomiting). °· Your pain is felt only in portions of the abdomen, such as the right side or the left lower portion of the abdomen. °· You pass bloody or black tarry stools. °MAKE SURE YOU: °· Understand these instructions. °· Will watch your condition. °· Will get help right away if you are not doing well or get worse. °  °This information is not intended to replace advice given to you by your health care provider. Make sure you discuss any questions you have with your health care provider. °  °Document Released: 06/07/2005 Document Revised: 05/19/2015 Document Reviewed: 05/07/2013 °Elsevier Interactive Patient Education ©2016 [employer] ° °

## 2015-10-15 NOTE — ED Notes (Signed)
Pt completed first bottle of barium

## 2015-10-15 NOTE — ED Notes (Signed)
Pt updated on plan of care. Waiting for CT to arrive to drink oral contrast.

## 2015-10-15 NOTE — Telephone Encounter (Signed)
Patient is currently at [facility] ED for evaluation.

## 2015-10-15 NOTE — Telephone Encounter (Signed)
[facility]  Patient Name: Brianna Woods  DOB: 1935-01-06    Initial Comment Caller states, swelling badly, now fingers, legs, feet, and abd    Nurse Assessment  Nurse: Wayne Sever, RN, Tillie Rung Date/Time (Eastern Time): 10/15/2015 2:18:11 PM  Confirm and document reason for call. If symptomatic, describe symptoms. You must click the next button to save text entered. ---Caller states she is swelling. Her rings will not come off her fingers and her abdomen is also swollen  Has the patient traveled out of the country within the last 30 days? ---Not Applicable  Does the patient have any new or worsening symptoms? ---Yes  Will a triage be completed? ---Yes  Related visit to physician within the last 2 weeks? ---N/A  Does the PT have any chronic conditions? (i.e. diabetes, asthma, etc.) ---Yes  List chronic conditions. ---Fibromyalgia, HTN, Kidney Stones,  Is this a behavioral health or substance abuse call? ---No     Guidelines    Guideline Title Affirmed Question Affirmed Notes  Leg Swelling and Edema SEVERE leg swelling (e.g., swelling extends above knee, entire leg is swollen, weeping fluid)    Final Disposition User   See Physician within 4 Hours (or PCP triage) Wayne Sever, RN, Tillie Rung    Comments  No appointments left for the day, caller sent to ED   Referrals  [facility] - ED   Disagree/Comply: Comply

## 2015-10-15 NOTE — ED Notes (Signed)
Daughter states while out shopping today, patient began feeling tired, c/o abdominal pain. Also patient c/o abdominal and lower extremities swelling x 1 month.  Seen by PCP for swelling, started a salt - Free diet.  To follow up with PCP in April.

## 2015-10-15 NOTE — ED Provider Notes (Signed)
[facility] Emergency Department Provider Note  ____________________________________________  Time seen: Approximately 4:29 PM  I have reviewed the triage vital signs and the nursing notes.   HISTORY  Chief Complaint Fatigue and Abdominal Pain    HPI Brianna Woods is a 81 y.o. female since for evaluation of left-sided abdominal discomfort occurring while walking at [place]. Denies chest pain or trouble breathing.  Describes an achy pain over the left mid abdomen with no associated nausea but no vomiting. No loose stools diarrhea. No history of stomach ulcers. Does have a history kidney stones reports she had similar symptoms with this in the past.  No fevers chills or dysuria. Patient does states she's had increasing swelling in her legs over the last 2 months. She thinks that she may need to go on a "water pill" and has been seeing her primary care doctor for this.   Past Medical History  Diagnosis Date  . HLD (hyperlipidemia)   . HTN (hypertension)   . Fibromyalgia     per pt, no records of this   . History of ulcer disease     per pt, no records of this  . History of nephrolithiasis     s/p surgery R kidney (62mm) 11/2009  . Mixed incontinence     on ditropan  . GERD (gastroesophageal reflux disease)     and esoph stricture s/p dilation 2008 HH by CT 2016  . Barrett's esophagus     on EGD 2008, EGD WNL 2013  . Nodular goiter, toxic or with hyperthyroidism     h/o toxic, on methimazole, known large left thyroid nodule 08/2013 s/p beign biopsy per patient 2010, saw Dr. Gabriel Carina, Rad I ablation 04/2014  . Asthma     per pt  . History of CVA (cerebrovascular accident) 2008    "I've had several TIAs"  . Anemia   . History of chicken pox   . History of cardiac murmur   . Vitamin D deficiency   . Arthritis   . History of right bundle branch block 2011  . History of anxiety     was on prozac then effexor (pt denies h/o anxiety/depression)  .  Diverticulosis 2014    sigmoid on colonoscopy  . Kidney cyst, acquired 07/2015    by CT, rec rpt renal MRI in 6 months  . CAD (coronary artery disease) 07/2015    of LAD by CT  . Atherosclerosis of abdominal aorta ([place]) 07/2015    by CT  . Fatty pancreas ([place]) 07/2015    by CT    Patient Active Problem List   Diagnosis Date Noted  . Pedal edema 09/25/2015  . Generalized abdominal pain 07/26/2015  . Kidney cyst, acquired 07/13/2015  . CAD (coronary artery disease) 07/13/2015  . Atherosclerosis of abdominal aorta ([place]) 07/13/2015  . Fatty pancreas ([place]) 07/13/2015  . Health maintenance examination 03/22/2015  . Intertrigo 03/22/2015  . Advanced care planning/counseling discussion 09/21/2014  . Medicare annual wellness visit, subsequent 03/20/2014  . Urinary incontinence 03/09/2014  . Chest discomfort 08/15/2013  . IDA (iron deficiency anemia) 04/01/2013  . Anxiety attack 05/18/2012  . History of right bundle branch block   . Vitamin D deficiency 02/10/2012  . Dizziness 01/22/2012  . Fibromyalgia   . History of CVA (cerebrovascular accident)   . Asthma   . Nodular goiter, toxic or with hyperthyroidism   . GERD (gastroesophageal reflux disease)   . Mixed incontinence   . HTN (hypertension)   .  HLD (hyperlipidemia)     Past Surgical History  Procedure Laterality Date  . Cholecystectomy  2006  . Cataract extraction  1990    w/ implants  . Lithotripsy Right 2011  . Esophagogastroduodenoscopy  11/2011    LA grade A esophagitis lower 1/3, dilated, med HH, o/w WNL - path: + GERD, no barrett's - f/u 11/2016  . Dexa      no records received  . Colonoscopy  10/02/12    diverticulosis, o/w WNL ([place])  . Thyroid uptake scan  04/2014    unifrm uptake, enlarged thyroid consistent with grave's dz    Current Outpatient Rx  Name  Route  Sig  Dispense  Refill  . albuterol (VENTOLIN HFA) 108 (90 BASE) MCG/ACT inhaler   Inhalation   Inhale 2 puffs into the lungs every 6 (six) hours as  needed. for wheezing   54 g   3   . amLODipine (NORVASC) 10 MG tablet   Oral   Take 1 tablet (10 mg total) by mouth daily.   90 tablet   3   . atorvastatin (LIPITOR) 40 MG tablet   Oral   Take 1 tablet (40 mg total) by mouth every Monday, Wednesday, and Friday.   60 tablet   3   . cholecalciferol 5000 UNITS TABS   Oral   Take 5,000 Units by mouth daily.   90 tablet   3   . cloNIDine (CATAPRES) 0.1 MG tablet   Oral   Take 1 tablet (0.1 mg total) by mouth 2 (two) times daily as needed (take one daily in am, second if BP >150/100).   180 tablet   3   . co-enzyme Q-10 30 MG capsule   Oral   Take 1 capsule (30 mg total) by mouth daily.         Marland Kitchen conjugated estrogens (PREMARIN) vaginal cream      APPLY 1/4 APPLICATORFUL VAGINALLY THREE TIMES A WEEK   30 g   11   . diclofenac sodium (VOLTAREN) 1 % GEL      APPLY 4 GRAMS TOPICALLY FOUR TIMES DAILY AS NEEDED.   100 g   1   . esomeprazole (NEXIUM) 40 MG capsule   Oral   Take 1 capsule (40 mg total) by mouth daily.   90 capsule   3     Try once daily dosing and notify me if breakthroug ...   . ferrous sulfate 325 (65 FE) MG tablet   Oral   Take 1 tablet (325 mg total) by mouth daily with breakfast.         . furosemide (LASIX) 20 MG tablet   Oral   Take 1 tablet (20 mg total) by mouth daily.   5 tablet   0   . gabapentin (NEURONTIN) 300 MG capsule      TAKE 1 CAPSULE THREE TIMES DAILY   270 capsule   3   . metoprolol tartrate (LOPRESSOR) 25 MG tablet      TAKE 1 TABLET TWICE DAILY   180 tablet   3   . nystatin (MYCOSTATIN/NYSTOP) 100000 UNIT/GM POWD      AAA BID   60 g   0   . ondansetron (ZOFRAN-ODT) 4 MG disintegrating tablet   Oral   Take 1 tablet (4 mg total) by mouth 2 (two) times daily as needed for nausea or vomiting.   20 tablet   1     Use ODT instead of regular tablet. Thanks!   Marland Kitchen  oxybutynin (DITROPAN-XL) 10 MG 24 hr tablet      TAKE 1 TABLET EVERY DAY   90 tablet   3    . potassium chloride (K-DUR) 10 MEQ tablet   Oral   Take 1 tablet (10 mEq total) by mouth daily.   5 tablet   0   . ranitidine (ZANTAC) 300 MG tablet   Oral   Take 1 tablet (300 mg total) by mouth at bedtime.   90 tablet   3   . sucralfate (CARAFATE) 1 G tablet   Oral   Take 1 tablet (1 g total) by mouth 2 (two) times daily.   360 tablet   3   . traMADol-acetaminophen (ULTRACET) 37.5-325 MG tablet   Oral   Take 1 tablet by mouth every 8 (eight) hours as needed. for pain   60 tablet   3   . traZODone (DESYREL) 100 MG tablet      TAKE 1/2 TO 1 TABLET AT BEDTIME   90 tablet   3   . vitamin C (ASCORBIC ACID) 500 MG tablet   Oral   Take 500 mg by mouth daily.           Allergies Ivp dye; Penicillins; and Statins  Family History  Problem Relation Age of Onset  . Hyperlipidemia Mother   . Hypertension Mother   . Stroke Father     hemorrhage  . Other Brother     TB  . Coronary artery disease Brother   . Aneurysm Son 50    sudden death  . Diabetes Maternal Aunt   . Cancer Brother 98    lung  . Aneurysm Father 34    deceased    Social History Social History  Substance Use Topics  . Smoking status: Never Smoker   . Smokeless tobacco: Never Used  . Alcohol Use: No    Review of Systems Constitutional: No fever/chills Eyes: No visual changes. ENT: No sore throat. Cardiovascular: Denies chest pain. Respiratory: Denies shortness of breath. Gastrointestinal: no vomiting.  No diarrhea.  No constipation. Genitourinary: Negative for dysuria. Musculoskeletal: Negative for back pain. Skin: Negative for rash. Neurological: Negative for headaches, focal weakness or numbness.  10-point ROS otherwise negative.  ____________________________________________   PHYSICAL EXAM:  VITAL SIGNS: ED Triage Vitals  Enc Vitals Group     BP 10/15/15 1530 181/114 mmHg     Pulse Rate 10/15/15 1530 85     Resp 10/15/15 1530 18     Temp 10/15/15 1530 98.5 F (36.9  C)     Temp Source 10/15/15 1530 Oral     SpO2 10/15/15 1530 98 %     Weight 10/15/15 1530 190 lb (86.183 kg)     Height 10/15/15 1530 5' (1.524 m)     Head Cir --      Peak Flow --      Pain Score 10/15/15 1531 7     Pain Loc --      Pain Edu? --      Excl. in [place]? --    Constitutional: Alert and oriented. Well appearing and in no acute distress. Eyes: Conjunctivae are normal. PERRL. EOMI. Head: Atraumatic. Nose: No congestion/rhinnorhea. Mouth/Throat: Mucous membranes are moist.  Oropharynx non-erythematous. Neck: No stridor.   Cardiovascular: Normal rate, regular rhythm. Grossly normal heart sounds.  Good peripheral circulation. Respiratory: Normal respiratory effort.  No retractions. Lungs CTAB. Gastrointestinal: Soft and nontender except for mild to moderate discomfort without rebound or guarding primarily over  the left upper quadrant . No distention. No abdominal bruits. No CVA tenderness. Musculoskeletal: No lower extremity tenderness  does have about 2+ lower extremity edema bilateral. No venous cords. No calf tenderness.Neurologic:  Normal speech and language. No gross focal neurologic deficits are appreciated. Skin:  Skin is warm, dry and intact. No rash noted. Psychiatric: Mood and affect are normal. Speech and behavior are normal.  ____________________________________________   LABS (all labs ordered are listed, but only abnormal results are displayed)  Labs Reviewed  COMPREHENSIVE METABOLIC PANEL - Abnormal; Notable for the following:    Potassium 3.2 (*)    ALT 11 (*)    GFR calc non Af Amer 57 (*)    All other components within normal limits  CBC - Abnormal; Notable for the following:    WBC 11.6 (*)    Hemoglobin 11.9 (*)    All other components within normal limits  URINALYSIS COMPLETEWITH MICROSCOPIC ([facility] ONLY) - Abnormal; Notable for the following:    Color, Urine YELLOW (*)    APPearance CLEAR (*)    Hgb urine dipstick 1+ (*)    Squamous Epithelial /  LPF 0-5 (*)    All other components within normal limits  LIPASE, BLOOD  TROPONIN I   ____________________________________________  EKG  Reviewed and read by me at 1540 Normal sinus rhythm Ventricular rate 80 QRS 1:30 QTc 490 Right bundle-branch block, otherwise normal ECG. Normal T waves except for those associated with a normal right bundle ____________________________________________  RADIOLOGY  CT Abdomen Pelvis Wo Contrast (Final result) Result time: 10/15/15 18:31:08   Final result by Rad Results In Interface (10/15/15 18:31:08)   Narrative:   CLINICAL DATA: Abdominal pain and pelvic pain. Lower extremity swelling.  EXAM: CT ABDOMEN AND PELVIS WITHOUT CONTRAST  TECHNIQUE: Multidetector CT imaging of the abdomen and pelvis was performed following the standard protocol without IV contrast.  COMPARISON: 07/29/2015  FINDINGS: Lower chest: No pleural fluid identified. Scar versus subsegmental atelectasis noted in the medial left base.  Hepatobiliary: The liver appears normal. Previous cholecystectomy. No biliary dilatation.  Pancreas: Negative  Spleen: Negative  Adrenals/Urinary Tract: The adrenal glands are normal. Stone within the upper pole of the right kidney is again noted measuring 1 cm, image 24 of series 2. No hydronephrosis or hydroureter identified. No ureteral calculi noted. The urinary bladder appears normal.  Stomach/Bowel: Large hiatal hernia is present. The small bowel loops have a normal course and caliber. There is no bowel obstruction identified. The appendix is visualized and appears normal. Unremarkable appearance of the proximal colon. Distal colonic diverticula noted without acute inflammation. No pathologic dilatation of the bowel loops noted.  Vascular/Lymphatic: Mild aortic atherosclerotic calcifications noted. No enlarged retroperitoneal or mesenteric adenopathy. No enlarged pelvic or inguinal lymph nodes. There is no pelvic  or inguinal adenopathy noted.  Reproductive: The endometrium appears unusually prominent for a premenopausal female. There is a fibroid noted within the lower uterine segment measuring 2.3 cm.  Other: There is no ascites or focal fluid collections within the abdomen or pelvis.  Musculoskeletal: Degenerative disc disease noted at the L5-S1 level.  IMPRESSION: 1. No acute findings identified within the abdomen or pelvis. 2. Nonobstructing right renal calculus. 3. Large hiatal hernia. 4. The endometrium appears prominent for a postmenopausal female. Consider followup imaging with nonemergent pelvic sonogram.   Electronically Signed By: Kerby Moors M.D. On: 10/15/2015 18:31   Discussed CT with the patient. She reports she will follow-up with her primary care doctor regarding the CAT  scan today as well as in the area that appears thicker around the uterus. She does tell me that she had this evaluated in the past and was told that it was benign, but she will make sure to mention this to her doctor. ____________________________________________   PROCEDURES  Procedure(s) performed: None  Critical Care performed: No  ____________________________________________   INITIAL IMPRESSION / ASSESSMENT AND PLAN / ED COURSE  Pertinent labs & imaging results that were available during my care of the patient were reviewed by me and considered in my medical decision making (see chart for details).  Patient presents for evaluation of left-sided abdominal pain as well as some associated nausea. She also has notable peripheral edema, symmetric in bilateral which her primary care doctor has been evaluating. She denies any cardiopulmonary symptoms. She is awake and alert in no distress. Her abdominal exam is quite reassuring, but given patient's age and complaint with a history of previous kidney stones we will obtain CT imaging to further evaluate. She is afebrile, reassuring labs. Doubt any  sign of acute intra-abdominal process such as perforation, peritonitis, diverticulitis etc.  ----------------------------------------- 6:53 PM on 10/15/2015 -----------------------------------------  Patient awake alert. States she feels well other than her typical symptoms from fibromyalgia including generalized aches which she is treated for chronically. She is not a further symptoms such as vomiting or discomfort in the abdomen. Discussed with the patient, we'll plan to discharge her home to follow-up with her primary care doctor. Patient and family very agreeable. ____________________________________________   FINAL CLINICAL IMPRESSION(S) / ED DIAGNOSES  Final diagnoses:  Pedal edema  Abdominal pain, left lateral      Delman Kitten, MD 10/15/15 [phone]

## 2015-10-19 ENCOUNTER — Other Ambulatory Visit: Payer: Self-pay

## 2015-10-19 NOTE — Telephone Encounter (Signed)
Pt was seen in ED on 10/15/15 and started on lasix 20 mg taking one daily; pt request refill to [facility]; the leg swelling is better but feet are still swollen. No SOB.pt has 30 min appt with Dr Darnell Level on 12/19/15.Please advise.

## 2015-10-20 MED ORDER — FUROSEMIDE 20 MG PO TABS: 20.0000 mg | ORAL_TABLET | Freq: Every day | ORAL | Status: DC | PRN

## 2015-10-21 ENCOUNTER — Ambulatory Visit (INDEPENDENT_AMBULATORY_CARE_PROVIDER_SITE_OTHER): Payer: Medicare PPO | Admitting: Family Medicine

## 2015-10-21 ENCOUNTER — Encounter: Payer: Self-pay | Admitting: Family Medicine

## 2015-10-21 VITALS — BP 130/82 | HR 65 | Temp 97.4°F | Wt 193.5 lb

## 2015-10-21 DIAGNOSIS — R1084 Generalized abdominal pain: Secondary | ICD-10-CM

## 2015-10-21 DIAGNOSIS — R938 Abnormal findings on diagnostic imaging of other specified body structures: Secondary | ICD-10-CM

## 2015-10-21 DIAGNOSIS — E876 Hypokalemia: Secondary | ICD-10-CM

## 2015-10-21 DIAGNOSIS — I700 Atherosclerosis of aorta: Secondary | ICD-10-CM

## 2015-10-21 DIAGNOSIS — N200 Calculus of kidney: Secondary | ICD-10-CM | POA: Diagnosis not present

## 2015-10-21 DIAGNOSIS — R600 Localized edema: Secondary | ICD-10-CM

## 2015-10-21 DIAGNOSIS — R9389 Abnormal findings on diagnostic imaging of other specified body structures: Secondary | ICD-10-CM

## 2015-10-21 LAB — BASIC METABOLIC PANEL
BUN: 14 mg/dL (ref 6–23)
CO2: 30 mEq/L (ref 19–32)
Calcium: 9.6 mg/dL (ref 8.4–10.5)
Chloride: 102 mEq/L (ref 96–112)
Creatinine, Ser: 0.99 mg/dL (ref 0.40–1.20)
GFR: 69.61 mL/min (ref >60.00)
GLUCOSE: 90 mg/dL (ref 70–99)
POTASSIUM: 3.5 meq/L (ref 3.5–5.1)
Sodium: 138 mEq/L (ref 135–145)

## 2015-10-21 MED ORDER — POTASSIUM CHLORIDE ER 10 MEQ PO TBCR: 10.0000 meq | EXTENDED_RELEASE_TABLET | Freq: Every day | ORAL | Status: DC | PRN

## 2015-10-21 NOTE — Assessment & Plan Note (Addendum)
Again reviewed dx with patient. She has restarted lipitor MWF. Check FLP next fasting labwork. Goal LDL <100 but lower better given known ATH disease of abd aorta and CA's.

## 2015-10-21 NOTE — Assessment & Plan Note (Addendum)
Reviewed stone seen on CT scan - nonobstructing. Discussed monitoring for kidney stone pain /R flank pain. Recent UA with blood. Repeat next visit.

## 2015-10-21 NOTE — Progress Notes (Signed)
BP 130/82 mmHg  Pulse 65  Temp(Src) 97.4 F (36.3 C) (Oral)  Wt 193 lb 8 oz (87.771 kg)  SpO2 99%   CC: ER f/u visit  Subjective:    Patient ID: Brianna Woods, female    DOB: 08-15-1935, 81 y.o.   MRN: [id number]  HPI: Brianna Woods is a 81 y.o. female presenting on 10/21/2015 for Hospitalization Follow-up   ER visit f/u. Seen 10/15/2015 at [facility] ER with abd discomfort, pedal edema and abdominal swelling after walking at [facility]. She was also rather confused. Called [facility] - referred to ER. At ER BP elevated at 180s/110s. She also had unrevealing thorough evaluation including CT abd/pelvis no contrast - 1cm nonobstructive R kidney stone, large HH, colonic diverticulosis, mild aortic atherosclerotic calcifications. EKG showed RBBB. labwork unrevealing except for hypokalemia, mild anemia and mildly elevated WBC. UA with hemoglobin and 6-30 RBC/HPF. Discharged home with lasix 20mg  daily. She was also treated with potassium 78mEq daily for 5 days. She feels lasix hasn't really helped swelling or noticed increased UOP on lasix 20mg .   She was also found to have abnormally prominent endometrium with 2.3cm lower uterine segment fibroid. She is on vaginal premarin three times weekly but no systemic HRT. Denies vaginal bleeding. She has seen Dr Lissa Merlin 2013 with normal endometrial biopsy and pap smear.   Lipitor 40mg  was restarted 09/2015. Taking MWF and tolerating well.  Relevant past medical, surgical, family and social history reviewed and updated as indicated. Interim medical history since our last visit reviewed. Allergies and medications reviewed and updated. Current Outpatient Prescriptions on File Prior to Visit  Medication Sig  . albuterol (VENTOLIN HFA) 108 (90 BASE) MCG/ACT inhaler Inhale 2 puffs into the lungs every 6 (six) hours as needed. for wheezing  . amLODipine (NORVASC) 10 MG tablet Take 1 tablet (10 mg total) by mouth daily.  Marland Kitchen atorvastatin (LIPITOR) 40 MG tablet Take 1 tablet  (40 mg total) by mouth every Monday, Wednesday, and Friday.  . cholecalciferol 5000 UNITS TABS Take 5,000 Units by mouth daily.  . cloNIDine (CATAPRES) 0.1 MG tablet Take 1 tablet (0.1 mg total) by mouth 2 (two) times daily as needed (take one daily in am, second if BP >150/100).  Marland Kitchen co-enzyme Q-10 30 MG capsule Take 1 capsule (30 mg total) by mouth daily.  Marland Kitchen conjugated estrogens (PREMARIN) vaginal cream APPLY 1/4 APPLICATORFUL VAGINALLY THREE TIMES A WEEK  . diclofenac sodium (VOLTAREN) 1 % GEL APPLY 4 GRAMS TOPICALLY FOUR TIMES DAILY AS NEEDED.  Marland Kitchen esomeprazole (NEXIUM) 40 MG capsule Take 1 capsule (40 mg total) by mouth daily.  . ferrous sulfate 325 (65 FE) MG tablet Take 1 tablet (325 mg total) by mouth daily with breakfast.  . furosemide (LASIX) 20 MG tablet Take 1 tablet (20 mg total) by mouth daily as needed for edema.  . gabapentin (NEURONTIN) 300 MG capsule TAKE 1 CAPSULE THREE TIMES DAILY  . metoprolol tartrate (LOPRESSOR) 25 MG tablet TAKE 1 TABLET TWICE DAILY  . nystatin (MYCOSTATIN/NYSTOP) 100000 UNIT/GM POWD AAA BID  . ondansetron (ZOFRAN-ODT) 4 MG disintegrating tablet Take 1 tablet (4 mg total) by mouth 2 (two) times daily as needed for nausea or vomiting.  Marland Kitchen oxybutynin (DITROPAN-XL) 10 MG 24 hr tablet TAKE 1 TABLET EVERY DAY  . ranitidine (ZANTAC) 300 MG tablet Take 1 tablet (300 mg total) by mouth at bedtime.  . sucralfate (CARAFATE) 1 G tablet Take 1 tablet (1 g total) by mouth 2 (two) times daily.  . traMADol-acetaminophen (  ULTRACET) 37.5-325 MG tablet Take 1 tablet by mouth every 8 (eight) hours as needed. for pain  . traZODone (DESYREL) 100 MG tablet TAKE 1/2 TO 1 TABLET AT BEDTIME  . vitamin C (ASCORBIC ACID) 500 MG tablet Take 500 mg by mouth daily.  . [DISCONTINUED] pirbuterol (MAXAIR) 200 MCG/INH inhaler Inhale 2 puffs into the lungs 4 (four) times daily.   No current facility-administered medications on file prior to visit.    Review of Systems Per HPI unless  specifically indicated in ROS section     Objective:    BP 130/82 mmHg  Pulse 65  Temp(Src) 97.4 F (36.3 C) (Oral)  Wt 193 lb 8 oz (87.771 kg)  SpO2 99%  Wt Readings from Last 3 Encounters:  10/21/15 193 lb 8 oz (87.771 kg)  10/15/15 190 lb (86.183 kg)  09/22/15 190 lb (86.183 kg)   Body mass index is 37.79 kg/(m^2).  Physical Exam  Constitutional: She appears well-developed and well-nourished. No distress.  Ambulates with rollator  Cardiovascular: Normal rate, regular rhythm, normal heart sounds and intact distal pulses.   No murmur heard. Pulmonary/Chest: Effort normal and breath sounds normal. No respiratory distress. She has no wheezes. She has no rales.  Abdominal: Soft. Bowel sounds are normal. She exhibits no distension and no mass. There is no hepatosplenomegaly. There is generalized tenderness (mild). There is no rigidity, no rebound, no guarding, no CVA tenderness and negative Murphy's sign.  Musculoskeletal: She exhibits edema (nonpitting bilaterally).  Skin: Skin is warm and dry. No rash noted.  Psychiatric: She has a normal mood and affect.  Nursing note and vitals reviewed.  Results for orders placed or performed during the hospital encounter of 10/15/15  Lipase, blood  Result Value Ref Range   Lipase 17 11 - 51 U/L  Comprehensive metabolic panel  Result Value Ref Range   Sodium 140 135 - 145 mmol/L   Potassium 3.2 (L) 3.5 - 5.1 mmol/L   Chloride 104 101 - 111 mmol/L   CO2 28 22 - 32 mmol/L   Glucose, Bld 99 65 - 99 mg/dL   BUN 10 6 - 20 mg/dL   Creatinine, Ser 0.93 0.44 - 1.00 mg/dL   Calcium 9.2 8.9 - 10.3 mg/dL   Total Protein 7.6 6.5 - 8.1 g/dL   Albumin 3.8 3.5 - 5.0 g/dL   AST 17 15 - 41 U/L   ALT 11 (L) 14 - 54 U/L   Alkaline Phosphatase 81 38 - 126 U/L   Total Bilirubin 0.5 0.3 - 1.2 mg/dL   GFR calc non Af Amer 57 (L) >60 mL/min   GFR calc Af Amer >60 >60 mL/min   Anion gap 8 5 - 15  CBC  Result Value Ref Range   WBC 11.6 (H) 3.6 - 11.0  K/uL   RBC 4.07 3.80 - 5.20 MIL/uL   Hemoglobin 11.9 (L) 12.0 - 16.0 g/dL   HCT 36.2 35.0 - 47.0 %   MCV 88.9 80.0 - 100.0 fL   MCH 29.3 26.0 - 34.0 pg   MCHC 33.0 32.0 - 36.0 g/dL   RDW 13.7 11.5 - 14.5 %   Platelets 214 150 - 440 K/uL  Urinalysis complete, with microscopic ([facility] only)  Result Value Ref Range   Color, Urine YELLOW (A) YELLOW   APPearance CLEAR (A) CLEAR   Glucose, UA NEGATIVE NEGATIVE mg/dL   Bilirubin Urine NEGATIVE NEGATIVE   Ketones, ur NEGATIVE NEGATIVE mg/dL   Specific Gravity, Urine 1.024 1.005 - 1.030  Hgb urine dipstick 1+ (A) NEGATIVE   pH 5.0 5.0 - 8.0   Protein, ur NEGATIVE NEGATIVE mg/dL   Nitrite NEGATIVE NEGATIVE   Leukocytes, UA NEGATIVE NEGATIVE   RBC / HPF 6-30 0 - 5 RBC/hpf   WBC, UA 0-5 0 - 5 WBC/hpf   Bacteria, UA NONE SEEN NONE SEEN   Squamous Epithelial / LPF 0-5 (A) NONE SEEN   Mucous PRESENT    Hyaline Casts, UA PRESENT   Troponin I  Result Value Ref Range   Troponin I 0.03 <0.031 ng/mL      Assessment & Plan:   Problem List Items Addressed This Visit    Pedal edema    Intermittent trouble. Overall stable on exam today. Possibly better with lasix 20mg  prn. Continue this. Discussed lasix and potassium use. Check BMP today.      Nephrolithiasis    Reviewed stone seen on CT scan - nonobstructing. Discussed monitoring for kidney stone pain /R flank pain. Recent UA with blood. Repeat next visit.      Increased endometrial stripe thickness    Recheck pelvic US. Pt states had normal endometrial biopsy 2013 by Dr Yong Channel at [facility]. May refer back to her if abnormally thick stripe.       Relevant Orders   US Pelvis Complete   US Transvaginal Non-OB   Generalized abdominal pain - Primary    CT scan reassuring at ER. Now abd pain has resolved.       Atherosclerosis of abdominal aorta ([place])    Again reviewed dx with patient. She has restarted lipitor MWF. Check FLP next fasting labwork. Goal LDL <100 but lower better given known ATH  disease of abd aorta and CA's.        Other Visit Diagnoses    Hypokalemia        Relevant Orders    Basic metabolic panel        Follow up plan: Return if symptoms worsen or fail to improve.

## 2015-10-21 NOTE — Assessment & Plan Note (Signed)
Recheck pelvic US. Pt states had normal endometrial biopsy 2013 by Dr Yong Channel at [facility]. May refer back to her if abnormally thick stripe.

## 2015-10-21 NOTE — Patient Instructions (Addendum)
We will order pelvic ultrasound.  Continue lasix 20mg  daily as needed for swelling. When you take lasix, also take potassium pill (Kdur).  Recheck labs today. Keep next appointment. Return sooner as needed

## 2015-10-21 NOTE — Assessment & Plan Note (Signed)
Intermittent trouble. Overall stable on exam today. Possibly better with lasix 20mg  prn. Continue this. Discussed lasix and potassium use. Check BMP today.

## 2015-10-21 NOTE — Assessment & Plan Note (Signed)
CT scan reassuring at ER. Now abd pain has resolved.

## 2015-10-21 NOTE — Progress Notes (Signed)
Pre visit review using our clinic review tool, if applicable. No additional management support is needed unless otherwise documented below in the visit note. 

## 2015-10-22 ENCOUNTER — Ambulatory Visit: Payer: Medicare PPO | Admitting: Family Medicine

## 2015-10-25 ENCOUNTER — Encounter: Payer: Self-pay | Admitting: *Deleted

## 2015-10-29 ENCOUNTER — Ambulatory Visit
Admission: RE | Admit: 2015-10-29 | Discharge: 2015-10-29 | Disposition: A | Discharge status: Home / Self Care | Payer: Medicare PPO | Source: Ambulatory Visit | Attending: Family Medicine | Admitting: Family Medicine

## 2015-10-29 DIAGNOSIS — R938 Abnormal findings on diagnostic imaging of other specified body structures: Secondary | ICD-10-CM | POA: Diagnosis not present

## 2015-10-29 DIAGNOSIS — R9389 Abnormal findings on diagnostic imaging of other specified body structures: Secondary | ICD-10-CM

## 2015-10-30 ENCOUNTER — Other Ambulatory Visit: Payer: Self-pay | Admitting: Family Medicine

## 2015-10-30 DIAGNOSIS — R9389 Abnormal findings on diagnostic imaging of other specified body structures: Secondary | ICD-10-CM

## 2015-10-30 DIAGNOSIS — D259 Leiomyoma of uterus, unspecified: Secondary | ICD-10-CM

## 2015-11-05 ENCOUNTER — Ambulatory Visit
Admission: RE | Admit: 2015-11-05 | Discharge: 2015-11-05 | Disposition: A | Discharge status: Home / Self Care | Payer: Medicare PPO | Source: Ambulatory Visit | Attending: Family Medicine | Admitting: Family Medicine

## 2015-11-05 DIAGNOSIS — N858 Other specified noninflammatory disorders of uterus: Secondary | ICD-10-CM | POA: Diagnosis not present

## 2015-11-05 DIAGNOSIS — R938 Abnormal findings on diagnostic imaging of other specified body structures: Secondary | ICD-10-CM | POA: Diagnosis not present

## 2015-11-05 DIAGNOSIS — R9389 Abnormal findings on diagnostic imaging of other specified body structures: Secondary | ICD-10-CM

## 2015-11-05 DIAGNOSIS — D259 Leiomyoma of uterus, unspecified: Secondary | ICD-10-CM | POA: Diagnosis not present

## 2015-11-06 ENCOUNTER — Other Ambulatory Visit: Payer: Self-pay | Admitting: Family Medicine

## 2015-11-06 DIAGNOSIS — D259 Leiomyoma of uterus, unspecified: Secondary | ICD-10-CM

## 2015-11-06 DIAGNOSIS — D509 Iron deficiency anemia, unspecified: Secondary | ICD-10-CM

## 2015-11-06 DIAGNOSIS — R9389 Abnormal findings on diagnostic imaging of other specified body structures: Secondary | ICD-10-CM

## 2015-11-19 ENCOUNTER — Other Ambulatory Visit: Payer: Self-pay | Admitting: *Deleted

## 2015-11-19 MED ORDER — FUROSEMIDE 20 MG PO TABS: 20.0000 mg | ORAL_TABLET | Freq: Every day | ORAL | Status: DC | PRN

## 2015-11-19 MED ORDER — POTASSIUM CHLORIDE ER 10 MEQ PO TBCR: 10.0000 meq | EXTENDED_RELEASE_TABLET | Freq: Every day | ORAL | Status: DC | PRN

## 2015-11-26 ENCOUNTER — Other Ambulatory Visit: Payer: Self-pay | Admitting: *Deleted

## 2015-11-30 ENCOUNTER — Other Ambulatory Visit: Payer: Self-pay | Admitting: Family Medicine

## 2015-12-06 ENCOUNTER — Emergency Department
Admission: EM | Admit: 2015-12-06 | Discharge: 2015-12-06 | Disposition: A | Discharge status: Home / Self Care | Payer: Medicare PPO | Attending: Emergency Medicine | Admitting: Emergency Medicine

## 2015-12-06 ENCOUNTER — Emergency Department: Payer: Medicare PPO

## 2015-12-06 DIAGNOSIS — J069 Acute upper respiratory infection, unspecified: Secondary | ICD-10-CM | POA: Diagnosis not present

## 2015-12-06 DIAGNOSIS — I2510 Atherosclerotic heart disease of native coronary artery without angina pectoris: Secondary | ICD-10-CM | POA: Insufficient documentation

## 2015-12-06 DIAGNOSIS — R0789 Other chest pain: Secondary | ICD-10-CM

## 2015-12-06 DIAGNOSIS — I10 Essential (primary) hypertension: Secondary | ICD-10-CM | POA: Diagnosis not present

## 2015-12-06 DIAGNOSIS — Z880 Allergy status to penicillin: Secondary | ICD-10-CM | POA: Diagnosis not present

## 2015-12-06 DIAGNOSIS — B9789 Other viral agents as the cause of diseases classified elsewhere: Secondary | ICD-10-CM

## 2015-12-06 DIAGNOSIS — J45909 Unspecified asthma, uncomplicated: Secondary | ICD-10-CM | POA: Diagnosis not present

## 2015-12-06 DIAGNOSIS — Z79899 Other long term (current) drug therapy: Secondary | ICD-10-CM | POA: Insufficient documentation

## 2015-12-06 DIAGNOSIS — R079 Chest pain, unspecified: Secondary | ICD-10-CM | POA: Diagnosis present

## 2015-12-06 DIAGNOSIS — J988 Other specified respiratory disorders: Secondary | ICD-10-CM | POA: Diagnosis not present

## 2015-12-06 DIAGNOSIS — B349 Viral infection, unspecified: Secondary | ICD-10-CM | POA: Diagnosis not present

## 2015-12-06 DIAGNOSIS — R05 Cough: Secondary | ICD-10-CM | POA: Diagnosis not present

## 2015-12-06 LAB — CBC
HCT: 35.9 % (ref 35.0–47.0)
Hemoglobin: 12 g/dL (ref 12.0–16.0)
MCH: 30.5 pg (ref 26.0–34.0)
MCHC: 33.5 g/dL (ref 32.0–36.0)
MCV: 91 fL (ref 80.0–100.0)
Platelets: 211 10*3/uL (ref 150–440)
RBC: 3.95 MIL/uL (ref 3.80–5.20)
RDW: 13.6 % (ref 11.5–14.5)
WBC: 6.5 10*3/uL (ref 3.6–11.0)

## 2015-12-06 LAB — BASIC METABOLIC PANEL
ANION GAP: 7 (ref 5–15)
BUN: 10 mg/dL (ref 6–20)
CALCIUM: 9.4 mg/dL (ref 8.9–10.3)
CO2: 26 mmol/L (ref 22–32)
Chloride: 106 mmol/L (ref 101–111)
Creatinine, Ser: 0.94 mg/dL (ref 0.44–1.00)
GFR, EST NON AFRICAN AMERICAN: 57 mL/min — AB (ref >60)
GLUCOSE: 119 mg/dL — AB (ref 65–99)
Potassium: 3.5 mmol/L (ref 3.5–5.1)
SODIUM: 139 mmol/L (ref 135–145)

## 2015-12-06 LAB — RAPID INFLUENZA A&B ANTIGENS: Influenza B (ARMC): NEGATIVE

## 2015-12-06 LAB — TROPONIN I

## 2015-12-06 LAB — RAPID INFLUENZA A&B ANTIGENS (ARMC ONLY): INFLUENZA A (ARMC): NEGATIVE

## 2015-12-06 IMAGING — CR DG CHEST 2V
1 series · 2 of 2 positions shown · non-contrast
Comparison: Chest radiograph performed [DATE]

CLINICAL DATA: Acute onset of generalized chest pain. Shortness of
breath and cough. Initial encounter.

EXAM:
CHEST  2 VIEW

[Series 1: dg chest 2 view · 0.14mm/px · 2 of 2 slices shown]
[im 1/2]
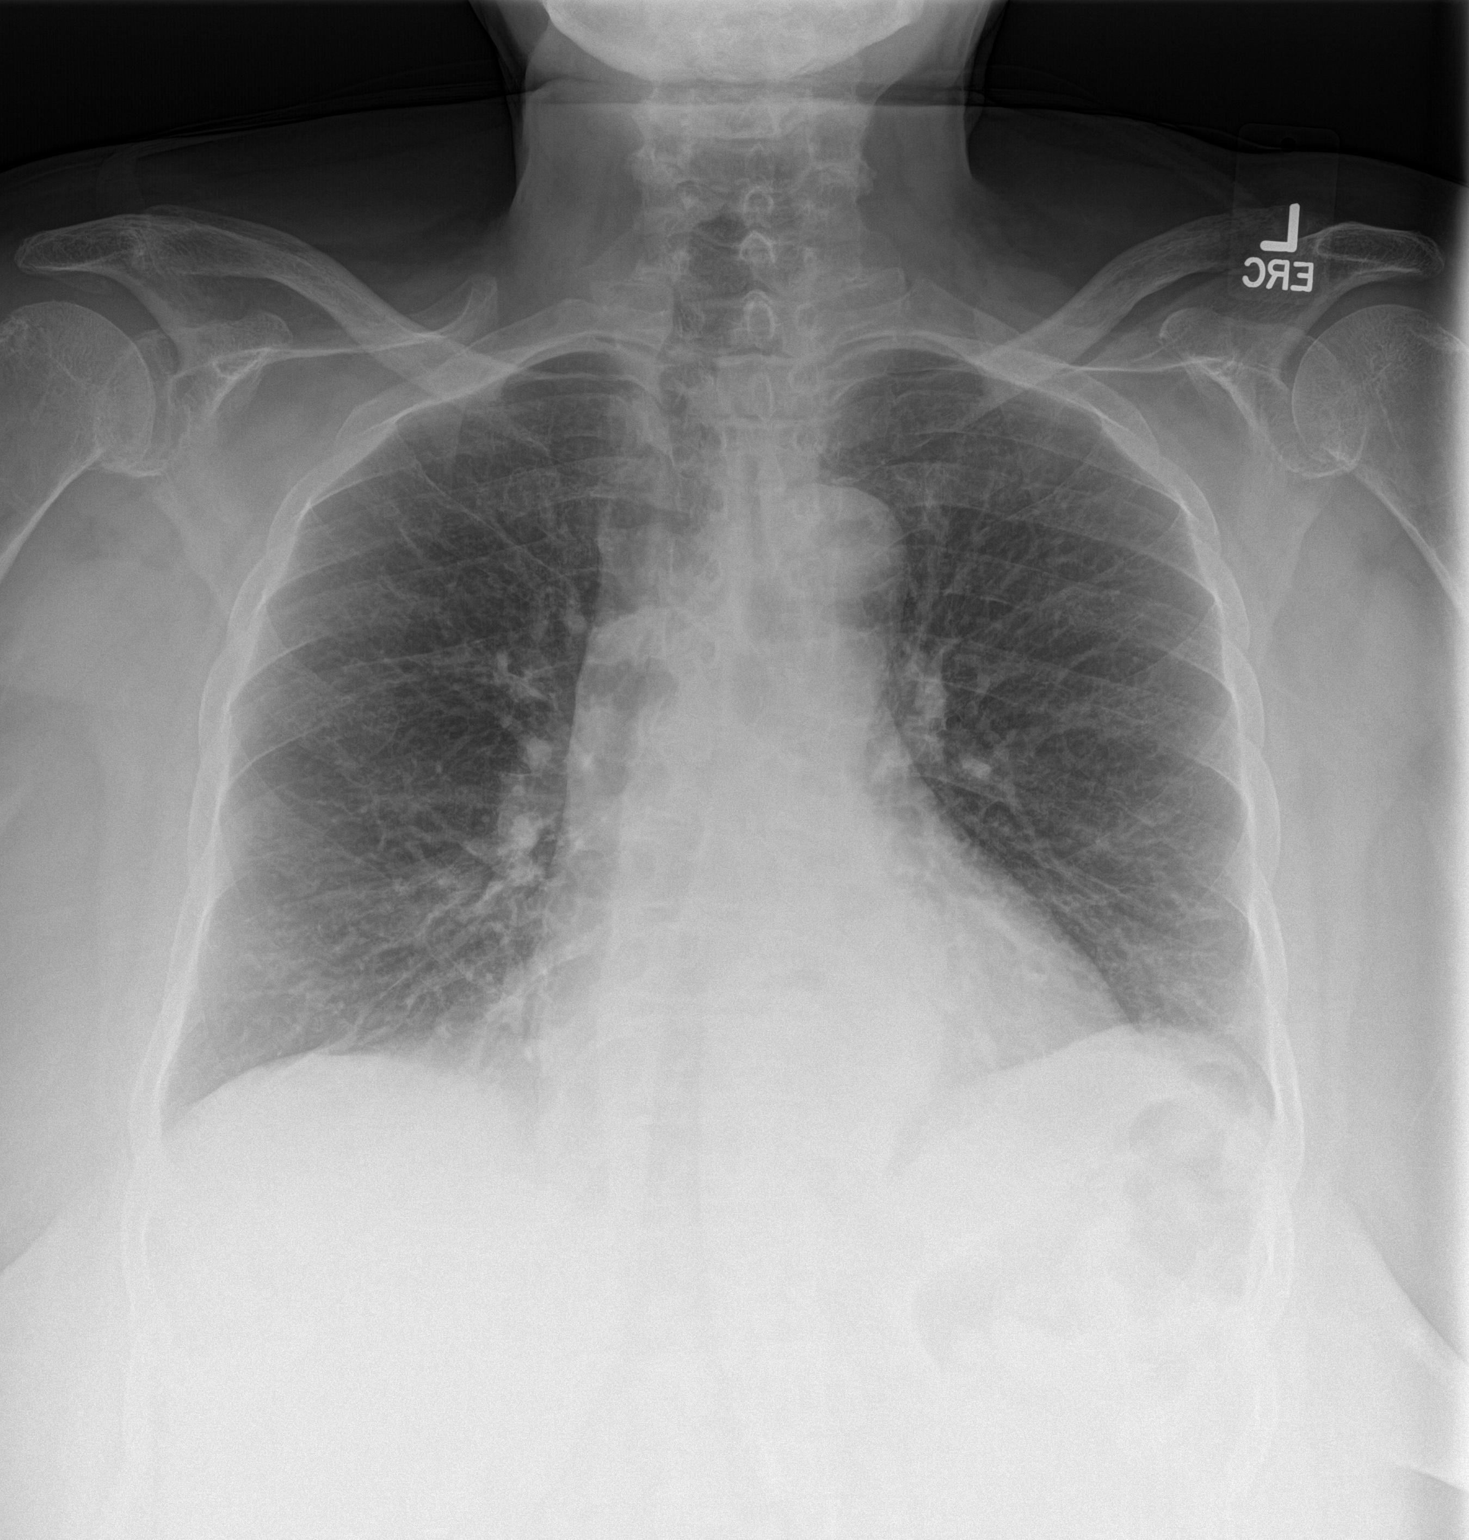
[im 2/2]
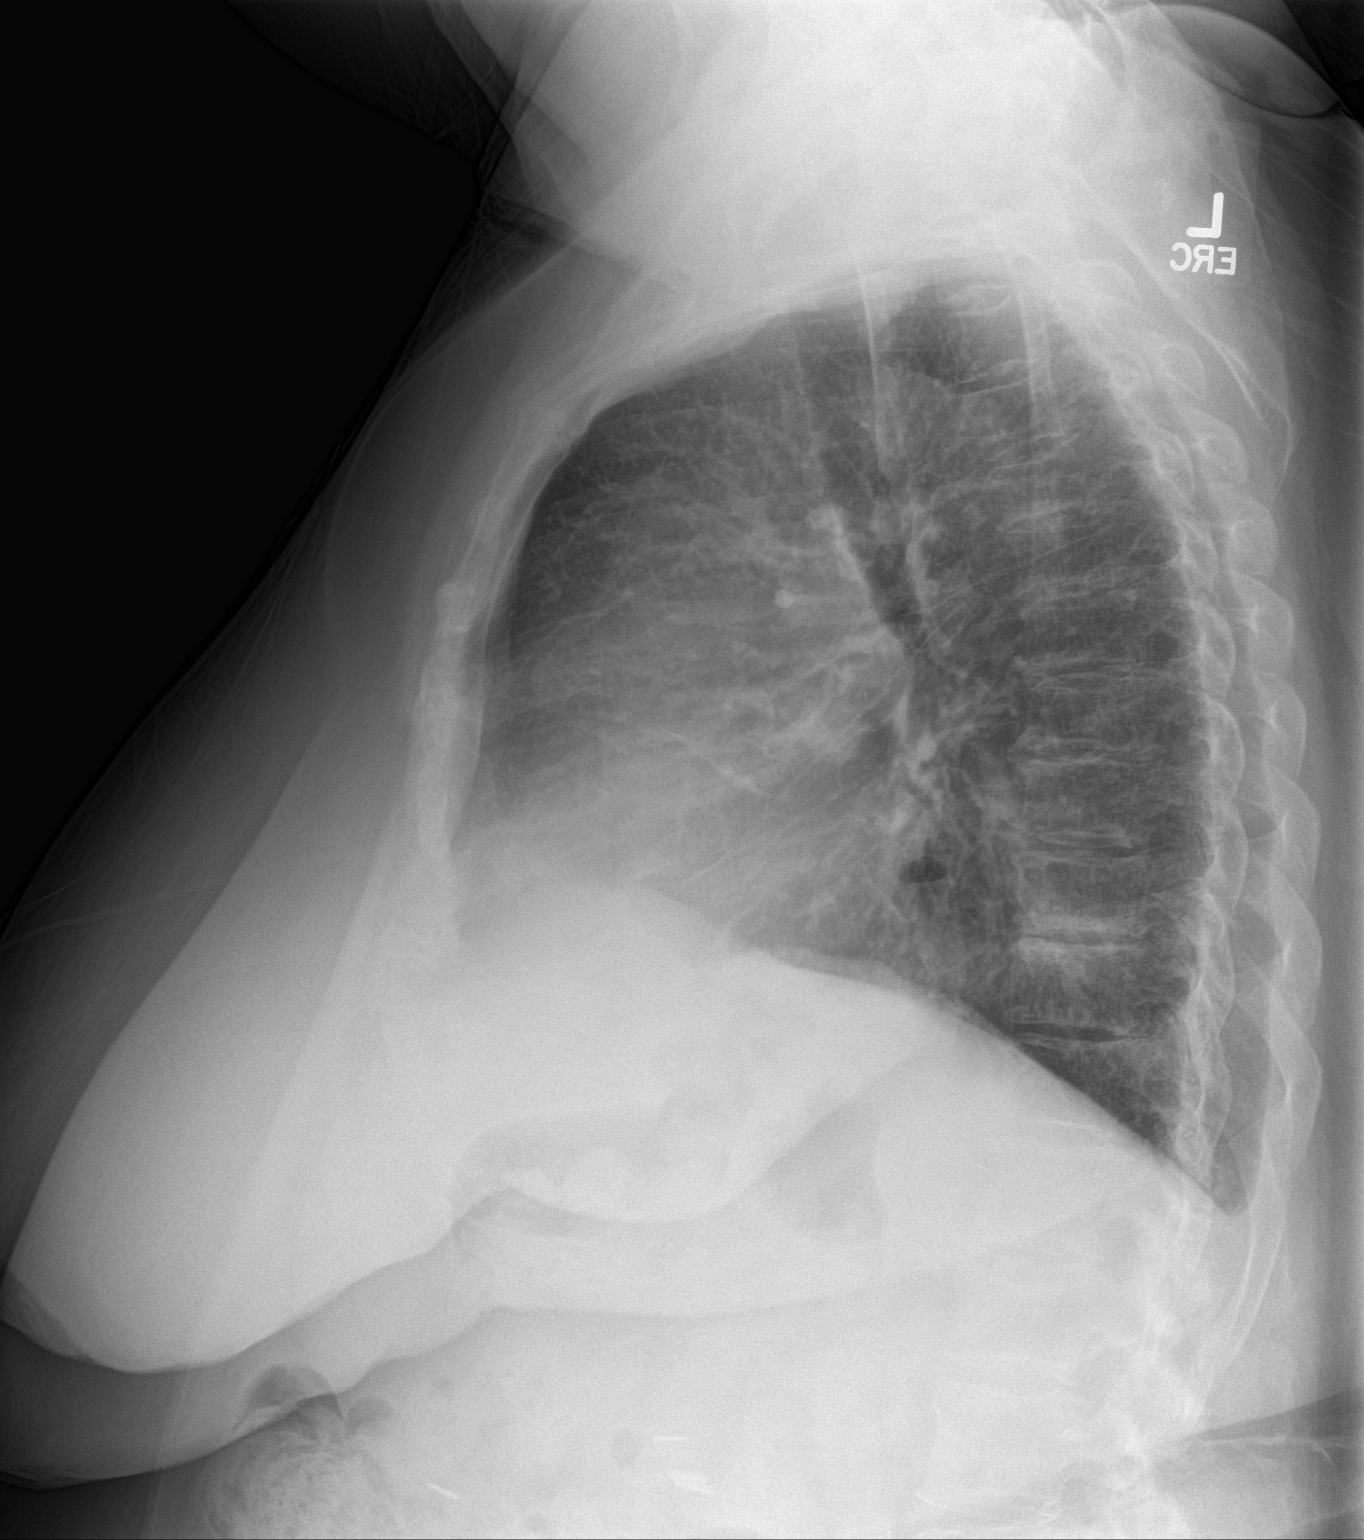

[2 of 2 positions shown; findings below may reference images not displayed]

FINDINGS: The lungs are well-aerated. Mild bibasilar atelectasis is noted.
There is no evidence of pleural effusion or pneumothorax.

The heart is normal in size; the mediastinal contour is within
normal limits. No acute osseous abnormalities are seen. A small to
moderate hiatal hernia is noted. Clips are noted within the right
upper quadrant, reflecting prior cholecystectomy.
IMPRESSION: 1. Mild bibasilar atelectasis noted.  Lungs otherwise clear.
2. Small to moderate hiatal hernia seen.

## 2015-12-06 MED ORDER — PREDNISONE 20 MG PO TABS: ORAL_TABLET | ORAL | Status: DC

## 2015-12-06 MED ORDER — KETOROLAC TROMETHAMINE 30 MG/ML IJ SOLN
30.0000 mg | Freq: Once | INTRAMUSCULAR | Status: AC
  Administered 2015-12-06: 30 mg via INTRAMUSCULAR
  Filled 2015-12-06: qty 1

## 2015-12-06 MED ORDER — HYDROCOD POLST-CPM POLST ER 10-8 MG/5ML PO SUER: 5.0000 mL | Freq: Two times a day (BID) | ORAL | Status: DC

## 2015-12-06 MED ORDER — HYDROCOD POLST-CPM POLST ER 10-8 MG/5ML PO SUER
5.0000 mL | Freq: Once | ORAL | Status: AC
  Administered 2015-12-06: 5 mL via ORAL
  Filled 2015-12-06: qty 5

## 2015-12-06 NOTE — ED Notes (Signed)
Lights dimmed for comfort.

## 2015-12-06 NOTE — Discharge Instructions (Signed)
1. Take steroid as prescribed (prednisone 60 mg daily for 5 days). 2. You may take cough medicine as needed (Tussionex). 3. Return to the ER for worsening symptoms, persistent vomiting, difficult breathing or other concerns.  Chest Wall Pain Chest wall pain is pain in or around the bones and muscles of your chest. Sometimes, an injury causes this pain. Sometimes, the cause may not be known. This pain may take several weeks or longer to get better. HOME CARE INSTRUCTIONS  Pay attention to any changes in your symptoms. Take these actions to help with your pain:   Rest as told by your health care provider.   Avoid activities that cause pain. These include any activities that use your chest muscles or your abdominal and side muscles to lift heavy items.   If directed, apply ice to the painful area:  Put ice in a plastic bag.  Place a towel between your skin and the bag.  Leave the ice on for 20 minutes, 2-3 times per day.  Take over-the-counter and prescription medicines only as told by your health care provider.  Do not use tobacco products, including cigarettes, chewing tobacco, and e-cigarettes. If you need help quitting, ask your health care provider.  Keep all follow-up visits as told by your health care provider. This is important. SEEK MEDICAL CARE IF:  You have a fever.  Your chest pain becomes worse.  You have new symptoms. SEEK IMMEDIATE MEDICAL CARE IF:  You have nausea or vomiting.  You feel sweaty or light-headed.  You have a cough with phlegm (sputum) or you cough up blood.  You develop shortness of breath.   This information is not intended to replace advice given to you by your health care provider. Make sure you discuss any questions you have with your health care provider.   Document Released: 08/28/2005 Document Revised: 05/19/2015 Document Reviewed: 11/23/2014 Elsevier Interactive Patient Education 2016 Elsevier Inc[employer]  Nonspecific Chest Pain  Chest  pain can be caused by many different conditions. There is always a chance that your pain could be related to something serious, such as a heart attack or a blood clot in your lungs. Chest pain can also be caused by conditions that are not life-threatening. If you have chest pain, it is very important to follow up with your health care provider. CAUSES  Chest pain can be caused by:  Heartburn.  Pneumonia or bronchitis.  Anxiety or stress.  Inflammation around your heart (pericarditis) or lung (pleuritis or pleurisy).  A blood clot in your lung.  A collapsed lung (pneumothorax). It can develop suddenly on its own (spontaneous pneumothorax) or from trauma to the chest.  Shingles infection (varicella-zoster virus).  Heart attack.  Damage to the bones, muscles, and cartilage that make up your chest wall. This can include:  Bruised bones due to injury.  Strained muscles or cartilage due to frequent or repeated coughing or overwork.  Fracture to one or more ribs.  Sore cartilage due to inflammation (costochondritis). RISK FACTORS  Risk factors for chest pain may include:  Activities that increase your risk for trauma or injury to your chest.  Respiratory infections or conditions that cause frequent coughing.  Medical conditions or overeating that can cause heartburn.  Heart disease or family history of heart disease.  Conditions or health behaviors that increase your risk of developing a blood clot.  Having had chicken pox (varicella zoster). SIGNS AND SYMPTOMS Chest pain can feel like:  Burning or tingling on the surface  of your chest or deep in your chest.  Crushing, pressure, aching, or squeezing pain.  Dull or sharp pain that is worse when you move, cough, or take a deep breath.  Pain that is also felt in your back, neck, shoulder, or arm, or pain that spreads to any of these areas. Your chest pain may come and go, or it may stay constant. DIAGNOSIS Lab tests or  other studies may be needed to find the cause of your pain. Your health care provider may have you take a test called an ambulatory ECG (electrocardiogram). An ECG records your heartbeat patterns at the time the test is performed. You may also have other tests, such as:  Transthoracic echocardiogram (TTE). During echocardiography, sound waves are used to create a picture of all of the heart structures and to look at how blood flows through your heart.  Transesophageal echocardiogram (TEE).This is a more advanced imaging test that obtains images from inside your body. It allows your health care provider to see your heart in finer detail.  Cardiac monitoring. This allows your health care provider to monitor your heart rate and rhythm in real time.  Holter monitor. This is a portable device that records your heartbeat and can help to diagnose abnormal heartbeats. It allows your health care provider to track your heart activity for several days, if needed.  Stress tests. These can be done through exercise or by taking medicine that makes your heart beat more quickly.  Blood tests.  Imaging tests. TREATMENT  Your treatment depends on what is causing your chest pain. Treatment may include:  Medicines. These may include:  Acid blockers for heartburn.  Anti-inflammatory medicine.  Pain medicine for inflammatory conditions.  Antibiotic medicine, if an infection is present.  Medicines to dissolve blood clots.  Medicines to treat coronary artery disease.  Supportive care for conditions that do not require medicines. This may include:  Resting.  Applying heat or cold packs to injured areas.  Limiting activities until pain decreases. HOME CARE INSTRUCTIONS  If you were prescribed an antibiotic medicine, finish it all even if you start to feel better.  Avoid any activities that bring on chest pain.  Do not use any tobacco products, including cigarettes, chewing tobacco, or electronic  cigarettes. If you need help quitting, ask your health care provider.  Do not drink alcohol.  Take medicines only as directed by your health care provider.  Keep all follow-up visits as directed by your health care provider. This is important. This includes any further testing if your chest pain does not go away.  If heartburn is the cause for your chest pain, you may be told to keep your head raised (elevated) while sleeping. This reduces the chance that acid will go from your stomach into your esophagus.  Make lifestyle changes as directed by your health care provider. These may include:  Getting regular exercise. Ask your health care provider to suggest some activities that are safe for you.  Eating a heart-healthy diet. A registered dietitian can help you to learn healthy eating options.  Maintaining a healthy weight.  Managing diabetes, if necessary.  Reducing stress. SEEK MEDICAL CARE IF:  Your chest pain does not go away after treatment.  You have a rash with blisters on your chest.  You have a fever. SEEK IMMEDIATE MEDICAL CARE IF:   Your chest pain is worse.  You have an increasing cough, or you cough up blood.  You have severe abdominal pain.  You  have severe weakness.  You faint.  You have chills.  You have sudden, unexplained chest discomfort.  You have sudden, unexplained discomfort in your arms, back, neck, or jaw.  You have shortness of breath at any time.  You suddenly start to sweat, or your skin gets clammy.  You feel nauseous or you vomit.  You suddenly feel light-headed or dizzy.  Your heart begins to beat quickly, or it feels like it is skipping beats. These symptoms may represent a serious problem that is an emergency. Do not wait to see if the symptoms will go away. Get medical help right away. Call your local emergency services (911 in the [place] Do not drive yourself to the hospital.   This information is not intended to replace advice  given to you by your health care provider. Make sure you discuss any questions you have with your health care provider.   Document Released: 06/07/2005 Document Revised: 09/18/2014 Document Reviewed: 04/03/2014 Elsevier Interactive Patient Education 2016 Elsevier Inc.  Viral Infections A viral infection can be caused by different types of viruses.Most viral infections are not serious and resolve on their own. However, some infections may cause severe symptoms and may lead to further complications. SYMPTOMS Viruses can frequently cause:  Minor sore throat.  Aches and pains.  Headaches.  Runny nose.  Different types of rashes.  Watery eyes.  Tiredness.  Cough.  Loss of appetite.  Gastrointestinal infections, resulting in nausea, vomiting, and diarrhea. These symptoms do not respond to antibiotics because the infection is not caused by bacteria. However, you might catch a bacterial infection following the viral infection. This is sometimes called a "superinfection." Symptoms of such a bacterial infection may include:  Worsening sore throat with pus and difficulty swallowing.  Swollen neck glands.  Chills and a high or persistent fever.  Severe headache.  Tenderness over the sinuses.  Persistent overall ill feeling (malaise), muscle aches, and tiredness (fatigue).  Persistent cough.  Yellow, green, or brown mucus production with coughing. HOME CARE INSTRUCTIONS   Only take over-the-counter or prescription medicines for pain, discomfort, diarrhea, or fever as directed by your caregiver.  Drink enough water and fluids to keep your urine clear or pale yellow. Sports drinks can provide valuable electrolytes, sugars, and hydration.  Get plenty of rest and maintain proper nutrition. Soups and broths with crackers or rice are fine. SEEK IMMEDIATE MEDICAL CARE IF:   You have severe headaches, shortness of breath, chest pain, neck pain, or an unusual rash.  You have  uncontrolled vomiting, diarrhea, or you are unable to keep down fluids.  You or your child has an oral temperature above 102 F (38.9 C), not controlled by medicine.  Your baby is older than 3 months with a rectal temperature of 102 F (38.9 C) or higher.  Your baby is 35 months old or younger with a rectal temperature of 100.4 F (38 C) or higher. MAKE SURE YOU:   Understand these instructions.  Will watch your condition.  Will get help right away if you are not doing well or get worse.   This information is not intended to replace advice given to you by your health care provider. Make sure you discuss any questions you have with your health care provider.   Document Released: 06/07/2005 Document Revised: 11/20/2011 Document Reviewed: 02/03/2015 Elsevier Interactive Patient Education [employer].

## 2015-12-06 NOTE — ED Notes (Signed)
Pt c/o sharp pain to the center of her chest since about 1pm Sunday; productive cough brown sputum; pt talking in complete coherent sentences

## 2015-12-06 NOTE — ED Notes (Signed)
Warm blankets and po fluids provided to pt's visitor. Pt sleeping at this time with unlabored resps.

## 2015-12-06 NOTE — ED Notes (Signed)
Pt and visitor sleeping in room.

## 2015-12-06 NOTE — ED Provider Notes (Signed)
[facility] Emergency Department Provider Note  ____________________________________________  Time seen: Approximately 5:08 AM  I have reviewed the triage vital signs and the nursing notes.   HISTORY  Chief Complaint Chest Pain; Cough; and Generalized Body Aches    HPI Brianna Woods is a 81 y.o. female and so the ED from home with a chief complaint of cough, myalgias and chest pain. Patient reports onset of symptoms since approximately 1 PM yesterday. Exposed to person with known influenza. Patient complains of productive cough, occasional wheezing, body aches and sharp pain to center of her chest. Chest pain has been constant, nonradiating, and not associated with diaphoresis, shortness of breath, nausea, vomiting or palpitations. Denies fever, chills, recent travel or trauma. Nothing makes her symptoms better or worse.    Past Medical History  Diagnosis Date  . HLD (hyperlipidemia)   . HTN (hypertension)   . Fibromyalgia     per pt, no records of this   . History of ulcer disease     per pt, no records of this  . Nephrolithiasis     s/p surgery R kidney (81mm) 11/2009  . Mixed incontinence     on ditropan  . GERD (gastroesophageal reflux disease)     and esoph stricture s/p dilation 2008 HH by CT 2016  . Barrett's esophagus     on EGD 2008, EGD WNL 2013  . Nodular goiter, toxic or with hyperthyroidism     h/o toxic, on methimazole, known large left thyroid nodule 08/2013 s/p beign biopsy per patient 2010, saw Dr. Gabriel Carina, Rad I ablation 04/2014  . Asthma     per pt  . History of CVA (cerebrovascular accident) 2008    "I've had several TIAs"  . Anemia   . History of chicken pox   . History of cardiac murmur   . Vitamin D deficiency   . Arthritis   . History of right bundle branch block 2011  . History of anxiety     was on prozac then effexor (pt denies h/o anxiety/depression)  . Diverticulosis 2014    sigmoid on colonoscopy  . Kidney cyst, acquired  07/2015    by CT, rec rpt renal MRI in 6 months  . CAD (coronary artery disease) 07/2015    of LAD by CT  . Atherosclerosis of abdominal aorta ([place]) 07/2015    by CT  . Fatty pancreas ([place]) 07/2015    by CT    Patient Active Problem List   Diagnosis Date Noted  . Increased endometrial stripe thickness 10/21/2015  . Nephrolithiasis   . Pedal edema 09/25/2015  . Generalized abdominal pain 07/26/2015  . Kidney cyst, acquired 07/13/2015  . CAD (coronary artery disease) 07/13/2015  . Atherosclerosis of abdominal aorta ([place]) 07/13/2015  . Fatty pancreas ([place]) 07/13/2015  . Health maintenance examination 03/22/2015  . Intertrigo 03/22/2015  . Advanced care planning/counseling discussion 09/21/2014  . Medicare annual wellness visit, subsequent 03/20/2014  . Urinary incontinence 03/09/2014  . Chest discomfort 08/15/2013  . IDA (iron deficiency anemia) 04/01/2013  . Anxiety attack 05/18/2012  . History of right bundle branch block   . Vitamin D deficiency 02/10/2012  . Dizziness 01/22/2012  . Fibromyalgia   . History of CVA (cerebrovascular accident)   . Asthma   . Nodular goiter, toxic or with hyperthyroidism   . GERD (gastroesophageal reflux disease)   . Mixed incontinence   . HTN (hypertension)   . HLD (hyperlipidemia)     Past Surgical History  Procedure Laterality Date  . Cholecystectomy  2006  . Cataract extraction  1990    w/ implants  . Lithotripsy Right 2011  . Esophagogastroduodenoscopy  11/2011    LA grade A esophagitis lower 1/3, dilated, med HH, o/w WNL - path: + GERD, no barrett's - f/u 11/2016  . Dexa      no records received  . Colonoscopy  10/02/12    diverticulosis, o/w WNL ([place])  . Thyroid uptake scan  04/2014    unifrm uptake, enlarged thyroid consistent with grave's dz    Current Outpatient Rx  Name  Route  Sig  Dispense  Refill  . albuterol (VENTOLIN HFA) 108 (90 BASE) MCG/ACT inhaler   Inhalation   Inhale 2 puffs into the lungs every 6 (six) hours  as needed. for wheezing   54 g   3   . amLODipine (NORVASC) 10 MG tablet   Oral   Take 1 tablet (10 mg total) by mouth daily.   90 tablet   3   . atorvastatin (LIPITOR) 40 MG tablet   Oral   Take 1 tablet (40 mg total) by mouth every Monday, Wednesday, and Friday.   60 tablet   3   . cholecalciferol 5000 UNITS TABS   Oral   Take 5,000 Units by mouth daily.   90 tablet   3   . cloNIDine (CATAPRES) 0.1 MG tablet   Oral   Take 1 tablet (0.1 mg total) by mouth 2 (two) times daily as needed (take one daily in am, second if BP >150/100).   180 tablet   3   . co-enzyme Q-10 30 MG capsule   Oral   Take 1 capsule (30 mg total) by mouth daily.         Marland Kitchen conjugated estrogens (PREMARIN) vaginal cream      APPLY 1/4 APPLICATORFUL VAGINALLY THREE TIMES A WEEK   30 g   11   . esomeprazole (NEXIUM) 40 MG capsule   Oral   Take 1 capsule (40 mg total) by mouth daily.   90 capsule   3     Try once daily dosing and notify me if breakthroug ...   . ferrous sulfate 325 (65 FE) MG tablet   Oral   Take 1 tablet (325 mg total) by mouth daily with breakfast.         . furosemide (LASIX) 20 MG tablet   Oral   Take 1 tablet (20 mg total) by mouth daily as needed for edema.   90 tablet   1   . gabapentin (NEURONTIN) 300 MG capsule      TAKE 1 CAPSULE THREE TIMES DAILY   270 capsule   3   . metoprolol tartrate (LOPRESSOR) 25 MG tablet      TAKE 1 TABLET TWICE DAILY   180 tablet   3   . nystatin (MYCOSTATIN) powder      APPLY TOPICALLY TO AFFECTED AREA TWICE DAILY   30 g   0   . ondansetron (ZOFRAN-ODT) 4 MG disintegrating tablet   Oral   Take 1 tablet (4 mg total) by mouth 2 (two) times daily as needed for nausea or vomiting.   20 tablet   1     Use ODT instead of regular tablet. Thanks!   Marland Kitchen oxybutynin (DITROPAN-XL) 10 MG 24 hr tablet      TAKE 1 TABLET EVERY DAY   90 tablet   3   . potassium chloride (K-DUR)  10 MEQ tablet   Oral   Take 1 tablet (10 mEq  total) by mouth daily as needed (take with lasix).   90 tablet   1   . ranitidine (ZANTAC) 300 MG tablet   Oral   Take 1 tablet (300 mg total) by mouth at bedtime.   90 tablet   3   . sucralfate (CARAFATE) 1 G tablet   Oral   Take 1 tablet (1 g total) by mouth 2 (two) times daily.   360 tablet   3   . traMADol-acetaminophen (ULTRACET) 37.5-325 MG tablet   Oral   Take 1 tablet by mouth every 8 (eight) hours as needed. for pain   60 tablet   3   . traZODone (DESYREL) 100 MG tablet      TAKE 1/2 TO 1 TABLET AT BEDTIME   90 tablet   3   . vitamin C (ASCORBIC ACID) 500 MG tablet   Oral   Take 500 mg by mouth daily.         . VOLTAREN 1 % GEL      APPLY 4 GRAMS TOPICALLY FOUR TIMES DAILY AS NEEDED   100 g   1     Allergies Ivp dye; Penicillins; and Statins  Family History  Problem Relation Age of Onset  . Hyperlipidemia Mother   . Hypertension Mother   . Stroke Father     hemorrhage  . Other Brother     TB  . Coronary artery disease Brother   . Aneurysm Son 50    sudden death  . Diabetes Maternal Aunt   . Cancer Brother 23    lung  . Aneurysm Father 37    deceased    Social History Social History  Substance Use Topics  . Smoking status: Never Smoker   . Smokeless tobacco: Never Used  . Alcohol Use: No    Review of Systems  Constitutional: No fever/chills. Positive for myalgias. Eyes: No visual changes. ENT: No sore throat. Cardiovascular: Positive for chest pain. Respiratory: Positive for productive cough. Denies shortness of breath. Gastrointestinal: No abdominal pain.  No nausea, no vomiting.  No diarrhea.  No constipation. Genitourinary: Negative for dysuria. Musculoskeletal: Negative for back pain. Skin: Negative for rash. Neurological: Negative for headaches, focal weakness or numbness.  10-point ROS otherwise negative.  ____________________________________________   PHYSICAL EXAM:  VITAL SIGNS: ED Triage Vitals  Enc Vitals  Group     BP 12/06/15 0113 156/98 mmHg     Pulse Rate 12/06/15 0113 61     Resp 12/06/15 0113 20     Temp 12/06/15 0113 98.2 F (36.8 C)     Temp Source 12/06/15 0113 Oral     SpO2 12/06/15 0113 95 %     Weight 12/06/15 0113 190 lb (86.183 kg)     Height 12/06/15 0113 5' (1.524 m)     Head Cir --      Peak Flow --      Pain Score 12/06/15 0114 8     Pain Loc --      Pain Edu? --      Excl. in [place]? --     Constitutional: Sleeping, easily awakened for exam. Alert and oriented. Well appearing and in no acute distress. Eyes: Conjunctivae are normal. PERRL. EOMI. Head: Atraumatic. Nose: No congestion/rhinnorhea. Mouth/Throat: Mucous membranes are moist.  Oropharynx non-erythematous. Neck: No stridor.   Cardiovascular: Normal rate, regular rhythm. Grossly normal heart sounds.  Good peripheral circulation. Respiratory:  Normal respiratory effort.  No retractions. Lungs CTAB. Anterior chest wall tender to palpation and with movement of trunk. Gastrointestinal: Soft and nontender. No distention. No abdominal bruits. No CVA tenderness. Musculoskeletal: No lower extremity tenderness nor edema.  No joint effusions. Neurologic:  Normal speech and language. No gross focal neurologic deficits are appreciated. No gait instability. Skin:  Skin is warm, dry and intact. No rash noted. Psychiatric: Mood and affect are normal. Speech and behavior are normal.  ____________________________________________   LABS (all labs ordered are listed, but only abnormal results are displayed)  Labs Reviewed  BASIC METABOLIC PANEL - Abnormal; Notable for the following:    Glucose, Bld 119 (*)    GFR calc non Af Amer 57 (*)    All other components within normal limits  RAPID INFLUENZA A&B ANTIGENS ([facility] ONLY)  CBC  TROPONIN I  TROPONIN I   ____________________________________________  EKG  ED ECG REPORT I, Daniyal Tabor J, the attending physician, personally viewed and interpreted this ECG.   Date:  12/06/2015  EKG Time: 0116  Rate: 87  Rhythm: normal EKG, normal sinus rhythm  Axis: Normal  Intervals:right bundle branch block  ST&T Change: Nonspecific  ____________________________________________  RADIOLOGY  Chest 2 view (view by me, interpreted per Dr. Radene Knee): 1. Mild bibasilar atelectasis noted. Lungs otherwise clear. 2. Small to moderate hiatal hernia seen. ____________________________________________   PROCEDURES  Procedure(s) performed: None  Critical Care performed: No  ____________________________________________   INITIAL IMPRESSION / ASSESSMENT AND PLAN / ED COURSE  Pertinent labs & imaging results that were available during my care of the patient were reviewed by me and considered in my medical decision making (see chart for details).  81 year old female with a history of asthma who presents with nonproductive cough and myalgias. Initial laboratory results and imaging studies reassuring. Will repeat troponin, administer analgesia and reassess. Given patient's flu exposure, will obtain influenza swab.  ----------------------------------------- 6:42 AM on 12/06/2015 -----------------------------------------  Updated patient and daughter of negative influenza swab and negative repeat troponin. She is feeling better after Tussionex and intramuscular Toradol. Will prescribe Tussionex; patient takes tramadol at home and will continue to use this for pain relief. Strict return precautions given. Both verbalize understanding and agree with plan of care. ____________________________________________   FINAL CLINICAL IMPRESSION(S) / ED DIAGNOSES  Final diagnoses:  Viral respiratory illness  Chest wall pain      Paulette Blanch, MD 12/06/15 [phone]

## 2015-12-14 ENCOUNTER — Ambulatory Visit (INDEPENDENT_AMBULATORY_CARE_PROVIDER_SITE_OTHER): Payer: Medicare PPO | Admitting: Family Medicine

## 2015-12-14 ENCOUNTER — Encounter: Payer: Self-pay | Admitting: Family Medicine

## 2015-12-14 ENCOUNTER — Ambulatory Visit: Payer: Medicare PPO | Admitting: Family Medicine

## 2015-12-14 VITALS — BP 136/78 | HR 68 | Temp 98.6°F | Wt 196.8 lb

## 2015-12-14 DIAGNOSIS — R938 Abnormal findings on diagnostic imaging of other specified body structures: Secondary | ICD-10-CM

## 2015-12-14 DIAGNOSIS — N200 Calculus of kidney: Secondary | ICD-10-CM | POA: Diagnosis not present

## 2015-12-14 DIAGNOSIS — R319 Hematuria, unspecified: Secondary | ICD-10-CM

## 2015-12-14 DIAGNOSIS — J209 Acute bronchitis, unspecified: Secondary | ICD-10-CM | POA: Diagnosis not present

## 2015-12-14 DIAGNOSIS — R9389 Abnormal findings on diagnostic imaging of other specified body structures: Secondary | ICD-10-CM

## 2015-12-14 LAB — POC URINALSYSI DIPSTICK (AUTOMATED)
BILIRUBIN UA: NEGATIVE
Blood, UA: NEGATIVE
GLUCOSE UA: NEGATIVE
Ketones, UA: NEGATIVE
Leukocytes, UA: NEGATIVE
Nitrite, UA: NEGATIVE
Protein, UA: NEGATIVE
SPEC GRAV UA: 1.025
UROBILINOGEN UA: 1
pH, UA: 6

## 2015-12-14 MED ORDER — AZITHROMYCIN 250 MG PO TABS: ORAL_TABLET | ORAL | Status: DC

## 2015-12-14 NOTE — Assessment & Plan Note (Signed)
Discussed with patient abnormal pelvic ultrasound (3.1cm fibroid, 1mm thickened endometrial stripe with ?endometrial cysts). Pt aware biopsy is next step to help r/o cancer. Does not think she wants to pursue further evaluation, has discussed with daughter. Will let us know if changes her mind.  Will again request records from Dr [facility] office from eval 2013

## 2015-12-14 NOTE — Assessment & Plan Note (Signed)
nonobstructing by CT scan.  Prior hematuria - repeat today.

## 2015-12-14 NOTE — Progress Notes (Signed)
Pre visit review using our clinic review tool, if applicable. No additional management support is needed unless otherwise documented below in the visit note. 

## 2015-12-14 NOTE — Addendum Note (Signed)
Addended by: Royann Shivers A on: 12/14/2015 11:09 AM   Modules accepted: Orders

## 2015-12-14 NOTE — Assessment & Plan Note (Signed)
1+ wk of productive cough, not improving. Will cover for atypical bronchitis with zpack. Could not afford tussionex. Continue tramadol and OTC cough syrup PRN cough.

## 2015-12-14 NOTE — Patient Instructions (Addendum)
Urinalysis today (to lab for micro)  You do have uterine fibroid. Uterine lining was also thicker than it should be. If you want to proceed with further evaluation, next step is to get uterine biopsy to ensure nothing bad like a cancer is growing. Watch for any pelvic pain/fullness or bleeding.  Continue slow taper of premarin cream. Decrease to once weekly, if doing well, may stop.  Take zpack antibiotic for bronchitis. Let us know if not continuing to improve as expected. Good to see you today, call us with questions.  Return after 7/11 for physical

## 2015-12-14 NOTE — Progress Notes (Signed)
BP 136/78 mmHg  Pulse 68  Temp(Src) 98.6 F (37 C) (Oral)  Wt 196 lb 12 oz (89.245 kg)  SpO2 95%   CC: 3 mo f/u visit, ER f/u visit  Subjective:    Patient ID: Brianna Woods, female    DOB: 13-Jan-1935, 81 y.o.   MRN: [id number]  HPI: Brianna Woods is a 81 y.o. female presenting on 12/14/2015 for Follow-up   Recent ER visit on 12/06/2015 for cough and myalgias. CXR stable. EKG with stable RBBB. Flu swab negative. Treated with tussionex and intramuscular toradol. Sent home with tramadol prn and tussionex cough suppressant and prednisone. Unable to afford tussionex. Dx with R sided pleurisy. Has been taking OTC cough syrup + tramadol. Persistent productive cough of yellow sputum. Endorses h/o asthma and recurrent bronchitis.   Due for rpt UA given h/o blood and kidney stones. Persistent R flank pain. Takes voltaren gel for this.   Increased endometrial stripe incidentally found on prior imaging with reported normal endometrial biopsy by Dr Yong Channel at [facility] 2013 (I never received report). Rpt imaging pelvic US showing likely 3.1cm fibroid with endometrial stripe thickening to 11.62mm with tiny cysts. Ovaries not visualized. Pt endorses h/o endometriosis. She does not think she would want to pursue further evaluation of this given age. Has discussed this with her daughter.   She continues to slowly cut down on premarin cream.   Relevant past medical, surgical, family and social history reviewed and updated as indicated. Interim medical history since our last visit reviewed. Allergies and medications reviewed and updated. Current Outpatient Prescriptions on File Prior to Visit  Medication Sig  . albuterol (VENTOLIN HFA) 108 (90 BASE) MCG/ACT inhaler Inhale 2 puffs into the lungs every 6 (six) hours as needed. for wheezing  . atorvastatin (LIPITOR) 40 MG tablet Take 1 tablet (40 mg total) by mouth every Monday, Wednesday, and Friday.  . cloNIDine (CATAPRES) 0.1 MG tablet Take 1 tablet (0.1 mg total)  by mouth 2 (two) times daily as needed (take one daily in am, second if BP >150/100).  Marland Kitchen co-enzyme Q-10 30 MG capsule Take 1 capsule (30 mg total) by mouth daily.  Marland Kitchen esomeprazole (NEXIUM) 40 MG capsule Take 1 capsule (40 mg total) by mouth daily.  . ferrous sulfate 325 (65 FE) MG tablet Take 1 tablet (325 mg total) by mouth daily with breakfast.  . furosemide (LASIX) 20 MG tablet Take 1 tablet (20 mg total) by mouth daily as needed for edema.  . gabapentin (NEURONTIN) 300 MG capsule TAKE 1 CAPSULE THREE TIMES DAILY  . metoprolol tartrate (LOPRESSOR) 25 MG tablet TAKE 1 TABLET TWICE DAILY  . nystatin (MYCOSTATIN) powder APPLY TOPICALLY TO AFFECTED AREA TWICE DAILY  . ondansetron (ZOFRAN-ODT) 4 MG disintegrating tablet Take 1 tablet (4 mg total) by mouth 2 (two) times daily as needed for nausea or vomiting.  Marland Kitchen oxybutynin (DITROPAN-XL) 10 MG 24 hr tablet TAKE 1 TABLET EVERY DAY  . potassium chloride (K-DUR) 10 MEQ tablet Take 1 tablet (10 mEq total) by mouth daily as needed (take with lasix).  . ranitidine (ZANTAC) 300 MG tablet Take 1 tablet (300 mg total) by mouth at bedtime.  . sucralfate (CARAFATE) 1 G tablet Take 1 tablet (1 g total) by mouth 2 (two) times daily.  . traMADol-acetaminophen (ULTRACET) 37.5-325 MG tablet Take 1 tablet by mouth every 8 (eight) hours as needed. for pain  . traZODone (DESYREL) 100 MG tablet TAKE 1/2 TO 1 TABLET AT BEDTIME  . vitamin C (ASCORBIC  ACID) 500 MG tablet Take 500 mg by mouth daily.  . VOLTAREN 1 % GEL APPLY 4 GRAMS TOPICALLY FOUR TIMES DAILY AS NEEDED  . amLODipine (NORVASC) 10 MG tablet Take 1 tablet (10 mg total) by mouth daily. (Patient not taking: Reported on 12/14/2015)  . cholecalciferol 5000 UNITS TABS Take 5,000 Units by mouth daily. (Patient not taking: Reported on 12/14/2015)  . [DISCONTINUED] pirbuterol (MAXAIR) 200 MCG/INH inhaler Inhale 2 puffs into the lungs 4 (four) times daily.   No current facility-administered medications on file prior to  visit.    Review of Systems Per HPI unless specifically indicated in ROS section     Objective:    BP 136/78 mmHg  Pulse 68  Temp(Src) 98.6 F (37 C) (Oral)  Wt 196 lb 12 oz (89.245 kg)  SpO2 95%  Wt Readings from Last 3 Encounters:  12/14/15 196 lb 12 oz (89.245 kg)  12/06/15 190 lb (86.183 kg)  10/21/15 193 lb 8 oz (87.771 kg)    Physical Exam  Constitutional: She appears well-developed and well-nourished. No distress.  Able to get on exam table without assistance  HENT:  Mouth/Throat: Oropharynx is clear and moist. No oropharyngeal exudate.  Eyes: Conjunctivae and EOM are normal. Pupils are equal, round, and reactive to light. No scleral icterus.  Cardiovascular: Normal rate, regular rhythm, normal heart sounds and intact distal pulses.   No murmur heard. Pulmonary/Chest: Effort normal and breath sounds normal. No respiratory distress. She has no wheezes. She has no rales.  Musculoskeletal: She exhibits edema (1+).  Psychiatric: She has a normal mood and affect. Her behavior is normal.  Nursing note and vitals reviewed.  Results for orders placed or performed during the hospital encounter of 12/06/15  Rapid Influenza A&B Antigens ([facility] only)  Result Value Ref Range   Influenza A ([facility]) NEGATIVE NEGATIVE   Influenza B ([facility]) NEGATIVE NEGATIVE  Basic metabolic panel  Result Value Ref Range   Sodium 139 135 - 145 mmol/L   Potassium 3.5 3.5 - 5.1 mmol/L   Chloride 106 101 - 111 mmol/L   CO2 26 22 - 32 mmol/L   Glucose, Bld 119 (H) 65 - 99 mg/dL   BUN 10 6 - 20 mg/dL   Creatinine, Ser 0.94 0.44 - 1.00 mg/dL   Calcium 9.4 8.9 - 10.3 mg/dL   GFR calc non Af Amer 57 (L) >60 mL/min   GFR calc Af Amer >60 >60 mL/min   Anion gap 7 5 - 15  CBC  Result Value Ref Range   WBC 6.5 3.6 - 11.0 K/uL   RBC 3.95 3.80 - 5.20 MIL/uL   Hemoglobin 12.0 12.0 - 16.0 g/dL   HCT 35.9 35.0 - 47.0 %   MCV 91.0 80.0 - 100.0 fL   MCH 30.5 26.0 - 34.0 pg   MCHC 33.5 32.0 - 36.0 g/dL    RDW 13.6 11.5 - 14.5 %   Platelets 211 150 - 440 K/uL  Troponin I  Result Value Ref Range   Troponin I <0.03 <0.031 ng/mL  Troponin I  Result Value Ref Range   Troponin I <0.03 <0.031 ng/mL      Assessment & Plan:   Problem List Items Addressed This Visit    Increased endometrial stripe thickness    Discussed with patient abnormal pelvic ultrasound (3.1cm fibroid, 78mm thickened endometrial stripe with ?endometrial cysts). Pt aware biopsy is next step to help r/o cancer. Does not think she wants to pursue further evaluation, has discussed with  daughter. Will let us know if changes her mind.  Will again request records from [facility] office from eval 2013      Nephrolithiasis    nonobstructing by CT scan.  Prior hematuria - repeat today.       Acute bronchitis - Primary    1+ wk of productive cough, not improving. Will cover for atypical bronchitis with zpack. Could not afford tussionex. Continue tramadol and OTC cough syrup PRN cough.           Follow up plan: Return in about 3 months (around 03/21/2016), or as needed, for annual exam, prior fasting for blood work.  Ria Bush, MD

## 2015-12-15 ENCOUNTER — Encounter: Payer: Self-pay | Admitting: *Deleted

## 2015-12-21 ENCOUNTER — Ambulatory Visit: Payer: Medicare PPO | Admitting: Family Medicine

## 2016-01-20 ENCOUNTER — Other Ambulatory Visit: Payer: Self-pay | Admitting: Family Medicine

## 2016-01-20 NOTE — Telephone Encounter (Signed)
plz phone in. 

## 2016-01-20 NOTE — Telephone Encounter (Signed)
Medication phoned to pharmacy.  

## 2016-01-26 ENCOUNTER — Telehealth: Payer: Self-pay | Admitting: *Deleted

## 2016-01-26 MED ORDER — LEVOFLOXACIN 500 MG PO TABS: 500.0000 mg | ORAL_TABLET | Freq: Every day | ORAL | Status: DC

## 2016-01-26 NOTE — Telephone Encounter (Signed)
Pt left message at Triage. Pt said since her appt in April for bronchitis she has been dealing with the same sxs. Pt said they were manageable until last Friday. Pt said her sxs have worsened since Friday. Pt said she has a really bad cough that's keeping her up at night. Pt also has very bad congestion with very thick yellow mucous. Pt said due to all the coughing she also has a sore throat and chest pain/congestion. Pt said she doesn't have a fever, and she is not really SOB (just baseline SOB), pt said she doesn't have transportation to come in for an appt. And is requesting we send her in an abx to the [facility] in [place]

## 2016-01-26 NOTE — Telephone Encounter (Signed)
plz notify levaquin 5 course sent to pharmacy.  I do want her to come in for eval as soon as she can (this week) for this however - as may need xray if prolonged illness.

## 2016-01-26 NOTE — Telephone Encounter (Signed)
Unable to leave message

## 2016-01-31 ENCOUNTER — Telehealth: Payer: Self-pay | Admitting: Family Medicine

## 2016-01-31 NOTE — Telephone Encounter (Signed)
Patient notified as instructed by telephone and verbalized understanding. Patient stated that she is okay with having the test done, but it needs to be after February 15, 2016 because she is having a biopsy on a fibroid tumor that day.. . Advised patient that one of the referral coordinator will be in touch to get this scheduled for her.

## 2016-01-31 NOTE — Telephone Encounter (Signed)
plz try again. Also notify pt she had abdominal CT 07/2015 showing L kidney lesions that were small but radiologist had recommended repeating imaging with MRI to further characterize. Would offer scheduling MRI to evaluate left kidney spots.

## 2016-01-31 NOTE — Telephone Encounter (Signed)
See prior phone note. rec eval in office.

## 2016-01-31 NOTE — Telephone Encounter (Signed)
Patient notified as instructed by telephone and verbalized understanding. Patient stated that she does not have any transportation and it cost her to get a ride to the office. Patient stated that she is going to hold off on an office visit until she sees what happens with her appointment inspection and see it she gets an eviction notice. . Patient stated that she will call back and schedule an appointment if she needs a letter.

## 2016-01-31 NOTE — Telephone Encounter (Signed)
Pt needs a letter stating that she is being treated for an upper respiratory illness since February.  She needs the letter so that she can be exempt from an apartment inspection .   cb number is [phone]

## 2016-02-04 ENCOUNTER — Other Ambulatory Visit: Payer: Self-pay | Admitting: Family Medicine

## 2016-02-12 NOTE — Telephone Encounter (Signed)
Repeat CT did not show this - will discuss at f/u visit. Consider starting with renal US

## 2016-02-15 ENCOUNTER — Other Ambulatory Visit: Payer: Self-pay | Admitting: Family Medicine

## 2016-02-15 NOTE — Telephone Encounter (Signed)
Ok to refill 

## 2016-02-15 NOTE — Telephone Encounter (Signed)
plz phone in. 

## 2016-02-16 NOTE — Telephone Encounter (Signed)
Rx called in as directed.   

## 2016-03-06 ENCOUNTER — Other Ambulatory Visit: Payer: Self-pay | Admitting: Family Medicine

## 2016-03-22 ENCOUNTER — Other Ambulatory Visit: Payer: Medicare PPO

## 2016-03-22 ENCOUNTER — Ambulatory Visit: Payer: Medicare PPO

## 2016-03-24 DIAGNOSIS — R938 Abnormal findings on diagnostic imaging of other specified body structures: Secondary | ICD-10-CM | POA: Diagnosis not present

## 2016-03-25 ENCOUNTER — Other Ambulatory Visit: Payer: Self-pay | Admitting: Family Medicine

## 2016-03-27 ENCOUNTER — Encounter: Payer: Medicare PPO | Admitting: Family Medicine

## 2016-03-27 NOTE — Telephone Encounter (Signed)
Rx called in as directed.   

## 2016-03-27 NOTE — Telephone Encounter (Signed)
plz phone in. 

## 2016-04-21 ENCOUNTER — Ambulatory Visit (INDEPENDENT_AMBULATORY_CARE_PROVIDER_SITE_OTHER): Payer: Medicare PPO | Admitting: Family Medicine

## 2016-04-21 ENCOUNTER — Ambulatory Visit: Payer: Medicare PPO

## 2016-04-21 ENCOUNTER — Encounter: Payer: Self-pay | Admitting: Family Medicine

## 2016-04-21 ENCOUNTER — Ambulatory Visit (INDEPENDENT_AMBULATORY_CARE_PROVIDER_SITE_OTHER): Payer: Medicare PPO

## 2016-04-21 VITALS — BP 140/90 | HR 74 | Temp 98.7°F | Ht 60.0 in | Wt 191.0 lb

## 2016-04-21 DIAGNOSIS — N3946 Mixed incontinence: Secondary | ICD-10-CM

## 2016-04-21 DIAGNOSIS — E0520 Thyrotoxicosis with toxic multinodular goiter without thyrotoxic crisis or storm: Secondary | ICD-10-CM | POA: Diagnosis not present

## 2016-04-21 DIAGNOSIS — E785 Hyperlipidemia, unspecified: Secondary | ICD-10-CM | POA: Diagnosis not present

## 2016-04-21 DIAGNOSIS — D509 Iron deficiency anemia, unspecified: Secondary | ICD-10-CM | POA: Diagnosis not present

## 2016-04-21 DIAGNOSIS — R600 Localized edema: Secondary | ICD-10-CM

## 2016-04-21 DIAGNOSIS — Z0000 Encounter for general adult medical examination without abnormal findings: Secondary | ICD-10-CM

## 2016-04-21 DIAGNOSIS — R9389 Abnormal findings on diagnostic imaging of other specified body structures: Secondary | ICD-10-CM

## 2016-04-21 DIAGNOSIS — I700 Atherosclerosis of aorta: Secondary | ICD-10-CM

## 2016-04-21 DIAGNOSIS — R21 Rash and other nonspecific skin eruption: Secondary | ICD-10-CM | POA: Diagnosis not present

## 2016-04-21 DIAGNOSIS — I10 Essential (primary) hypertension: Secondary | ICD-10-CM

## 2016-04-21 DIAGNOSIS — I2510 Atherosclerotic heart disease of native coronary artery without angina pectoris: Secondary | ICD-10-CM

## 2016-04-21 DIAGNOSIS — Z7189 Other specified counseling: Secondary | ICD-10-CM

## 2016-04-21 DIAGNOSIS — R938 Abnormal findings on diagnostic imaging of other specified body structures: Secondary | ICD-10-CM

## 2016-04-21 LAB — BASIC METABOLIC PANEL
BUN: 13 mg/dL (ref 6–23)
CHLORIDE: 102 meq/L (ref 96–112)
CO2: 30 meq/L (ref 19–32)
CREATININE: 0.95 mg/dL (ref 0.40–1.20)
Calcium: 10 mg/dL (ref 8.4–10.5)
GFR: 72.91 mL/min (ref >60.00)
Glucose, Bld: 99 mg/dL (ref 70–99)
POTASSIUM: 4 meq/L (ref 3.5–5.1)
Sodium: 140 mEq/L (ref 135–145)

## 2016-04-21 LAB — CBC WITH DIFFERENTIAL/PLATELET
BASOS PCT: 0.4 % (ref 0.0–3.0)
Basophils Absolute: 0 10*3/uL (ref 0.0–0.1)
EOS ABS: 0.2 10*3/uL (ref 0.0–0.7)
EOS PCT: 1.6 % (ref 0.0–5.0)
HEMATOCRIT: 36.6 % (ref 36.0–46.0)
HEMOGLOBIN: 12.2 g/dL (ref 12.0–15.0)
LYMPHS PCT: 23.7 % (ref 12.0–46.0)
Lymphs Abs: 2.4 10*3/uL (ref 0.7–4.0)
MCHC: 33.2 g/dL (ref 30.0–36.0)
MCV: 89.3 fl (ref 78.0–100.0)
Monocytes Absolute: 0.7 10*3/uL (ref 0.1–1.0)
Monocytes Relative: 6.9 % (ref 3.0–12.0)
NEUTROS ABS: 6.7 10*3/uL (ref 1.4–7.7)
Neutrophils Relative %: 67.4 % (ref 43.0–77.0)
PLATELETS: 232 10*3/uL (ref 150.0–400.0)
RBC: 4.1 Mil/uL (ref 3.87–5.11)
RDW: 13.9 % (ref 11.5–15.5)
WBC: 10 10*3/uL (ref 4.0–10.5)

## 2016-04-21 LAB — LIPID PANEL
CHOLESTEROL: 203 mg/dL — AB (ref 0–200)
HDL: 54.4 mg/dL (ref >39.00)
LDL Cholesterol: 129 mg/dL — ABNORMAL HIGH (ref 0–99)
NonHDL: 148.95
Total CHOL/HDL Ratio: 4
Triglycerides: 99 mg/dL (ref 0.0–149.0)
VLDL: 19.8 mg/dL (ref 0.0–40.0)

## 2016-04-21 LAB — TSH: TSH: 1.31 u[IU]/mL (ref 0.35–4.50)

## 2016-04-21 MED ORDER — MUPIROCIN CALCIUM 2 % EX CREA: 1.0000 "application " | TOPICAL_CREAM | Freq: Two times a day (BID) | CUTANEOUS | 0 refills | Status: DC

## 2016-04-21 MED ORDER — ASPIRIN EC 81 MG PO TBEC: 81.0000 mg | DELAYED_RELEASE_TABLET | Freq: Every day | ORAL | Status: DC

## 2016-04-21 NOTE — Patient Instructions (Addendum)
You are doing well today. Return as needed or in 4 months for follow up visit. Try mupirocin antibiotic cream sent to pharmacy for spots on legs.  Lab work today.   Health Maintenance, Female Adopting a healthy lifestyle and getting preventive care can go a long way to promote health and wellness. Talk with your health care provider about what schedule of regular examinations is right for you. This is a good chance for you to check in with your provider about disease prevention and staying healthy. In between checkups, there are plenty of things you can do on your own. Experts have done a lot of research about which lifestyle changes and preventive measures are most likely to keep you healthy. Ask your health care provider for more information. WEIGHT AND DIET  Eat a healthy diet  Be sure to include plenty of vegetables, fruits, low-fat dairy products, and lean protein.  Do not eat a lot of foods high in solid fats, added sugars, or salt.  Get regular exercise. This is one of the most important things you can do for your health.  Most adults should exercise for at least 150 minutes each week. The exercise should increase your heart rate and make you sweat (moderate-intensity exercise).  Most adults should also do strengthening exercises at least twice a week. This is in addition to the moderate-intensity exercise.  Maintain a healthy weight  Body mass index (BMI) is a measurement that can be used to identify possible weight problems. It estimates body fat based on height and weight. Your health care provider can help determine your BMI and help you achieve or maintain a healthy weight.  For females 64 years of age and older:   A BMI below 18.5 is considered underweight.  A BMI of 18.5 to 24.9 is normal.  A BMI of 25 to 29.9 is considered overweight.  A BMI of 30 and above is considered obese.  Watch levels of cholesterol and blood lipids  You should start having your blood  tested for lipids and cholesterol at 79 years of age, then have this test every 5 years.  You may need to have your cholesterol levels checked more often if:  Your lipid or cholesterol levels are high.  You are older than 79 years of age.  You are at high risk for heart disease.  CANCER SCREENING   Lung Cancer  Lung cancer screening is recommended for adults 15-68 years old who are at high risk for lung cancer because of a history of smoking.  A yearly low-dose CT scan of the lungs is recommended for people who:  Currently smoke.  Have quit within the past 15 years.  Have at least a 30-pack-year history of smoking. A pack year is smoking an average of one pack of cigarettes a day for 1 year.  Yearly screening should continue until it has been 15 years since you quit.  Yearly screening should stop if you develop a health problem that would prevent you from having lung cancer treatment.  Breast Cancer  Practice breast self-awareness. This means understanding how your breasts normally appear and feel.  It also means doing regular breast self-exams. Let your health care provider know about any changes, no matter how small.  If you are in your 20s or 30s, you should have a clinical breast exam (CBE) by a health care provider every 1-3 years as part of a regular health exam.  If you are 72 or older, have a CBE  every year. Also consider having a breast X-ray (mammogram) every year.  If you have a family history of breast cancer, talk to your health care provider about genetic screening.  If you are at high risk for breast cancer, talk to your health care provider about having an MRI and a mammogram every year.  Breast cancer gene (BRCA) assessment is recommended for women who have family members with BRCA-related cancers. BRCA-related cancers include:  Breast.  Ovarian.  Tubal.  Peritoneal cancers.  Results of the assessment will determine the need for genetic counseling  and BRCA1 and BRCA2 testing. Cervical Cancer Your health care provider may recommend that you be screened regularly for cancer of the pelvic organs (ovaries, uterus, and vagina). This screening involves a pelvic examination, including checking for microscopic changes to the surface of your cervix (Pap test). You may be encouraged to have this screening done every 3 years, beginning at age 54.  For women ages 62-65, health care providers may recommend pelvic exams and Pap testing every 3 years, or they may recommend the Pap and pelvic exam, combined with testing for human papilloma virus (HPV), every 5 years. Some types of HPV increase your risk of cervical cancer. Testing for HPV may also be done on women of any age with unclear Pap test results.  Other health care providers may not recommend any screening for nonpregnant women who are considered low risk for pelvic cancer and who do not have symptoms. Ask your health care provider if a screening pelvic exam is right for you.  If you have had past treatment for cervical cancer or a condition that could lead to cancer, you need Pap tests and screening for cancer for at least 20 years after your treatment. If Pap tests have been discontinued, your risk factors (such as having a new sexual partner) need to be reassessed to determine if screening should resume. Some women have medical problems that increase the chance of getting cervical cancer. In these cases, your health care provider may recommend more frequent screening and Pap tests. Colorectal Cancer  This type of cancer can be detected and often prevented.  Routine colorectal cancer screening usually begins at 79 years of age and continues through 79 years of age.  Your health care provider may recommend screening at an earlier age if you have risk factors for colon cancer.  Your health care provider may also recommend using home test kits to check for hidden blood in the stool.  A small camera  at the end of a tube can be used to examine your colon directly (sigmoidoscopy or colonoscopy). This is done to check for the earliest forms of colorectal cancer.  Routine screening usually begins at age 4.  Direct examination of the colon should be repeated every 5-10 years through 79 years of age. However, you may need to be screened more often if early forms of precancerous polyps or small growths are found. Skin Cancer  Check your skin from head to toe regularly.  Tell your health care provider about any new moles or changes in moles, especially if there is a change in a mole's shape or color.  Also tell your health care provider if you have a mole that is larger than the size of a pencil eraser.  Always use sunscreen. Apply sunscreen liberally and repeatedly throughout the day.  Protect yourself by wearing long sleeves, pants, a wide-brimmed hat, and sunglasses whenever you are outside. HEART DISEASE, DIABETES, AND HIGH BLOOD PRESSURE  High blood pressure causes heart disease and increases the risk of stroke. High blood pressure is more likely to develop in:  People who have blood pressure in the high end of the normal range (130-139/85-89 mm Hg).  People who are overweight or obese.  People who are African American.  If you are 18-39 years of age, have your blood pressure checked every 3-5 years. If you are 40 years of age or older, have your blood pressure checked every year. You should have your blood pressure measured twice--once when you are at a hospital or clinic, and once when you are not at a hospital or clinic. Record the average of the two measurements. To check your blood pressure when you are not at a hospital or clinic, you can use:  An automated blood pressure machine at a pharmacy.  A home blood pressure monitor.  If you are between 55 years and 79 years old, ask your health care provider if you should take aspirin to prevent strokes.  Have regular diabetes  screenings. This involves taking a blood sample to check your fasting blood sugar level.  If you are at a normal weight and have a low risk for diabetes, have this test once every three years after 79 years of age.  If you are overweight and have a high risk for diabetes, consider being tested at a younger age or more often. PREVENTING INFECTION  Hepatitis B  If you have a higher risk for hepatitis B, you should be screened for this virus. You are considered at high risk for hepatitis B if:  You were born in a country where hepatitis B is common. Ask your health care provider which countries are considered high risk.  Your parents were born in a high-risk country, and you have not been immunized against hepatitis B (hepatitis B vaccine).  You have HIV or AIDS.  You use needles to inject street drugs.  You live with someone who has hepatitis B.  You have had sex with someone who has hepatitis B.  You get hemodialysis treatment.  You take certain medicines for conditions, including cancer, organ transplantation, and autoimmune conditions. Hepatitis C  Blood testing is recommended for:  Everyone born from 1945 through 1965.  Anyone with known risk factors for hepatitis C. Sexually transmitted infections (STIs)  You should be screened for sexually transmitted infections (STIs) including gonorrhea and chlamydia if:  You are sexually active and are younger than 79 years of age.  You are older than 79 years of age and your health care provider tells you that you are at risk for this type of infection.  Your sexual activity has changed since you were last screened and you are at an increased risk for chlamydia or gonorrhea. Ask your health care provider if you are at risk.  If you do not have HIV, but are at risk, it may be recommended that you take a prescription medicine daily to prevent HIV infection. This is called pre-exposure prophylaxis (PrEP). You are considered at risk  if:  You are sexually active and do not regularly use condoms or know the HIV status of your partner(s).  You take drugs by injection.  You are sexually active with a partner who has HIV. Talk with your health care provider about whether you are at high risk of being infected with HIV. If you choose to begin PrEP, you should first be tested for HIV. You should then be tested every 3 months for as   long as you are taking PrEP.  PREGNANCY   If you are premenopausal and you may become pregnant, ask your health care provider about preconception counseling.  If you may become pregnant, take 400 to 800 micrograms (mcg) of folic acid every day.  If you want to prevent pregnancy, talk to your health care provider about birth control (contraception). OSTEOPOROSIS AND MENOPAUSE   Osteoporosis is a disease in which the bones lose minerals and strength with aging. This can result in serious bone fractures. Your risk for osteoporosis can be identified using a bone density scan.  If you are 30 years of age or older, or if you are at risk for osteoporosis and fractures, ask your health care provider if you should be screened.  Ask your health care provider whether you should take a calcium or vitamin D supplement to lower your risk for osteoporosis.  Menopause may have certain physical symptoms and risks.  Hormone replacement therapy may reduce some of these symptoms and risks. Talk to your health care provider about whether hormone replacement therapy is right for you.  HOME CARE INSTRUCTIONS   Schedule regular health, dental, and eye exams.  Stay current with your immunizations.   Do not use any tobacco products including cigarettes, chewing tobacco, or electronic cigarettes.  If you are pregnant, do not drink alcohol.  If you are breastfeeding, limit how much and how often you drink alcohol.  Limit alcohol intake to no more than 1 drink per day for nonpregnant women. One drink equals 12  ounces of beer, 5 ounces of wine, or 1 ounces of hard liquor.  Do not use street drugs.  Do not share needles.  Ask your health care provider for help if you need support or information about quitting drugs.  Tell your health care provider if you often feel depressed.  Tell your health care provider if you have ever been abused or do not feel safe at home.   This information is not intended to replace advice given to you by your health care provider. Make sure you discuss any questions you have with your health care provider.   Document Released: 03/13/2011 Document Revised: 09/18/2014 Document Reviewed: 07/30/2013 Elsevier Interactive Patient Education [employer].

## 2016-04-21 NOTE — Assessment & Plan Note (Signed)
Advanced directive: not assessed today. From past visit: has not set up. Has discussed with family her wishes. Would want Brianna Woods daughter in [place] to be HCPOA. Wants full code but no prolonged life support if terminal.

## 2016-04-21 NOTE — Assessment & Plan Note (Signed)
Chronic, stable. Continue current regimen. Off amlodipine due to pedal edema.

## 2016-04-21 NOTE — Assessment & Plan Note (Signed)
Pending f/u with Dr Leonides Schanz next month.

## 2016-04-21 NOTE — Assessment & Plan Note (Signed)
Pt endorses this has improved off amlodipine.

## 2016-04-21 NOTE — Assessment & Plan Note (Signed)
Stable period on ditropan.

## 2016-04-21 NOTE — Assessment & Plan Note (Signed)
Continue aspirin and lipitor 20mg  MWF

## 2016-04-21 NOTE — Assessment & Plan Note (Signed)
Last saw Dr Gabriel Carina 06/2014. Check TSH today. Discussed return to endo.

## 2016-04-21 NOTE — Assessment & Plan Note (Signed)
Continue aspirin and statin. 

## 2016-04-21 NOTE — Progress Notes (Signed)
PCP notes:   Health maintenance:  Flu vaccine - addressed Shingles - declined Tetanus - addressed  Abnormal screenings:   Hearing - failed Fall risk - hx of fall without injury  Patient concerns:   None  Nurse concerns:  None  Next PCP appt:   04/21/2016 @ 1130

## 2016-04-21 NOTE — Assessment & Plan Note (Signed)
Possible bacterial infection - treat with mupirocin. Update if not better with treatment.

## 2016-04-21 NOTE — Assessment & Plan Note (Addendum)
Tolerating atorvastatin MWF + coq10.  Check FLP today.

## 2016-04-21 NOTE — Progress Notes (Signed)
BP 140/90 (BP Location: Left Arm, Patient Position: Sitting, Cuff Size: Normal) Comment: has not taken BP medications  Pulse 74   Temp 98.7 F (37.1 C) (Oral)   Ht 5' (1.524 m)   Wt 191 lb (86.6 kg)   SpO2 96%   BMI 37.30 kg/m    CC: CPE Subjective:    Patient ID: Brianna Woods, female    DOB: 03-Jun-1935, 81 y.o.   MRN: [id number]  HPI: Anabelen Winebarger is a 81 y.o. female presenting on 04/21/2016 for Annual Exam   Saw Katha Cabal today for medicare wellness visit. Note will be reviewed when completed.   Seen by Dr Gabriel Carina for multinodular goiter with subclinical hyperthyroidism on long term methimazole s/p radioactive ablation 2015. Has not return to see her.  Lab Results  Component Value Date   TSH 1.01 03/22/2015     Seeing Dr Leonides Schanz for pelvic concerns (h/o thickened endometrial stripe). Upcoming appt next month.   Off amlodipine due to pedal edema.   Drank chocolate milk at 6am.   Preventative:  ESOPHAGOGASTRODUODENOSCOPY Date: 11/2011 LA grade A esophagitis lower 1/3, dilated, med HH, o/w WNL - path: + GERD, no barrett's - f/u 11/2016  COLONOSCOPY Date: 10/02/12 diverticulosis, o/w WNL ([place])  Mammogram WNL 03/2015. Will check Q2 yrs Well woman - last done 2012. H/o calcified benign ovarian cyst. To see Dr Leonides Schanz.  DEXA - in [place] normal per patient. Flu shot yearly Tetanus - 2008  Pneumonia shot - done 2010. prevnar 2015 Shingles shot - doesn't think would want. Advanced directive: not assessed today. From past visit: has not set up. Has discussed with family her wishes. Would want Jannet Mantis daughter in [place] to be HCPOA. Wants full code but no prolonged life support if terminal. Seat belt use discussed.   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 Interim medical history since our last visit reviewed. Allergies and medications reviewed and updated. Current Outpatient Prescriptions on File Prior to Visit  Medication Sig  . albuterol (VENTOLIN HFA) 108 (90 BASE) MCG/ACT inhaler Inhale 2 puffs into the lungs every 6 (six) hours as needed. for wheezing  . atorvastatin (LIPITOR) 40 MG tablet Take 1 tablet (40 mg total) by mouth every Monday, Wednesday, and Friday.  . cloNIDine (CATAPRES) 0.1 MG tablet Take 1 tablet (0.1 mg total) by mouth 2 (two) times daily as needed (take one daily in am, second if BP >150/100).  Marland Kitchen co-enzyme Q-10 30 MG capsule Take 1 capsule (30 mg total) by mouth daily.  Marland Kitchen esomeprazole (NEXIUM) 40 MG capsule Take 1 capsule (40 mg total) by mouth daily.  . furosemide (LASIX) 20 MG tablet Take 1 tablet (20 mg total) by mouth daily as needed for edema.  . gabapentin (NEURONTIN) 300 MG capsule TAKE 1 CAPSULE THREE TIMES DAILY  . metoprolol tartrate (LOPRESSOR) 25 MG tablet TAKE 1 TABLET TWICE DAILY  . nystatin (MYCOSTATIN) powder APPLY TO THE AFFECTED AREA(S) TWICE DAILY  . ondansetron (ZOFRAN) 4 MG tablet TAKE 1 TABLET EVERY 12 HOURS AS NEEDED FOR NAUSEA  . oxybutynin (DITROPAN-XL) 10 MG 24 hr tablet TAKE 1 TABLET EVERY DAY  . potassium chloride (K-DUR) 10  MEQ tablet Take 1 tablet (10 mEq total) by mouth daily as needed (take with lasix).  . ranitidine (ZANTAC) 300 MG tablet Take 1 tablet (300 mg total) by mouth at bedtime.  . sucralfate (CARAFATE) 1 G tablet Take 1 tablet (1 g total) by mouth 2 (two) times daily.  . traMADol-acetaminophen (ULTRACET) 37.5-325 MG tablet TAKE 1 TABLET BY MOUTH EVERY 6 HOURS AS NEEDED FOR PAIN  . traZODone (DESYREL) 100 MG tablet TAKE 1/2 TO 1 TABLET AT BEDTIME  . VOLTAREN 1 % GEL APPLY 4 GRAMS TOPICALLY FOUR TIMES DAILY AS NEEDED.  . cholecalciferol 5000 UNITS TABS Take  5,000 Units by mouth daily. (Patient not taking: Reported on 12/14/2015)  . vitamin C (ASCORBIC ACID) 500 MG tablet Take 500 mg by mouth daily.  . [DISCONTINUED] pirbuterol (MAXAIR) 200 MCG/INH inhaler Inhale 2 puffs into the lungs 4 (four) times daily.   No current facility-administered medications on file prior to visit.     Review of Systems  Constitutional: Negative for activity change, appetite change, chills, fatigue, fever and unexpected weight change.  HENT: Negative for hearing loss.   Eyes: Negative for visual disturbance.  Respiratory: Negative for cough, chest tightness, shortness of breath and wheezing.   Cardiovascular: Negative for chest pain, palpitations and leg swelling.  Gastrointestinal: Negative for abdominal distention, abdominal pain, blood in stool, constipation, diarrhea, nausea and vomiting.  Genitourinary: Negative for difficulty urinating and hematuria.  Musculoskeletal: Negative for arthralgias, myalgias and neck pain.  Skin: Negative for rash.  Neurological: Negative for dizziness, seizures, syncope and headaches.  Hematological: Negative for adenopathy. Does not bruise/bleed easily.  Psychiatric/Behavioral: Negative for dysphoric mood. The patient is not nervous/anxious.    Per HPI unless specifically indicated in ROS section     Objective:    BP 140/90 (BP Location: Left Arm, Patient Position: Sitting, Cuff Size: Normal) Comment: has not taken BP medications  Pulse 74   Temp 98.7 F (37.1 C) (Oral)   Ht 5' (1.524 m)   Wt 191 lb (86.6 kg)   SpO2 96%   BMI 37.30 kg/m   Wt Readings from Last 3 Encounters:  04/21/16 191 lb (86.6 kg)  04/21/16 191 lb (86.6 kg)  12/14/15 196 lb 12 oz (89.2 kg)    Physical Exam  Constitutional: She is oriented to person, place, and time. She appears well-developed and well-nourished. No distress.  HENT:  Head: Normocephalic and atraumatic.  Right Ear: Hearing, tympanic membrane, external ear and ear canal normal.    Left Ear: Hearing, tympanic membrane, external ear and ear canal normal.  Nose: Nose normal.  Mouth/Throat: Uvula is midline, oropharynx is clear and moist and mucous membranes are normal. No oropharyngeal exudate, posterior oropharyngeal edema or posterior oropharyngeal erythema.  Eyes: Conjunctivae and EOM are normal. Pupils are equal, round, and reactive to light. No scleral icterus.  Neck: Normal range of motion. Neck supple. Carotid bruit is not present. No thyromegaly present.  Cardiovascular: Normal rate, regular rhythm, normal heart sounds and intact distal pulses.   No murmur heard. Pulses:      Radial pulses are 2+ on the right side, and 2+ on the left side.  Pulmonary/Chest: Effort normal and breath sounds normal. No respiratory distress. She has no wheezes. She has no rales.  Abdominal: Soft. Bowel sounds are normal. She exhibits no distension and no mass. There is no tenderness. There is no rebound and no guarding.  Musculoskeletal: Normal range of motion. She exhibits no edema.  Lymphadenopathy:  She has no cervical adenopathy.  Neurological: She is alert and oriented to person, place, and time.  CN grossly intact, station and gait intact  Skin: Skin is warm and dry. Rash noted.  Thickened plaques that turn into erosions on skin BLE with residual post inflammatory hyperpigmentation when healing.   Psychiatric: She has a normal mood and affect. Her behavior is normal. Judgment and thought content normal.  Nursing note and vitals reviewed.     Assessment & Plan:   Problem List Items Addressed This Visit    Advanced care planning/counseling discussion    Advanced directive: not assessed today. From past visit: has not set up. Has discussed with family her wishes. Would want Jannet Mantis daughter in [place] to be HCPOA. Wants full code but no prolonged life support if terminal.      Atherosclerosis of abdominal aorta (HCC)    Continue aspirin and lipitor 20mg  MWF       Relevant Medications   aspirin EC 81 MG tablet   CAD (coronary artery disease)    Continue aspirin and statin      Relevant Medications   aspirin EC 81 MG tablet   Health maintenance examination - Primary    Preventative protocols reviewed and updated unless pt declined. Discussed healthy diet and lifestyle.       HLD (hyperlipidemia)    Tolerating atorvastatin MWF + coq10.  Check FLP today.       Relevant Medications   aspirin EC 81 MG tablet   Other Relevant Orders   Lipid panel   HTN (hypertension)    Chronic, stable. Continue current regimen. Off amlodipine due to pedal edema.      Relevant Medications   aspirin EC 81 MG tablet   Other Relevant Orders   Basic metabolic panel   IDA (iron deficiency anemia)   Relevant Orders   CBC with Differential/Platelet   Increased endometrial stripe thickness    Pending f/u with Dr Leonides Schanz next month.       Mixed incontinence    Stable period on ditropan.      Nodular goiter, toxic or with hyperthyroidism    Last saw Dr Gabriel Carina 06/2014. Check TSH today. Discussed return to endo.      Relevant Orders   TSH   Pedal edema    Pt endorses this has improved off amlodipine.      Skin rash    Possible bacterial infection - treat with mupirocin. Update if not better with treatment.        Other Visit Diagnoses   None.      Follow up plan: Return in about 4 months (around 08/21/2016), or as needed, for follow up visit.  Ria Bush, MD

## 2016-04-21 NOTE — Patient Instructions (Addendum)
Brianna Woods , Thank you for taking time to come for your Medicare Wellness Visit. I appreciate your ongoing commitment to your health goals. Please review the following plan we discussed and let me know if I can assist you in the future.   These are the goals we discussed: Goals    . Increase physical activity          Starting 04/21/2016, I will continue to walk at least 30 min 3 days per week.        This is a list of the screening recommended for you and due dates:  Health Maintenance  Topic Date Due  . Flu Shot  09/10/2016*  . DTaP/Tdap/Td vaccine (1 - Tdap) 09/11/2016*  . Shingles Vaccine  04/21/2029*  . Tetanus Vaccine  09/11/2016  . DEXA scan (bone density measurement)  Completed  . Pneumonia vaccines  Completed  *Topic was postponed. The date shown is not the original due date.   Preventive Care for Adults  A healthy lifestyle and preventive care can promote health and wellness. Preventive health guidelines for adults include the following key practices.  . A routine yearly physical is a good way to check with your health care provider about your health and preventive screening. It is a chance to share any concerns and updates on your health and to receive a thorough exam.  . Visit your dentist for a routine exam and preventive care every 6 months. Brush your teeth twice a day and floss once a day. Good oral hygiene prevents tooth decay and gum disease.  . The frequency of eye exams is based on your age, health, family medical history, use  of contact lenses, and other factors. Follow your health care provider's ecommendations for frequency of eye exams.  . Eat a healthy diet. Foods like vegetables, fruits, whole grains, low-fat dairy products, and lean protein foods contain the nutrients you need without too many calories. Decrease your intake of foods high in solid fats, added sugars, and salt. Eat the right amount of calories for you. Get information about a proper diet from  your health care provider, if necessary.  . Regular physical exercise is one of the most important things you can do for your health. Most adults should get at least 150 minutes of moderate-intensity exercise (any activity that increases your heart rate and causes you to sweat) each week. In addition, most adults need muscle-strengthening exercises on 2 or more days a week.  Silver Sneakers may be a benefit available to you. To determine eligibility, you may visit the website: www.silversneakers.com or contact program at 925 529 5159 Mon-Fri between 8AM-8PM.   . Maintain a healthy weight. The body mass index (BMI) is a screening tool to identify possible weight problems. It provides an estimate of body fat based on height and weight. Your health care provider can find your BMI and can help you achieve or maintain a healthy weight.   For adults 20 years and older: ? A BMI below 18.5 is considered underweight. ? A BMI of 18.5 to 24.9 is normal. ? A BMI of 25 to 29.9 is considered overweight. ? A BMI of 30 and above is considered obese.   . Maintain normal blood lipids and cholesterol levels by exercising and minimizing your intake of saturated fat. Eat a balanced diet with plenty of fruit and vegetables. Blood tests for lipids and cholesterol should begin at age 71 and be repeated every 5 years. If your lipid or cholesterol levels are  high, you are over 50, or you are at high risk for heart disease, you may need your cholesterol levels checked more frequently. Ongoing high lipid and cholesterol levels should be treated with medicines if diet and exercise are not working.  . If you smoke, find out from your health care provider how to quit. If you do not use tobacco, please do not start.  . If you choose to drink alcohol, please do not consume more than 2 drinks per day. One drink is considered to be 12 ounces (355 mL) of beer, 5 ounces (148 mL) of wine, or 1.5 ounces (44 mL) of liquor.  . If you  are 60-35 years old, ask your health care provider if you should take aspirin to prevent strokes.  . Use sunscreen. Apply sunscreen liberally and repeatedly throughout the day. You should seek shade when your shadow is shorter than you. Protect yourself by wearing long sleeves, pants, a wide-brimmed hat, and sunglasses year round, whenever you are outdoors.  . Once a month, do a whole body skin exam, using a mirror to look at the skin on your back. Tell your health care provider of new moles, moles that have irregular borders, moles that are larger than a pencil eraser, or moles that have changed in shape or color.      Fall Prevention in the Home  Falls can cause injuries. They can happen to people of all ages. There are many things you can do to make your home safe and to help prevent falls.  WHAT CAN I DO ON THE OUTSIDE OF MY HOME?  Regularly fix the edges of walkways and driveways and fix any cracks.  Remove anything that might make you trip as you walk through a door, such as a raised step or threshold.  Trim any bushes or trees on the path to your home.  Use bright outdoor lighting.  Clear any walking paths of anything that might make someone trip, such as rocks or tools.  Regularly check to see if handrails are loose or broken. Make sure that both sides of any steps have handrails.  Any raised decks and porches should have guardrails on the edges.  Have any leaves, snow, or ice cleared regularly.  Use sand or salt on walking paths during winter.  Clean up any spills in your garage right away. This includes oil or grease spills. WHAT CAN I DO IN THE BATHROOM?   Use night lights.  Install grab bars by the toilet and in the tub and shower. Do not use towel bars as grab bars.  Use non-skid mats or decals in the tub or shower.  If you need to sit down in the shower, use a plastic, non-slip stool.  Keep the floor dry. Clean up any water that spills on the floor as soon as  it happens.  Remove soap buildup in the tub or shower regularly.  Attach bath mats securely with double-sided non-slip rug tape.  Do not have throw rugs and other things on the floor that can make you trip. WHAT CAN I DO IN THE BEDROOM?  Use night lights.  Make sure that you have a light by your bed that is easy to reach.  Do not use any sheets or blankets that are too big for your bed. They should not hang down onto the floor.  Have a firm chair that has side arms. You can use this for support while you get dressed.  Do not have  throw rugs and other things on the floor that can make you trip. WHAT CAN I DO IN THE KITCHEN?  Clean up any spills right away.  Avoid walking on wet floors.  Keep items that you use a lot in easy-to-reach places.  If you need to reach something above you, use a strong step stool that has a grab bar.  Keep electrical cords out of the way.  Do not use floor polish or wax that makes floors slippery. If you must use wax, use non-skid floor wax.  Do not have throw rugs and other things on the floor that can make you trip. WHAT CAN I DO WITH MY STAIRS?  Do not leave any items on the stairs.  Make sure that there are handrails on both sides of the stairs and use them. Fix handrails that are broken or loose. Make sure that handrails are as long as the stairways.  Check any carpeting to make sure that it is firmly attached to the stairs. Fix any carpet that is loose or worn.  Avoid having throw rugs at the top or bottom of the stairs. If you do have throw rugs, attach them to the floor with carpet tape.  Make sure that you have a light switch at the top of the stairs and the bottom of the stairs. If you do not have them, ask someone to add them for you. WHAT ELSE CAN I DO TO HELP PREVENT FALLS?  Wear shoes that:  Do not have high heels.  Have rubber bottoms.  Are comfortable and fit you well.  Are closed at the toe. Do not wear sandals.  If  you use a stepladder:  Make sure that it is fully opened. Do not climb a closed stepladder.  Make sure that both sides of the stepladder are locked into place.  Ask someone to hold it for you, if possible.  Clearly mark and make sure that you can see:  Any grab bars or handrails.  First and last steps.  Where the edge of each step is.  Use tools that help you move around (mobility aids) if they are needed. These include:  Canes.  Walkers.  Scooters.  Crutches.  Turn on the lights when you go into a dark area. Replace any light bulbs as soon as they burn out.  Set up your furniture so you have a clear path. Avoid moving your furniture around.  If any of your floors are uneven, fix them.  If there are any pets around you, be aware of where they are.  Review your medicines with your doctor. Some medicines can make you feel dizzy. This can increase your chance of falling. Ask your doctor what other things that you can do to help prevent falls.   This information is not intended to replace advice given to you by your health care provider. Make sure you discuss any questions you have with your health care provider.   Document Released: 06/24/2009 Document Revised: 01/12/2015 Document Reviewed: 10/02/2014 Elsevier Interactive Patient Education [employer].

## 2016-04-21 NOTE — Assessment & Plan Note (Signed)
Preventative protocols reviewed and updated unless pt declined. Discussed healthy diet and lifestyle.  

## 2016-04-21 NOTE — Progress Notes (Signed)
Pre visit review using our clinic review tool, if applicable. No additional management support is needed unless otherwise documented below in the visit note. 

## 2016-04-21 NOTE — Progress Notes (Signed)
Subjective:   Brianna Woods is a 81 y.o. female who presents for Medicare Annual (Subsequent) preventive examination.  Review of Systems:  N/A Cardiac Risk Factors include: advanced age (>28men, >37 women);obesity (BMI >30kg/m2);dyslipidemia;hypertension     Objective:     Vitals: BP 140/90 (BP Location: Left Arm, Patient Position: Sitting, Cuff Size: Normal) Comment: has not taken BP medications  Pulse 74   Temp 98.7 F (37.1 C) (Oral)   Ht 5' (1.524 m)   Wt 191 lb (86.6 kg)   SpO2 96%   BMI 37.30 kg/m   Body mass index is 37.3 kg/m.   Tobacco History  Smoking Status  . Never Smoker  Smokeless Tobacco  . Never Used     Counseling given: No   Past Medical History:  Diagnosis Date  . Anemia   . Arthritis   . Asthma    per pt  . Atherosclerosis of abdominal aorta ([place]) 07/2015   by CT  . Barrett's esophagus    on EGD 2008, EGD WNL 2013  . CAD (coronary artery disease) 07/2015   of LAD by CT  . Diverticulosis 2014   sigmoid on colonoscopy  . Fatty pancreas ([place]) 07/2015   by CT  . Fibromyalgia    per pt, no records of this   . GERD (gastroesophageal reflux disease)    and esoph stricture s/p dilation 2008 HH by CT 2016  . History of anxiety    was on prozac then effexor (pt denies h/o anxiety/depression)  . History of cardiac murmur   . History of chicken pox   . History of CVA (cerebrovascular accident) 2008   "I've had several TIAs"  . History of right bundle branch block 2011  . History of ulcer disease    per pt, no records of this  . HLD (hyperlipidemia)   . HTN (hypertension)   . Kidney cyst, acquired 07/2015   by CT, rec rpt renal MRI in 6 months  . Mixed incontinence    on ditropan  . Nephrolithiasis    s/p surgery R kidney (72mm) 11/2009  . Nodular goiter, toxic or with hyperthyroidism    h/o toxic, on methimazole, known large left thyroid nodule 08/2013 s/p beign biopsy per patient 2010, saw Dr. Gabriel Carina, Rad I ablation 04/2014  . Vitamin D  deficiency    Past Surgical History:  Procedure Laterality Date  . CATARACT EXTRACTION  1990   w/ implants  . CHOLECYSTECTOMY  2006  . COLONOSCOPY  10/02/12   diverticulosis, o/w WNL ([place])  . dexa     no records received  . ESOPHAGOGASTRODUODENOSCOPY  11/2011   LA grade A esophagitis lower 1/3, dilated, med HH, o/w WNL - path: + GERD, no barrett's - f/u 11/2016  . LITHOTRIPSY Right 2011  . thyroid uptake scan  04/2014   unifrm uptake, enlarged thyroid consistent with grave's dz   Family History  Problem Relation Age of Onset  . Hyperlipidemia Mother   . Hypertension Mother   . Stroke Father     hemorrhage  . Aneurysm Father 5    deceased  . Other Brother     TB  . Coronary artery disease Brother   . Aneurysm Son 58    sudden death  . Diabetes Maternal Aunt   . Cancer Brother 41    lung   History  Sexual Activity  . Sexual activity: No    Outpatient Encounter Prescriptions as of 04/21/2016  Medication Sig  . albuterol (  VENTOLIN HFA) 108 (90 BASE) MCG/ACT inhaler Inhale 2 puffs into the lungs every 6 (six) hours as needed. for wheezing  . atorvastatin (LIPITOR) 40 MG tablet Take 1 tablet (40 mg total) by mouth every Monday, Wednesday, and Friday.  . cloNIDine (CATAPRES) 0.1 MG tablet Take 1 tablet (0.1 mg total) by mouth 2 (two) times daily as needed (take one daily in am, second if BP >150/100).  Marland Kitchen co-enzyme Q-10 30 MG capsule Take 1 capsule (30 mg total) by mouth daily.  Marland Kitchen esomeprazole (NEXIUM) 40 MG capsule Take 1 capsule (40 mg total) by mouth daily.  . furosemide (LASIX) 20 MG tablet Take 1 tablet (20 mg total) by mouth daily as needed for edema.  . gabapentin (NEURONTIN) 300 MG capsule TAKE 1 CAPSULE THREE TIMES DAILY  . metoprolol tartrate (LOPRESSOR) 25 MG tablet TAKE 1 TABLET TWICE DAILY  . nystatin (MYCOSTATIN) powder APPLY TO THE AFFECTED AREA(S) TWICE DAILY  . ondansetron (ZOFRAN) 4 MG tablet TAKE 1 TABLET EVERY 12 HOURS AS NEEDED FOR NAUSEA  . oxybutynin  (DITROPAN-XL) 10 MG 24 hr tablet TAKE 1 TABLET EVERY DAY  . potassium chloride (K-DUR) 10 MEQ tablet Take 1 tablet (10 mEq total) by mouth daily as needed (take with lasix).  . ranitidine (ZANTAC) 300 MG tablet Take 1 tablet (300 mg total) by mouth at bedtime.  . sucralfate (CARAFATE) 1 G tablet Take 1 tablet (1 g total) by mouth 2 (two) times daily.  . traMADol-acetaminophen (ULTRACET) 37.5-325 MG tablet TAKE 1 TABLET BY MOUTH EVERY 6 HOURS AS NEEDED FOR PAIN  . traZODone (DESYREL) 100 MG tablet TAKE 1/2 TO 1 TABLET AT BEDTIME  . vitamin C (ASCORBIC ACID) 500 MG tablet Take 500 mg by mouth daily.  . VOLTAREN 1 % GEL APPLY 4 GRAMS TOPICALLY FOUR TIMES DAILY AS NEEDED.  . [DISCONTINUED] conjugated estrogens (PREMARIN) vaginal cream APPLY 1/4 APPLICATORFUL VAGINALLY TWICE WEEKLY  . [DISCONTINUED] ferrous sulfate 325 (65 FE) MG tablet Take 1 tablet (325 mg total) by mouth daily with breakfast.  . [DISCONTINUED] levofloxacin (LEVAQUIN) 500 MG tablet Take 1 tablet (500 mg total) by mouth daily.  . [DISCONTINUED] ondansetron (ZOFRAN-ODT) 4 MG disintegrating tablet Take 1 tablet (4 mg total) by mouth 2 (two) times daily as needed for nausea or vomiting.  . cholecalciferol 5000 UNITS TABS Take 5,000 Units by mouth daily. (Patient not taking: Reported on 12/14/2015)  . [DISCONTINUED] amLODipine (NORVASC) 10 MG tablet Take 1 tablet (10 mg total) by mouth daily. (Patient not taking: Reported on 12/14/2015)   No facility-administered encounter medications on file as of 04/21/2016.     Activities of Daily Living In your present state of health, do you have any difficulty performing the following activities: 04/21/2016  Hearing? N  Vision? Y  Difficulty concentrating or making decisions? N  Walking or climbing stairs? Y  Dressing or bathing? N  Doing errands, shopping? Y  Preparing Food and eating ? N  Using the Toilet? N  In the past six months, have you accidently leaked urine? N  Do you have problems  with loss of bowel control? N  Managing your Medications? N  Managing your Finances? N  Housekeeping or managing your Housekeeping? N  Some recent data might be hidden    Patient Care Team: Ria Bush, MD as PCP - General (Family Medicine) Maceo Pro, MD as Referring Physician (Obstetrics and Gynecology)    Assessment:     Hearing Screening   125Hz  250Hz  500Hz  1000Hz  2000Hz   3000Hz  4000Hz  6000Hz  8000Hz   Right ear:   40 0 40  0    Left ear:   40 40 40  0      Visual Acuity Screening   Right eye Left eye Both eyes  Without correction:     With correction: 20/50 20/100 20/40    Exercise Activities and Dietary recommendations Current Exercise Habits: Home exercise routine, Type of exercise: walking, Time (Minutes): 30, Frequency (Times/Week): 3, Weekly Exercise (Minutes/Week): 90, Intensity: Mild, Exercise limited by: None identified  Goals    . Increase physical activity          Starting 04/21/2016, I will continue to walk at least 30 min 3 days per week.       Fall Risk Fall Risk  04/21/2016 03/22/2015 03/22/2015 03/20/2014  Falls in the past year? Yes - Yes Yes  Number falls in past yr: 2 or more - 2 or more 2 or more  Injury with Fall? No Yes No -  Risk Factor Category  High Fall Risk - High Fall Risk High Fall Risk  Risk for fall due to : Impaired mobility - Impaired balance/gait;Impaired mobility Impaired balance/gait;Impaired mobility  Follow up Falls evaluation completed;Education provided - - -   Depression Screen PHQ 2/9 Scores 04/21/2016 03/22/2015 03/20/2014  PHQ - 2 Score 0 0 0     Cognitive Testing MMSE - Mini Mental State Exam 04/21/2016  Orientation to time 5  Orientation to Place 5  Registration 3  Attention/ Calculation 0  Recall 3  Language- name 2 objects 0  Language- repeat 1  Language- follow 3 step command 3  Language- read & follow direction 0  Write a sentence 0  Copy design 0  Total score 20   PLEASE NOTE: A Mini-Cog screen was  completed. Maximum score is 20. A value of 0 denotes this part of [place]stein MMSE was not completed or the patient failed this part of the Mini-Cog screening.   Mini-Cog Screening Orientation to Time - Max 5 pts Orientation to Place - Max 5 pts Registration - Max 3 pts Recall - Max 3 pts Language Repeat - Max 1 pts Language Follow 3 Step Command - Max 3 pts  Immunization History  Administered Date(s) Administered  . Influenza,inj,Quad PF,36+ Mos 08/15/2013, 09/21/2014, 07/26/2015  . Pneumococcal Conjugate-13 03/20/2014  . Pneumococcal Polysaccharide-23 09/11/2008  . Td 09/11/2006   Screening Tests Health Maintenance  Topic Date Due  . INFLUENZA VACCINE  09/10/2016 (Originally 04/11/2016)  . DTaP/Tdap/Td (1 - Tdap) 09/11/2016 (Originally 09/12/2006)  . ZOSTAVAX  04/21/2029 (Originally 12/06/1996)  . TETANUS/TDAP  09/11/2016  . DEXA SCAN  Completed  . PNA vac Low Risk Adult  Completed      Plan:     I have personally reviewed and addressed the Medicare Annual Wellness questionnaire and have noted the following in the patient's chart:  A. Medical and social history B. Use of alcohol, tobacco or illicit drugs  C. Current medications and supplements D. Functional ability and status E.  Nutritional status F.  Physical activity G. Advance directives H. List of other physicians I.  Hospitalizations, surgeries, and ER visits in previous 12 months J.  [place] to include hearing, vision, cognitive, depression L. Referrals and appointments - none  In addition, I have reviewed and discussed with patient certain preventive protocols, quality metrics, and best practice recommendations. A written personalized care plan for preventive services as well as general preventive health recommendations were provided to patient.  See attached scanned questionnaire for additional information.   Signed,   Lindell Noe, MHA, BS, LPN Health Advisor

## 2016-04-22 NOTE — Progress Notes (Signed)
I reviewed health advisor's note, was available for consultation, and agree with documentation and plan.  

## 2016-04-24 ENCOUNTER — Telehealth: Payer: Self-pay | Admitting: Family Medicine

## 2016-04-24 DIAGNOSIS — H547 Unspecified visual loss: Secondary | ICD-10-CM

## 2016-04-24 NOTE — Telephone Encounter (Signed)
Referral placed.

## 2016-04-24 NOTE — Telephone Encounter (Signed)
Pt would like referral to opthalmologist.  Best number to call is [phone]

## 2016-04-27 ENCOUNTER — Other Ambulatory Visit: Payer: Self-pay | Admitting: Family Medicine

## 2016-04-27 NOTE — Telephone Encounter (Signed)
Ok to refill? Last filled 03/27/16 #60 0RF 

## 2016-04-28 NOTE — Telephone Encounter (Signed)
Rx called in as directed.   

## 2016-04-28 NOTE — Telephone Encounter (Signed)
plz phone in. 

## 2016-05-04 IMAGING — US US PELVIS COMPLETE
1 series · 14 of 25 positions shown · non-contrast
Comparison: Pelvis CT dated [DATE].

CLINICAL DATA: Thickened endometrium on a recent pelvis CT.

EXAM:
TRANSABDOMINAL ULTRASOUND OF PELVIS
TECHNIQUE: Transabdominal ultrasound examination of the pelvis was performed
including evaluation of the uterus, ovaries, adnexal regions, and
pelvic cul-de-sac.

[Series 1: us pelvis complete · 0.24mm/px · 14 of 33 slices shown]
[im 1/33]
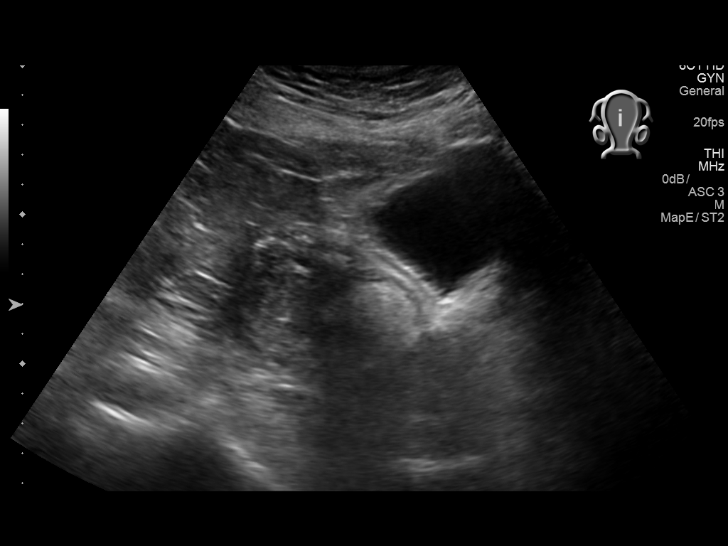
[im 3/33]
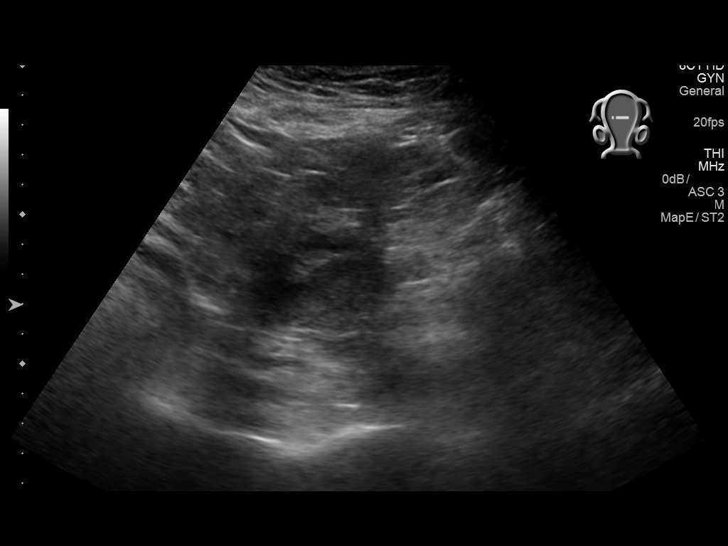
[im 6/33]
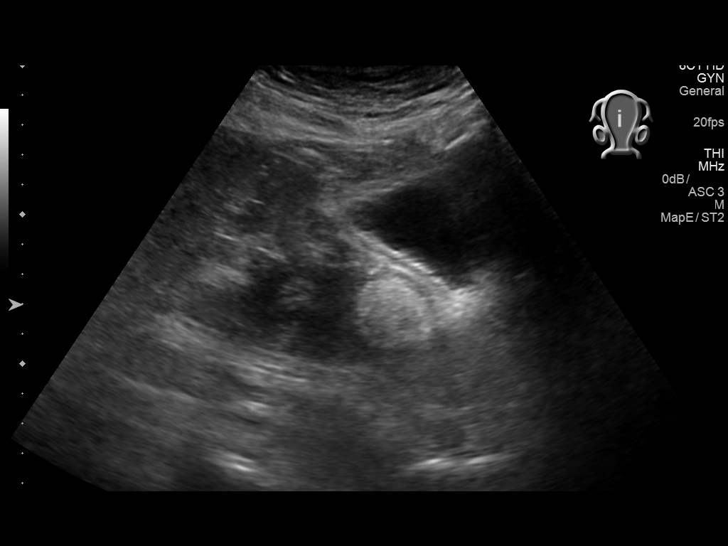
[im 9/33]
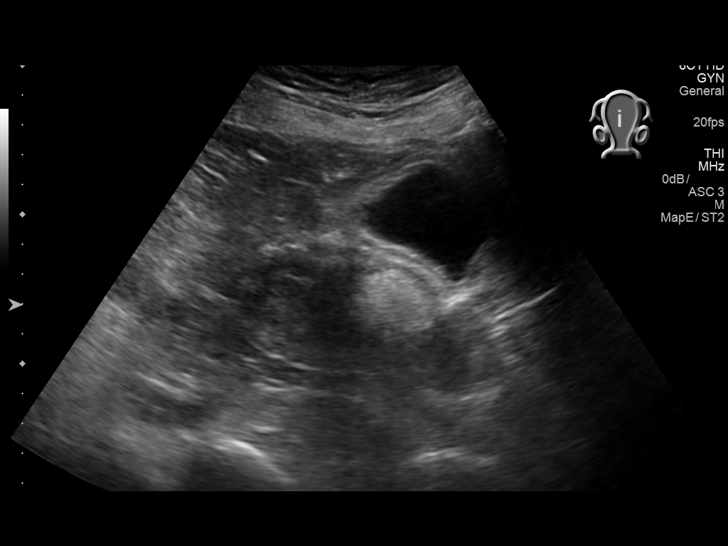
[im 11/33]
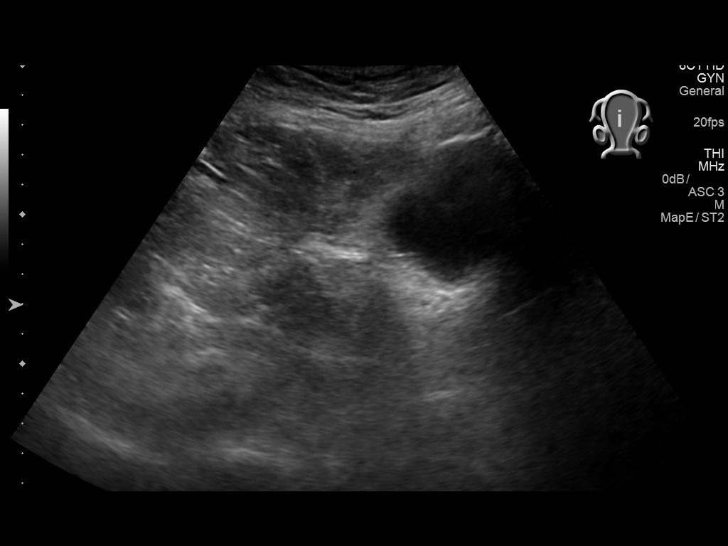
[im 13/33]
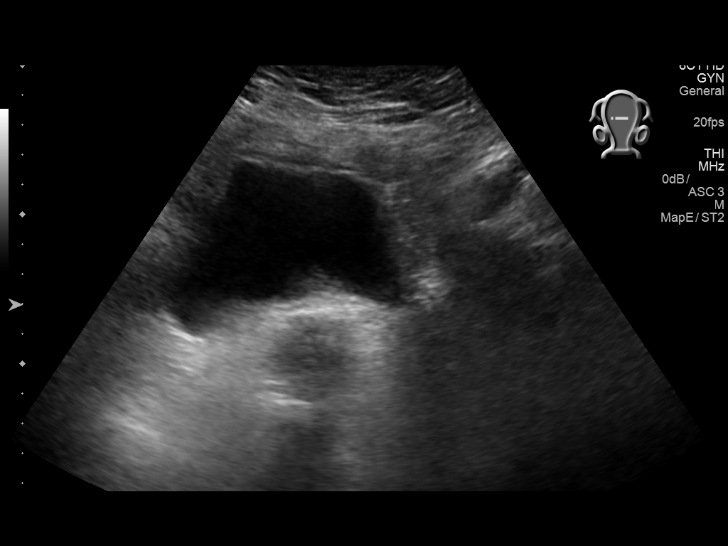
[im 15/33]
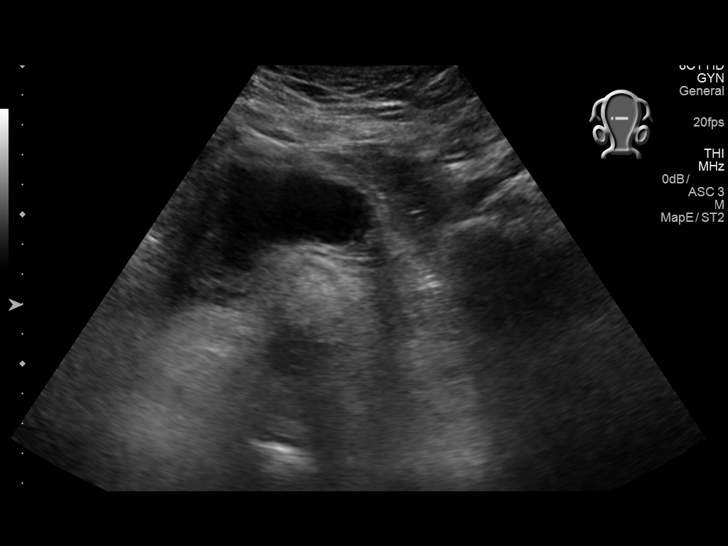
[im 18/33]
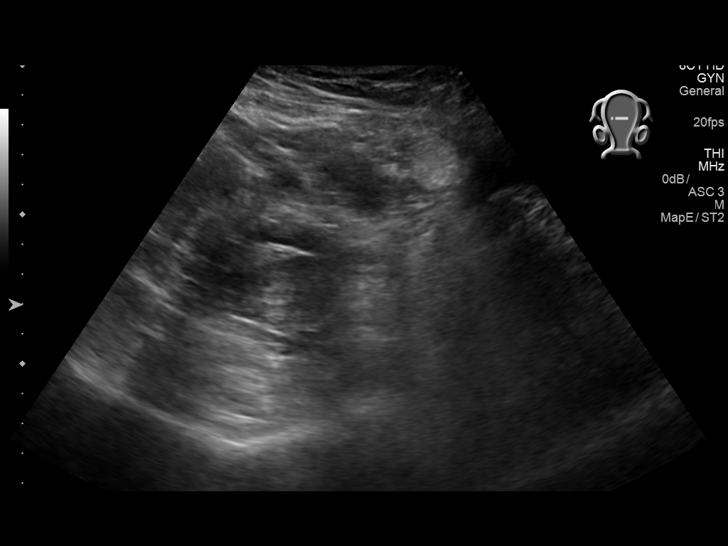
[im 21/33]
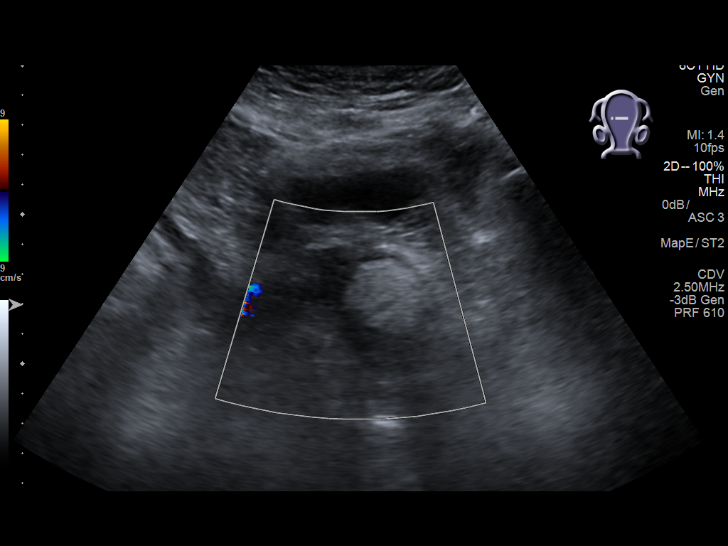
[im 22/33]
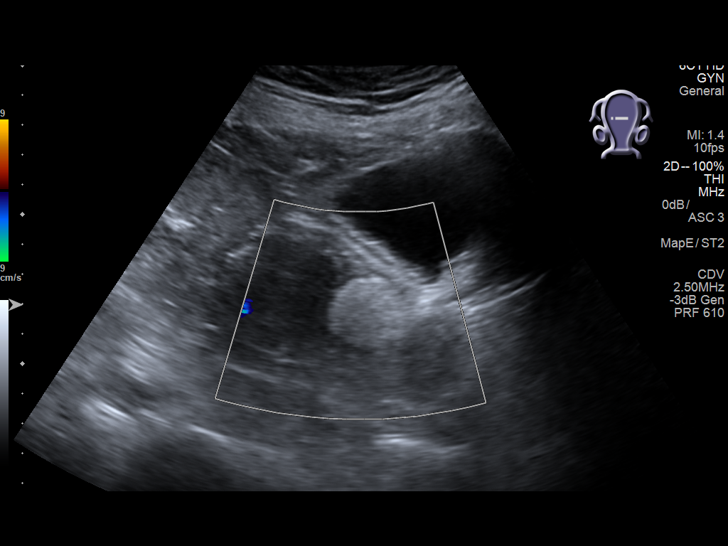
[im 25/33]
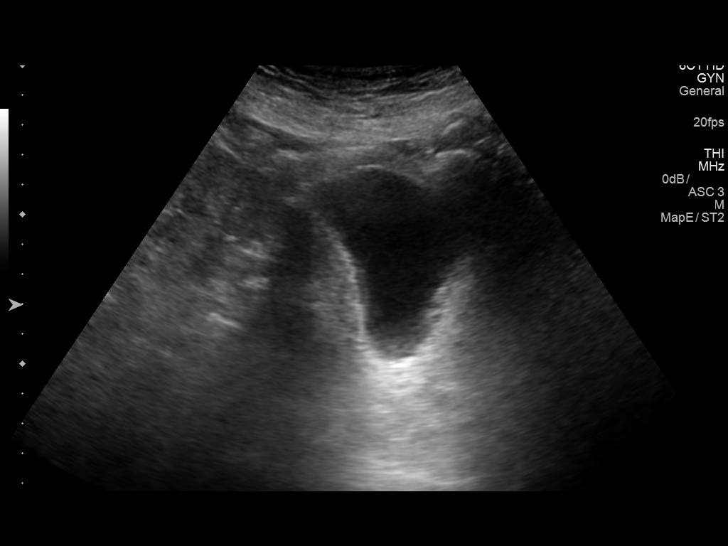
[im 27/33]
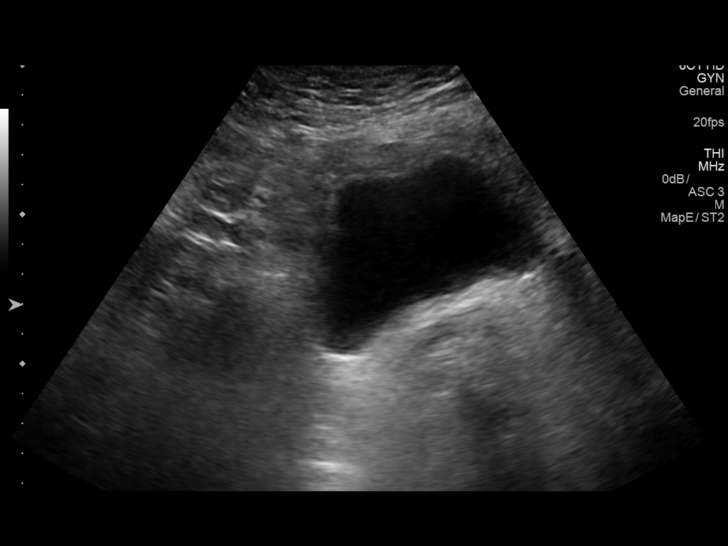
[im 30/33]
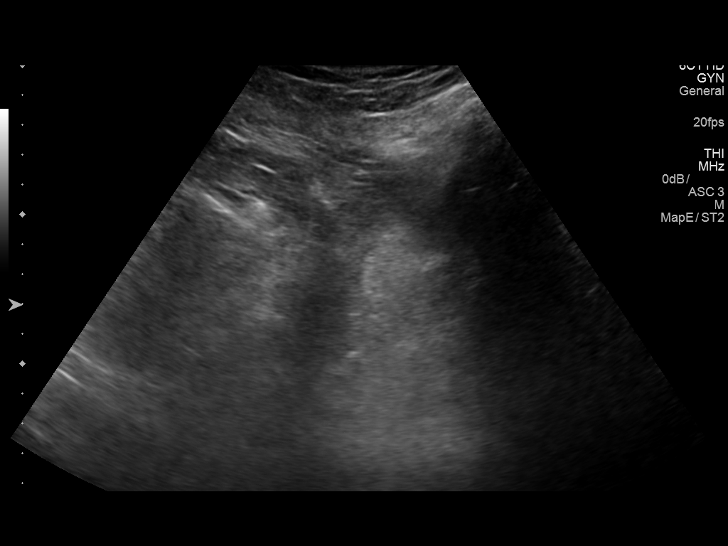
[im 33/33]
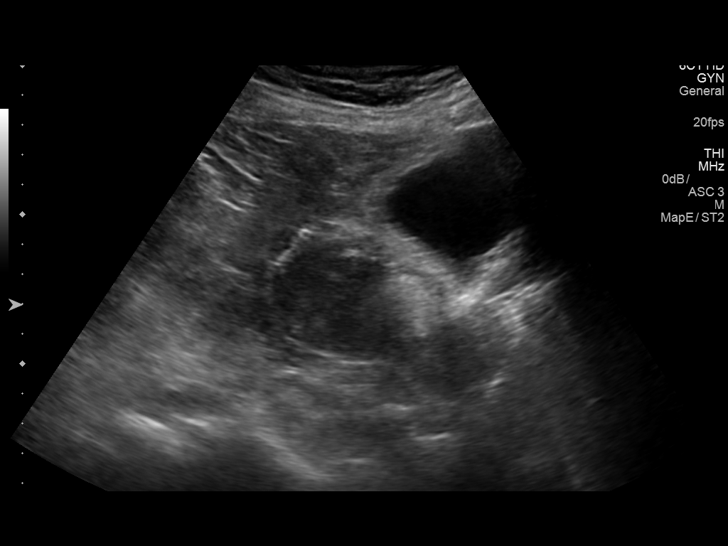

[14 of 25 positions shown; findings below may reference images not displayed]

FINDINGS: Uterus

Measurements: Poorly defined, measuring approximately 9.3 x 4.9 x
4.3 cm. The uterus is heterogeneous. There is an oval, eccentric,
hyperechoic mass inferiorly in the anterior uterus. This mass
measures 3.0 x 2.7 x 2.5 cm.

Endometrium

Thickness: Not well defined..  No focal abnormality visualized.

Right ovary

Not visualized.

Left ovary

Not visualized.

Other findings:  No abnormal free fluid.
IMPRESSION: 1. Without transvaginal images, the uterus is not adequately
visualized for detailed assessment. There is a least 1 oval,
echogenic mass in the lower uterine segment anteriorly, measuring
3.0 x 2.7 x 2.5 cm. The endometrial stripe is not adequately
visualized. A transvaginal pelvic ultrasound or pre and postcontrast
magnetic resonance imaging of the pelvis would provide better
detail.
2. Nonvisualized ovaries.

## 2016-05-11 IMAGING — US US TRANSVAGINAL NON-OB
1 series · 13 of 25 positions shown · non-contrast
Comparison: Transvaginal images [DATE] and CT scan of the
abdomen and pelvis [DATE]

CLINICAL DATA: Recent transvaginal correction trans abdominal
ultrasound revealed thickness increased thickness of the endometrial
stripe and a prominent uterine leiomyoma ; patient has agreed to
undergo transvaginal imaging.

EXAM:
ULTRASOUND PELVIS TRANSVAGINAL
TECHNIQUE: Transvaginal ultrasound examination of the pelvis was performed
including evaluation of the uterus, ovaries, adnexal regions, and
pelvic cul-de-sac.

[Series 1: us transvaginal non-ob · 0.12mm/px · 13 of 51 slices shown]
[im 1/51]
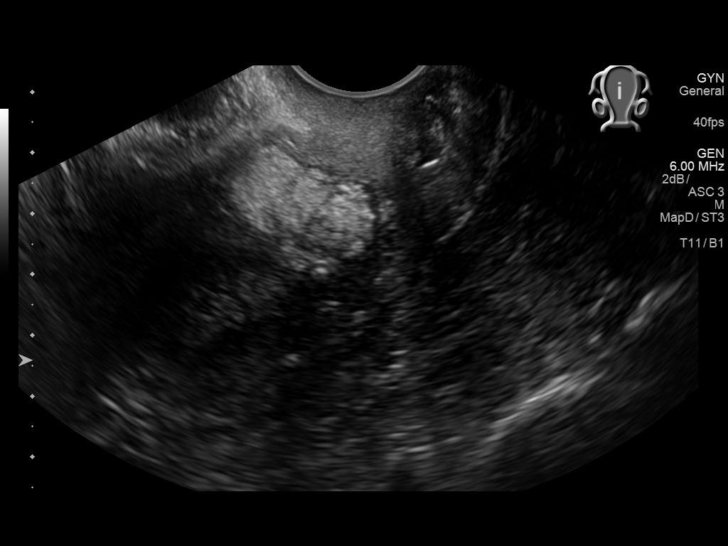
[im 5/51]
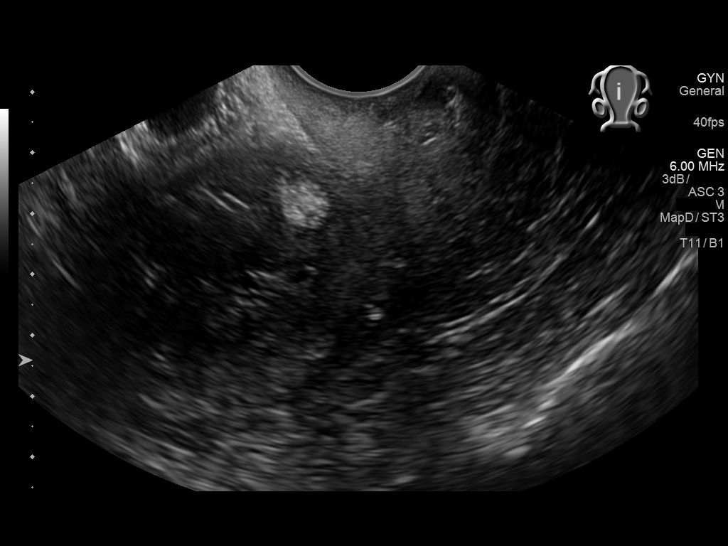
[im 9/51]
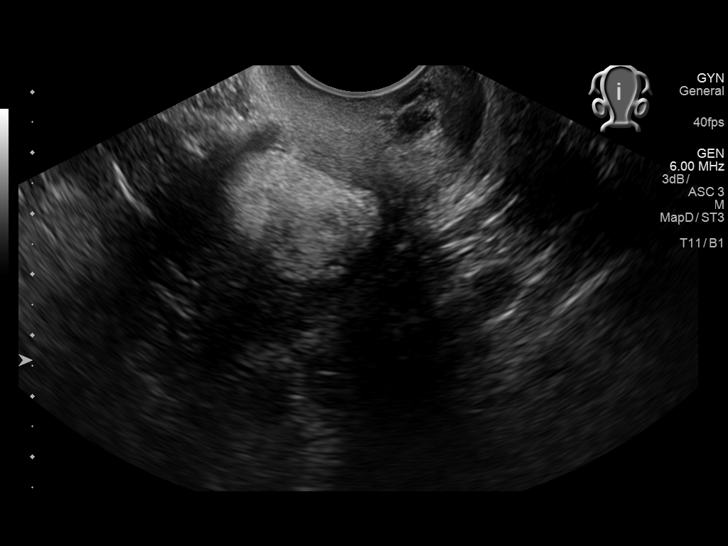
[im 13/51]
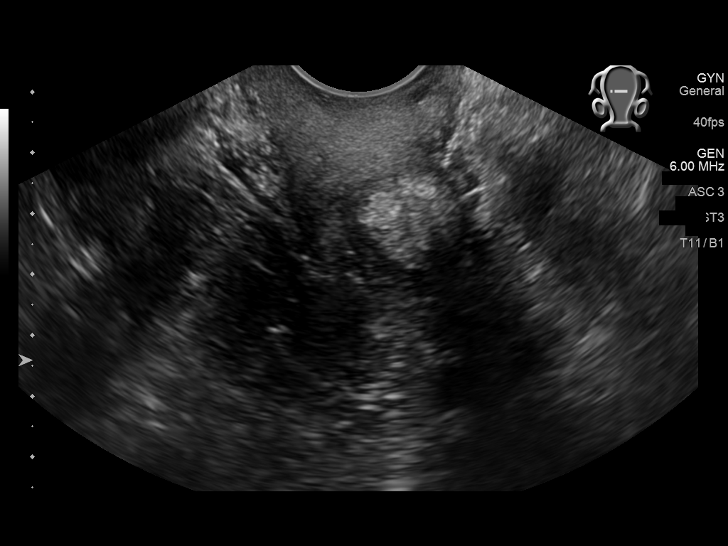
[im 17/51]
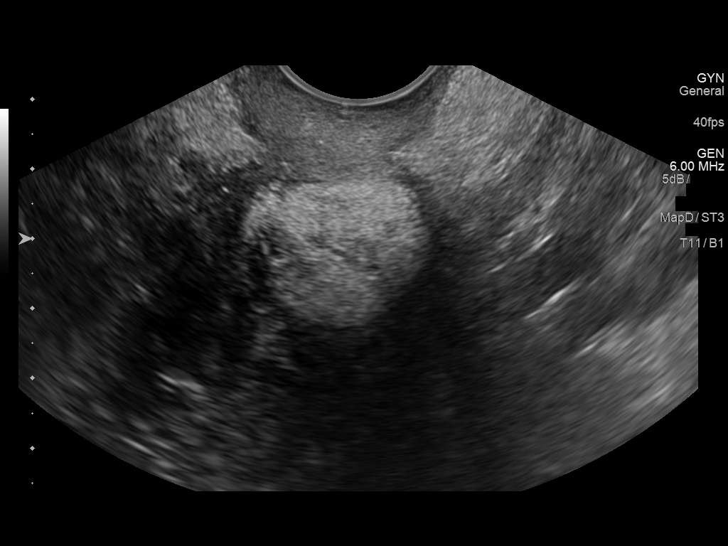
[im 21/51]
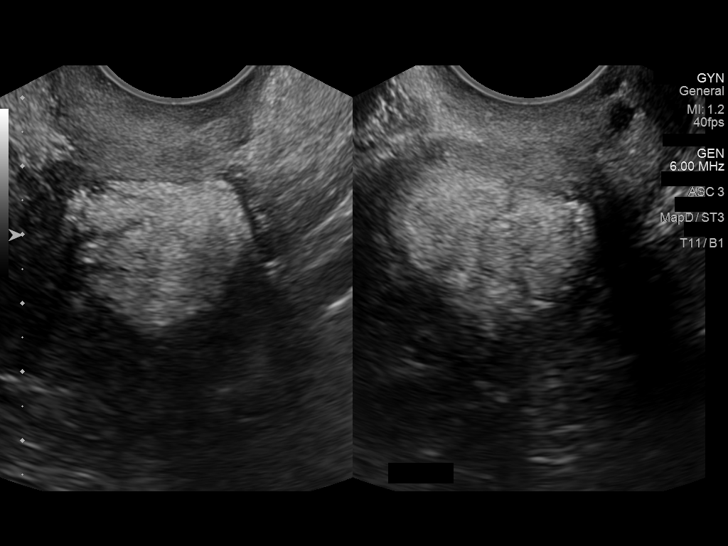
[im 26/51]
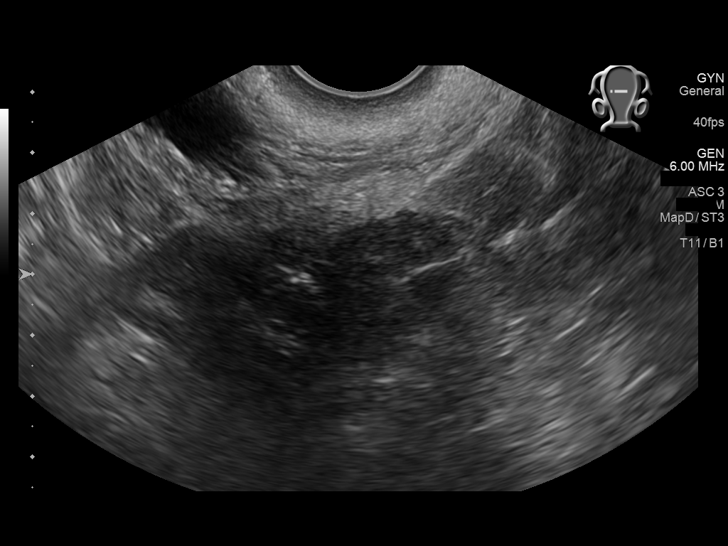
[im 30/51]
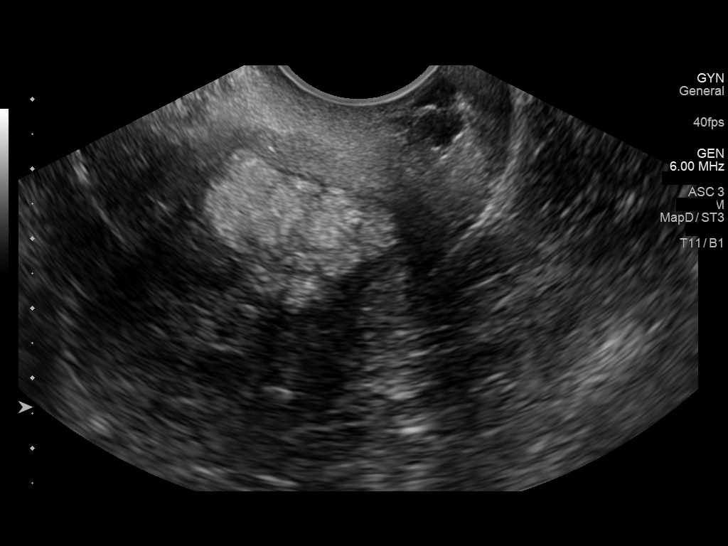
[im 34/51]
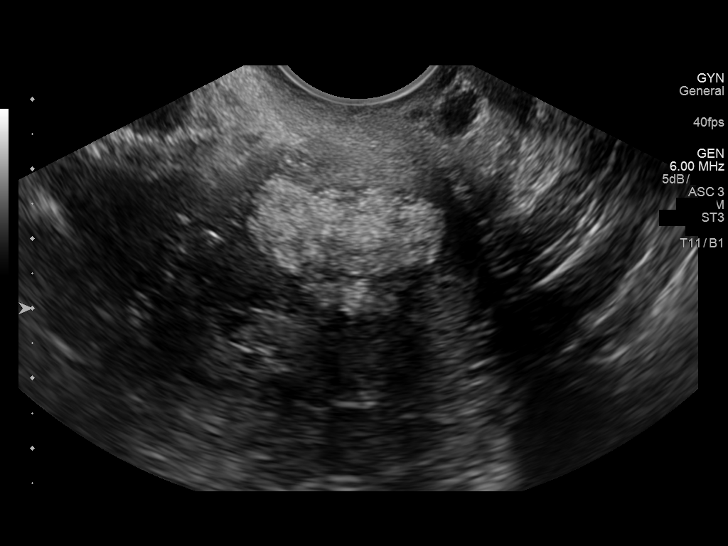
[im 38/51]
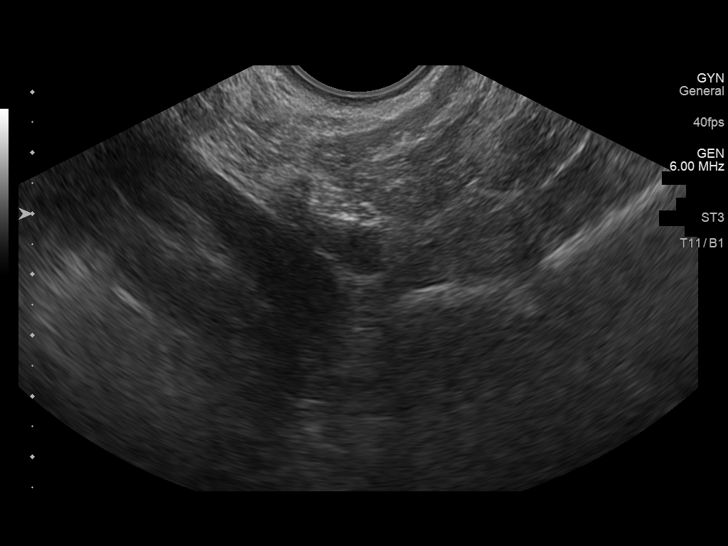
[im 42/51]
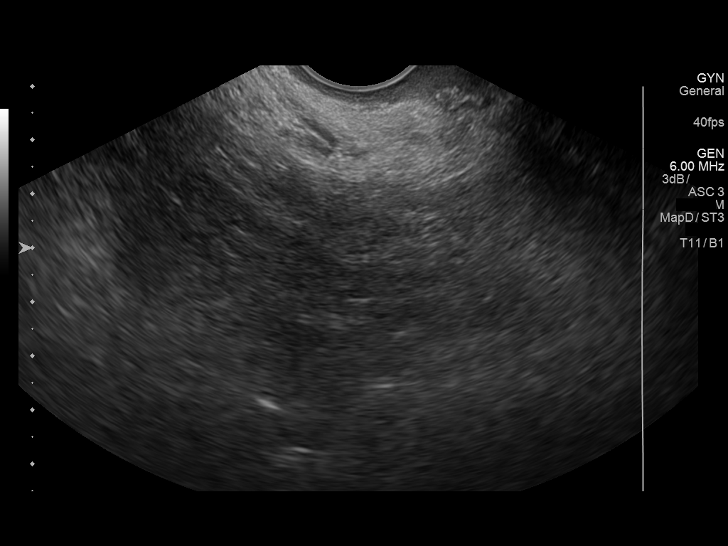
[im 46/51]
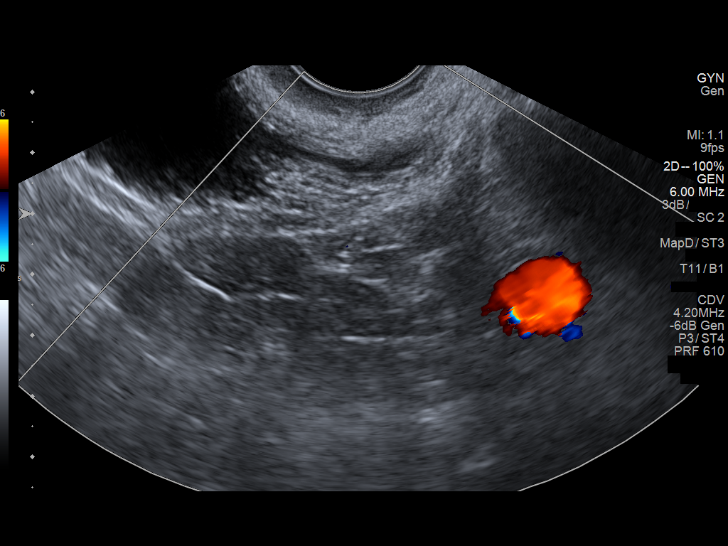
[im 51/51]
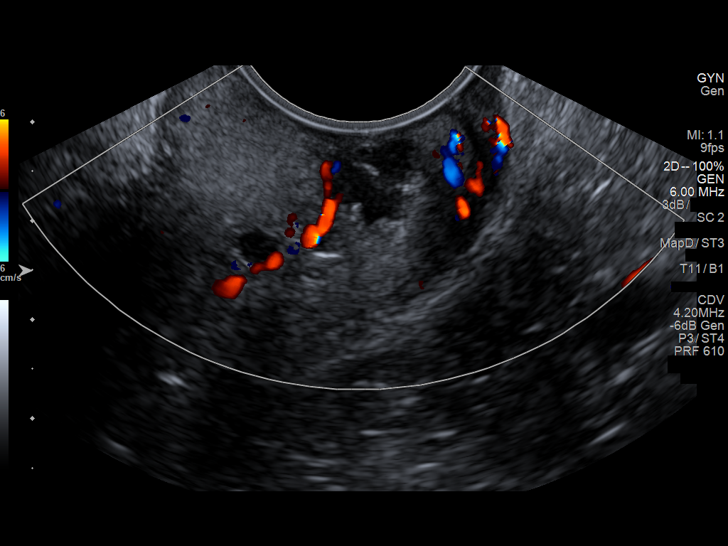

[13 of 25 positions shown; findings below may reference images not displayed]

FINDINGS: Uterus

Measurements: 8.2 x 4.4 x 4.5 cm. In the anterior aspect of the
uterine corpus extending from the endometrium to the serosa there is
an echogenic focus measuring 2.6 x 2.3 x 3.1 cm. It is not hyper
vascular. There are nabothian cysts.

Endometrium

Thickness: 11.2 cm. Within the endometrium there are a few anechoic
structures measuring

The ovaries could not be visualized.

Other findings:  No abnormal free fluid
IMPRESSION: 1. Dominant hyperechoic focus in the anterior aspect of the uterine
corpus extending from the endometrium to the serosa most compatible
with a fibroid measuring 3.1 cm in greatest dimension.
2. Thickening of the endometrial stripe to 11.2 mm. Small fluid
collections or tiny cysts within the endometrium are observed.
Consideration for endometrial sampling is recommended.
3. Nonvisualization of the ovaries which is not unusual at the
patient's age.

## 2016-05-25 ENCOUNTER — Other Ambulatory Visit: Payer: Self-pay | Admitting: Family Medicine

## 2016-05-25 NOTE — Telephone Encounter (Signed)
Ok to refill? Last filled 02/07/16 #40 2 RF

## 2016-05-29 ENCOUNTER — Other Ambulatory Visit: Payer: Self-pay | Admitting: Family Medicine

## 2016-05-29 NOTE — Telephone Encounter (Signed)
Ok to refill? Last filled 09/22/15 #90 0RF

## 2016-06-21 ENCOUNTER — Other Ambulatory Visit: Payer: Self-pay | Admitting: Family Medicine

## 2016-06-21 NOTE — Telephone Encounter (Signed)
plz phone in. 

## 2016-06-21 NOTE — Telephone Encounter (Signed)
Ok to refill? Last filled 04/28/16 #60 0RF

## 2016-06-22 NOTE — Telephone Encounter (Signed)
Rx called in as directed.   

## 2016-07-13 ENCOUNTER — Other Ambulatory Visit: Payer: Self-pay | Admitting: Family Medicine

## 2016-07-30 ENCOUNTER — Other Ambulatory Visit: Payer: Self-pay | Admitting: Family Medicine

## 2016-07-31 NOTE — Telephone Encounter (Signed)
plz phone in. 

## 2016-07-31 NOTE — Telephone Encounter (Signed)
Ok to refill? Last filled 06/21/16 #60 0RF

## 2016-08-01 ENCOUNTER — Other Ambulatory Visit: Payer: Self-pay | Admitting: Family Medicine

## 2016-08-01 NOTE — Telephone Encounter (Signed)
Called in to [id number] - [place], [place] [address] [phone]  

## 2016-08-21 ENCOUNTER — Ambulatory Visit: Payer: Medicare PPO | Admitting: Family Medicine

## 2016-08-28 ENCOUNTER — Ambulatory Visit: Payer: Medicare PPO | Admitting: Family Medicine

## 2016-09-19 ENCOUNTER — Ambulatory Visit: Payer: Medicare PPO | Admitting: Family Medicine

## 2016-09-22 ENCOUNTER — Ambulatory Visit (INDEPENDENT_AMBULATORY_CARE_PROVIDER_SITE_OTHER): Payer: Medicare PPO | Admitting: Family Medicine

## 2016-09-22 ENCOUNTER — Encounter: Payer: Self-pay | Admitting: Family Medicine

## 2016-09-22 VITALS — BP 126/82 | HR 84 | Temp 98.5°F | Wt 186.5 lb

## 2016-09-22 DIAGNOSIS — E0520 Thyrotoxicosis with toxic multinodular goiter without thyrotoxic crisis or storm: Secondary | ICD-10-CM

## 2016-09-22 DIAGNOSIS — M797 Fibromyalgia: Secondary | ICD-10-CM | POA: Diagnosis not present

## 2016-09-22 DIAGNOSIS — I2510 Atherosclerotic heart disease of native coronary artery without angina pectoris: Secondary | ICD-10-CM

## 2016-09-22 DIAGNOSIS — I700 Atherosclerosis of aorta: Secondary | ICD-10-CM

## 2016-09-22 DIAGNOSIS — K2100 Gastro-esophageal reflux disease with esophagitis, without bleeding: Secondary | ICD-10-CM

## 2016-09-22 DIAGNOSIS — R9389 Abnormal findings on diagnostic imaging of other specified body structures: Secondary | ICD-10-CM

## 2016-09-22 DIAGNOSIS — E785 Hyperlipidemia, unspecified: Secondary | ICD-10-CM

## 2016-09-22 DIAGNOSIS — R938 Abnormal findings on diagnostic imaging of other specified body structures: Secondary | ICD-10-CM

## 2016-09-22 DIAGNOSIS — R1084 Generalized abdominal pain: Secondary | ICD-10-CM

## 2016-09-22 DIAGNOSIS — I10 Essential (primary) hypertension: Secondary | ICD-10-CM

## 2016-09-22 NOTE — Assessment & Plan Note (Signed)
Continue aspirin, statin.  

## 2016-09-22 NOTE — Assessment & Plan Note (Signed)
Overall stable period. No longer bothering her.

## 2016-09-22 NOTE — Progress Notes (Signed)
Pre visit review using our clinic review tool, if applicable. No additional management support is needed unless otherwise documented below in the visit note. 

## 2016-09-22 NOTE — Patient Instructions (Addendum)
Continue voltaren gel as well as try vicks vaporub or other over the counter rubbing medicine. Let me know how this help.  Consider returning to see Dr Gabriel Carina and Dr Leonides Schanz.  Return to see me in 7 months for physical.  You are doing well! Nice to see you today.

## 2016-09-22 NOTE — Assessment & Plan Note (Signed)
Encouraged f/u with Dr Leonides Schanz.

## 2016-09-22 NOTE — Assessment & Plan Note (Signed)
Tolerating atorvastatin MWF. Has stopped coq10

## 2016-09-22 NOTE — Assessment & Plan Note (Signed)
Continue nexium, zantac, decrease carafate to once daily for next few weeks, then trial off if tolerated.

## 2016-09-22 NOTE — Assessment & Plan Note (Signed)
Flaring recently. Discussed staying active, discussed voltaren gel and OTC rubs. Update if struggling to consider increased gabapentin.

## 2016-09-22 NOTE — Assessment & Plan Note (Signed)
Stable period. Encouraged return to endo.

## 2016-09-22 NOTE — Progress Notes (Signed)
BP 126/82   Pulse 84   Temp 98.5 F (36.9 C) (Oral)   Wt 186 lb 8 oz (84.6 kg)   BMI 36.42 kg/m    CC: 5 mo f/u visit Subjective:    Patient ID: Brianna Woods, female    DOB: Jul 07, 1935, 82 y.o.   MRN: [id number]  HPI: Brianna Woods is a 82 y.o. female presenting on 09/22/2016 for Follow-up   Came today with [employer]. Using rolling walker.   Feels fibromyalgia has worsened - throughout back. Hot water doesn't help. voltaren gel does help as well as heating pad. Has increased tramadol use. Diagnosed at age 68.   Seen by Dr Gabriel Carina for multinodular goiter with subclinical hyperthyroidism on long term methimazole s/p radioactive ablation 2015.  Lab Results  Component Value Date   TSH 1.31 04/21/2016     Seeing GYN Dr Leonides Schanz for thickened endometrial stripe - due for rpt eval. No vaginal bleeding or pelvic pain.   No recent abdominal pain.   Relevant past medical, surgical, family and social history reviewed and updated as indicated. Interim medical history since our last visit reviewed. Allergies and medications reviewed and updated. Current Outpatient Prescriptions on File Prior to Visit  Medication Sig  . albuterol (VENTOLIN HFA) 108 (90 BASE) MCG/ACT inhaler Inhale 2 puffs into the lungs every 6 (six) hours as needed. for wheezing  . aspirin EC 81 MG tablet Take 1 tablet (81 mg total) by mouth daily.  Marland Kitchen atorvastatin (LIPITOR) 40 MG tablet Take 1 tablet (40 mg total) by mouth every Monday, Wednesday, and Friday.  . cholecalciferol 5000 UNITS TABS Take 5,000 Units by mouth daily.  . cloNIDine (CATAPRES) 0.1 MG tablet TAKE 1 TABLET 2 (TWO) TIMES DAILY AS NEEDED. TAKE ONE DAILY IN THE MORNING, SECOND IF BLOOD PRESSURE IS GREATER THAN 150/100  . esomeprazole (NEXIUM) 40 MG capsule Take 1 capsule (40 mg total) by mouth daily.  . furosemide (LASIX) 20 MG tablet Take 1 tablet (20 mg total) by mouth daily as needed for edema.  . gabapentin (NEURONTIN) 300 MG capsule TAKE 1  CAPSULE THREE TIMES DAILY  . metoprolol tartrate (LOPRESSOR) 25 MG tablet TAKE 1 TABLET TWICE DAILY  . mupirocin cream (BACTROBAN) 2 % Apply 1 application topically 2 (two) times daily.  Marland Kitchen nystatin (MYCOSTATIN/NYSTOP) powder APPLY TO THE AFFECTED AREA(S) TWICE DAILY  . ondansetron (ZOFRAN) 4 MG tablet TAKE 1 TABLET EVERY 12 HOURS AS NEEDED FOR NAUSEA  . oxybutynin (DITROPAN-XL) 10 MG 24 hr tablet TAKE 1 TABLET EVERY DAY  . potassium chloride (K-DUR) 10 MEQ tablet Take 1 tablet (10 mEq total) by mouth daily as needed (take with lasix).  . ranitidine (ZANTAC) 300 MG tablet Take 1 tablet (300 mg total) by mouth at bedtime.  . sucralfate (CARAFATE) 1 G tablet Take 1 tablet (1 g total) by mouth 2 (two) times daily. (Patient taking differently: Take 1 g by mouth daily. )  . traMADol-acetaminophen (ULTRACET) 37.5-325 MG tablet TAKE ONE TABLET BY MOUTH EVERY 6 HOURS AS NEEDED  . traZODone (DESYREL) 100 MG tablet TAKE 1/2 TO 1 TABLET AT BEDTIME  . VOLTAREN 1 % GEL APPLY 4 GRAMS TOPICALLY FOUR TIMES DAILY AS NEEDED  . vitamin C (ASCORBIC ACID) 500 MG tablet Take 500 mg by mouth daily.  . [DISCONTINUED] pirbuterol (MAXAIR) 200 MCG/INH inhaler Inhale 2 puffs into the lungs 4 (four) times daily.   No current facility-administered medications on file prior to visit.     Review of Systems  Per HPI unless specifically indicated in ROS section     Objective:    BP 126/82   Pulse 84   Temp 98.5 F (36.9 C) (Oral)   Wt 186 lb 8 oz (84.6 kg)   BMI 36.42 kg/m   Wt Readings from Last 3 Encounters:  09/22/16 186 lb 8 oz (84.6 kg)  04/21/16 191 lb (86.6 kg)  04/21/16 191 lb (86.6 kg)    Physical Exam  Constitutional: She appears well-developed and well-nourished. No distress.  HENT:  Head: Normocephalic and atraumatic.  Mouth/Throat: Oropharynx is clear and moist. No oropharyngeal exudate.  Eyes: Conjunctivae and EOM are normal. Pupils are equal, round, and reactive to light. No scleral icterus.    Neck: Normal range of motion. Neck supple. No thyromegaly present.  Cardiovascular: Normal rate, regular rhythm, normal heart sounds and intact distal pulses.   No murmur heard. Pulmonary/Chest: Effort normal and breath sounds normal. No respiratory distress. She has no wheezes. She has no rales.  Musculoskeletal: She exhibits no edema.  Lymphadenopathy:    She has no cervical adenopathy.  Skin: Skin is warm and dry. No rash noted.  Psychiatric: She has a normal mood and affect.  Nursing note and vitals reviewed.  Results for orders placed or performed in visit on 04/21/16  Lipid panel  Result Value Ref Range   Cholesterol 203 (H) 0 - 200 mg/dL   Triglycerides 99.0 0.0 - 149.0 mg/dL   HDL 54.40 >39.00 mg/dL   VLDL 19.8 0.0 - 40.0 mg/dL   LDL Cholesterol 129 (H) 0 - 99 mg/dL   Total CHOL/HDL Ratio 4    NonHDL 148.95   TSH  Result Value Ref Range   TSH 1.31 0.35 - 4.50 uIU/mL  Basic metabolic panel  Result Value Ref Range   Sodium 140 135 - 145 mEq/L   Potassium 4.0 3.5 - 5.1 mEq/L   Chloride 102 96 - 112 mEq/L   CO2 30 19 - 32 mEq/L   Glucose, Bld 99 70 - 99 mg/dL   BUN 13 6 - 23 mg/dL   Creatinine, Ser 0.95 0.40 - 1.20 mg/dL   Calcium 10.0 8.4 - 10.5 mg/dL   GFR 72.91 >60.00 mL/min  CBC with Differential/Platelet  Result Value Ref Range   WBC 10.0 4.0 - 10.5 K/uL   RBC 4.10 3.87 - 5.11 Mil/uL   Hemoglobin 12.2 12.0 - 15.0 g/dL   HCT 36.6 36.0 - 46.0 %   MCV 89.3 78.0 - 100.0 fl   MCHC 33.2 30.0 - 36.0 g/dL   RDW 13.9 11.5 - 15.5 %   Platelets 232.0 150.0 - 400.0 K/uL   Neutrophils Relative % 67.4 43.0 - 77.0 %   Lymphocytes Relative 23.7 12.0 - 46.0 %   Monocytes Relative 6.9 3.0 - 12.0 %   Eosinophils Relative 1.6 0.0 - 5.0 %   Basophils Relative 0.4 0.0 - 3.0 %   Neutro Abs 6.7 1.4 - 7.7 K/uL   Lymphs Abs 2.4 0.7 - 4.0 K/uL   Monocytes Absolute 0.7 0.1 - 1.0 K/uL   Eosinophils Absolute 0.2 0.0 - 0.7 K/uL   Basophils Absolute 0.0 0.0 - 0.1 K/uL       Assessment & Plan:   Problem List Items Addressed This Visit    Atherosclerosis of abdominal aorta (HCC)    Continue aspirin, statin.       CAD (coronary artery disease)    Continue aspirin, statin.      Fibromyalgia - Primary  Flaring recently. Discussed staying active, discussed voltaren gel and OTC rubs. Update if struggling to consider increased gabapentin.       Generalized abdominal pain    Overall stable period. No longer bothering her.       GERD (gastroesophageal reflux disease)    Continue nexium, zantac, decrease carafate to once daily for next few weeks, then trial off if tolerated.       HLD (hyperlipidemia)    Tolerating atorvastatin MWF. Has stopped coq10      HTN (hypertension)    Chronic, stable. Continue current regimen.       Increased endometrial stripe thickness    Encouraged f/u with Dr Leonides Schanz.       Nodular goiter, toxic or with hyperthyroidism    Stable period. Encouraged return to endo.           Follow up plan: Return in about 7 months (around 04/22/2017) for medicare wellness visit.  Ria Bush, MD

## 2016-09-22 NOTE — Assessment & Plan Note (Signed)
Chronic, stable. Continue current regimen. 

## 2016-09-25 ENCOUNTER — Other Ambulatory Visit: Payer: Self-pay | Admitting: Family Medicine

## 2016-09-25 NOTE — Telephone Encounter (Signed)
Ok to refill? Last filled 05/26/16 #40 2RF

## 2016-12-11 ENCOUNTER — Other Ambulatory Visit: Payer: Self-pay | Admitting: Family Medicine

## 2016-12-11 NOTE — Telephone Encounter (Signed)
Pt has CPE scheduled for 04/27/17, last filled on 09/25/16 #100g with 1 additional refill, please advise

## 2016-12-21 ENCOUNTER — Other Ambulatory Visit: Payer: Self-pay | Admitting: Family Medicine

## 2017-02-12 ENCOUNTER — Other Ambulatory Visit: Payer: Self-pay | Admitting: Family Medicine

## 2017-02-12 NOTE — Telephone Encounter (Signed)
Received refill request electronically Last office visit 09/22/16, upcoming appointment scheduled 8/17/8 Amlodipine is not on medication list Last refill on Gabapentin 05/25/16 #270/3 Zofran 09/25/16 #40/2 Trazodone 05/29/16 #90/3 Voltaren 12/21/16 100g

## 2017-02-13 NOTE — Telephone Encounter (Signed)
Off amlodipine due to pedal edema.  Refilled other meds.

## 2017-02-26 ENCOUNTER — Other Ambulatory Visit: Payer: Self-pay | Admitting: Family Medicine

## 2017-03-20 ENCOUNTER — Other Ambulatory Visit: Payer: Self-pay | Admitting: Family Medicine

## 2017-04-19 ENCOUNTER — Other Ambulatory Visit: Payer: Self-pay | Admitting: Family Medicine

## 2017-04-22 ENCOUNTER — Other Ambulatory Visit: Payer: Self-pay | Admitting: Family Medicine

## 2017-04-22 DIAGNOSIS — E0520 Thyrotoxicosis with toxic multinodular goiter without thyrotoxic crisis or storm: Secondary | ICD-10-CM

## 2017-04-22 DIAGNOSIS — D509 Iron deficiency anemia, unspecified: Secondary | ICD-10-CM

## 2017-04-22 DIAGNOSIS — K8689 Other specified diseases of pancreas: Secondary | ICD-10-CM

## 2017-04-22 DIAGNOSIS — E559 Vitamin D deficiency, unspecified: Secondary | ICD-10-CM

## 2017-04-22 DIAGNOSIS — I10 Essential (primary) hypertension: Secondary | ICD-10-CM

## 2017-04-22 DIAGNOSIS — E785 Hyperlipidemia, unspecified: Secondary | ICD-10-CM

## 2017-04-24 ENCOUNTER — Ambulatory Visit: Payer: Medicare PPO

## 2017-04-24 ENCOUNTER — Other Ambulatory Visit: Payer: Medicare PPO

## 2017-04-27 ENCOUNTER — Encounter: Payer: Medicare PPO | Admitting: Family Medicine

## 2017-04-29 ENCOUNTER — Other Ambulatory Visit: Payer: Self-pay | Admitting: Family Medicine

## 2017-05-23 ENCOUNTER — Other Ambulatory Visit: Payer: Self-pay

## 2017-05-23 MED ORDER — DICLOFENAC SODIUM 1 % TD GEL: TRANSDERMAL | 0 refills | Status: DC

## 2017-05-23 NOTE — Telephone Encounter (Signed)
Rx sent to pharmacy   

## 2017-05-25 ENCOUNTER — Ambulatory Visit: Payer: Medicare PPO

## 2017-05-31 ENCOUNTER — Encounter: Payer: Medicare PPO | Admitting: Family Medicine

## 2017-06-13 ENCOUNTER — Other Ambulatory Visit: Payer: Self-pay | Admitting: Family Medicine

## 2017-06-22 ENCOUNTER — Ambulatory Visit: Payer: Medicare PPO

## 2017-06-26 ENCOUNTER — Ambulatory Visit: Payer: Medicare PPO

## 2017-06-28 ENCOUNTER — Ambulatory Visit (INDEPENDENT_AMBULATORY_CARE_PROVIDER_SITE_OTHER): Payer: Medicare PPO

## 2017-06-28 ENCOUNTER — Ambulatory Visit (INDEPENDENT_AMBULATORY_CARE_PROVIDER_SITE_OTHER): Payer: Medicare PPO | Admitting: Family Medicine

## 2017-06-28 VITALS — BP 112/86 | HR 59 | Temp 98.6°F | Ht 60.5 in | Wt 193.2 lb

## 2017-06-28 DIAGNOSIS — Z0000 Encounter for general adult medical examination without abnormal findings: Secondary | ICD-10-CM

## 2017-06-28 DIAGNOSIS — I10 Essential (primary) hypertension: Secondary | ICD-10-CM | POA: Diagnosis not present

## 2017-06-28 DIAGNOSIS — E559 Vitamin D deficiency, unspecified: Secondary | ICD-10-CM

## 2017-06-28 DIAGNOSIS — Z7189 Other specified counseling: Secondary | ICD-10-CM

## 2017-06-28 DIAGNOSIS — I2510 Atherosclerotic heart disease of native coronary artery without angina pectoris: Secondary | ICD-10-CM

## 2017-06-28 DIAGNOSIS — E0520 Thyrotoxicosis with toxic multinodular goiter without thyrotoxic crisis or storm: Secondary | ICD-10-CM | POA: Diagnosis not present

## 2017-06-28 DIAGNOSIS — M797 Fibromyalgia: Secondary | ICD-10-CM | POA: Diagnosis not present

## 2017-06-28 DIAGNOSIS — K8689 Other specified diseases of pancreas: Secondary | ICD-10-CM | POA: Diagnosis not present

## 2017-06-28 DIAGNOSIS — I700 Atherosclerosis of aorta: Secondary | ICD-10-CM

## 2017-06-28 DIAGNOSIS — R1084 Generalized abdominal pain: Secondary | ICD-10-CM

## 2017-06-28 DIAGNOSIS — Z23 Encounter for immunization: Secondary | ICD-10-CM

## 2017-06-28 DIAGNOSIS — R32 Unspecified urinary incontinence: Secondary | ICD-10-CM

## 2017-06-28 DIAGNOSIS — E785 Hyperlipidemia, unspecified: Secondary | ICD-10-CM

## 2017-06-28 DIAGNOSIS — D509 Iron deficiency anemia, unspecified: Secondary | ICD-10-CM

## 2017-06-28 LAB — COMPREHENSIVE METABOLIC PANEL
ALT: 10 U/L (ref 0–35)
AST: 16 U/L (ref 0–37)
Albumin: 3.9 g/dL (ref 3.5–5.2)
Alkaline Phosphatase: 80 U/L (ref 39–117)
BILIRUBIN TOTAL: 0.5 mg/dL (ref 0.2–1.2)
BUN: 8 mg/dL (ref 6–23)
CO2: 27 meq/L (ref 19–32)
Calcium: 9.3 mg/dL (ref 8.4–10.5)
Chloride: 105 mEq/L (ref 96–112)
Creatinine, Ser: 0.84 mg/dL (ref 0.40–1.20)
GFR: 83.78 mL/min (ref >60.00)
GLUCOSE: 104 mg/dL — AB (ref 70–99)
Potassium: 3.8 mEq/L (ref 3.5–5.1)
SODIUM: 139 meq/L (ref 135–145)
Total Protein: 7 g/dL (ref 6.0–8.3)

## 2017-06-28 LAB — CBC WITH DIFFERENTIAL/PLATELET
BASOS PCT: 0.4 % (ref 0.0–3.0)
Basophils Absolute: 0 10*3/uL (ref 0.0–0.1)
EOS PCT: 2.1 % (ref 0.0–5.0)
Eosinophils Absolute: 0.1 10*3/uL (ref 0.0–0.7)
HEMATOCRIT: 36.1 % (ref 36.0–46.0)
HEMOGLOBIN: 11.8 g/dL — AB (ref 12.0–15.0)
LYMPHS PCT: 27.4 % (ref 12.0–46.0)
Lymphs Abs: 1.8 10*3/uL (ref 0.7–4.0)
MCHC: 32.7 g/dL (ref 30.0–36.0)
MCV: 90.6 fl (ref 78.0–100.0)
MONO ABS: 0.4 10*3/uL (ref 0.1–1.0)
Monocytes Relative: 5.9 % (ref 3.0–12.0)
NEUTROS ABS: 4.3 10*3/uL (ref 1.4–7.7)
Neutrophils Relative %: 64.2 % (ref 43.0–77.0)
PLATELETS: 224 10*3/uL (ref 150.0–400.0)
RBC: 3.98 Mil/uL (ref 3.87–5.11)
RDW: 15.3 % (ref 11.5–15.5)
WBC: 6.7 10*3/uL (ref 4.0–10.5)

## 2017-06-28 LAB — LIPID PANEL
CHOL/HDL RATIO: 5
Cholesterol: 204 mg/dL — ABNORMAL HIGH (ref 0–200)
HDL: 45.3 mg/dL (ref >39.00)
LDL Cholesterol: 138 mg/dL — ABNORMAL HIGH (ref 0–99)
NONHDL: 158.92
Triglycerides: 106 mg/dL (ref 0.0–149.0)
VLDL: 21.2 mg/dL (ref 0.0–40.0)

## 2017-06-28 LAB — VITAMIN D 25 HYDROXY (VIT D DEFICIENCY, FRACTURES): VITD: 16.06 ng/mL — ABNORMAL LOW (ref 30.00–100.00)

## 2017-06-28 LAB — LIPASE: Lipase: 4 U/L — ABNORMAL LOW (ref 11.0–59.0)

## 2017-06-28 LAB — TSH: TSH: 2.15 u[IU]/mL (ref 0.35–4.50)

## 2017-06-28 MED ORDER — GABAPENTIN 300 MG PO CAPS: 300.0000 mg | ORAL_CAPSULE | Freq: Four times a day (QID) | ORAL | 3 refills | Status: DC

## 2017-06-28 MED ORDER — AMLODIPINE BESYLATE 10 MG PO TABS: 10.0000 mg | ORAL_TABLET | Freq: Every day | ORAL | 3 refills | Status: DC

## 2017-06-28 MED ORDER — METOPROLOL TARTRATE 25 MG PO TABS: 12.5000 mg | ORAL_TABLET | Freq: Two times a day (BID) | ORAL | 3 refills | Status: DC

## 2017-06-28 NOTE — Progress Notes (Signed)
Pre visit review using our clinic review tool, if applicable. No additional management support is needed unless otherwise documented below in the visit note. 

## 2017-06-28 NOTE — Assessment & Plan Note (Signed)
Chronic, stable. Continue statin MWF. Update FLP today.

## 2017-06-28 NOTE — Assessment & Plan Note (Addendum)
Will increase oxybutynin 10mg  XL to 2 tablets daily given ongoing intermittent urinary urgency incontinence. Advised monitor for anticholinergic symptoms.

## 2017-06-28 NOTE — Progress Notes (Signed)
Subjective:   Brianna Woods is a 82 y.o. female who presents for Medicare Annual (Subsequent) preventive examination.  Review of Systems:  N/A Cardiac Risk Factors include: advanced age (>34men, >86 women);obesity (BMI >30kg/m2);dyslipidemia;hypertension     Objective:     Vitals: BP 112/86 (BP Location: Right Arm, Patient Position: Sitting, Cuff Size: Normal)   Pulse (!) 59   Temp 98.6 F (37 C) (Oral)   Ht 5' 0.5" (1.537 m) Comment: no shoes  Wt 193 lb 4 oz (87.7 kg)   SpO2 96%   BMI 37.12 kg/m   Body mass index is 37.12 kg/m.   Tobacco History  Smoking Status  . Never Smoker  Smokeless Tobacco  . Never Used     Counseling given: No   Past Medical History:  Diagnosis Date  . Anemia   . Arthritis   . Asthma    per pt  . Atherosclerosis of abdominal aorta ([place]) 07/2015   by CT  . Barrett's esophagus    on EGD 2008, EGD WNL 2013  . CAD (coronary artery disease) 07/2015   of LAD by CT  . Diverticulosis 2014   sigmoid on colonoscopy  . Fatty pancreas 07/2015   by CT  . Fibromyalgia    per pt, no records of this   . GERD (gastroesophageal reflux disease)    and esoph stricture s/p dilation 2008 HH by CT 2016  . History of anxiety    was on prozac then effexor (pt denies h/o anxiety/depression)  . History of cardiac murmur   . History of chicken pox   . History of CVA (cerebrovascular accident) 2008   "I've had several TIAs"  . History of right bundle branch block 2011  . History of ulcer disease    per pt, no records of this  . HLD (hyperlipidemia)   . HTN (hypertension)   . Kidney cyst, acquired 07/2015   by CT, rec rpt renal MRI in 6 months  . Mixed incontinence    on ditropan  . Nephrolithiasis    s/p surgery R kidney (22mm) 11/2009  . Nodular goiter, toxic or with hyperthyroidism    h/o toxic, on methimazole, known large left thyroid nodule 08/2013 s/p beign biopsy per patient 2010, saw Dr. Gabriel Carina, Rad I ablation 04/2014  . Vitamin D deficiency      Past Surgical History:  Procedure Laterality Date  . CATARACT EXTRACTION  1990   w/ implants  . CHOLECYSTECTOMY  2006  . COLONOSCOPY  10/02/12   diverticulosis, o/w WNL ([place])  . dexa     no records received  . ESOPHAGOGASTRODUODENOSCOPY  11/2011   LA grade A esophagitis lower 1/3, dilated, med HH, o/w WNL - path: + GERD, no barrett's - f/u 11/2016  . LITHOTRIPSY Right 2011  . thyroid uptake scan  04/2014   unifrm uptake, enlarged thyroid consistent with grave's dz   Family History  Problem Relation Age of Onset  . Hyperlipidemia Mother   . Hypertension Mother   . Stroke Father        hemorrhage  . Aneurysm Father 62       deceased  . Other Brother        TB  . Coronary artery disease Brother   . Aneurysm Son 75       sudden death  . Diabetes Maternal Aunt   . Cancer Brother 11       lung   History  Sexual Activity  . Sexual activity:  No    Outpatient Encounter Prescriptions as of 06/28/2017  Medication Sig  . albuterol (VENTOLIN HFA) 108 (90 BASE) MCG/ACT inhaler Inhale 2 puffs into the lungs every 6 (six) hours as needed. for wheezing  . aspirin EC 81 MG tablet Take 1 tablet (81 mg total) by mouth daily.  Marland Kitchen atorvastatin (LIPITOR) 40 MG tablet TAKE 1 TABLET EVERY MONDAY, WEDNESDAY AND FRIDAY.  . cholecalciferol 5000 UNITS TABS Take 5,000 Units by mouth daily.  . cloNIDine (CATAPRES) 0.1 MG tablet TAKE 1 TABLET TWICE DAILY AS NEEDED. TAKE ONE DAILY IN THE MORNING, SECOND IF BLOOD PRESSURE IS GREATER THAN 150/100  . diclofenac sodium (VOLTAREN) 1 % GEL APPLY 4 GRAMS TOPICALLY FOUR TIMES DAILY AS NEEDED.  Marland Kitchen esomeprazole (NEXIUM) 40 MG capsule TAKE 1 CAPSULE DAILY (NOTIFY MD IF ANY BREAKTHROUGH SYMPTOMS ON DAILY DOSING AS DIRECTED)  . gabapentin (NEURONTIN) 300 MG capsule TAKE 1 CAPSULE THREE TIMES DAILY (Patient taking differently: TAKE 1 CAPSULE FOUR TIMES DAILY)  . metoprolol tartrate (LOPRESSOR) 25 MG tablet TAKE 1 TABLET TWICE DAILY  . oxybutynin (DITROPAN-XL) 10 MG 24  hr tablet TAKE 1 TABLET EVERY DAY  . potassium chloride (K-DUR) 10 MEQ tablet Take 1 tablet (10 mEq total) by mouth daily as needed (take with lasix).  . ranitidine (ZANTAC) 300 MG tablet TAKE 1 TABLET AT BEDTIME  . sucralfate (CARAFATE) 1 G tablet Take 1 tablet (1 g total) by mouth 2 (two) times daily. (Patient taking differently: Take 1 g by mouth daily. )  . traZODone (DESYREL) 100 MG tablet TAKE 1/2 TO 1 TABLET AT BEDTIME  . vitamin C (ASCORBIC ACID) 500 MG tablet Take 500 mg by mouth daily.  . [DISCONTINUED] mupirocin cream (BACTROBAN) 2 % Apply 1 application topically 2 (two) times daily.  . [DISCONTINUED] nystatin (MYCOSTATIN/NYSTOP) powder APPLY TO THE AFFECTED AREA(S) TWICE DAILY  . furosemide (LASIX) 20 MG tablet Take 1 tablet (20 mg total) by mouth daily as needed for edema.  . ondansetron (ZOFRAN) 4 MG tablet TAKE 1 TABLET EVERY 12 HOURS AS NEEDED FOR NAUSEA (Patient not taking: Reported on 06/28/2017)  . traMADol-acetaminophen (ULTRACET) 37.5-325 MG tablet TAKE ONE TABLET BY MOUTH EVERY 6 HOURS AS NEEDED (Patient not taking: Reported on 06/28/2017)   No facility-administered encounter medications on file as of 06/28/2017.     Activities of Daily Living In your present state of health, do you have any difficulty performing the following activities: 06/28/2017  Hearing? Y  Vision? N  Difficulty concentrating or making decisions? N  Walking or climbing stairs? Y  Dressing or bathing? N  Doing errands, shopping? Y  Preparing Food and eating ? N  Using the Toilet? N  In the past six months, have you accidently leaked urine? N  Do you have problems with loss of bowel control? N  Managing your Medications? N  Managing your Finances? N  Housekeeping or managing your Housekeeping? N  Some recent data might be hidden    Patient Care Team: Ria Bush, MD as PCP - General (Family Medicine) Ward, Honor Loh, MD as Referring Physician (Obstetrics and Gynecology)      Assessment:      Hearing Screening   125Hz  250Hz  500Hz  1000Hz  2000Hz  3000Hz  4000Hz  6000Hz  8000Hz   Right ear:   40 40 40  0    Left ear:   40 40 40  0      Visual Acuity Screening   Right eye Left eye Both eyes  Without correction:  With correction: 20/25-1 20/30 20/30-1    Exercise Activities and Dietary recommendations Current Exercise Habits: Home exercise routine, Type of exercise: walking, Time (Minutes): 30, Frequency (Times/Week): 3, Weekly Exercise (Minutes/Week): 90, Intensity: Mild, Exercise limited by: None identified  Goals    . Increase physical activity          As weather permits, I will continue to walk at least 30 min 3 days per week.       Fall Risk Fall Risk  06/28/2017 04/21/2016 03/22/2015 03/22/2015 03/20/2014  Falls in the past year? No Yes - Yes Yes  Comment - pt states falls are secondary to fibromyalgia - - -  Number falls in past yr: - 2 or more - 2 or more 2 or more  Injury with Fall? - No Yes No -  Comment - - bruised leg - -  Risk Factor Category  - High Fall Risk - High Fall Risk High Fall Risk  Risk for fall due to : - Impaired mobility - Impaired balance/gait;Impaired mobility Impaired balance/gait;Impaired mobility  Follow up - Falls evaluation completed;Education provided - - -   Depression Screen PHQ 2/9 Scores 06/28/2017 04/21/2016 03/22/2015 03/20/2014  PHQ - 2 Score 0 0 0 0  PHQ- 9 Score 0 - - -     Cognitive Function MMSE - Mini Mental State Exam 06/28/2017 04/21/2016  Orientation to time 5 5  Orientation to Place 5 5  Registration 3 3  Attention/ Calculation 0 0  Recall 2 3  Recall-comments unable to recall 1 of 3 words -  Language- name 2 objects 0 0  Language- repeat 1 1  Language- follow 3 step command 3 3  Language- read & follow direction 0 0  Write a sentence 0 0  Copy design 0 0  Total score 19 20     PLEASE NOTE: A Mini-Cog screen was completed. Maximum score is 20. A value of 0 denotes this part of Folstein MMSE was  not completed or the patient failed this part of the Mini-Cog screening.   Mini-Cog Screening Orientation to Time - Max 5 pts Orientation to Place - Max 5 pts Registration - Max 3 pts Recall - Max 3 pts Language Repeat - Max 1 pts Language Follow 3 Step Command - Max 3 pts     Immunization History  Administered Date(s) Administered  . Influenza, High Dose Seasonal PF 06/28/2017  . Influenza,inj,Quad PF,6+ Mos 08/15/2013, 09/21/2014, 07/26/2015  . Influenza-Unspecified 07/26/2016  . Pneumococcal Conjugate-13 03/20/2014  . Pneumococcal Polysaccharide-23 09/11/2008  . Td 09/11/2006   Screening Tests Health Maintenance  Topic Date Due  . DTaP/Tdap/Td (1 - Tdap) 09/10/2026 (Originally 09/12/2006)  . TETANUS/TDAP  09/10/2026 (Originally 09/11/2016)  . INFLUENZA VACCINE  Completed  . DEXA SCAN  Completed  . PNA vac Low Risk Adult  Completed      Plan:   I have personally reviewed, addressed, and noted the following in the patient's chart:  A. Medical and social history B. Use of alcohol, tobacco or illicit drugs  C. Current medications and supplements D. Functional ability and status E.  Nutritional status F.  Physical activity G. Advance directives H. List of other physicians I.  Hospitalizations, surgeries, and ER visits in previous 12 months J.  [place] to include hearing, vision, cognitive, depression L. Referrals and appointments - none  In addition, I have reviewed and discussed with patient certain preventive protocols, quality metrics, and best practice recommendations. A written personalized care plan  for preventive services as well as general preventive health recommendations were provided to patient.  See attached scanned questionnaire for additional information.   Signed,   Lindell Noe, MHA, BS, LPN Health Coach

## 2017-06-28 NOTE — Addendum Note (Signed)
Addended by: Ellamae Sia on: 06/28/2017 11:17 AM   Modules accepted: Orders

## 2017-06-28 NOTE — Assessment & Plan Note (Signed)
Continue aspirin, statin.  

## 2017-06-28 NOTE — Assessment & Plan Note (Signed)
Advanced directive: has not set up. Has discussed with family her wishes. Would want Jannet Mantis daughter in [place] to be HCPOA. Wants full code but no prolonged life support if terminal diagnosis.

## 2017-06-28 NOTE — Assessment & Plan Note (Addendum)
Stable with voltaren and vicks rub. Now off tramadol.  She has increased gabapentin to QID dosing with good effect. Will continue.

## 2017-06-28 NOTE — Assessment & Plan Note (Signed)
This and nausea has significantly improved off regular tramadol.

## 2017-06-28 NOTE — Patient Instructions (Addendum)
I have increased gabapentin to four times daily dosing.  Decrease lopressor to 12.'5mg'$  twice daily (1/2 tablet twice daily). Keep an eye on blood pressures.  Call to schedule repeat endoscopy with GI.  Try 2 oxybutynin at bedtime and let me know how you tolerate this, and if it's helpful. Return in 6 months for follow up visit.   Health Maintenance, Female Adopting a healthy lifestyle and getting preventive care can go a long way to promote health and wellness. Talk with your health care provider about what schedule of regular examinations is right for you. This is a good chance for you to check in with your provider about disease prevention and staying healthy. In between checkups, there are plenty of things you can do on your own. Experts have done a lot of research about which lifestyle changes and preventive measures are most likely to keep you healthy. Ask your health care provider for more information. Weight and diet Eat a healthy diet  Be sure to include plenty of vegetables, fruits, low-fat dairy products, and lean protein.  Do not eat a lot of foods high in solid fats, added sugars, or salt.  Get regular exercise. This is one of the most important things you can do for your health. ? Most adults should exercise for at least 150 minutes each week. The exercise should increase your heart rate and make you sweat (moderate-intensity exercise). ? Most adults should also do strengthening exercises at least twice a week. This is in addition to the moderate-intensity exercise.  Maintain a healthy weight  Body mass index (BMI) is a measurement that can be used to identify possible weight problems. It estimates body fat based on height and weight. Your health care provider can help determine your BMI and help you achieve or maintain a healthy weight.  For females 45 years of age and older: ? A BMI below 18.5 is considered underweight. ? A BMI of 18.5 to 24.9 is normal. ? A BMI of 25 to 29.9  is considered overweight. ? A BMI of 30 and above is considered obese.  Watch levels of cholesterol and blood lipids  You should start having your blood tested for lipids and cholesterol at 80 years of age, then have this test every 5 years.  You may need to have your cholesterol levels checked more often if: ? Your lipid or cholesterol levels are high. ? You are older than 80 years of age. ? You are at high risk for heart disease.  Cancer screening Lung Cancer  Lung cancer screening is recommended for adults 78-46 years old who are at high risk for lung cancer because of a history of smoking.  A yearly low-dose CT scan of the lungs is recommended for people who: ? Currently smoke. ? Have quit within the past 15 years. ? Have at least a 30-pack-year history of smoking. A pack year is smoking an average of one pack of cigarettes a day for 1 year.  Yearly screening should continue until it has been 15 years since you quit.  Yearly screening should stop if you develop a health problem that would prevent you from having lung cancer treatment.  Breast Cancer  Practice breast self-awareness. This means understanding how your breasts normally appear and feel.  It also means doing regular breast self-exams. Let your health care provider know about any changes, no matter how small.  If you are in your 20s or 30s, you should have a clinical breast exam (CBE)  by a health care provider every 1-3 years as part of a regular health exam.  If you are 40 or older, have a CBE every year. Also consider having a breast X-ray (mammogram) every year.  If you have a family history of breast cancer, talk to your health care provider about genetic screening.  If you are at high risk for breast cancer, talk to your health care provider about having an MRI and a mammogram every year.  Breast cancer gene (BRCA) assessment is recommended for women who have family members with BRCA-related cancers.  BRCA-related cancers include: ? Breast. ? Ovarian. ? Tubal. ? Peritoneal cancers.  Results of the assessment will determine the need for genetic counseling and BRCA1 and BRCA2 testing.  Cervical Cancer Your health care provider may recommend that you be screened regularly for cancer of the pelvic organs (ovaries, uterus, and vagina). This screening involves a pelvic examination, including checking for microscopic changes to the surface of your cervix (Pap test). You may be encouraged to have this screening done every 3 years, beginning at age 21.  For women ages 30-65, health care providers may recommend pelvic exams and Pap testing every 3 years, or they may recommend the Pap and pelvic exam, combined with testing for human papilloma virus (HPV), every 5 years. Some types of HPV increase your risk of cervical cancer. Testing for HPV may also be done on women of any age with unclear Pap test results.  Other health care providers may not recommend any screening for nonpregnant women who are considered low risk for pelvic cancer and who do not have symptoms. Ask your health care provider if a screening pelvic exam is right for you.  If you have had past treatment for cervical cancer or a condition that could lead to cancer, you need Pap tests and screening for cancer for at least 20 years after your treatment. If Pap tests have been discontinued, your risk factors (such as having a new sexual partner) need to be reassessed to determine if screening should resume. Some women have medical problems that increase the chance of getting cervical cancer. In these cases, your health care provider may recommend more frequent screening and Pap tests.  Colorectal Cancer  This type of cancer can be detected and often prevented.  Routine colorectal cancer screening usually begins at 80 years of age and continues through 80 years of age.  Your health care provider may recommend screening at an earlier age if  you have risk factors for colon cancer.  Your health care provider may also recommend using home test kits to check for hidden blood in the stool.  A small camera at the end of a tube can be used to examine your colon directly (sigmoidoscopy or colonoscopy). This is done to check for the earliest forms of colorectal cancer.  Routine screening usually begins at age 50.  Direct examination of the colon should be repeated every 5-10 years through 80 years of age. However, you may need to be screened more often if early forms of precancerous polyps or small growths are found.  Skin Cancer  Check your skin from head to toe regularly.  Tell your health care provider about any new moles or changes in moles, especially if there is a change in a mole's shape or color.  Also tell your health care provider if you have a mole that is larger than the size of a pencil eraser.  Always use sunscreen. Apply sunscreen liberally and   repeatedly throughout the day.  Protect yourself by wearing long sleeves, pants, a wide-brimmed hat, and sunglasses whenever you are outside.  Heart disease, diabetes, and high blood pressure  High blood pressure causes heart disease and increases the risk of stroke. High blood pressure is more likely to develop in: ? People who have blood pressure in the high end of the normal range (130-139/85-89 mm Hg). ? People who are overweight or obese. ? People who are African American.  If you are 18-39 years of age, have your blood pressure checked every 3-5 years. If you are 40 years of age or older, have your blood pressure checked every year. You should have your blood pressure measured twice-once when you are at a hospital or clinic, and once when you are not at a hospital or clinic. Record the average of the two measurements. To check your blood pressure when you are not at a hospital or clinic, you can use: ? An automated blood pressure machine at a pharmacy. ? A home blood  pressure monitor.  If you are between 55 years and 79 years old, ask your health care provider if you should take aspirin to prevent strokes.  Have regular diabetes screenings. This involves taking a blood sample to check your fasting blood sugar level. ? If you are at a normal weight and have a low risk for diabetes, have this test once every three years after 80 years of age. ? If you are overweight and have a high risk for diabetes, consider being tested at a younger age or more often. Preventing infection Hepatitis B  If you have a higher risk for hepatitis B, you should be screened for this virus. You are considered at high risk for hepatitis B if: ? You were born in a country where hepatitis B is common. Ask your health care provider which countries are considered high risk. ? Your parents were born in a high-risk country, and you have not been immunized against hepatitis B (hepatitis B vaccine). ? You have HIV or AIDS. ? You use needles to inject street drugs. ? You live with someone who has hepatitis B. ? You have had sex with someone who has hepatitis B. ? You get hemodialysis treatment. ? You take certain medicines for conditions, including cancer, organ transplantation, and autoimmune conditions.  Hepatitis C  Blood testing is recommended for: ? Everyone born from 1945 through 1965. ? Anyone with known risk factors for hepatitis C.  Sexually transmitted infections (STIs)  You should be screened for sexually transmitted infections (STIs) including gonorrhea and chlamydia if: ? You are sexually active and are younger than 80 years of age. ? You are older than 80 years of age and your health care provider tells you that you are at risk for this type of infection. ? Your sexual activity has changed since you were last screened and you are at an increased risk for chlamydia or gonorrhea. Ask your health care provider if you are at risk.  If you do not have HIV, but are at risk,  it may be recommended that you take a prescription medicine daily to prevent HIV infection. This is called pre-exposure prophylaxis (PrEP). You are considered at risk if: ? You are sexually active and do not regularly use condoms or know the HIV status of your partner(s). ? You take drugs by injection. ? You are sexually active with a partner who has HIV.  Talk with your health care provider about whether you   are at high risk of being infected with HIV. If you choose to begin PrEP, you should first be tested for HIV. You should then be tested every 3 months for as long as you are taking PrEP. Pregnancy  If you are premenopausal and you may become pregnant, ask your health care provider about preconception counseling.  If you may become pregnant, take 400 to 800 micrograms (mcg) of folic acid every day.  If you want to prevent pregnancy, talk to your health care provider about birth control (contraception). Osteoporosis and menopause  Osteoporosis is a disease in which the bones lose minerals and strength with aging. This can result in serious bone fractures. Your risk for osteoporosis can be identified using a bone density scan.  If you are 56 years of age or older, or if you are at risk for osteoporosis and fractures, ask your health care provider if you should be screened.  Ask your health care provider whether you should take a calcium or vitamin D supplement to lower your risk for osteoporosis.  Menopause may have certain physical symptoms and risks.  Hormone replacement therapy may reduce some of these symptoms and risks. Talk to your health care provider about whether hormone replacement therapy is right for you. Follow these instructions at home:  Schedule regular health, dental, and eye exams.  Stay current with your immunizations.  Do not use any tobacco products including cigarettes, chewing tobacco, or electronic cigarettes.  If you are pregnant, do not drink  alcohol.  If you are breastfeeding, limit how much and how often you drink alcohol.  Limit alcohol intake to no more than 1 drink per day for nonpregnant women. One drink equals 12 ounces of beer, 5 ounces of wine, or 1 ounces of hard liquor.  Do not use street drugs.  Do not share needles.  Ask your health care provider for help if you need support or information about quitting drugs.  Tell your health care provider if you often feel depressed.  Tell your health care provider if you have ever been abused or do not feel safe at home. This information is not intended to replace advice given to you by your health care provider. Make sure you discuss any questions you have with your health care provider. Document Released: 03/13/2011 Document Revised: 02/03/2016 Document Reviewed: 06/01/2015 Elsevier Interactive Patient Education  [employer].

## 2017-06-28 NOTE — Progress Notes (Signed)
PCP notes:   Health maintenance:  Flu vaccine - administered Tetanus vaccine - postponed/insurance  Abnormal screenings:   Mini-Cog score: 19/20 Hearing - failed  Hearing Screening   125Hz  250Hz  500Hz  1000Hz  2000Hz  3000Hz  4000Hz  6000Hz  8000Hz   Right ear:   40 40 40  0    Left ear:   40 40 40  0     Patient concerns:   Medication management - pt wants to discuss Gabapentin. States she is running out of medication. Also, pt reports Oxybutynin is not effective.   Nurse concerns:  None  Next PCP appt:   06/28/17 @ 0930

## 2017-06-28 NOTE — Patient Instructions (Addendum)
PLEASE CONTACT YOUR INSURANCE REGARDING COVERAGE FOR TETANUS VACCINE.    Brianna Woods , Thank you for taking time to come for your Medicare Wellness Visit. I appreciate your ongoing commitment to your health goals. Please review the following plan we discussed and let me know if I can assist you in the future.   These are the goals we discussed: Goals    . Increase physical activity          As weather permits, I will continue to walk at least 30 min 3 days per week.        This is a list of the screening recommended for you and due dates:  Health Maintenance  Topic Date Due  . DTaP/Tdap/Td vaccine (1 - Tdap) 09/10/2026*  . Tetanus Vaccine  09/10/2026*  . Flu Shot  Completed  . DEXA scan (bone density measurement)  Completed  . Pneumonia vaccines  Completed  *Topic was postponed. The date shown is not the original due date.   Preventive Care for Adults  A healthy lifestyle and preventive care can promote health and wellness. Preventive health guidelines for adults include the following key practices.  . A routine yearly physical is a good way to check with your health care provider about your health and preventive screening. It is a chance to share any concerns and updates on your health and to receive a thorough exam.  . Visit your dentist for a routine exam and preventive care every 6 months. Brush your teeth twice a day and floss once a day. Good oral hygiene prevents tooth decay and gum disease.  . The frequency of eye exams is based on your age, health, family medical history, use  of contact lenses, and other factors. Follow your health care provider's recommendations for frequency of eye exams.  . Eat a healthy diet. Foods like vegetables, fruits, whole grains, low-fat dairy products, and lean protein foods contain the nutrients you need without too many calories. Decrease your intake of foods high in solid fats, added sugars, and salt. Eat the right amount of calories for you.  Get information about a proper diet from your health care provider, if necessary.  . Regular physical exercise is one of the most important things you can do for your health. Most adults should get at least 150 minutes of moderate-intensity exercise (any activity that increases your heart rate and causes you to sweat) each week. In addition, most adults need muscle-strengthening exercises on 2 or more days a week.  Silver Sneakers may be a benefit available to you. To determine eligibility, you may visit the website: www.silversneakers.com or contact program at 334-643-6764 Mon-Fri between 8AM-8PM.   . Maintain a healthy weight. The body mass index (BMI) is a screening tool to identify possible weight problems. It provides an estimate of body fat based on height and weight. Your health care provider can find your BMI and can help you achieve or maintain a healthy weight.   For adults 20 years and older: ? A BMI below 18.5 is considered underweight. ? A BMI of 18.5 to 24.9 is normal. ? A BMI of 25 to 29.9 is considered overweight. ? A BMI of 30 and above is considered obese.   . Maintain normal blood lipids and cholesterol levels by exercising and minimizing your intake of saturated fat. Eat a balanced diet with plenty of fruit and vegetables. Blood tests for lipids and cholesterol should begin at age 80 and be repeated every 5 years.  If your lipid or cholesterol levels are high, you are over 50, or you are at high risk for heart disease, you may need your cholesterol levels checked more frequently. Ongoing high lipid and cholesterol levels should be treated with medicines if diet and exercise are not working.  . If you smoke, find out from your health care provider how to quit. If you do not use tobacco, please do not start.  . If you choose to drink alcohol, please do not consume more than 2 drinks per day. One drink is considered to be 12 ounces (355 mL) of beer, 5 ounces (148 mL) of wine, or  1.5 ounces (44 mL) of liquor.  . If you are 82-65 years old, ask your health care provider if you should take aspirin to prevent strokes.  . Use sunscreen. Apply sunscreen liberally and repeatedly throughout the day. You should seek shade when your shadow is shorter than you. Protect yourself by wearing long sleeves, pants, a wide-brimmed hat, and sunglasses year round, whenever you are outdoors.  . Once a month, do a whole body skin exam, using a mirror to look at the skin on your back. Tell your health care provider of new moles, moles that have irregular borders, moles that are larger than a pencil eraser, or moles that have changed in shape or color.

## 2017-06-28 NOTE — Assessment & Plan Note (Signed)
Chronic, stable. Oh by the way - pt restarted amloipine - will refill 10mg  dose. Given great control to date - advised trial 12.5mg  metoprolol BID lower dose. Continue other regimen.

## 2017-06-28 NOTE — Progress Notes (Signed)
BP 112/86 (BP Location: Right Arm, Patient Position: Sitting, Cuff Size: Normal)   Pulse (!) 59   Temp 98.6 F (37 C) (Oral)   Ht 5' 0.5" (1.537 m) Comment: no shoes  Wt 193 lb 4 oz (87.7 kg)   SpO2 96%   BMI 37.12 kg/m    CC: CPE Subjective:    Patient ID: Brianna Woods, female    DOB: 1935/03/25, 82 y.o.   MRN: [id number]  HPI: Brianna Woods is a 82 y.o. female presenting on 06/28/2017 for Annual Exam (Pt 2. Wants to discuss increasing gabapentin. Says oxybutynin is not working)   Annia Belt today for [employer] visit. Note reviewed.  Requests higher gabapentin dose - she has increased to QID dosing.  Oxybutynin not as effective.   Oh by the way - she has restarted amlodipine 10mg  daily, tolerating without significant pedal edema.  She is now off tramadol, using voltaren and vicks rub for joints. She has found nausea has improved off tramadol.   Seen by Dr Gabriel Carina for multinodular goiter with subclinical hyperthyroidism on long term methimazole s/p radioactive ablation 2015.   Preventative: ESOPHAGOGASTRODUODENOSCOPY Date: 11/2011 LA grade A esophagitis lower 1/3, dilated, med HH, o/w WNL - path: + GERD, no barrett's - f/u 11/2016  COLONOSCOPY Date: 10/02/12 diverticulosis, o/w WNL (Oh)  Mammogram WNL 03/2015. Always normal. Declines repeat. She does breast exams at home.  Well woman - H/o calcified benign ovarian cyst. She saw Dr Leonides Schanz - she will schedule f/u.  DEXA - in [place] normal per patient. Flu shot yearly Tetanus - 2008  Pneumonia shot - done 2010. prevnar 2015 Shingles shot - doesn't think would want. Advanced directive: has not set up. Has discussed with family her wishes. Would want Jannet Mantis daughter in [place] to be HCPOA. Wants full code but no prolonged life support if terminal diagnosis.  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 Seat belt use discussed.  Sunscreen use discussed. no changing moles on skin.  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 Interim medical history since our last visit reviewed. Allergies and medications reviewed and updated. Outpatient Medications Prior to Visit  Medication Sig Dispense Refill  . albuterol (VENTOLIN HFA) 108 (90 BASE) MCG/ACT inhaler Inhale 2 puffs into the lungs every 6 (six) hours as needed. for wheezing 54 g 3  . aspirin EC 81 MG tablet Take 1 tablet (81 mg total) by mouth daily.    Marland Kitchen atorvastatin (LIPITOR) 40 MG tablet TAKE 1 TABLET EVERY MONDAY, WEDNESDAY AND FRIDAY. 39 tablet 3  . cholecalciferol 5000 UNITS TABS Take 5,000 Units by mouth daily. 90 tablet 3  . cloNIDine (CATAPRES) 0.1 MG tablet TAKE 1 TABLET TWICE DAILY AS NEEDED. TAKE ONE DAILY IN THE MORNING, SECOND IF BLOOD PRESSURE IS GREATER THAN 150/100 180 tablet 0  . diclofenac sodium (VOLTAREN) 1 % GEL APPLY 4 GRAMS TOPICALLY FOUR TIMES DAILY AS NEEDED. 100 g 0  . esomeprazole (NEXIUM) 40 MG capsule TAKE 1 CAPSULE DAILY (NOTIFY MD IF ANY BREAKTHROUGH SYMPTOMS ON DAILY DOSING AS DIRECTED) 90 capsule 3  .  oxybutynin (DITROPAN-XL) 10 MG 24 hr tablet TAKE 1 TABLET EVERY DAY 90 tablet 1  . potassium chloride (K-DUR) 10 MEQ tablet Take 1 tablet (10 mEq total) by mouth daily as needed (take with lasix). 90 tablet 1  . ranitidine (ZANTAC) 300 MG tablet TAKE 1 TABLET AT BEDTIME 90 tablet 3  . sucralfate (CARAFATE) 1 G tablet Take 1 tablet (1 g total) by mouth 2 (two) times daily. (Patient taking differently: Take 1 g by mouth daily. ) 360 tablet 3  . traZODone (DESYREL) 100 MG tablet TAKE 1/2 TO 1 TABLET AT BEDTIME 90 tablet 3  . vitamin C (ASCORBIC  ACID) 500 MG tablet Take 500 mg by mouth daily.    Marland Kitchen gabapentin (NEURONTIN) 300 MG capsule TAKE 1 CAPSULE THREE TIMES DAILY (Patient taking differently: TAKE 1 CAPSULE FOUR TIMES DAILY) 270 capsule 3  . metoprolol tartrate (LOPRESSOR) 25 MG tablet TAKE 1 TABLET TWICE DAILY 180 tablet 0  . furosemide (LASIX) 20 MG tablet Take 1 tablet (20 mg total) by mouth daily as needed for edema. 90 tablet 1  . ondansetron (ZOFRAN) 4 MG tablet TAKE 1 TABLET EVERY 12 HOURS AS NEEDED FOR NAUSEA (Patient not taking: Reported on 06/28/2017) 40 tablet 2  . traMADol-acetaminophen (ULTRACET) 37.5-325 MG tablet TAKE ONE TABLET BY MOUTH EVERY 6 HOURS AS NEEDED (Patient not taking: Reported on 06/28/2017) 60 tablet 0   No facility-administered medications prior to visit.      Per HPI unless specifically indicated in ROS section below Review of Systems  Constitutional: Negative for activity change, appetite change, chills, fatigue, fever and unexpected weight change.  HENT: Negative for hearing loss.   Eyes: Negative for visual disturbance.  Respiratory: Negative for cough, chest tightness, shortness of breath and wheezing.   Cardiovascular: Negative for chest pain, palpitations and leg swelling.  Gastrointestinal: Negative for abdominal distention, abdominal pain, blood in stool, constipation, diarrhea, nausea and vomiting.  Genitourinary: Negative for difficulty urinating and hematuria.  Musculoskeletal: Negative for arthralgias, myalgias and neck pain.  Skin: Negative for rash.  Neurological: Negative for dizziness, seizures, syncope and headaches.  Hematological: Negative for adenopathy. Does not bruise/bleed easily.  Psychiatric/Behavioral: Negative for dysphoric mood. The patient is not nervous/anxious.        Objective:    BP 112/86 (BP Location: Right Arm, Patient Position: Sitting, Cuff Size: Normal)   Pulse (!) 59   Temp 98.6 F (37 C) (Oral)   Ht 5' 0.5" (1.537 m) Comment: no shoes  Wt 193 lb 4  oz (87.7 kg)   SpO2 96%   BMI 37.12 kg/m   Wt Readings from Last 3 Encounters:  06/28/17 193 lb 4 oz (87.7 kg)  06/28/17 193 lb 4 oz (87.7 kg)  09/22/16 186 lb 8 oz (84.6 kg)    BP Readings from Last 3 Encounters:  06/28/17 112/86  06/28/17 112/86  09/22/16 126/82   Physical Exam  Constitutional: She is oriented to person, place, and time. She appears well-developed and well-nourished. No distress.  HENT:  Head: Normocephalic and atraumatic.  Right Ear: Hearing, tympanic membrane, external ear and ear canal normal.  Left Ear: Hearing, tympanic membrane, external ear and ear canal normal.  Nose: Nose normal.  Mouth/Throat: Uvula is midline, oropharynx is clear and moist and mucous membranes are normal. No oropharyngeal exudate, posterior oropharyngeal edema or posterior oropharyngeal erythema.  Eyes: Pupils are equal, round, and reactive to light. Conjunctivae and EOM are normal. No scleral icterus.  Neck: Normal range of motion.  Neck supple.  Cardiovascular: Normal rate, regular rhythm, normal heart sounds and intact distal pulses.   No murmur heard. Pulses:      Radial pulses are 2+ on the right side, and 2+ on the left side.  Pulmonary/Chest: Effort normal and breath sounds normal. No respiratory distress. She has no wheezes. She has no rales.  Abdominal: Soft. Bowel sounds are normal. She exhibits no distension and no mass. There is no tenderness. There is no rebound and no guarding.  Musculoskeletal: Normal range of motion. She exhibits no edema.  Lymphadenopathy:    She has no cervical adenopathy.  Neurological: She is alert and oriented to person, place, and time.  CN grossly intact, station and gait intact  Skin: Skin is warm and dry. No rash noted.  Psychiatric: She has a normal mood and affect. Her behavior is normal. Judgment and thought content normal.  Nursing note and vitals reviewed.  Results for orders placed or performed in visit on 04/21/16  Lipid panel    Result Value Ref Range   Cholesterol 203 (H) 0 - 200 mg/dL   Triglycerides 99.0 0.0 - 149.0 mg/dL   HDL 54.40 >39.00 mg/dL   VLDL 19.8 0.0 - 40.0 mg/dL   LDL Cholesterol 129 (H) 0 - 99 mg/dL   Total CHOL/HDL Ratio 4    NonHDL 148.95   TSH  Result Value Ref Range   TSH 1.31 0.35 - 4.50 uIU/mL  Basic metabolic panel  Result Value Ref Range   Sodium 140 135 - 145 mEq/L   Potassium 4.0 3.5 - 5.1 mEq/L   Chloride 102 96 - 112 mEq/L   CO2 30 19 - 32 mEq/L   Glucose, Bld 99 70 - 99 mg/dL   BUN 13 6 - 23 mg/dL   Creatinine, Ser 0.95 0.40 - 1.20 mg/dL   Calcium 10.0 8.4 - 10.5 mg/dL   GFR 72.91 >60.00 mL/min  CBC with Differential/Platelet  Result Value Ref Range   WBC 10.0 4.0 - 10.5 K/uL   RBC 4.10 3.87 - 5.11 Mil/uL   Hemoglobin 12.2 12.0 - 15.0 g/dL   HCT 36.6 36.0 - 46.0 %   MCV 89.3 78.0 - 100.0 fl   MCHC 33.2 30.0 - 36.0 g/dL   RDW 13.9 11.5 - 15.5 %   Platelets 232.0 150.0 - 400.0 K/uL   Neutrophils Relative % 67.4 43.0 - 77.0 %   Lymphocytes Relative 23.7 12.0 - 46.0 %   Monocytes Relative 6.9 3.0 - 12.0 %   Eosinophils Relative 1.6 0.0 - 5.0 %   Basophils Relative 0.4 0.0 - 3.0 %   Neutro Abs 6.7 1.4 - 7.7 K/uL   Lymphs Abs 2.4 0.7 - 4.0 K/uL   Monocytes Absolute 0.7 0.1 - 1.0 K/uL   Eosinophils Absolute 0.2 0.0 - 0.7 K/uL   Basophils Absolute 0.0 0.0 - 0.1 K/uL      Assessment & Plan:   Problem List Items Addressed This Visit    Advanced care planning/counseling discussion    Advanced directive: has not set up. Has discussed with family her wishes. Would want Jannet Mantis daughter in [place] to be HCPOA. Wants full code but no prolonged life support if terminal diagnosis.       Atherosclerosis of abdominal aorta (HCC)    Continue aspirin, statin.       Relevant Medications   metoprolol tartrate (LOPRESSOR) 25 MG tablet   amLODipine (NORVASC) 10 MG tablet   CAD (coronary artery disease)  Continue aspirin, statin.       Relevant Medications    metoprolol tartrate (LOPRESSOR) 25 MG tablet   amLODipine (NORVASC) 10 MG tablet   Fibromyalgia    Stable with voltaren and vicks rub. Now off tramadol.  She has increased gabapentin to QID dosing with good effect. Will continue.       Generalized abdominal pain    This and nausea has significantly improved off regular tramadol.       Health maintenance examination - Primary    Preventative protocols reviewed and updated unless pt declined. Discussed healthy diet and lifestyle.       HLD (hyperlipidemia)    Chronic, stable. Continue statin MWF. Update FLP today.       Relevant Medications   metoprolol tartrate (LOPRESSOR) 25 MG tablet   amLODipine (NORVASC) 10 MG tablet   HTN (hypertension)    Chronic, stable. Oh by the way - pt restarted amloipine - will refill 10mg  dose. Given great control to date - advised trial 12.5mg  metoprolol BID lower dose. Continue other regimen.       Relevant Medications   metoprolol tartrate (LOPRESSOR) 25 MG tablet   amLODipine (NORVASC) 10 MG tablet   Urinary incontinence    Will increase oxybutynin 10mg  XL to 2 tablets daily given ongoing intermittent urinary urgency incontinence. Advised monitor for anticholinergic symptoms.          Follow up plan: Return in about 6 months (around 12/27/2017) for follow up visit.  Ria Bush, MD

## 2017-06-28 NOTE — Assessment & Plan Note (Signed)
Preventative protocols reviewed and updated unless pt declined. Discussed healthy diet and lifestyle.  

## 2017-07-01 NOTE — Progress Notes (Signed)
I reviewed health advisor's note, was available for consultation, and agree with documentation and plan.  

## 2017-08-31 ENCOUNTER — Other Ambulatory Visit: Payer: Self-pay | Admitting: Family Medicine

## 2017-08-31 ENCOUNTER — Other Ambulatory Visit: Payer: Self-pay

## 2017-08-31 MED ORDER — OXYBUTYNIN CHLORIDE ER 10 MG PO TB24: 20.0000 mg | ORAL_TABLET | Freq: Every day | ORAL | 1 refills | Status: DC

## 2017-11-01 ENCOUNTER — Other Ambulatory Visit: Payer: Self-pay | Admitting: Family Medicine

## 2017-11-03 ENCOUNTER — Other Ambulatory Visit: Payer: Self-pay | Admitting: Family Medicine

## 2017-12-27 ENCOUNTER — Ambulatory Visit: Payer: Medicare PPO | Admitting: Family Medicine

## 2018-01-02 ENCOUNTER — Other Ambulatory Visit: Payer: Self-pay | Admitting: Family Medicine

## 2018-01-24 ENCOUNTER — Ambulatory Visit: Payer: Medicare PPO | Admitting: Family Medicine

## 2018-02-13 ENCOUNTER — Ambulatory Visit: Payer: Medicare PPO | Admitting: Family Medicine

## 2018-02-13 ENCOUNTER — Ambulatory Visit (INDEPENDENT_AMBULATORY_CARE_PROVIDER_SITE_OTHER)
Admission: RE | Admit: 2018-02-13 | Discharge: 2018-02-13 | Disposition: A | Discharge status: Home / Self Care | Payer: Medicare PPO | Source: Ambulatory Visit | Attending: Family Medicine | Admitting: Family Medicine

## 2018-02-13 ENCOUNTER — Encounter: Payer: Self-pay | Admitting: Family Medicine

## 2018-02-13 VITALS — BP 130/84 | HR 75 | Temp 98.4°F | Ht 60.5 in | Wt 184.5 lb

## 2018-02-13 DIAGNOSIS — K8689 Other specified diseases of pancreas: Secondary | ICD-10-CM

## 2018-02-13 DIAGNOSIS — K219 Gastro-esophageal reflux disease without esophagitis: Secondary | ICD-10-CM | POA: Diagnosis not present

## 2018-02-13 DIAGNOSIS — M25561 Pain in right knee: Secondary | ICD-10-CM | POA: Diagnosis not present

## 2018-02-13 DIAGNOSIS — M797 Fibromyalgia: Secondary | ICD-10-CM | POA: Diagnosis not present

## 2018-02-13 DIAGNOSIS — E559 Vitamin D deficiency, unspecified: Secondary | ICD-10-CM

## 2018-02-13 DIAGNOSIS — R600 Localized edema: Secondary | ICD-10-CM

## 2018-02-13 DIAGNOSIS — M1711 Unilateral primary osteoarthritis, right knee: Secondary | ICD-10-CM | POA: Insufficient documentation

## 2018-02-13 DIAGNOSIS — D509 Iron deficiency anemia, unspecified: Secondary | ICD-10-CM | POA: Diagnosis not present

## 2018-02-13 DIAGNOSIS — I10 Essential (primary) hypertension: Secondary | ICD-10-CM

## 2018-02-13 DIAGNOSIS — R21 Rash and other nonspecific skin eruption: Secondary | ICD-10-CM | POA: Diagnosis not present

## 2018-02-13 LAB — CBC WITH DIFFERENTIAL/PLATELET
BASOS ABS: 0 10*3/uL (ref 0.0–0.1)
Basophils Relative: 0.5 % (ref 0.0–3.0)
EOS PCT: 2.2 % (ref 0.0–5.0)
Eosinophils Absolute: 0.2 10*3/uL (ref 0.0–0.7)
HEMATOCRIT: 37.5 % (ref 36.0–46.0)
Hemoglobin: 12.4 g/dL (ref 12.0–15.0)
LYMPHS PCT: 32.6 % (ref 12.0–46.0)
Lymphs Abs: 2.7 10*3/uL (ref 0.7–4.0)
MCHC: 33.2 g/dL (ref 30.0–36.0)
MCV: 89.8 fl (ref 78.0–100.0)
MONOS PCT: 7.2 % (ref 3.0–12.0)
Monocytes Absolute: 0.6 10*3/uL (ref 0.1–1.0)
NEUTROS ABS: 4.8 10*3/uL (ref 1.4–7.7)
Neutrophils Relative %: 57.5 % (ref 43.0–77.0)
PLATELETS: 224 10*3/uL (ref 150.0–400.0)
RBC: 4.17 Mil/uL (ref 3.87–5.11)
RDW: 14.7 % (ref 11.5–15.5)
WBC: 8.3 10*3/uL (ref 4.0–10.5)

## 2018-02-13 LAB — COMPREHENSIVE METABOLIC PANEL
ALT: 5 U/L (ref 0–35)
AST: 9 U/L (ref 0–37)
Albumin: 4.2 g/dL (ref 3.5–5.2)
Alkaline Phosphatase: 81 U/L (ref 39–117)
BUN: 15 mg/dL (ref 6–23)
CHLORIDE: 102 meq/L (ref 96–112)
CO2: 30 meq/L (ref 19–32)
Calcium: 9.9 mg/dL (ref 8.4–10.5)
Creatinine, Ser: 0.99 mg/dL (ref 0.40–1.20)
GFR: 69.2 mL/min (ref >60.00)
GLUCOSE: 101 mg/dL — AB (ref 70–99)
Potassium: 3.8 mEq/L (ref 3.5–5.1)
SODIUM: 140 meq/L (ref 135–145)
Total Bilirubin: 0.4 mg/dL (ref 0.2–1.2)
Total Protein: 7.3 g/dL (ref 6.0–8.3)

## 2018-02-13 LAB — VITAMIN D 25 HYDROXY (VIT D DEFICIENCY, FRACTURES): VITD: 17.07 ng/mL — ABNORMAL LOW (ref 30.00–100.00)

## 2018-02-13 LAB — TSH: TSH: 1.99 u[IU]/mL (ref 0.35–4.50)

## 2018-02-13 IMAGING — DX DG KNEE COMPLETE 4+V*R*
5 series · 5 of 5 positions shown · non-contrast
Comparison: Extremity ultrasound dated [DATE]

CLINICAL DATA: Right knee pain after fall 6 weeks ago.

EXAM:
RIGHT KNEE - COMPLETE 4+ VIEW

[knee ap]
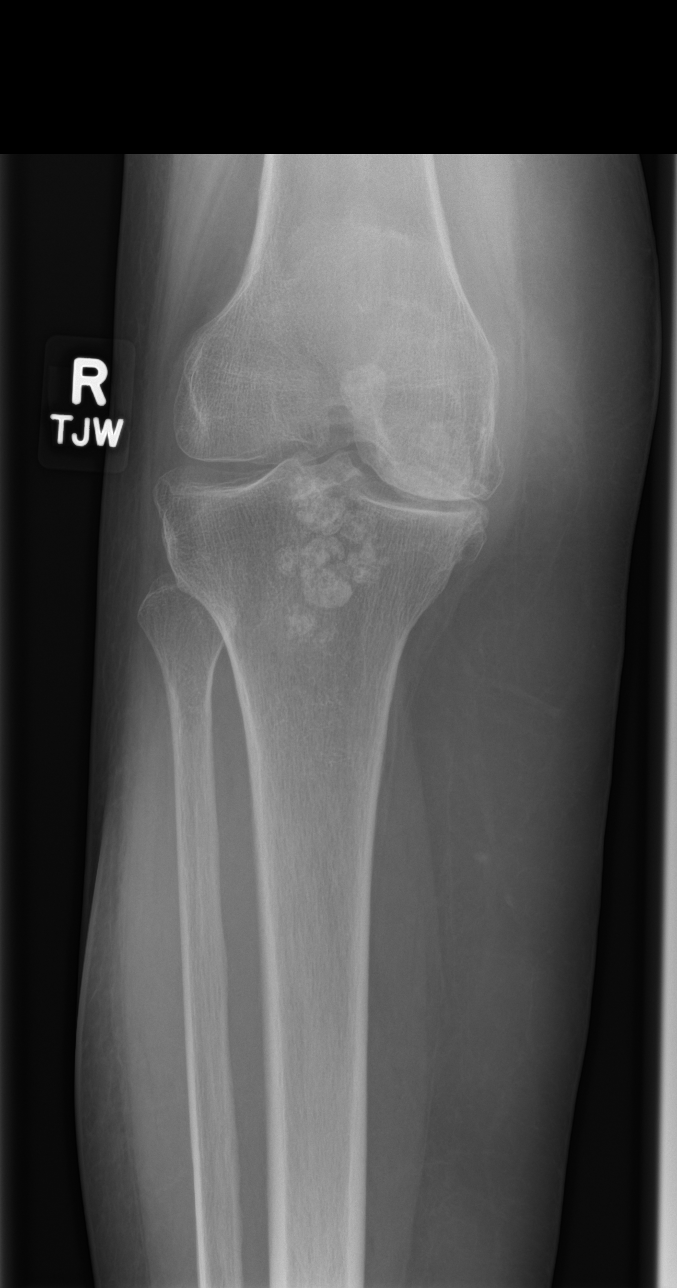

[knee lat]
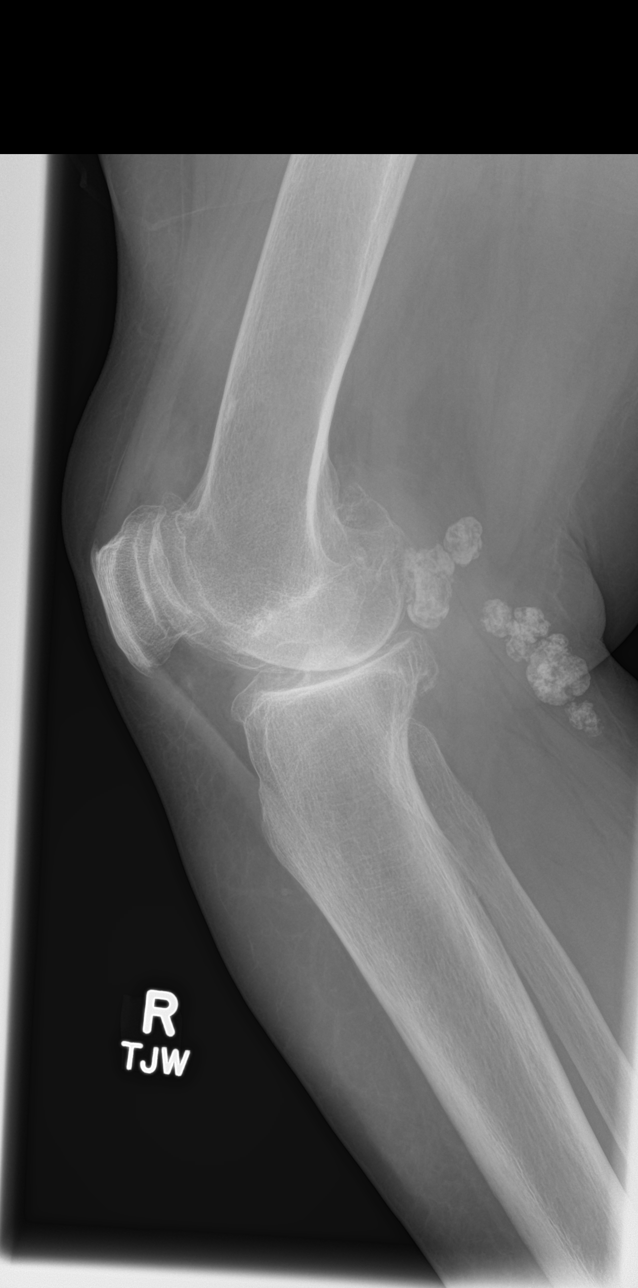

[patella skyline]
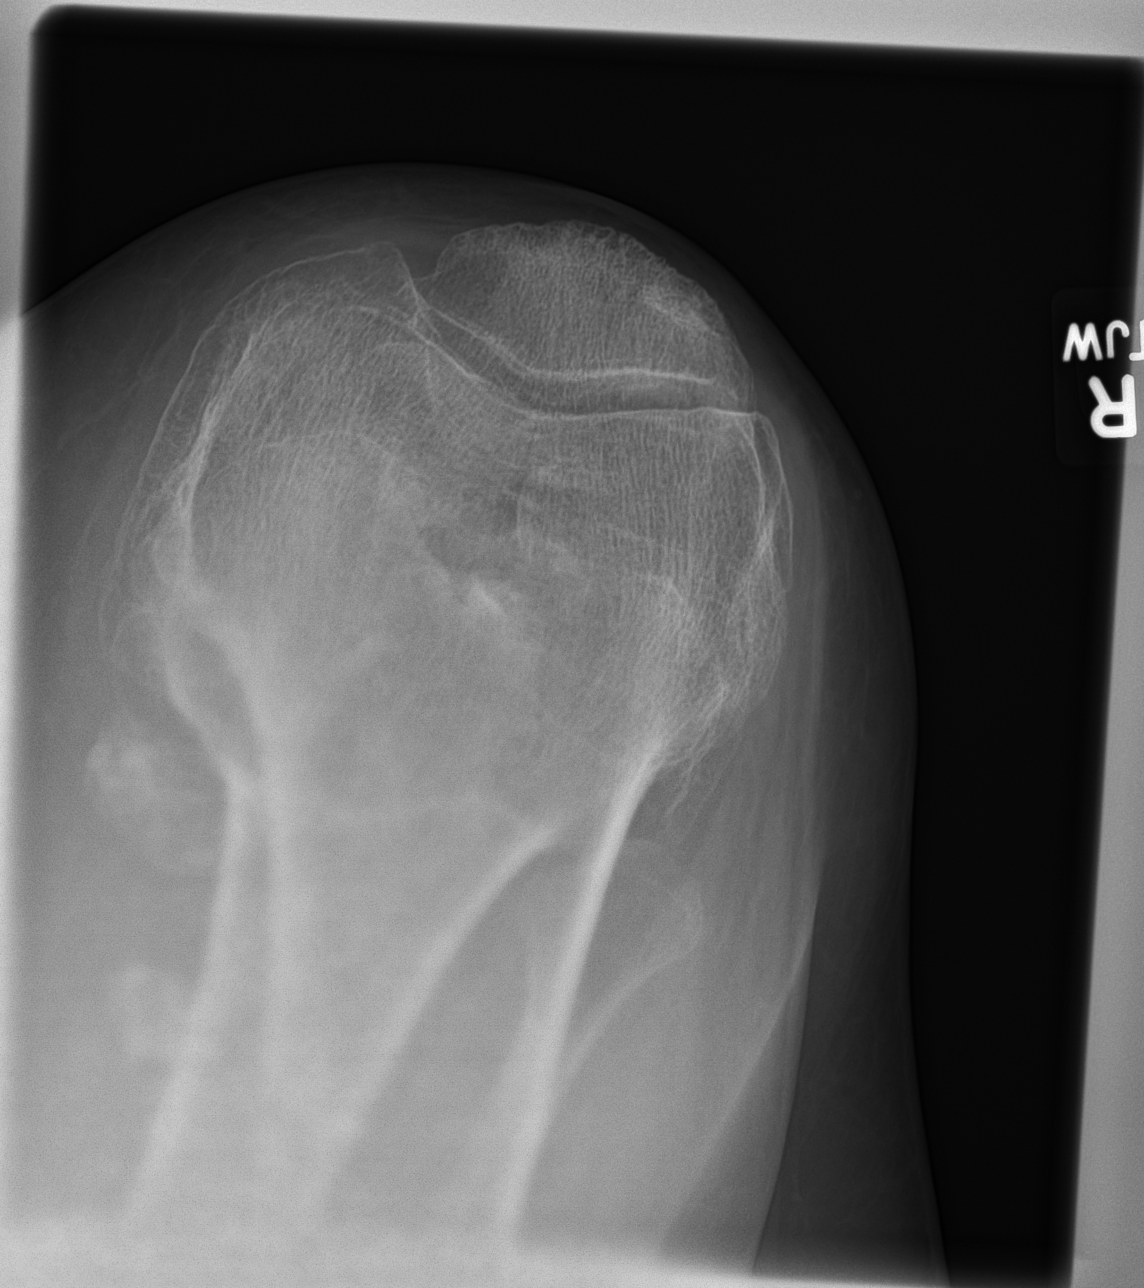

[knee obl (1 of 2)]
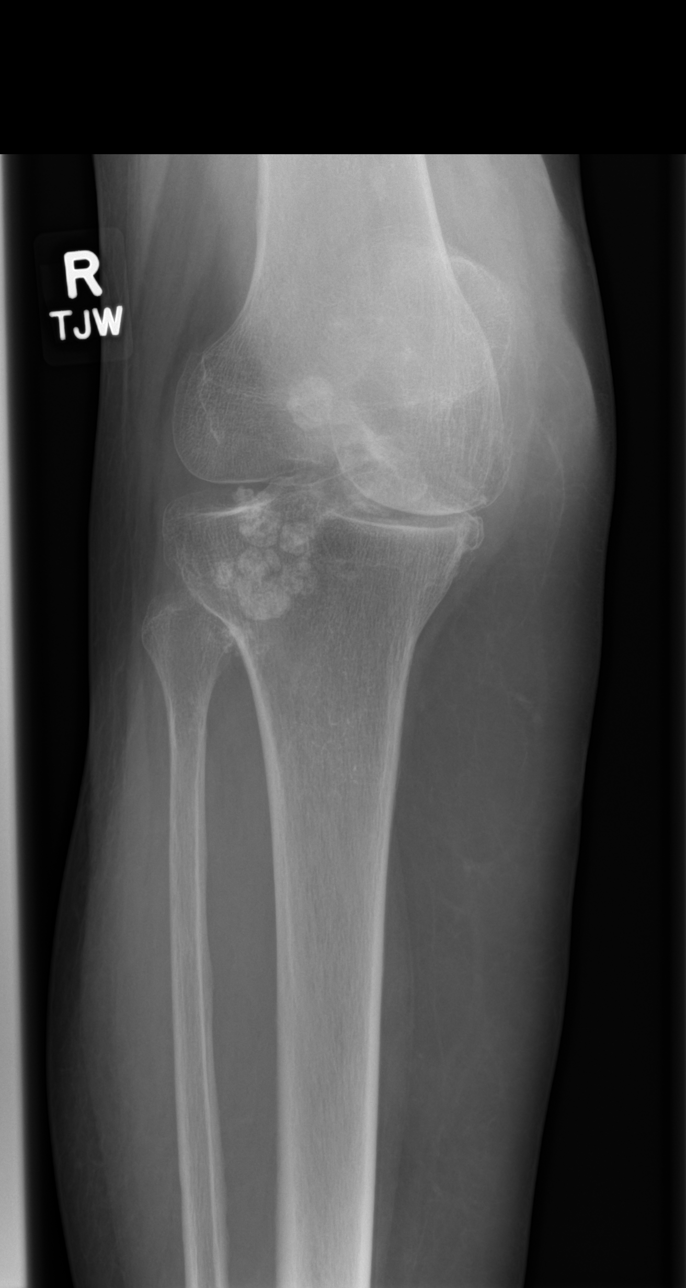

[knee obl (2 of 2)]
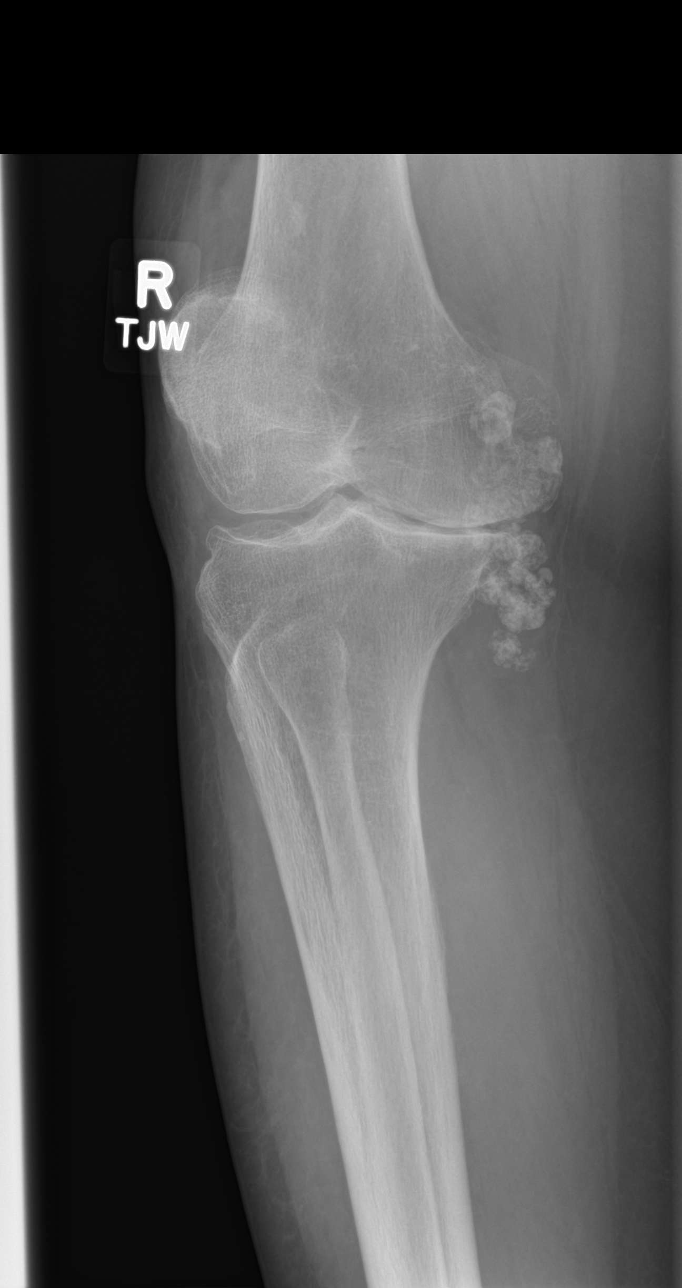

[5 of 5 positions shown; findings below may reference images not displayed]

FINDINGS: No evidence of fracture, or dislocation. Small suprapatellar joint
effusion. Severe 3 compartment osteoarthritic changes of the knee.
Soft tissue calcifications posteriorly to the knee joint likely
represent calcified displaced intra-articular bodies into a Baker's
cyst, in correlation to extremity ultrasound.
IMPRESSION: No acute fracture or dislocation identified about the right knee.

Severe 3 compartment osteoarthritis.

Calcified loose bodies within Baker's cyst.

## 2018-02-13 MED ORDER — GABAPENTIN 300 MG PO CAPS: 300.0000 mg | ORAL_CAPSULE | Freq: Four times a day (QID) | ORAL | 1 refills | Status: DC

## 2018-02-13 MED ORDER — MUPIROCIN CALCIUM 2 % EX CREA: 1.0000 "application " | TOPICAL_CREAM | Freq: Two times a day (BID) | CUTANEOUS | 0 refills | Status: DC

## 2018-02-13 MED ORDER — ESOMEPRAZOLE MAGNESIUM 40 MG PO CPDR: 40.0000 mg | DELAYED_RELEASE_CAPSULE | Freq: Every day | ORAL | 1 refills | Status: DC

## 2018-02-13 NOTE — Assessment & Plan Note (Signed)
Update labs.  

## 2018-02-13 NOTE — Patient Instructions (Addendum)
You do have baker's cyst. Try tylenol 500mg  twice daily for knee pain. Buy knee sleeve brace for right knee extra support.  Xray of right knee today. Labs today.  For facial peeling, try topical antibiotic cream sent to pharmacy. If no better, let us know for dermatology referral.  I've refilled nexium - restart this for heartburn symptoms.  Return to see me in 3 months, sooner if needed.

## 2018-02-13 NOTE — Progress Notes (Signed)
BP 130/84 (BP Location: Left Arm, Patient Position: Sitting, Cuff Size: Normal)   Pulse 75   Temp 98.4 F (36.9 C) (Oral)   Ht 5' 0.5" (1.537 m)   Wt 184 lb 8 oz (83.7 kg)   SpO2 96%   BMI 35.44 kg/m    CC: 6 mo f/u visit Subjective:    Patient ID: Brianna Woods, female    DOB: 1935-01-18, 83 y.o.   MRN: [id number]  HPI: Brianna Woods is a 83 y.o. female presenting on 02/13/2018 for 6 mo follow up (Pt needs another walker.); Facial Swelling (Noticed swelling and peeling in left side of face on 02/09/18. Swelling has gone down and has started on right side. ); Fatigue (Lack of energy and not wanting to get out of bed.); and Alopecia   Doesn't feel well today - hair loss, facial swelling/peeling, fatigue, sense of impending doom.   Denies fevers/chills, swollen glands, sore throat or abd pain, cough or dyspnea. Denies unilateral weakness or numbness. Denies sinus congestion or sneezing. Denies diarrhea.   Woke up this weekend with L eye swelled shut then it started peeling. Now R eye started peeling/swelling. No eye pain. She treated this with hot compresses. She is worried a stink bug bit her.   No new lotions, creams, detergents, soaps or shampoos. No new supplements, foods.  Worsening R knee pain - worsened after fall 6 wks ago.   She is taking vitamin D 5000 units daily.  GERD - ran out of nexium 40mg  daily several weeks ago. Requests Rx for rolling walker through [facility].   Relevant past medical, surgical, family and social history reviewed and updated as indicated. Interim medical history since our last visit reviewed. Allergies and medications reviewed and updated. Outpatient Medications Prior to Visit  Medication Sig Dispense Refill  . albuterol (VENTOLIN HFA) 108 (90 BASE) MCG/ACT inhaler Inhale 2 puffs into the lungs every 6 (six) hours as needed. for wheezing 54 g 3  . amLODipine (NORVASC) 10 MG tablet Take 1 tablet (10 mg total) by mouth daily. 90 tablet 3  . aspirin EC 81 MG  tablet Take 1 tablet (81 mg total) by mouth daily.    Marland Kitchen atorvastatin (LIPITOR) 40 MG tablet TAKE 1 TABLET EVERY MONDAY, WEDNESDAY AND FRIDAY. 39 tablet 3  . cholecalciferol 5000 UNITS TABS Take 5,000 Units by mouth daily. 90 tablet 3  . cloNIDine (CATAPRES) 0.1 MG tablet TAKE 1 TABLET TWICE DAILY AS NEEDED. TAKE ONE DAILY IN THE MORNING, SECOND IF BLOOD PRESSURE IS GREATER THAN 150/100 180 tablet 1  . diclofenac sodium (VOLTAREN) 1 % GEL APPLY 4 GRAMS TOPICALLY FOUR TIMES DAILY AS NEEDED. 100 g 0  . metoprolol tartrate (LOPRESSOR) 25 MG tablet TAKE 1 TABLET TWICE DAILY 180 tablet 1  . ondansetron (ZOFRAN) 4 MG tablet TAKE 1 TABLET EVERY 12 HOURS AS NEEDED FOR NAUSEA 40 tablet 2  . oxybutynin (DITROPAN-XL) 10 MG 24 hr tablet TAKE 2 TABLETS EVERY DAY 180 tablet 0  . potassium chloride (K-DUR) 10 MEQ tablet Take 1 tablet (10 mEq total) by mouth daily as needed (take with lasix). 90 tablet 1  . ranitidine (ZANTAC) 300 MG tablet TAKE 1 TABLET AT BEDTIME 90 tablet 1  . traZODone (DESYREL) 100 MG tablet TAKE 1/2 TO 1 TABLET AT BEDTIME 90 tablet 2  . vitamin C (ASCORBIC ACID) 500 MG tablet Take 500 mg by mouth daily.    Marland Kitchen esomeprazole (NEXIUM) 40 MG capsule TAKE 1 CAPSULE DAILY (NOTIFY MD IF  ANY BREAKTHROUGH SYMPTOMS ON DAILY DOSING AS DIRECTED) 90 capsule 1  . gabapentin (NEURONTIN) 300 MG capsule Take 1 capsule (300 mg total) by mouth 4 (four) times daily. 360 capsule 3  . sucralfate (CARAFATE) 1 G tablet Take 1 tablet (1 g total) by mouth 2 (two) times daily. (Patient taking differently: Take 1 g by mouth daily. ) 360 tablet 3  . furosemide (LASIX) 20 MG tablet Take 1 tablet (20 mg total) by mouth daily as needed for edema. 90 tablet 1   No facility-administered medications prior to visit.      Per HPI unless specifically indicated in ROS section below Review of Systems     Objective:    BP 130/84 (BP Location: Left Arm, Patient Position: Sitting, Cuff Size: Normal)   Pulse 75   Temp 98.4 F  (36.9 C) (Oral)   Ht 5' 0.5" (1.537 m)   Wt 184 lb 8 oz (83.7 kg)   SpO2 96%   BMI 35.44 kg/m   Wt Readings from Last 3 Encounters:  02/13/18 184 lb 8 oz (83.7 kg)  06/28/17 193 lb 4 oz (87.7 kg)  06/28/17 193 lb 4 oz (87.7 kg)    Physical Exam  Constitutional: She appears well-developed and well-nourished. No distress.  HENT:  Head: Normocephalic and atraumatic.  Right Ear: Hearing, tympanic membrane, external ear and ear canal normal.  Left Ear: Hearing, tympanic membrane, external ear and ear canal normal.  Nose: No mucosal edema or rhinorrhea. Right sinus exhibits no maxillary sinus tenderness and no frontal sinus tenderness. Left sinus exhibits no maxillary sinus tenderness and no frontal sinus tenderness.  Mouth/Throat: Uvula is midline, oropharynx is clear and moist and mucous membranes are normal. No oropharyngeal exudate, posterior oropharyngeal edema, posterior oropharyngeal erythema or tonsillar abscesses.  Eyes: Pupils are equal, round, and reactive to light. Conjunctivae and EOM are normal. No scleral icterus.  Neck: Normal range of motion. Neck supple. No thyromegaly present.  Cardiovascular: Normal rate, regular rhythm, normal heart sounds and intact distal pulses.  No murmur heard. Pulmonary/Chest: Effort normal and breath sounds normal. No respiratory distress. She has no wheezes. She has no rales.  Abdominal: Soft. Bowel sounds are normal. She exhibits no distension. There is no tenderness. There is no rebound and no guarding. No hernia.  Musculoskeletal: She exhibits no edema.  Limited ROM R knee 2/2 pain. Tender fullness popliteal region of right knee. Tender to palpation R knee joint lines.   Lymphadenopathy:    She has no cervical adenopathy.  Skin: Skin is warm and dry. No rash noted. There is erythema (mild L cheek).  Mild blistering/peeling bilateral cheeks lateral to eyes  Psychiatric: She has a normal mood and affect.  Nursing note and vitals  reviewed.  Results for orders placed or performed in visit on 06/28/17  Lipid panel  Result Value Ref Range   Cholesterol 204 (H) 0 - 200 mg/dL   Triglycerides 106.0 0.0 - 149.0 mg/dL   HDL 45.30 >39.00 mg/dL   VLDL 21.2 0.0 - 40.0 mg/dL   LDL Cholesterol 138 (H) 0 - 99 mg/dL   Total CHOL/HDL Ratio 5    NonHDL 158.92   Comprehensive metabolic panel  Result Value Ref Range   Sodium 139 135 - 145 mEq/L   Potassium 3.8 3.5 - 5.1 mEq/L   Chloride 105 96 - 112 mEq/L   CO2 27 19 - 32 mEq/L   Glucose, Bld 104 (H) 70 - 99 mg/dL   BUN 8 6 -  23 mg/dL   Creatinine, Ser 0.84 0.40 - 1.20 mg/dL   Total Bilirubin 0.5 0.2 - 1.2 mg/dL   Alkaline Phosphatase 80 39 - 117 U/L   AST 16 0 - 37 U/L   ALT 10 0 - 35 U/L   Total Protein 7.0 6.0 - 8.3 g/dL   Albumin 3.9 3.5 - 5.2 g/dL   Calcium 9.3 8.4 - 10.5 mg/dL   GFR 83.78 >60.00 mL/min  TSH  Result Value Ref Range   TSH 2.15 0.35 - 4.50 uIU/mL  VITAMIN D 25 Hydroxy (Vit-D Deficiency, Fractures)  Result Value Ref Range   VITD 16.06 (L) 30.00 - 100.00 ng/mL  CBC with Differential/Platelet  Result Value Ref Range   WBC 6.7 4.0 - 10.5 K/uL   RBC 3.98 3.87 - 5.11 Mil/uL   Hemoglobin 11.8 (L) 12.0 - 15.0 g/dL   HCT 36.1 36.0 - 46.0 %   MCV 90.6 78.0 - 100.0 fl   MCHC 32.7 30.0 - 36.0 g/dL   RDW 15.3 11.5 - 15.5 %   Platelets 224.0 150.0 - 400.0 K/uL   Neutrophils Relative % 64.2 43.0 - 77.0 %   Lymphocytes Relative 27.4 12.0 - 46.0 %   Monocytes Relative 5.9 3.0 - 12.0 %   Eosinophils Relative 2.1 0.0 - 5.0 %   Basophils Relative 0.4 0.0 - 3.0 %   Neutro Abs 4.3 1.4 - 7.7 K/uL   Lymphs Abs 1.8 0.7 - 4.0 K/uL   Monocytes Absolute 0.4 0.1 - 1.0 K/uL   Eosinophils Absolute 0.1 0.0 - 0.7 K/uL   Basophils Absolute 0.0 0.0 - 0.1 K/uL  Lipase  Result Value Ref Range   Lipase 4.0 (L) 11.0 - 59.0 U/L      Assessment & Plan:  Rollator walker Rx provided today.  Problem List Items Addressed This Visit    Acute pain of right knee - Primary     Anticipate OA flare after fall with evident baker's cyst on exam. Will rec knee brace, tylenol scheduled. Update if not improving.       Relevant Orders   DG Knee Complete 4 Views Right   Fatty pancreas    Update labs.       Relevant Orders   Comprehensive metabolic panel   CBC with Differential/Platelet   Fibromyalgia   GERD (gastroesophageal reflux disease)    Pt unsure if she's taking nexium - will check at home. Refilled today to [employer] order.       Relevant Medications   esomeprazole (NEXIUM) 40 MG capsule   HTN (hypertension)    Chronic stable. Continue current regimen.       Relevant Orders   TSH   IDA (iron deficiency anemia)    Update labs.       Pedal edema    No significant recent trouble.      Skin rash    Peeling of skin bilateral cheeks and peri-orbitally.  Will try Rx mupirocin. Update if not improving for referral to derm. Pt agrees with plan.       Vitamin D deficiency    Chronic, pt states compliance with vit D 5000 IU daily. Will recheck levels.       Relevant Orders   VITAMIN D 25 Hydroxy (Vit-D Deficiency, Fractures)       Meds ordered this encounter  Medications  . esomeprazole (NEXIUM) 40 MG capsule    Sig: Take 1 capsule (40 mg total) by mouth daily at 12 noon.  Dispense:  90 capsule    Refill:  1  . gabapentin (NEURONTIN) 300 MG capsule    Sig: Take 1 capsule (300 mg total) by mouth 4 (four) times daily.    Dispense:  360 capsule    Refill:  1  . mupirocin cream (BACTROBAN) 2 %    Sig: Apply 1 application topically 2 (two) times daily.    Dispense:  30 g    Refill:  0   Orders Placed This Encounter  Procedures  . DG Knee Complete 4 Views Right    Standing Status:   Future    Number of Occurrences:   1    Standing Expiration Date:   04/16/2019    Order Specific Question:   Reason for Exam (SYMPTOM  OR DIAGNOSIS REQUIRED)    Answer:   R knee pain after fall 6 wks ago with baker's cyst    Order Specific Question:    Preferred imaging location?    Answer:   [facility]    Order Specific Question:   Radiology Contrast Protocol - do NOT remove file path    Answer:   [email or web address]  . Comprehensive metabolic panel  . TSH  . CBC with Differential/Platelet  . VITAMIN D 25 Hydroxy (Vit-D Deficiency, Fractures)    Follow up plan: Return in about 3 months (around 05/16/2018) for follow up visit.  Ria Bush, MD

## 2018-02-13 NOTE — Assessment & Plan Note (Signed)
Pt unsure if she's taking nexium - will check at home. Refilled today to [employer] order.

## 2018-02-13 NOTE — Assessment & Plan Note (Signed)
Chronic, pt states compliance with vit D 5000 IU daily. Will recheck levels.

## 2018-02-13 NOTE — Assessment & Plan Note (Signed)
Anticipate OA flare after fall with evident baker's cyst on exam. Will rec knee brace, tylenol scheduled. Update if not improving.

## 2018-02-13 NOTE — Assessment & Plan Note (Addendum)
Peeling of skin bilateral cheeks and peri-orbitally.  Will try Rx mupirocin. Update if not improving for referral to derm. Pt agrees with plan.

## 2018-02-13 NOTE — Assessment & Plan Note (Signed)
Chronic stable. Continue current regimen. 

## 2018-02-13 NOTE — Assessment & Plan Note (Signed)
No significant recent trouble.

## 2018-03-08 ENCOUNTER — Other Ambulatory Visit: Payer: Self-pay | Admitting: Family Medicine

## 2018-04-23 DIAGNOSIS — M545 Low back pain: Secondary | ICD-10-CM | POA: Diagnosis not present

## 2018-04-23 DIAGNOSIS — M549 Dorsalgia, unspecified: Secondary | ICD-10-CM | POA: Diagnosis not present

## 2018-04-23 DIAGNOSIS — M1711 Unilateral primary osteoarthritis, right knee: Secondary | ICD-10-CM | POA: Diagnosis not present

## 2018-04-26 ENCOUNTER — Telehealth: Payer: Self-pay | Admitting: Family Medicine

## 2018-04-26 NOTE — Telephone Encounter (Signed)
Copied from [place] [id number]. Topic: General - Other >> Apr 26, 2018  4:04 PM Margot Ables wrote: Reason for CRM: 3 page fax was sent to Dr. Danise Mina 04/22/18 to [phone] for orthopedic braces for pt knee and back. Requesting return fax with approval for equipment to fax # [phone].

## 2018-04-26 NOTE — Telephone Encounter (Signed)
Dr. Darnell Level, do you have this request?

## 2018-04-29 NOTE — Telephone Encounter (Signed)
Noted.  Spoke with pt asking about DME request.  Confirms she is trying to get the knee and back brace from a company in [place].  Informed her we have not received the form. Says she will contact them tomorrow and have them fax form to our office.

## 2018-04-29 NOTE — Telephone Encounter (Signed)
I do not. This needs to be verified that request came from patient, not just from [place].

## 2018-05-02 NOTE — Telephone Encounter (Addendum)
Attempted to contact pt. No answer. No vm.  Need to find out if pt was able to have the medical supply company fax [place] an order for the knee and back brace she is requesting.

## 2018-05-06 NOTE — Telephone Encounter (Signed)
Spoke with pt informing her we still have not received a fax from the medical supply business.  Says she received a package with the items she requested.  However, she will call the company and provide our office fax # so they can send to the order to Dr. Darnell Level.

## 2018-05-13 ENCOUNTER — Other Ambulatory Visit: Payer: Self-pay | Admitting: Family Medicine

## 2018-05-16 ENCOUNTER — Ambulatory Visit: Payer: Medicare PPO | Admitting: Family Medicine

## 2018-05-16 DIAGNOSIS — Z0289 Encounter for other administrative examinations: Secondary | ICD-10-CM

## 2018-05-20 ENCOUNTER — Ambulatory Visit: Payer: Medicare PPO | Admitting: Family Medicine

## 2018-05-23 ENCOUNTER — Ambulatory Visit: Payer: Medicare PPO | Admitting: Family Medicine

## 2018-05-23 ENCOUNTER — Encounter: Payer: Self-pay | Admitting: Family Medicine

## 2018-05-23 VITALS — BP 158/88 | HR 95 | Temp 98.9°F | Ht 65.0 in | Wt 177.5 lb

## 2018-05-23 DIAGNOSIS — R1310 Dysphagia, unspecified: Secondary | ICD-10-CM | POA: Insufficient documentation

## 2018-05-23 DIAGNOSIS — M1711 Unilateral primary osteoarthritis, right knee: Secondary | ICD-10-CM | POA: Diagnosis not present

## 2018-05-23 DIAGNOSIS — G4700 Insomnia, unspecified: Secondary | ICD-10-CM | POA: Insufficient documentation

## 2018-05-23 DIAGNOSIS — R9389 Abnormal findings on diagnostic imaging of other specified body structures: Secondary | ICD-10-CM | POA: Diagnosis not present

## 2018-05-23 DIAGNOSIS — K2100 Gastro-esophageal reflux disease with esophagitis, without bleeding: Secondary | ICD-10-CM

## 2018-05-23 DIAGNOSIS — N858 Other specified noninflammatory disorders of uterus: Secondary | ICD-10-CM | POA: Insufficient documentation

## 2018-05-23 DIAGNOSIS — R634 Abnormal weight loss: Secondary | ICD-10-CM | POA: Diagnosis not present

## 2018-05-23 DIAGNOSIS — I10 Essential (primary) hypertension: Secondary | ICD-10-CM | POA: Diagnosis not present

## 2018-05-23 DIAGNOSIS — E559 Vitamin D deficiency, unspecified: Secondary | ICD-10-CM | POA: Diagnosis not present

## 2018-05-23 DIAGNOSIS — Z23 Encounter for immunization: Secondary | ICD-10-CM | POA: Diagnosis not present

## 2018-05-23 DIAGNOSIS — R1084 Generalized abdominal pain: Secondary | ICD-10-CM | POA: Diagnosis not present

## 2018-05-23 DIAGNOSIS — N859 Noninflammatory disorder of uterus, unspecified: Secondary | ICD-10-CM

## 2018-05-23 LAB — CBC WITH DIFFERENTIAL/PLATELET
Basophils Absolute: 0 10*3/uL (ref 0.0–0.1)
Basophils Relative: 0.4 % (ref 0.0–3.0)
EOS ABS: 0.1 10*3/uL (ref 0.0–0.7)
Eosinophils Relative: 1.2 % (ref 0.0–5.0)
HEMATOCRIT: 39.7 % (ref 36.0–46.0)
Hemoglobin: 13.3 g/dL (ref 12.0–15.0)
LYMPHS PCT: 26.4 % (ref 12.0–46.0)
Lymphs Abs: 2.8 10*3/uL (ref 0.7–4.0)
MCHC: 33.5 g/dL (ref 30.0–36.0)
MCV: 89.1 fl (ref 78.0–100.0)
MONOS PCT: 7.8 % (ref 3.0–12.0)
Monocytes Absolute: 0.8 10*3/uL (ref 0.1–1.0)
Neutro Abs: 6.8 10*3/uL (ref 1.4–7.7)
Neutrophils Relative %: 64.2 % (ref 43.0–77.0)
PLATELETS: 267 10*3/uL (ref 150.0–400.0)
RBC: 4.46 Mil/uL (ref 3.87–5.11)
RDW: 14.3 % (ref 11.5–15.5)
WBC: 10.6 10*3/uL — AB (ref 4.0–10.5)

## 2018-05-23 LAB — BASIC METABOLIC PANEL
BUN: 9 mg/dL (ref 6–23)
CALCIUM: 9.9 mg/dL (ref 8.4–10.5)
CO2: 29 mEq/L (ref 19–32)
CREATININE: 0.93 mg/dL (ref 0.40–1.20)
Chloride: 102 mEq/L (ref 96–112)
GFR: 74.33 mL/min (ref >60.00)
Glucose, Bld: 98 mg/dL (ref 70–99)
Potassium: 3.1 mEq/L — ABNORMAL LOW (ref 3.5–5.1)
SODIUM: 141 meq/L (ref 135–145)

## 2018-05-23 MED ORDER — ALBUTEROL SULFATE HFA 108 (90 BASE) MCG/ACT IN AERS: 2.0000 | INHALATION_SPRAY | Freq: Four times a day (QID) | RESPIRATORY_TRACT | 3 refills | Status: DC | PRN

## 2018-05-23 MED ORDER — ONDANSETRON HCL 4 MG PO TABS: ORAL_TABLET | ORAL | 1 refills | Status: DC

## 2018-05-23 MED ORDER — AMLODIPINE BESYLATE 5 MG PO TABS: 5.0000 mg | ORAL_TABLET | Freq: Every day | ORAL | 1 refills | Status: DC

## 2018-05-23 MED ORDER — MIRTAZAPINE 15 MG PO TABS: 15.0000 mg | ORAL_TABLET | Freq: Every day | ORAL | 6 refills | Status: DC

## 2018-05-23 MED ORDER — FUROSEMIDE 20 MG PO TABS: 20.0000 mg | ORAL_TABLET | Freq: Every day | ORAL | 1 refills | Status: DC | PRN

## 2018-05-23 MED ORDER — POTASSIUM CHLORIDE ER 10 MEQ PO TBCR: 10.0000 meq | EXTENDED_RELEASE_TABLET | Freq: Every day | ORAL | 1 refills | Status: DC | PRN

## 2018-05-23 NOTE — Assessment & Plan Note (Addendum)
BP elevated after she stopped amlodipine due to pedal edema - will restart at lower 5mg  dose.  Continue clonidine and metoprolol bid.  Will refill lasix and potassium PRN edema.

## 2018-05-23 NOTE — Assessment & Plan Note (Addendum)
Continues nexium daily.

## 2018-05-23 NOTE — Assessment & Plan Note (Addendum)
Known uterine fibroids and thickened endometrial stripe. Had previously seen GYN Dr Leonides Schanz, but resistant to biopsy. Unsure she ever proceeded with this. Update CT abd/pelvis today.

## 2018-05-23 NOTE — Assessment & Plan Note (Signed)
Prior on trazodone - will start mirtazapine in its place - hopeful for improvement in appetite with this medicine.

## 2018-05-23 NOTE — Patient Instructions (Addendum)
Flu shot today Try mirtazapine in place of trazodone. Restart norvasc at lower 5mg  dose.  We will refer you back to GI. We will check CT abdomen/pelvis today for weight loss.  Keep follow up appointment next month.  Decrease gabapentin to three times a day instead of 4.

## 2018-05-23 NOTE — Assessment & Plan Note (Signed)
Endorses progressive dysphagia despite daily nexium Will refer back to GI

## 2018-05-23 NOTE — Assessment & Plan Note (Addendum)
Acutely worse after fall suffered ~12/2017. Knee xray showed severe arthritis, calcified loose bodies in large baker's cyst.  She has new knee hinge brace - has not been wearing correctly. Placed today, showing how to use.  Next step will be ortho surgical eval.

## 2018-05-23 NOTE — Assessment & Plan Note (Addendum)
Presumed fibroid by imaging 2017, associated with thickened endometrial stripe.

## 2018-05-23 NOTE — Progress Notes (Signed)
BP (!) 158/88 (BP Location: Left Arm, Patient Position: Sitting, Cuff Size: Normal)   Pulse 95   Temp 98.9 F (37.2 C) (Oral)   Ht 5\' 5"  (1.651 m)   Wt 177 lb 8 oz (80.5 kg)   SpO2 95%   BMI 29.54 kg/m    CC: 3 mo f/u visit Subjective:    Patient ID: Brianna Woods, female    DOB: March 18, 1935, 83 y.o.   MRN: [id number]  HPI: Brianna Woods is a 83 y.o. female presenting on 05/23/2018 for 3 mo follow up (Wants to discuss Norvasc and rx for diverticulitis. )   Weight loss noted. Appetite down.  Notes worsening memory. Increasing imbalance.  Endorses seasonal depression. Increasing sense of impending doom.   HTN - stopped norvasc - caused leg swelling. Continue metoprolol 25mg  bid, clonidine 0.1mg  bid.   Requests trazodone refill for sleep.  Stays nauseated, vomiting intermittently (according to what she eats), dysphagia with choking - progressively worsening. Last EGD 2013 with esophagitis, no barrett's.  Denies fevers/chills, abd pain, diarrhea/constipation.   Ongoing R knee pain - has new brace she started using.   Known uterine mass - she never returned to see gynecologist.   Relevant past medical, surgical, family and social history reviewed and updated as indicated. Interim medical history since our last visit reviewed. Allergies and medications reviewed and updated. Outpatient Medications Prior to Visit  Medication Sig Dispense Refill  . aspirin EC 81 MG tablet Take 1 tablet (81 mg total) by mouth daily.    Marland Kitchen atorvastatin (LIPITOR) 40 MG tablet TAKE 1 TABLET EVERY MONDAY, WEDNESDAY AND FRIDAY. 39 tablet 3  . cholecalciferol 5000 UNITS TABS Take 5,000 Units by mouth daily. 90 tablet 3  . cloNIDine (CATAPRES) 0.1 MG tablet TAKE 1 TABLET TWICE DAILY AS NEEDED (1 TABLET IN THE MORNING AND A SECOND TABLET IF BLOOD PRESSURE OVER 150/100) 180 tablet 0  . diclofenac sodium (VOLTAREN) 1 % GEL APPLY 4 GRAMS TOPICALLY FOUR TIMES DAILY AS NEEDED. 100 g 0  . esomeprazole (NEXIUM) 40 MG  capsule Take 1 capsule (40 mg total) by mouth daily at 12 noon. 90 capsule 1  . gabapentin (NEURONTIN) 300 MG capsule Take 1 capsule (300 mg total) by mouth 4 (four) times daily. 360 capsule 1  . metoprolol tartrate (LOPRESSOR) 25 MG tablet TAKE 1 TABLET TWICE DAILY 180 tablet 0  . mupirocin cream (BACTROBAN) 2 % Apply 1 application topically 2 (two) times daily. 30 g 0  . oxybutynin (DITROPAN-XL) 10 MG 24 hr tablet TAKE 2 TABLETS EVERY DAY 180 tablet 0  . ranitidine (ZANTAC) 300 MG tablet TAKE 1 TABLET AT BEDTIME 90 tablet 0  . traZODone (DESYREL) 100 MG tablet TAKE 1/2 TO 1 TABLET AT BEDTIME 90 tablet 2  . vitamin C (ASCORBIC ACID) 500 MG tablet Take 500 mg by mouth daily.    Marland Kitchen albuterol (VENTOLIN HFA) 108 (90 BASE) MCG/ACT inhaler Inhale 2 puffs into the lungs every 6 (six) hours as needed. for wheezing 54 g 3  . amLODipine (NORVASC) 10 MG tablet Take 1 tablet (10 mg total) by mouth daily. 90 tablet 3  . ondansetron (ZOFRAN) 4 MG tablet TAKE 1 TABLET EVERY 12 HOURS AS NEEDED FOR NAUSEA 40 tablet 2  . potassium chloride (K-DUR) 10 MEQ tablet Take 1 tablet (10 mEq total) by mouth daily as needed (take with lasix). 90 tablet 1  . furosemide (LASIX) 20 MG tablet Take 1 tablet (20 mg total) by mouth daily as needed  for edema. 90 tablet 1   No facility-administered medications prior to visit.      Per HPI unless specifically indicated in ROS section below Review of Systems     Objective:    BP (!) 158/88 (BP Location: Left Arm, Patient Position: Sitting, Cuff Size: Normal)   Pulse 95   Temp 98.9 F (37.2 C) (Oral)   Ht 5\' 5"  (1.651 m)   Wt 177 lb 8 oz (80.5 kg)   SpO2 95%   BMI 29.54 kg/m   Wt Readings from Last 3 Encounters:  05/23/18 177 lb 8 oz (80.5 kg)  02/13/18 184 lb 8 oz (83.7 kg)  06/28/17 193 lb 4 oz (87.7 kg)    Physical Exam  Constitutional: She appears well-developed and well-nourished. No distress.  HENT:  Mouth/Throat: Oropharynx is clear and moist. No  oropharyngeal exudate.  Cardiovascular: Normal rate, regular rhythm and normal heart sounds.  No murmur heard. Pulmonary/Chest: Effort normal and breath sounds normal. No respiratory distress. She has no wheezes. She has no rales.  Abdominal: Soft. Bowel sounds are normal. She exhibits no distension and no mass. There is no tenderness. There is no rebound and no guarding. No hernia.  Musculoskeletal: She exhibits no edema.  Psychiatric: She has a normal mood and affect. Her behavior is normal. Judgment and thought content normal.  Nursing note and vitals reviewed.  Results for orders placed or performed in visit on 59/16/38  Basic metabolic panel  Result Value Ref Range   Sodium 141 135 - 145 mEq/L   Potassium 3.1 (L) 3.5 - 5.1 mEq/L   Chloride 102 96 - 112 mEq/L   CO2 29 19 - 32 mEq/L   Glucose, Bld 98 70 - 99 mg/dL   BUN 9 6 - 23 mg/dL   Creatinine, Ser 0.93 0.40 - 1.20 mg/dL   Calcium 9.9 8.4 - 10.5 mg/dL   GFR 74.33 >60.00 mL/min  CBC with Differential/Platelet  Result Value Ref Range   WBC 10.6 (H) 4.0 - 10.5 K/uL   RBC 4.46 3.87 - 5.11 Mil/uL   Hemoglobin 13.3 12.0 - 15.0 g/dL   HCT 39.7 36.0 - 46.0 %   MCV 89.1 78.0 - 100.0 fl   MCHC 33.5 30.0 - 36.0 g/dL   RDW 14.3 11.5 - 15.5 %   Platelets 267.0 150.0 - 400.0 K/uL   Neutrophils Relative % 64.2 43.0 - 77.0 %   Lymphocytes Relative 26.4 12.0 - 46.0 %   Monocytes Relative 7.8 3.0 - 12.0 %   Eosinophils Relative 1.2 0.0 - 5.0 %   Basophils Relative 0.4 0.0 - 3.0 %   Neutro Abs 6.8 1.4 - 7.7 K/uL   Lymphs Abs 2.8 0.7 - 4.0 K/uL   Monocytes Absolute 0.8 0.1 - 1.0 K/uL   Eosinophils Absolute 0.1 0.0 - 0.7 K/uL   Basophils Absolute 0.0 0.0 - 0.1 K/uL  DG Knee Complete 4 Views Right CLINICAL DATA:  Right knee pain after fall 6 weeks ago.  EXAM: RIGHT KNEE - COMPLETE 4+ VIEW  COMPARISON:  Extremity ultrasound dated 12/23/2013  FINDINGS: No evidence of fracture, or dislocation. Small suprapatellar joint effusion.  Severe 3 compartment osteoarthritic changes of the knee. Soft tissue calcifications posteriorly to the knee joint likely represent calcified displaced intra-articular bodies into a Baker's cyst, in correlation to extremity ultrasound.  IMPRESSION: No acute fracture or dislocation identified about the right knee.  Severe 3 compartment osteoarthritis.  Calcified loose bodies within Baker's cyst.  Electronically Signed  By: Fidela Salisbury M.D.   On: 02/13/2018 15:44      Assessment & Plan:   Problem List Items Addressed This Visit    Weight loss - Primary    Marked over the past year - associated with malaise, nausea, and endorsed dysphagia. Ongoing abd discomfort. Will need further evaluation - will check abd/pelvic CT with contrast and refer to GI to discuss possible rpt EGD (last 2013). See below.       Relevant Orders   Basic metabolic panel (Completed)   CBC with Differential/Platelet (Completed)   Ambulatory referral to Gastroenterology   Vitamin D deficiency    She has restarted regular vit D 5000u daily supplement      Uterine mass    Presumed fibroid by imaging 2017, associated with thickened endometrial stripe.       Tricompartment osteoarthritis of right knee    Acutely worse after fall suffered ~12/2017. Knee xray showed severe arthritis, calcified loose bodies in large baker's cyst.  She has new knee hinge brace - has not been wearing correctly. Placed today, showing how to use.  Next step will be ortho surgical eval.       Insomnia    Prior on trazodone - will start mirtazapine in its place - hopeful for improvement in appetite with this medicine.       Increased endometrial stripe thickness    Known uterine fibroids and thickened endometrial stripe. Had previously seen GYN Dr Leonides Schanz, but resistant to biopsy. Unsure she ever proceeded with this. Update CT abd/pelvis today.       HTN (hypertension)    BP elevated after she stopped amlodipine due to  pedal edema - will restart at lower 5mg  dose.  Continue clonidine and metoprolol bid.  Will refill lasix and potassium PRN edema.       Relevant Medications   furosemide (LASIX) 20 MG tablet   amLODipine (NORVASC) 5 MG tablet   GERD (gastroesophageal reflux disease)    Continues nexium daily.       Relevant Medications   ondansetron (ZOFRAN) 4 MG tablet   Generalized abdominal pain   Dysphagia    Endorses progressive dysphagia despite daily nexium Will refer back to GI      Relevant Orders   Ambulatory referral to Gastroenterology    Other Visit Diagnoses    Need for influenza vaccination       Relevant Orders   Flu Vaccine QUAD 36+ mos IM   Abnormal weight loss       Relevant Orders   CT Abdomen Pelvis W Contrast       Meds ordered this encounter  Medications  . ondansetron (ZOFRAN) 4 MG tablet    Sig: TAKE 1 TABLET EVERY 12 HOURS AS NEEDED FOR NAUSEA    Dispense:  40 tablet    Refill:  1  . albuterol (VENTOLIN HFA) 108 (90 Base) MCG/ACT inhaler    Sig: Inhale 2 puffs into the lungs every 6 (six) hours as needed. for wheezing    Dispense:  54 g    Refill:  3  . potassium chloride (K-DUR) 10 MEQ tablet    Sig: Take 1 tablet (10 mEq total) by mouth daily as needed (take with lasix).    Dispense:  90 tablet    Refill:  1  . furosemide (LASIX) 20 MG tablet    Sig: Take 1 tablet (20 mg total) by mouth daily as needed for edema.    Dispense:  90 tablet  Refill:  1  . amLODipine (NORVASC) 5 MG tablet    Sig: Take 1 tablet (5 mg total) by mouth daily.    Dispense:  90 tablet    Refill:  1  . mirtazapine (REMERON) 15 MG tablet    Sig: Take 1 tablet (15 mg total) by mouth at bedtime.    Dispense:  30 tablet    Refill:  6   Orders Placed This Encounter  Procedures  . CT Abdomen Pelvis W Contrast    Standing Status:   Future    Standing Expiration Date:   08/23/2019    Order Specific Question:   ** REASON FOR EXAM (FREE TEXT)    Answer:   abd pain, weight  loss, uterine mass    Order Specific Question:   If indicated for the ordered procedure, I authorize the administration of contrast media per Radiology protocol    Answer:   Yes    Order Specific Question:   Preferred imaging location?    Answer:   [facility]    Order Specific Question:   Is Oral Contrast requested for this exam?    Answer:   Yes, Per Radiology protocol    Order Specific Question:   Radiology Contrast Protocol - do NOT remove file path    Answer:   [email or web address]  . Flu Vaccine QUAD 36+ mos IM  . Basic metabolic panel  . CBC with Differential/Platelet  . Ambulatory referral to Gastroenterology    Referral Priority:   Routine    Referral Type:   Consultation    Referral Reason:   Specialty Services Required    Number of Visits Requested:   1    Follow up plan: Return if symptoms worsen or fail to improve.  Ria Bush, MD

## 2018-05-23 NOTE — Assessment & Plan Note (Signed)
She has restarted regular vit D 5000u daily supplement

## 2018-05-23 NOTE — Assessment & Plan Note (Addendum)
Marked over the past year - associated with malaise, nausea, and endorsed dysphagia. Ongoing abd discomfort. Will need further evaluation - will check abd/pelvic CT with contrast and refer to GI to discuss possible rpt EGD (last 2013). See below.

## 2018-06-05 ENCOUNTER — Telehealth: Payer: Self-pay | Admitting: Family Medicine

## 2018-06-05 DIAGNOSIS — R9389 Abnormal findings on diagnostic imaging of other specified body structures: Secondary | ICD-10-CM

## 2018-06-05 DIAGNOSIS — R634 Abnormal weight loss: Secondary | ICD-10-CM

## 2018-06-05 DIAGNOSIS — R1084 Generalized abdominal pain: Secondary | ICD-10-CM

## 2018-06-05 NOTE — Telephone Encounter (Signed)
Let's change to CT abd/pelvis without contrast given IV allergy. Changed.  Will cc Rosaria Ferries.

## 2018-06-05 NOTE — Telephone Encounter (Signed)
Nicole Kindred @ [facility] outpatient imaging Called she stated she spoke with pt.  Pt is allergic to iv contrast.  Her past CT were done without IV contrast.  If pt needs iv contrast she needs to be pre medicated and this cannot be done at outpatient this has to be done at hospital.  Do you want pt to have IV contrast please advise  Her appointment is 9/26 if she needs to be pre medicated  Appointment needs to be r/s.  Needs 13 hour prep.  If not IV contrast pt needs to pick up oral contrast and order needs to be changed to with out.  Please let pt know and tony @ [facility] know.   Nicole Kindred [phone]

## 2018-06-06 ENCOUNTER — Ambulatory Visit
Admission: RE | Admit: 2018-06-06 | Discharge: 2018-06-06 | Disposition: A | Discharge status: Home / Self Care | Payer: Medicare PPO | Source: Ambulatory Visit | Attending: Family Medicine | Admitting: Family Medicine

## 2018-06-06 DIAGNOSIS — N200 Calculus of kidney: Secondary | ICD-10-CM | POA: Diagnosis not present

## 2018-06-06 DIAGNOSIS — R634 Abnormal weight loss: Secondary | ICD-10-CM | POA: Diagnosis not present

## 2018-06-06 DIAGNOSIS — K449 Diaphragmatic hernia without obstruction or gangrene: Secondary | ICD-10-CM | POA: Diagnosis not present

## 2018-06-06 DIAGNOSIS — K5730 Diverticulosis of large intestine without perforation or abscess without bleeding: Secondary | ICD-10-CM | POA: Insufficient documentation

## 2018-06-06 DIAGNOSIS — R9389 Abnormal findings on diagnostic imaging of other specified body structures: Secondary | ICD-10-CM | POA: Diagnosis not present

## 2018-06-06 DIAGNOSIS — R1084 Generalized abdominal pain: Secondary | ICD-10-CM | POA: Insufficient documentation

## 2018-06-06 IMAGING — CT CT ABD-PELV W/O CM
2 of 4 series · 16 of 46 positions shown, 18 images · non-contrast
Comparison: [DATE]

Correlation: Pelvic ultrasound [DATE]

CLINICAL DATA: Abnormal unintended weight loss, generalized
abdominal pain, increased thickness of endometrial stripe

EXAM:
CT ABDOMEN AND PELVIS WITHOUT CONTRAST
TECHNIQUE: Multidetector CT imaging of the abdomen and pelvis was performed
following the standard protocol without IV contrast. Sagittal and
coronal MPR images reconstructed from axial data set. Patient drank
dilute oral contrast for exam.

[Series 2: routine abdomen pelvis without · axial · non-contrast · 0.70mm/px · z∈[-1502,-1122]mm · 13 of 84 slices shown, 15 images (1 of 2)]
[im 4/84  soft-tissue]
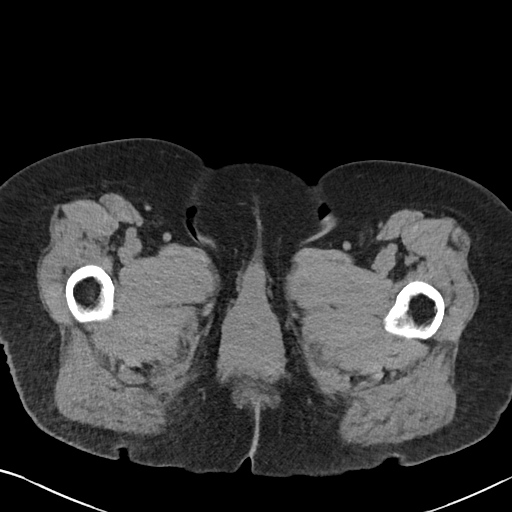
[im 4/84  bone]
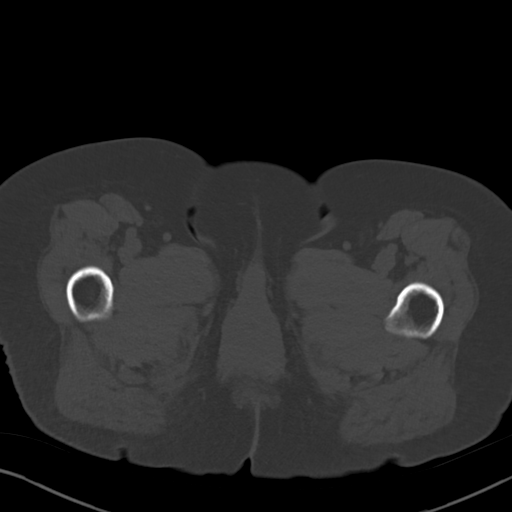
[im 10/84  soft-tissue]
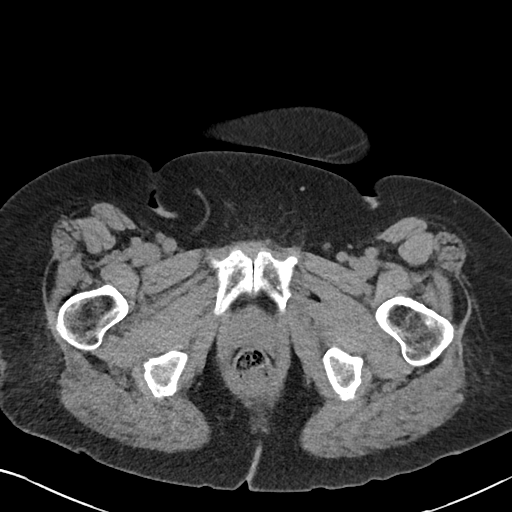
[im 17/84  soft-tissue]
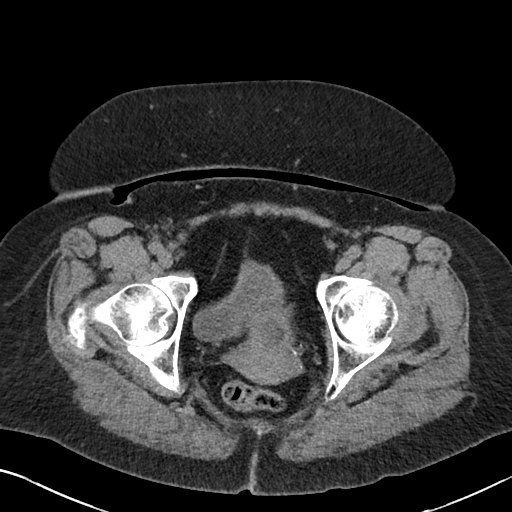
[im 24/84  soft-tissue]
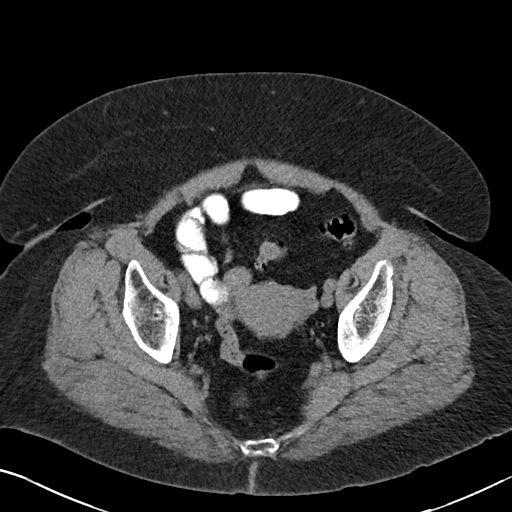
[im 30/84  soft-tissue]
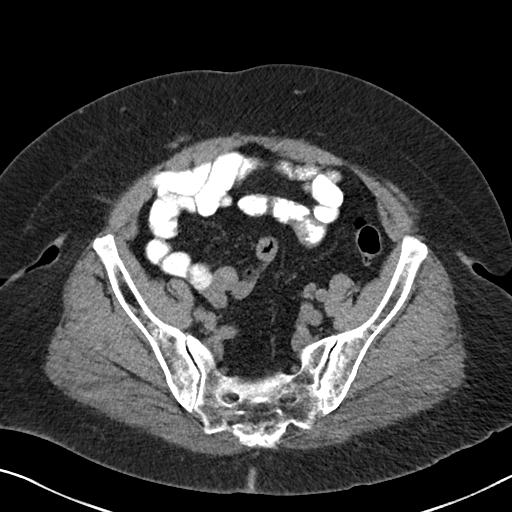
[im 37/84  soft-tissue]
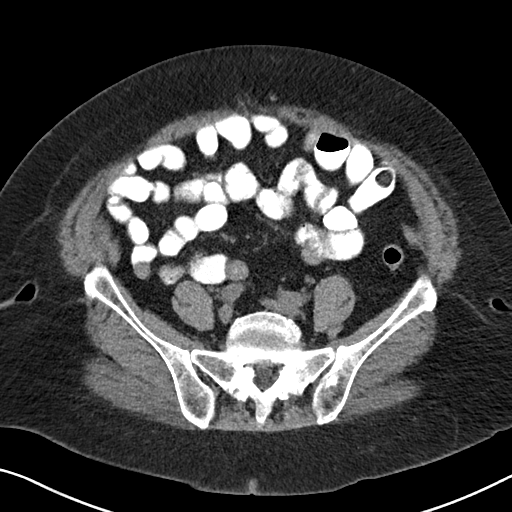
[im 44/84  soft-tissue]
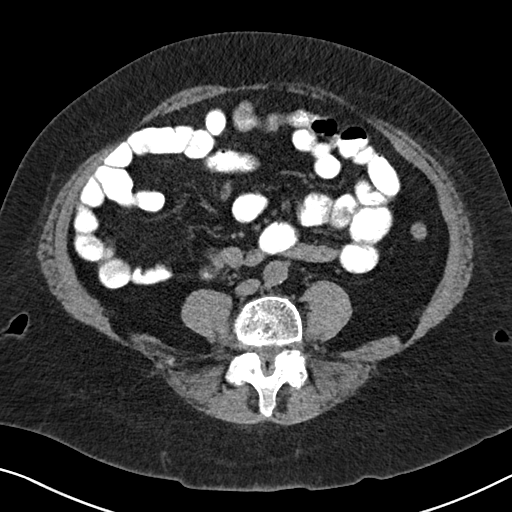
[im 47/84  soft-tissue]
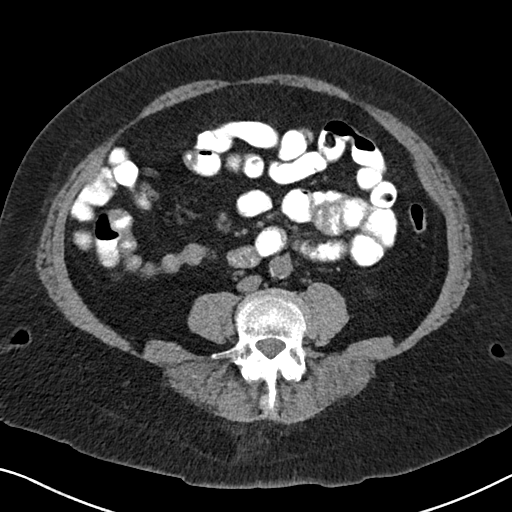
[im 54/84  soft-tissue]
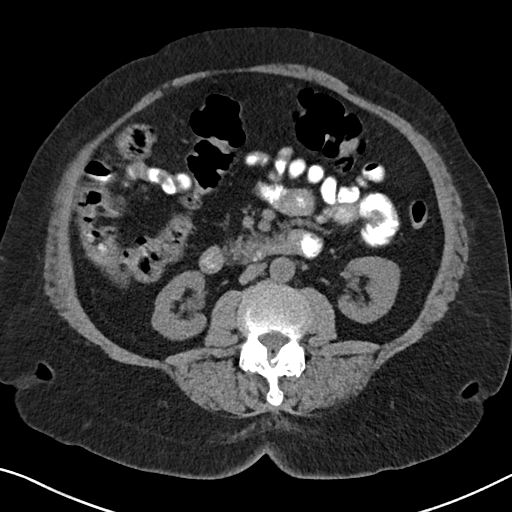
[im 54/84  bone]
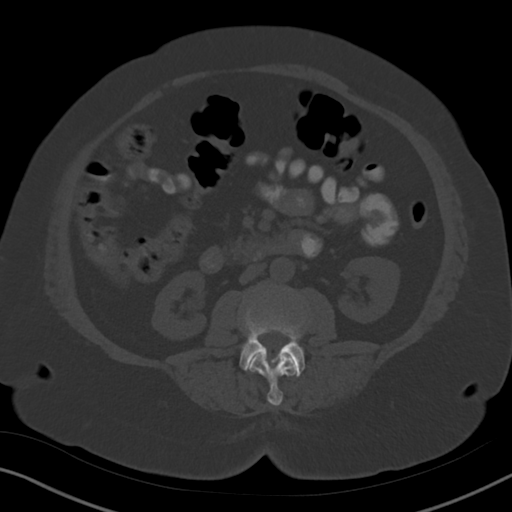
[im 60/84  soft-tissue]
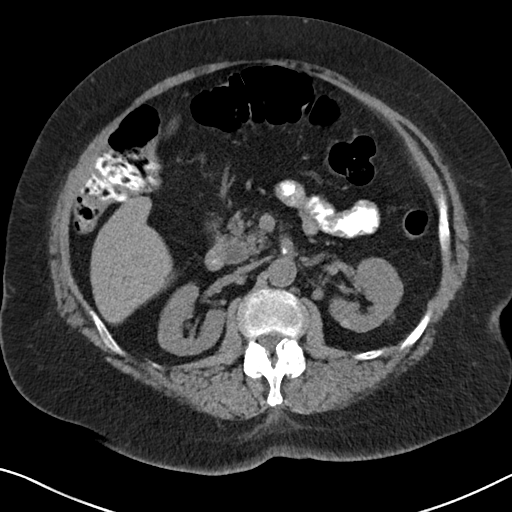
[im 67/84  soft-tissue]
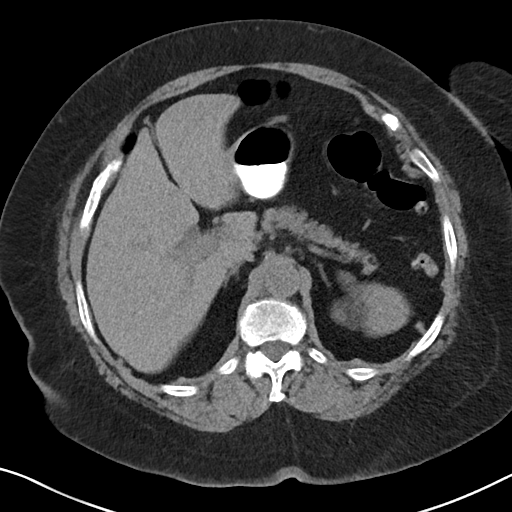
[im 74/84  soft-tissue]
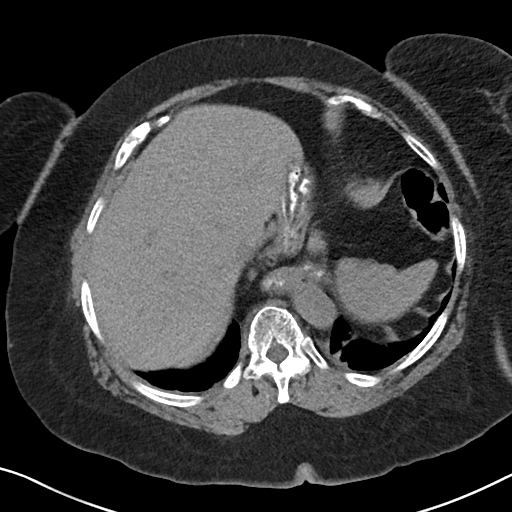
[im 80/84  soft-tissue]
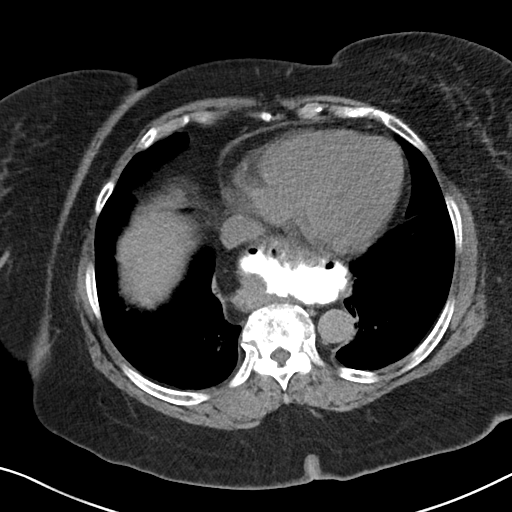

[Series 4: routine abdomen pelvis without · coronal · non-contrast · 0.70mm/px · 3 of 171 slices shown (2 of 2)]
[im 57/171  soft-tissue]
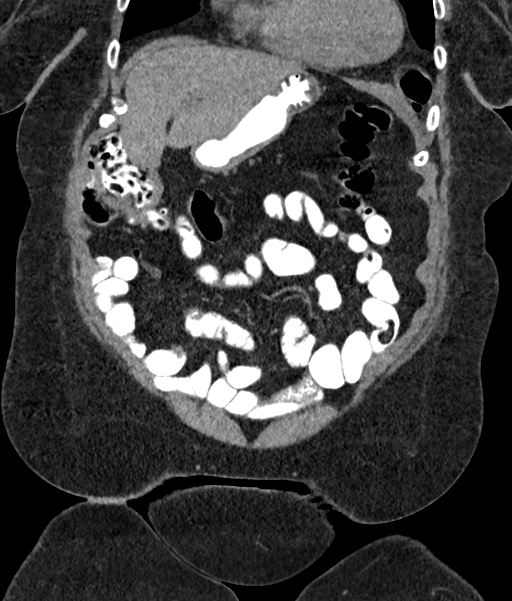
[im 76/171  soft-tissue]
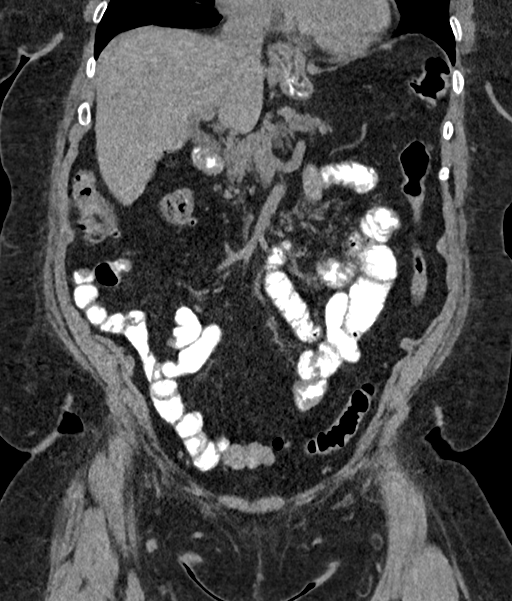
[im 95/171  soft-tissue]
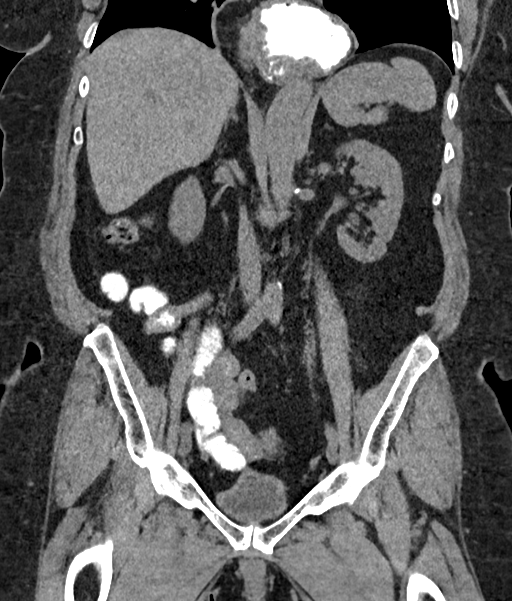

[16 of 46 positions shown; findings below may reference images not displayed]

FINDINGS: Lower chest: Mild subsegmental atelectasis LEFT lower lobe.
Remaining lung bases clear.

Hepatobiliary: Gallbladder surgically absent. 12-13 mm diameter CBD
with mild intrahepatic biliary dilatation. No focal hepatic lesions.

Pancreas: Atrophic, unremarkable

Spleen: Normal appearance

Adrenals/Urinary Tract: Adrenal glands normal appearance. 8 mm
nonobstructing calculus upper pole RIGHT kidney. Tiny exophytic
nodule mid LEFT kidney 7 mm diameter, indeterminate but stable.
Kidneys, ureters, and bladder otherwise normal appearance.

Stomach/Bowel: Appendix not visualized but no pericecal inflammatory
process seen. Mobile cecum RIGHT upper quadrant. Scattered colonic
diverticulosis. No CT evidence of diverticulitis. Moderate to large
hiatal hernia, partially collapsed, with areas of questionable wall
thickening versus artifact from underdistention. Small bowel loops
unremarkable.

Vascular/Lymphatic: Few pelvic phleboliths. Atherosclerotic
calcifications aorta without aneurysm.

Reproductive: Unremarkable adnexa. Uterine mass lesion, 2.4 x 2.0 x
2.5 cm grossly unchanged.

Other: No free air or free fluid. No hernia or acute inflammatory
process.

Musculoskeletal: Degenerative facet disease changes lower lumbar
spine. Small bone island posterior column LEFT acetabulum unchanged.
Degenerative disc disease changes L4-L5. Osseous demineralization.
Chronic mild superior endplate compression deformity of L1 stable.
IMPRESSION: Nonobstructing 8 mm RIGHT renal calculus.

Distal colonic diverticulosis without evidence of diverticulitis.

Stable uterine mass question leiomyoma 2.4 x 2.0 x 2.5 cm unchanged
since [DATE].

Moderate to large hiatal hernia.

Intrahepatic and extrahepatic biliary dilatation, recommend
correlation with LFTs.

## 2018-06-06 NOTE — Telephone Encounter (Signed)
Called scheduleing line and CT changed to a without IV contrast.

## 2018-06-09 ENCOUNTER — Encounter: Payer: Self-pay | Admitting: Family Medicine

## 2018-06-09 DIAGNOSIS — K5790 Diverticulosis of intestine, part unspecified, without perforation or abscess without bleeding: Secondary | ICD-10-CM | POA: Insufficient documentation

## 2018-06-09 DIAGNOSIS — M8580 Other specified disorders of bone density and structure, unspecified site: Secondary | ICD-10-CM | POA: Insufficient documentation

## 2018-06-09 DIAGNOSIS — K449 Diaphragmatic hernia without obstruction or gangrene: Secondary | ICD-10-CM | POA: Insufficient documentation

## 2018-06-13 ENCOUNTER — Telehealth: Payer: Self-pay

## 2018-06-13 NOTE — Telephone Encounter (Signed)
Spoke with pt returning her call. She wanted to let us know the previous female GYN she saw a couple of yrs ago is no longer with the practice. So pt does not mind seeing a female GYN if she needs to.   Also, pt asks if she stay on the trazodone. She does not want to start mirtazapine.

## 2018-06-13 NOTE — Telephone Encounter (Signed)
Copied from [place] [phone]. Topic: Inquiry >> Jun 13, 2018  8:42 AM Conception Chancy, NT wrote: Reason for CRM: patient is requesting Dr. Darnell Level nurse, Lattie Haw, to give her a call and discuss test results.

## 2018-06-14 ENCOUNTER — Other Ambulatory Visit: Payer: Self-pay | Admitting: Family Medicine

## 2018-06-27 NOTE — Telephone Encounter (Signed)
Received request for DME for back brace and knee brace. I will fill out knee brace as I evaluated her for this. Will not fill out back brace. Placed in Lisa's box.

## 2018-06-28 ENCOUNTER — Telehealth: Payer: Self-pay | Admitting: Family Medicine

## 2018-06-28 MED ORDER — TRAZODONE HCL 100 MG PO TABS: 50.0000 mg | ORAL_TABLET | Freq: Every day | ORAL | 3 refills | Status: DC

## 2018-06-28 NOTE — Telephone Encounter (Signed)
Spoke with pt relaying Dr. Synthia Innocent message. Also, informed her form was faxed.  Pt verbalizes understanding.

## 2018-06-28 NOTE — Telephone Encounter (Signed)
Pt want to go back on Trazone 100mg  and has some questions concerning. Please advise pt.

## 2018-06-28 NOTE — Telephone Encounter (Signed)
Faxed form to [employer] at [phone].

## 2018-06-28 NOTE — Telephone Encounter (Signed)
Spoke with pt relaying Dr. G's message.  Pt verbalizes understanding and expresses her thanks.  

## 2018-06-28 NOTE — Telephone Encounter (Signed)
Ok to stay on trazodone. Don't take mirtazapine.  Trazodone sent to local pharmacy.

## 2018-07-01 ENCOUNTER — Ambulatory Visit: Payer: Medicare PPO

## 2018-07-05 ENCOUNTER — Other Ambulatory Visit: Payer: Self-pay | Admitting: Family Medicine

## 2018-07-08 ENCOUNTER — Encounter: Payer: Self-pay | Admitting: Family Medicine

## 2018-07-08 NOTE — Progress Notes (Signed)
BP 122/70 (BP Location: Left Arm, Patient Position: Sitting, Cuff Size: Large)   Pulse 90   Temp 98 F (36.7 C) (Oral)   Ht 4' 11.5" (1.511 m)   Wt 187 lb 8 oz (85 kg)   SpO2 98%   BMI 37.24 kg/m    CC: CPE Subjective:    Patient ID: Brianna Woods, female    DOB: 1935/01/20, 83 y.o.   MRN: [id number]  HPI: Brianna Woods is a 83 y.o. female presenting on 07/09/2018 for Medicare Wellness   Did not see Katha Cabal this year.   Weight loss - see prior note for details. CT abd/pelvis last month was reassuring. Stable uterine leimyoma - needs GYN f/u - moderate to large hiatal hernia and biliary dilatation. We referred to GI for ongoing dysphagia - pending appt with [facility] next month. Avoids red meat. Has renewed efforts at nutritious diet - eating 4 servings of fruits/vegetables /day. She did gain 10 lbs!  She still struggles with depression. She did go to [employer] and started reading books again. She takes trazodone 100mg  daily. Interested in further treatment. Did not like mirtazapine.   She would like meds from [facility] mail order.   Preventative: ESOPHAGOGASTRODUODENOSCOPY Date: 11/2011 LA grade A esophagitis lower 1/3, dilated, med HH, o/w WNL - path: + GERD, no barrett's - f/u 11/2016  COLONOSCOPY Date: 10/02/12 diverticulosis, o/w WNL ([place])  Mammogram WNL 03/2015. Always normal. Declines repeat. She does breast exams at home.  Well woman - H/o calcified benign ovarian cyst. She saw Dr Leonides Schanz - may return.  DEXA - in [place] normal per patient  Flu shot yearly  Tetanus - 2008  Pneumonia shot - done 2010. prevnar 2015 Shingles shot - doesn't think would want.  Advanced directive: has not set up. Has discussed with family her wishes. Would want Jannet Mantis daughter in [place] to be HCPOA. Wants full code but no prolonged life support if terminal diagnosis. Packet provided today. 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 Seat belt use discussed.  Sunscreen use  discussed. No changing moles on skin.  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 Interim medical history since our last visit reviewed. Allergies and medications reviewed and updated. Outpatient Medications Prior to Visit  Medication Sig Dispense Refill  . albuterol (VENTOLIN HFA) 108 (90 Base) MCG/ACT inhaler Inhale 2 puffs into the lungs every 6 (six) hours as needed. for wheezing 54 g 3  . amLODipine (NORVASC) 5 MG tablet Take 1 tablet (5 mg total) by mouth daily. 90 tablet 1  . aspirin EC 81 MG tablet Take 1 tablet (81 mg total) by mouth daily.    Marland Kitchen atorvastatin (LIPITOR) 40 MG tablet TAKE 1 TABLET EVERY MONDAY, WEDNESDAY AND FRIDAY. 39 tablet 3  . cholecalciferol 5000 UNITS TABS Take 5,000 Units by mouth daily. 90 tablet 3  . cloNIDine (CATAPRES) 0.1 MG tablet TAKE 1 TABLET TWICE DAILY AS NEEDED (1 TABLET IN THE MORNING AND A SECOND TABLET IF BLOOD PRESSURE OVER 150/100) 180 tablet 0  . diclofenac sodium (VOLTAREN) 1 %  GEL APPLY 4 GRAMS TOPICALLY FOUR TIMES DAILY AS NEEDED. 100 g 0  . esomeprazole (NEXIUM) 40 MG capsule Take 1 capsule (40 mg total) by mouth daily at 12 noon. 90 capsule 1  . furosemide (LASIX) 20 MG tablet Take 1 tablet (20 mg total) by mouth daily as needed for edema. 90 tablet 1  . gabapentin (NEURONTIN) 300 MG capsule Take 1 capsule (300 mg total) by mouth 4 (four) times daily. 360 capsule 1  . metoprolol tartrate (LOPRESSOR) 25 MG tablet TAKE 1 TABLET TWICE DAILY 180 tablet 0  . mupirocin cream (BACTROBAN) 2 % Apply 1 application topically 2 (two) times daily. 30 g 0  .  ondansetron (ZOFRAN) 4 MG tablet TAKE 1 TABLET EVERY 12 HOURS AS NEEDED FOR NAUSEA 40 tablet 1  . oxybutynin (DITROPAN-XL) 10 MG 24 hr tablet TAKE 2 TABLETS EVERY DAY 180 tablet 0  . potassium chloride (K-DUR) 10 MEQ tablet Take 1 tablet (10 mEq total) by mouth daily as needed (take with lasix). 90 tablet 1  . ranitidine (ZANTAC) 300 MG tablet TAKE 1 TABLET AT BEDTIME 90 tablet 0  . traZODone (DESYREL) 100 MG tablet Take 0.5-1 tablets (50-100 mg total) by mouth at bedtime. 90 tablet 3  . vitamin C (ASCORBIC ACID) 500 MG tablet Take 500 mg by mouth daily.     No facility-administered medications prior to visit.      Per HPI unless specifically indicated in ROS section below Review of Systems  Constitutional: Negative for activity change, appetite change, chills, fatigue, fever and unexpected weight change.  HENT: Negative for hearing loss.   Eyes: Negative for visual disturbance.  Respiratory: Negative for cough, chest tightness, shortness of breath and wheezing.   Cardiovascular: Negative for chest pain, palpitations and leg swelling.  Gastrointestinal: Negative for abdominal distention, abdominal pain, blood in stool, constipation, diarrhea, nausea and vomiting.  Genitourinary: Negative for difficulty urinating and hematuria.  Musculoskeletal: Negative for arthralgias, myalgias and neck pain.  Skin: Negative for rash.  Neurological: Negative for dizziness, seizures, syncope and headaches.  Hematological: Negative for adenopathy. Does not bruise/bleed easily.  Psychiatric/Behavioral: Positive for dysphoric mood. The patient is nervous/anxious.        Objective:    BP 122/70 (BP Location: Left Arm, Patient Position: Sitting, Cuff Size: Large)   Pulse 90   Temp 98 F (36.7 C) (Oral)   Ht 4' 11.5" (1.511 m)   Wt 187 lb 8 oz (85 kg)   SpO2 98%   BMI 37.24 kg/m   Wt Readings from Last 3 Encounters:  07/09/18 187 lb 8 oz (85 kg)  05/23/18 177 lb 8 oz (80.5 kg)  02/13/18 184 lb 8 oz  (83.7 kg)    Physical Exam  Constitutional: She is oriented to person, place, and time. She appears well-developed and well-nourished. No distress.  HENT:  Head: Normocephalic and atraumatic.  Right Ear: Hearing, tympanic membrane, external ear and ear canal normal.  Left Ear: Hearing, tympanic membrane, external ear and ear canal normal.  Nose: Nose normal.  Mouth/Throat: Uvula is midline, oropharynx is clear and moist and mucous membranes are normal. No oropharyngeal exudate, posterior oropharyngeal edema or posterior oropharyngeal erythema.  Eyes: Pupils are equal, round, and reactive to light. Conjunctivae and EOM are normal. No scleral icterus.  Neck: Normal range of motion. Neck supple. Carotid bruit is not present. No thyromegaly present.  Cardiovascular: Normal rate, regular rhythm, normal heart sounds and intact distal pulses.  No murmur heard. Pulses:  Radial pulses are 2+ on the right side, and 2+ on the left side.  Pulmonary/Chest: Effort normal and breath sounds normal. No respiratory distress. She has no wheezes. She has no rales.  Abdominal: Soft. Bowel sounds are normal. She exhibits no distension and no mass. There is no hepatosplenomegaly. There is generalized tenderness. There is no rebound, no guarding, no CVA tenderness and negative Murphy's sign.  Musculoskeletal: Normal range of motion. She exhibits no edema.  Lymphadenopathy:    She has no cervical adenopathy.  Neurological: She is alert and oriented to person, place, and time.  CN grossly intact, station and gait intact Recall 2/3, 3/3 with cue Calculation 5/5 serial 3s  Skin: Skin is warm and dry. No rash noted.  Psychiatric: She has a normal mood and affect. Her behavior is normal. Judgment and thought content normal.  Nursing note and vitals reviewed.  Results for orders placed or performed in visit on 78/46/96  Basic metabolic panel  Result Value Ref Range   Sodium 141 135 - 145 mEq/L   Potassium 3.1  (L) 3.5 - 5.1 mEq/L   Chloride 102 96 - 112 mEq/L   CO2 29 19 - 32 mEq/L   Glucose, Bld 98 70 - 99 mg/dL   BUN 9 6 - 23 mg/dL   Creatinine, Ser 0.93 0.40 - 1.20 mg/dL   Calcium 9.9 8.4 - 10.5 mg/dL   GFR 74.33 >60.00 mL/min  CBC with Differential/Platelet  Result Value Ref Range   WBC 10.6 (H) 4.0 - 10.5 K/uL   RBC 4.46 3.87 - 5.11 Mil/uL   Hemoglobin 13.3 12.0 - 15.0 g/dL   HCT 39.7 36.0 - 46.0 %   MCV 89.1 78.0 - 100.0 fl   MCHC 33.5 30.0 - 36.0 g/dL   RDW 14.3 11.5 - 15.5 %   Platelets 267.0 150.0 - 400.0 K/uL   Neutrophils Relative % 64.2 43.0 - 77.0 %   Lymphocytes Relative 26.4 12.0 - 46.0 %   Monocytes Relative 7.8 3.0 - 12.0 %   Eosinophils Relative 1.2 0.0 - 5.0 %   Basophils Relative 0.4 0.0 - 3.0 %   Neutro Abs 6.8 1.4 - 7.7 K/uL   Lymphs Abs 2.8 0.7 - 4.0 K/uL   Monocytes Absolute 0.8 0.1 - 1.0 K/uL   Eosinophils Absolute 0.1 0.0 - 0.7 K/uL   Basophils Absolute 0.0 0.0 - 0.1 K/uL   Lab Results  Component Value Date   CHOL 204 (H) 06/28/2017   HDL 45.30 06/28/2017   LDLCALC 138 (H) 06/28/2017   LDLDIRECT 183.3 04/01/2013   TRIG 106.0 06/28/2017   CHOLHDL 5 06/28/2017    Lab Results  Component Value Date   TSH 1.99 02/13/2018   T3TOTAL 117.1 03/22/2015    Depression screen PHQ 2/9 07/09/2018 06/28/2017 04/21/2016 03/22/2015 03/20/2014  Decreased Interest 3 0 0 0 0  Down, Depressed, Hopeless 3 0 0 0 0  PHQ - 2 Score 6 0 0 0 0  Altered sleeping 2 0 - - -  Tired, decreased energy 2 0 - - -  Change in appetite 2 0 - - -  Feeling bad or failure about yourself  1 0 - - -  Trouble concentrating 0 0 - - -  Moving slowly or fidgety/restless 1 0 - - -  Suicidal thoughts 1 0 - - -  PHQ-9 Score 15 0 - - -  Difficult doing work/chores - Not difficult at all - - -    GAD 7 : Generalized  Anxiety Score 07/09/2018  Nervous, Anxious, on Edge 1  Control/stop worrying 1  Worry too much - different things 1  Trouble relaxing 3  Restless 0  Easily annoyed or irritable  1  Afraid - awful might happen 3  Total GAD 7 Score 10      Assessment & Plan:   Problem List Items Addressed This Visit    Weight loss    Actually 10 lb weight gain noted with more careful dietary choices. This is reassuring.       Vitamin D deficiency   Relevant Orders   VITAMIN D 25 Hydroxy (Vit-D Deficiency, Fractures)   Uterine mass    Stable uterine mass by latest CT. Will need GYN f/u - will start with GI evaluation.       Tricompartment osteoarthritis of right knee    Known osteoarthritis, worse after fall 12/2017. Knee xray showed severe arthritis, calcified loose bodies and large baker's cyst. She has been using hinged knee brace. Will refer to ortho for further evaluation.       Relevant Orders   Ambulatory referral to Orthopedic Surgery   Osteopenia    Normal DEXA by patient in [place] >5 yrs ago - consider updated as recent CT raised concerns for demineralization .      Mixed incontinence    Continue ditropan XL       Medicare annual wellness visit, subsequent - Primary    I have personally reviewed the Medicare Annual Wellness questionnaire and have noted 1. The patient's medical and social history 2. Their use of alcohol, tobacco or illicit drugs 3. Their current medications and supplements 4. The patient's functional ability including ADL's, fall risks, home safety risks and hearing or visual impairment. Cognitive function has been assessed and addressed as indicated.  5. Diet and physical activity 6. Evidence for depression or mood disorders The patients weight, height, BMI have been recorded in the chart. I have made referrals, counseling and provided education to the patient based on review of the above and I have provided the pt with a written personalized care plan for preventive services. Provider list updated.. See scanned questionairre as needed for further documentation. Reviewed preventative protocols and updated unless pt declined.       MDD  (major depressive disorder), single episode, moderate ([place])    Discussed depression/anxiety - pt interested in additional medication (prior on prozac then effexor) - will start sertraline 25mg  daily in am, continue trazodone at night. F/u 52mo      Relevant Medications   sertraline (ZOLOFT) 25 MG tablet   Insomnia    Continue trazodone 100mg  daily.       HTN (hypertension)    Chronic, stable. Continue current regimen.       HLD (hyperlipidemia)    Update FLP today. She continues lipitor.       Relevant Orders   Lipid panel   Comprehensive metabolic panel   History of CVA (cerebrovascular accident)    Continue aspirin, statin.       Hiatal hernia    Large by CT - will await GI eval.       Health maintenance examination    Preventative protocols reviewed and updated unless pt declined. Discussed healthy diet and lifestyle.       Glaucoma    Longstanding history of this of eye drops for several years. Overdue for f/u. I have asked her to call and schedule optometry appt.       GERD (gastroesophageal reflux  disease)    Continue nexium and zantac.       Fatty pancreas   Relevant Orders   Lipase   Dysphagia    Pending GI eval.       Atherosclerosis of abdominal aorta (HCC)    Continue aspirin ,statin.      Advanced care planning/counseling discussion    Advanced directive: has not set up. Has discussed with family her wishes. Would want Jannet Mantis daughter in [place] to be HCPOA. Wants full code but no prolonged life support if terminal diagnosis. packet provided today. 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          Meds ordered this encounter  Medications  . sertraline (ZOLOFT) 25 MG tablet    Sig: Take 1 tablet (25 mg total) by mouth daily.    Dispense:  30 tablet    Refill:  3   Orders Placed This Encounter  Procedures  . Lipid panel  . Comprehensive metabolic panel  . VITAMIN D 25 Hydroxy (Vit-D Deficiency, Fractures)   . Lipase  . Ambulatory referral to Orthopedic Surgery    Referral Priority:   Routine    Referral Type:   Surgical    Referral Reason:   Specialty Services Required    Requested Specialty:   Orthopedic Surgery    Number of Visits Requested:   1    Follow up plan: Return in about 3 months (around 10/09/2018) for follow up visit.  Ria Bush, MD

## 2018-07-09 ENCOUNTER — Encounter: Payer: Self-pay | Admitting: Family Medicine

## 2018-07-09 ENCOUNTER — Ambulatory Visit (INDEPENDENT_AMBULATORY_CARE_PROVIDER_SITE_OTHER): Payer: Medicare PPO | Admitting: Family Medicine

## 2018-07-09 VITALS — BP 122/70 | HR 90 | Temp 98.0°F | Ht 59.5 in | Wt 187.5 lb

## 2018-07-09 DIAGNOSIS — H409 Unspecified glaucoma: Secondary | ICD-10-CM | POA: Insufficient documentation

## 2018-07-09 DIAGNOSIS — K8689 Other specified diseases of pancreas: Secondary | ICD-10-CM

## 2018-07-09 DIAGNOSIS — I700 Atherosclerosis of aorta: Secondary | ICD-10-CM

## 2018-07-09 DIAGNOSIS — E785 Hyperlipidemia, unspecified: Secondary | ICD-10-CM | POA: Diagnosis not present

## 2018-07-09 DIAGNOSIS — N858 Other specified noninflammatory disorders of uterus: Secondary | ICD-10-CM

## 2018-07-09 DIAGNOSIS — K2100 Gastro-esophageal reflux disease with esophagitis, without bleeding: Secondary | ICD-10-CM

## 2018-07-09 DIAGNOSIS — N3946 Mixed incontinence: Secondary | ICD-10-CM

## 2018-07-09 DIAGNOSIS — E559 Vitamin D deficiency, unspecified: Secondary | ICD-10-CM

## 2018-07-09 DIAGNOSIS — Z7189 Other specified counseling: Secondary | ICD-10-CM

## 2018-07-09 DIAGNOSIS — G4700 Insomnia, unspecified: Secondary | ICD-10-CM

## 2018-07-09 DIAGNOSIS — Z0000 Encounter for general adult medical examination without abnormal findings: Secondary | ICD-10-CM | POA: Diagnosis not present

## 2018-07-09 DIAGNOSIS — M1711 Unilateral primary osteoarthritis, right knee: Secondary | ICD-10-CM

## 2018-07-09 DIAGNOSIS — R634 Abnormal weight loss: Secondary | ICD-10-CM

## 2018-07-09 DIAGNOSIS — Z8673 Personal history of transient ischemic attack (TIA), and cerebral infarction without residual deficits: Secondary | ICD-10-CM

## 2018-07-09 DIAGNOSIS — R1310 Dysphagia, unspecified: Secondary | ICD-10-CM

## 2018-07-09 DIAGNOSIS — K449 Diaphragmatic hernia without obstruction or gangrene: Secondary | ICD-10-CM

## 2018-07-09 DIAGNOSIS — M8580 Other specified disorders of bone density and structure, unspecified site: Secondary | ICD-10-CM

## 2018-07-09 DIAGNOSIS — F321 Major depressive disorder, single episode, moderate: Secondary | ICD-10-CM

## 2018-07-09 DIAGNOSIS — I10 Essential (primary) hypertension: Secondary | ICD-10-CM

## 2018-07-09 MED ORDER — SERTRALINE HCL 25 MG PO TABS: 25.0000 mg | ORAL_TABLET | Freq: Every day | ORAL | 3 refills | Status: DC

## 2018-07-09 NOTE — Assessment & Plan Note (Signed)
Continue nexium and zantac.

## 2018-07-09 NOTE — Assessment & Plan Note (Signed)
Large by CT - will await GI eval.

## 2018-07-09 NOTE — Assessment & Plan Note (Signed)
Known osteoarthritis, worse after fall 12/2017. Knee xray showed severe arthritis, calcified loose bodies and large baker's cyst. She has been using hinged knee brace. Will refer to ortho for further evaluation.

## 2018-07-09 NOTE — Assessment & Plan Note (Signed)
Update FLP today. She continues lipitor.

## 2018-07-09 NOTE — Assessment & Plan Note (Signed)
Continue aspirin, statin.  

## 2018-07-09 NOTE — Assessment & Plan Note (Signed)
Continue trazodone 100 mg daily.

## 2018-07-09 NOTE — Assessment & Plan Note (Addendum)
Actually 10 lb weight gain noted with more careful dietary choices. This is reassuring.

## 2018-07-09 NOTE — Assessment & Plan Note (Signed)
Pending GI eval.

## 2018-07-09 NOTE — Assessment & Plan Note (Signed)
Normal DEXA by patient in [place] >5 yrs ago - consider updated as recent CT raised concerns for demineralization .

## 2018-07-09 NOTE — Assessment & Plan Note (Addendum)
Advanced directive: has not set up. Has discussed with family her wishes. Would want Jannet Mantis daughter in [place] to be HCPOA. Wants full code but no prolonged life support if terminal diagnosis. packet provided today. 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

## 2018-07-09 NOTE — Assessment & Plan Note (Signed)
Preventative protocols reviewed and updated unless pt declined. Discussed healthy diet and lifestyle.  

## 2018-07-09 NOTE — Assessment & Plan Note (Signed)
Chronic, stable. Continue current regimen. 

## 2018-07-09 NOTE — Assessment & Plan Note (Signed)
Longstanding history of this of eye drops for several years. Overdue for f/u. I have asked her to call and schedule optometry appt.

## 2018-07-09 NOTE — Assessment & Plan Note (Signed)

## 2018-07-09 NOTE — Assessment & Plan Note (Signed)
Continue ditropan XL

## 2018-07-09 NOTE — Assessment & Plan Note (Signed)
Stable uterine mass by latest CT. Will need GYN f/u - will start with GI evaluation.

## 2018-07-09 NOTE — Patient Instructions (Addendum)
Labs today Try sertraline '25mg'$  in the morning for mood - in addition to trazodone.  If interested, check with pharmacy about new 2 shot shingles series (shingrix).  Advanced directive provided today.  Schedule dental and eye doctor appointment at your convenience. Good to see you today! We will await GI evaluation.  Return as needed or in 3 months for follow up visit We will refer you to orthopedist for further evaluation of right knee arthritis.    Health Maintenance, Female Adopting a healthy lifestyle and getting preventive care can go a long way to promote health and wellness. Talk with your health care provider about what schedule of regular examinations is right for you. This is a good chance for you to check in with your provider about disease prevention and staying healthy. In between checkups, there are plenty of things you can do on your own. Experts have done a lot of research about which lifestyle changes and preventive measures are most likely to keep you healthy. Ask your health care provider for more information. Weight and diet Eat a healthy diet  Be sure to include plenty of vegetables, fruits, low-fat dairy products, and lean protein.  Do not eat a lot of foods high in solid fats, added sugars, or salt.  Get regular exercise. This is one of the most important things you can do for your health. ? Most adults should exercise for at least 150 minutes each week. The exercise should increase your heart rate and make you sweat (moderate-intensity exercise). ? Most adults should also do strengthening exercises at least twice a week. This is in addition to the moderate-intensity exercise.  Maintain a healthy weight  Body mass index (BMI) is a measurement that can be used to identify possible weight problems. It estimates body fat based on height and weight. Your health care provider can help determine your BMI and help you achieve or maintain a healthy weight.  For females 66  years of age and older: ? A BMI below 18.5 is considered underweight. ? A BMI of 18.5 to 24.9 is normal. ? A BMI of 25 to 29.9 is considered overweight. ? A BMI of 30 and above is considered obese.  Watch levels of cholesterol and blood lipids  You should start having your blood tested for lipids and cholesterol at 81 years of age, then have this test every 5 years.  You may need to have your cholesterol levels checked more often if: ? Your lipid or cholesterol levels are high. ? You are older than 81 years of age. ? You are at high risk for heart disease.  Cancer screening Lung Cancer  Lung cancer screening is recommended for adults 31-28 years old who are at high risk for lung cancer because of a history of smoking.  A yearly low-dose CT scan of the lungs is recommended for people who: ? Currently smoke. ? Have quit within the past 15 years. ? Have at least a 30-pack-year history of smoking. A pack year is smoking an average of one pack of cigarettes a day for 1 year.  Yearly screening should continue until it has been 15 years since you quit.  Yearly screening should stop if you develop a health problem that would prevent you from having lung cancer treatment.  Breast Cancer  Practice breast self-awareness. This means understanding how your breasts normally appear and feel.  It also means doing regular breast self-exams. Let your health care provider know about any changes, no matter  how small.  If you are in your 20s or 30s, you should have a clinical breast exam (CBE) by a health care provider every 1-3 years as part of a regular health exam.  If you are 37 or older, have a CBE every year. Also consider having a breast X-ray (mammogram) every year.  If you have a family history of breast cancer, talk to your health care provider about genetic screening.  If you are at high risk for breast cancer, talk to your health care provider about having an MRI and a mammogram every  year.  Breast cancer gene (BRCA) assessment is recommended for women who have family members with BRCA-related cancers. BRCA-related cancers include: ? Breast. ? Ovarian. ? Tubal. ? Peritoneal cancers.  Results of the assessment will determine the need for genetic counseling and BRCA1 and BRCA2 testing.  Cervical Cancer Your health care provider may recommend that you be screened regularly for cancer of the pelvic organs (ovaries, uterus, and vagina). This screening involves a pelvic examination, including checking for microscopic changes to the surface of your cervix (Pap test). You may be encouraged to have this screening done every 3 years, beginning at age 19.  For women ages 40-65, health care providers may recommend pelvic exams and Pap testing every 3 years, or they may recommend the Pap and pelvic exam, combined with testing for human papilloma virus (HPV), every 5 years. Some types of HPV increase your risk of cervical cancer. Testing for HPV may also be done on women of any age with unclear Pap test results.  Other health care providers may not recommend any screening for nonpregnant women who are considered low risk for pelvic cancer and who do not have symptoms. Ask your health care provider if a screening pelvic exam is right for you.  If you have had past treatment for cervical cancer or a condition that could lead to cancer, you need Pap tests and screening for cancer for at least 20 years after your treatment. If Pap tests have been discontinued, your risk factors (such as having a new sexual partner) need to be reassessed to determine if screening should resume. Some women have medical problems that increase the chance of getting cervical cancer. In these cases, your health care provider may recommend more frequent screening and Pap tests.  Colorectal Cancer  This type of cancer can be detected and often prevented.  Routine colorectal cancer screening usually begins at 81  years of age and continues through 81 years of age.  Your health care provider may recommend screening at an earlier age if you have risk factors for colon cancer.  Your health care provider may also recommend using home test kits to check for hidden blood in the stool.  A small camera at the end of a tube can be used to examine your colon directly (sigmoidoscopy or colonoscopy). This is done to check for the earliest forms of colorectal cancer.  Routine screening usually begins at age 4.  Direct examination of the colon should be repeated every 5-10 years through 81 years of age. However, you may need to be screened more often if early forms of precancerous polyps or small growths are found.  Skin Cancer  Check your skin from head to toe regularly.  Tell your health care provider about any new moles or changes in moles, especially if there is a change in a mole's shape or color.  Also tell your health care provider if you have a  mole that is larger than the size of a pencil eraser.  Always use sunscreen. Apply sunscreen liberally and repeatedly throughout the day.  Protect yourself by wearing long sleeves, pants, a wide-brimmed hat, and sunglasses whenever you are outside.  Heart disease, diabetes, and high blood pressure  High blood pressure causes heart disease and increases the risk of stroke. High blood pressure is more likely to develop in: ? People who have blood pressure in the high end of the normal range (130-139/85-89 mm Hg). ? People who are overweight or obese. ? People who are African American.  If you are 48-13 years of age, have your blood pressure checked every 3-5 years. If you are 67 years of age or older, have your blood pressure checked every year. You should have your blood pressure measured twice-once when you are at a hospital or clinic, and once when you are not at a hospital or clinic. Record the average of the two measurements. To check your blood pressure  when you are not at a hospital or clinic, you can use: ? An automated blood pressure machine at a pharmacy. ? A home blood pressure monitor.  If you are between 44 years and 38 years old, ask your health care provider if you should take aspirin to prevent strokes.  Have regular diabetes screenings. This involves taking a blood sample to check your fasting blood sugar level. ? If you are at a normal weight and have a low risk for diabetes, have this test once every three years after 81 years of age. ? If you are overweight and have a high risk for diabetes, consider being tested at a younger age or more often. Preventing infection Hepatitis B  If you have a higher risk for hepatitis B, you should be screened for this virus. You are considered at high risk for hepatitis B if: ? You were born in a country where hepatitis B is common. Ask your health care provider which countries are considered high risk. ? Your parents were born in a high-risk country, and you have not been immunized against hepatitis B (hepatitis B vaccine). ? You have HIV or AIDS. ? You use needles to inject street drugs. ? You live with someone who has hepatitis B. ? You have had sex with someone who has hepatitis B. ? You get hemodialysis treatment. ? You take certain medicines for conditions, including cancer, organ transplantation, and autoimmune conditions.  Hepatitis C  Blood testing is recommended for: ? Everyone born from [birth year] through 1963. ? Anyone with known risk factors for hepatitis C.  Sexually transmitted infections (STIs)  You should be screened for sexually transmitted infections (STIs) including gonorrhea and chlamydia if: ? You are sexually active and are younger than 81 years of age. ? You are older than 81 years of age and your health care provider tells you that you are at risk for this type of infection. ? Your sexual activity has changed since you were last screened and you are at an increased  risk for chlamydia or gonorrhea. Ask your health care provider if you are at risk.  If you do not have HIV, but are at risk, it may be recommended that you take a prescription medicine daily to prevent HIV infection. This is called pre-exposure prophylaxis (PrEP). You are considered at risk if: ? You are sexually active and do not regularly use condoms or know the HIV status of your partner(s). ? You take drugs by injection. ? You  are sexually active with a partner who has HIV.  Talk with your health care provider about whether you are at high risk of being infected with HIV. If you choose to begin PrEP, you should first be tested for HIV. You should then be tested every 3 months for as long as you are taking PrEP. Pregnancy  If you are premenopausal and you may become pregnant, ask your health care provider about preconception counseling.  If you may become pregnant, take 400 to 800 micrograms (mcg) of folic acid every day.  If you want to prevent pregnancy, talk to your health care provider about birth control (contraception). Osteoporosis and menopause  Osteoporosis is a disease in which the bones lose minerals and strength with aging. This can result in serious bone fractures. Your risk for osteoporosis can be identified using a bone density scan.  If you are 19 years of age or older, or if you are at risk for osteoporosis and fractures, ask your health care provider if you should be screened.  Ask your health care provider whether you should take a calcium or vitamin D supplement to lower your risk for osteoporosis.  Menopause may have certain physical symptoms and risks.  Hormone replacement therapy may reduce some of these symptoms and risks. Talk to your health care provider about whether hormone replacement therapy is right for you. Follow these instructions at home:  Schedule regular health, dental, and eye exams.  Stay current with your immunizations.  Do not use any  tobacco products including cigarettes, chewing tobacco, or electronic cigarettes.  If you are pregnant, do not drink alcohol.  If you are breastfeeding, limit how much and how often you drink alcohol.  Limit alcohol intake to no more than 1 drink per day for nonpregnant women. One drink equals 12 ounces of beer, 5 ounces of wine, or 1 ounces of hard liquor.  Do not use street drugs.  Do not share needles.  Ask your health care provider for help if you need support or information about quitting drugs.  Tell your health care provider if you often feel depressed.  Tell your health care provider if you have ever been abused or do not feel safe at home. This information is not intended to replace advice given to you by your health care provider. Make sure you discuss any questions you have with your health care provider. Document Released: 03/13/2011 Document Revised: 02/03/2016 Document Reviewed: 06/01/2015 Elsevier Interactive Patient Education  [employer].

## 2018-07-09 NOTE — Assessment & Plan Note (Addendum)
Discussed depression/anxiety - pt interested in additional medication (prior on prozac then effexor) - will start sertraline 25mg  daily in am, continue trazodone at night. F/u 57mo

## 2018-07-10 LAB — VITAMIN D 25 HYDROXY (VIT D DEFICIENCY, FRACTURES): VITD: 32.56 ng/mL (ref 30.00–100.00)

## 2018-07-10 LAB — COMPREHENSIVE METABOLIC PANEL
ALT: 8 U/L (ref 0–35)
AST: 16 U/L (ref 0–37)
Albumin: 4.3 g/dL (ref 3.5–5.2)
Alkaline Phosphatase: 87 U/L (ref 39–117)
BILIRUBIN TOTAL: 0.5 mg/dL (ref 0.2–1.2)
BUN: 8 mg/dL (ref 6–23)
CALCIUM: 10.2 mg/dL (ref 8.4–10.5)
CHLORIDE: 103 meq/L (ref 96–112)
CO2: 28 meq/L (ref 19–32)
Creatinine, Ser: 1.01 mg/dL (ref 0.40–1.20)
GFR: 67.55 mL/min (ref >60.00)
Glucose, Bld: 101 mg/dL — ABNORMAL HIGH (ref 70–99)
POTASSIUM: 3.7 meq/L (ref 3.5–5.1)
Sodium: 141 mEq/L (ref 135–145)
Total Protein: 7.7 g/dL (ref 6.0–8.3)

## 2018-07-10 LAB — LIPID PANEL
CHOL/HDL RATIO: 6
Cholesterol: 302 mg/dL — ABNORMAL HIGH (ref 0–200)
HDL: 53.2 mg/dL (ref >39.00)
LDL CALC: 233 mg/dL — AB (ref 0–99)
NonHDL: 248.95
TRIGLYCERIDES: 81 mg/dL (ref 0.0–149.0)
VLDL: 16.2 mg/dL (ref 0.0–40.0)

## 2018-07-10 LAB — LIPASE: Lipase: 9 U/L — ABNORMAL LOW (ref 11.0–59.0)

## 2018-07-16 ENCOUNTER — Other Ambulatory Visit: Payer: Self-pay | Admitting: Family Medicine

## 2018-07-16 MED ORDER — ATORVASTATIN CALCIUM 40 MG PO TABS: ORAL_TABLET | ORAL | 3 refills | Status: DC

## 2018-07-17 ENCOUNTER — Other Ambulatory Visit: Payer: Self-pay | Admitting: Family Medicine

## 2018-07-17 NOTE — Telephone Encounter (Signed)
Gabapentin Last filled:  05/17/18, #360/1 Last OV:  07/09/18, CPE Next OV:  10/09/18, f/u

## 2018-07-19 ENCOUNTER — Other Ambulatory Visit: Payer: Self-pay | Admitting: Gastroenterology

## 2018-07-19 DIAGNOSIS — R1310 Dysphagia, unspecified: Secondary | ICD-10-CM

## 2018-07-19 DIAGNOSIS — K219 Gastro-esophageal reflux disease without esophagitis: Secondary | ICD-10-CM | POA: Diagnosis not present

## 2018-07-19 DIAGNOSIS — R634 Abnormal weight loss: Secondary | ICD-10-CM

## 2018-07-19 DIAGNOSIS — Z8719 Personal history of other diseases of the digestive system: Secondary | ICD-10-CM | POA: Diagnosis not present

## 2018-07-19 DIAGNOSIS — R1319 Other dysphagia: Secondary | ICD-10-CM

## 2018-07-22 ENCOUNTER — Other Ambulatory Visit: Payer: Self-pay | Admitting: Family Medicine

## 2018-07-23 ENCOUNTER — Ambulatory Visit: Payer: Medicare PPO

## 2018-07-24 ENCOUNTER — Telehealth: Payer: Self-pay

## 2018-07-24 ENCOUNTER — Other Ambulatory Visit: Payer: Self-pay

## 2018-07-24 MED ORDER — SERTRALINE HCL 25 MG PO TABS: 25.0000 mg | ORAL_TABLET | Freq: Every day | ORAL | 0 refills | Status: DC

## 2018-07-24 MED ORDER — POTASSIUM CHLORIDE ER 10 MEQ PO TBCR: 10.0000 meq | EXTENDED_RELEASE_TABLET | Freq: Every day | ORAL | 1 refills | Status: DC | PRN

## 2018-07-24 NOTE — Telephone Encounter (Signed)
Pt said that [facility] mail order pharmacy faxed a request on 07/23/18 for refills of meds; pt said she has no copay with mail order pharmacy. Lattie Haw CMA has not received fax. Gave pt fax # [phone] for [employer] to fax request to. Pt voiced understanding.

## 2018-07-24 NOTE — Telephone Encounter (Signed)
Escribed

## 2018-07-26 ENCOUNTER — Other Ambulatory Visit: Payer: Self-pay

## 2018-07-26 MED ORDER — FUROSEMIDE 20 MG PO TABS: 20.0000 mg | ORAL_TABLET | Freq: Every day | ORAL | 1 refills | Status: DC | PRN

## 2018-07-26 MED ORDER — AMLODIPINE BESYLATE 5 MG PO TABS: 5.0000 mg | ORAL_TABLET | Freq: Every day | ORAL | 1 refills | Status: DC

## 2018-07-26 MED ORDER — ATORVASTATIN CALCIUM 40 MG PO TABS: ORAL_TABLET | ORAL | 1 refills | Status: DC

## 2018-07-26 NOTE — Telephone Encounter (Signed)
Escribed

## 2018-08-01 ENCOUNTER — Other Ambulatory Visit: Payer: Self-pay | Admitting: Gastroenterology

## 2018-08-01 DIAGNOSIS — R634 Abnormal weight loss: Secondary | ICD-10-CM

## 2018-08-01 DIAGNOSIS — R1310 Dysphagia, unspecified: Secondary | ICD-10-CM

## 2018-08-01 DIAGNOSIS — R1319 Other dysphagia: Secondary | ICD-10-CM

## 2018-08-01 DIAGNOSIS — Z8719 Personal history of other diseases of the digestive system: Secondary | ICD-10-CM

## 2018-08-01 DIAGNOSIS — K219 Gastro-esophageal reflux disease without esophagitis: Secondary | ICD-10-CM

## 2018-08-17 ENCOUNTER — Other Ambulatory Visit: Payer: Self-pay | Admitting: Family Medicine

## 2018-08-18 ENCOUNTER — Other Ambulatory Visit: Payer: Self-pay | Admitting: Family Medicine

## 2018-09-18 ENCOUNTER — Ambulatory Visit: Admission: RE | Admit: 2018-09-18 | Payer: Medicare PPO | Source: Home / Self Care | Admitting: Internal Medicine

## 2018-09-18 ENCOUNTER — Encounter: Admission: RE | Payer: Self-pay | Source: Home / Self Care

## 2018-09-18 SURGERY — EGD (ESOPHAGOGASTRODUODENOSCOPY)
Anesthesia: General

## 2018-09-27 ENCOUNTER — Telehealth: Payer: Self-pay

## 2018-09-27 NOTE — Telephone Encounter (Signed)
Unable to leave a voicemail on patient's home phone, called patient's son (ok per DPR on file) and left message with information and asked for him or patient to call back to follow up on missed ortho appointment

## 2018-10-07 ENCOUNTER — Telehealth: Payer: Self-pay | Admitting: Family Medicine

## 2018-10-07 NOTE — Telephone Encounter (Signed)
Scheduled the patient to see [facility] and got notified patient cancelled the appointment with them. Cancelling Referral at this time.

## 2018-10-07 NOTE — Telephone Encounter (Signed)
Noted  

## 2018-10-09 ENCOUNTER — Ambulatory Visit: Payer: Medicare PPO | Admitting: Family Medicine

## 2018-11-18 ENCOUNTER — Other Ambulatory Visit: Payer: Self-pay | Admitting: Family Medicine

## 2018-11-21 ENCOUNTER — Other Ambulatory Visit: Payer: Self-pay

## 2018-11-21 ENCOUNTER — Ambulatory Visit
Admission: RE | Admit: 2018-11-21 | Discharge: 2018-11-21 | Disposition: A | Discharge status: Home / Self Care | Payer: Medicare PPO | Source: Ambulatory Visit | Attending: Family Medicine | Admitting: Family Medicine

## 2018-11-21 ENCOUNTER — Encounter: Payer: Self-pay | Admitting: Family Medicine

## 2018-11-21 ENCOUNTER — Ambulatory Visit: Payer: Medicare PPO | Admitting: Family Medicine

## 2018-11-21 VITALS — BP 130/78 | HR 90 | Temp 98.6°F | Ht 59.5 in | Wt 184.1 lb

## 2018-11-21 DIAGNOSIS — R42 Dizziness and giddiness: Secondary | ICD-10-CM

## 2018-11-21 DIAGNOSIS — W19XXXA Unspecified fall, initial encounter: Secondary | ICD-10-CM | POA: Diagnosis not present

## 2018-11-21 DIAGNOSIS — K2100 Gastro-esophageal reflux disease with esophagitis, without bleeding: Secondary | ICD-10-CM

## 2018-11-21 DIAGNOSIS — S0993XA Unspecified injury of face, initial encounter: Secondary | ICD-10-CM | POA: Diagnosis not present

## 2018-11-21 DIAGNOSIS — F321 Major depressive disorder, single episode, moderate: Secondary | ICD-10-CM

## 2018-11-21 DIAGNOSIS — R634 Abnormal weight loss: Secondary | ICD-10-CM

## 2018-11-21 DIAGNOSIS — I10 Essential (primary) hypertension: Secondary | ICD-10-CM

## 2018-11-21 IMAGING — CT CT MAXILLOFACIAL WITHOUT CONTRAST
3 series · 14 of 47 positions shown, 16 images · non-contrast
Comparison: None.

CLINICAL DATA: Fell 2 days ago striking the floor. Pain above the
right eye.

EXAM:
CT MAXILLOFACIAL WITHOUT CONTRAST
TECHNIQUE: Multidetector CT imaging of the maxillofacial structures was
performed. Multiplanar CT image reconstructions were also generated.

[Series 2: max soft face · axial · 0.26mm/px · z∈[-719,-595]mm · 8 of 72 slices shown, 10 images]
[im 5/72  brain]
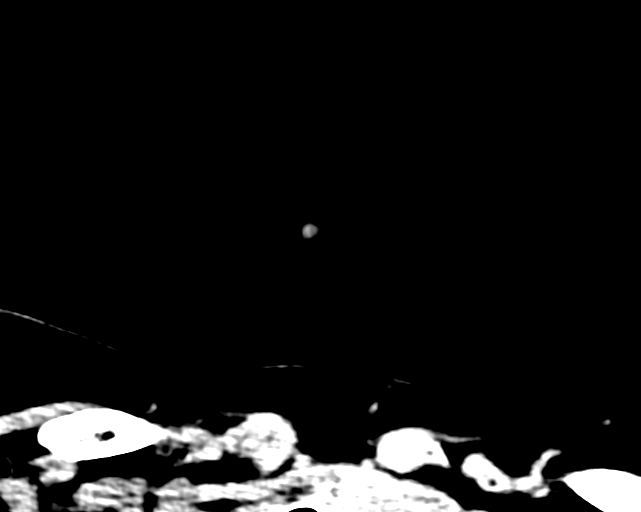
[im 5/72  bone]
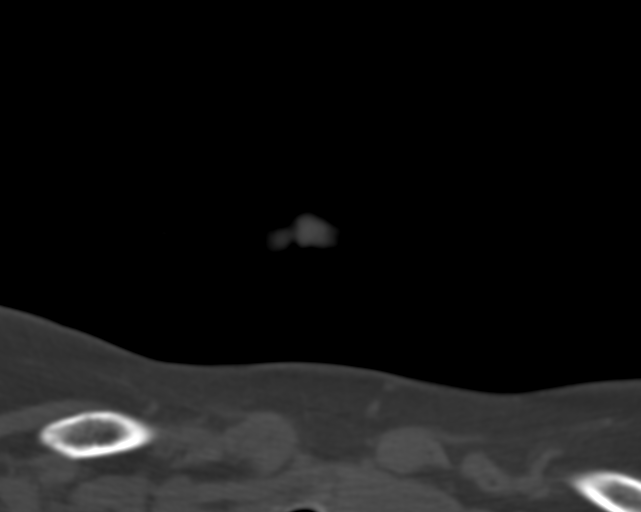
[im 15/72  bone]
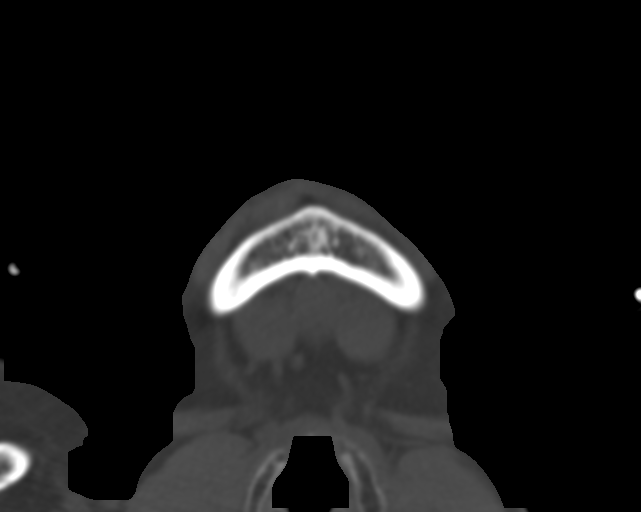
[im 23/72  bone]
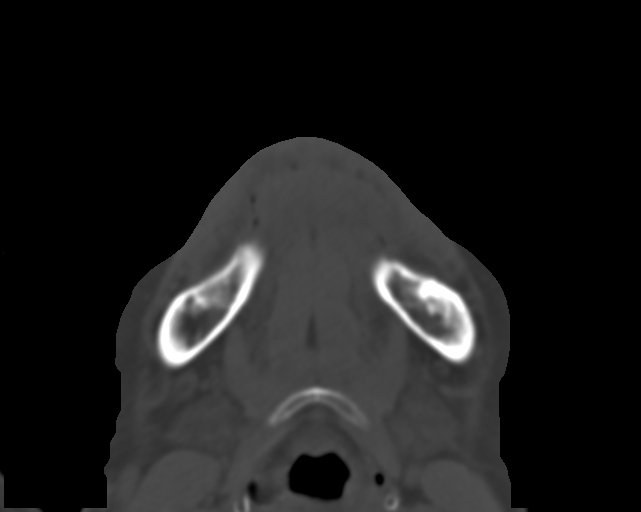
[im 32/72  bone]
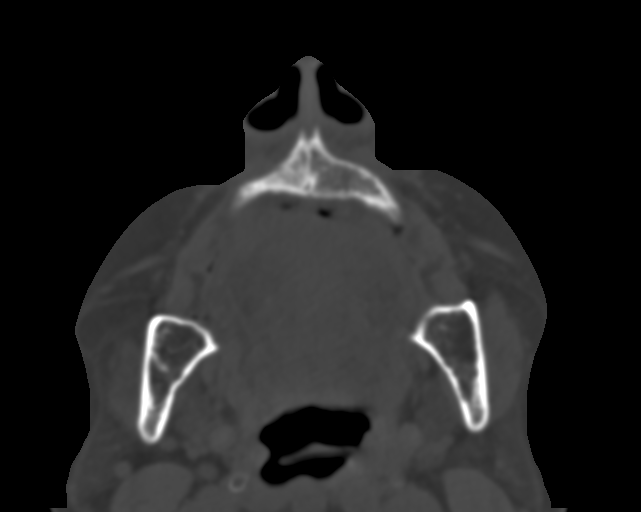
[im 40/72  brain]
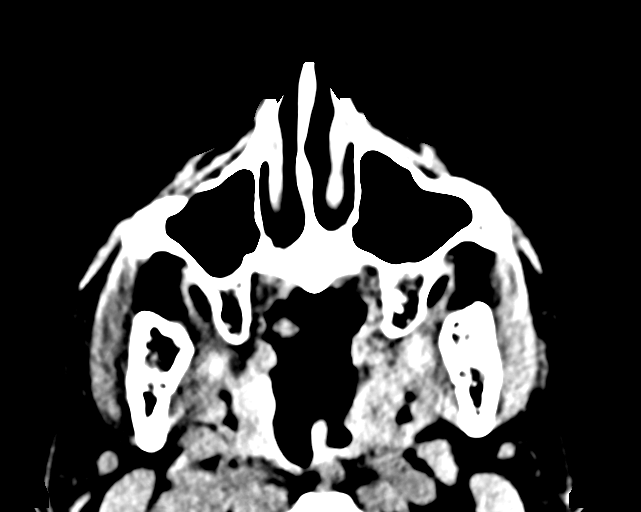
[im 40/72  bone]
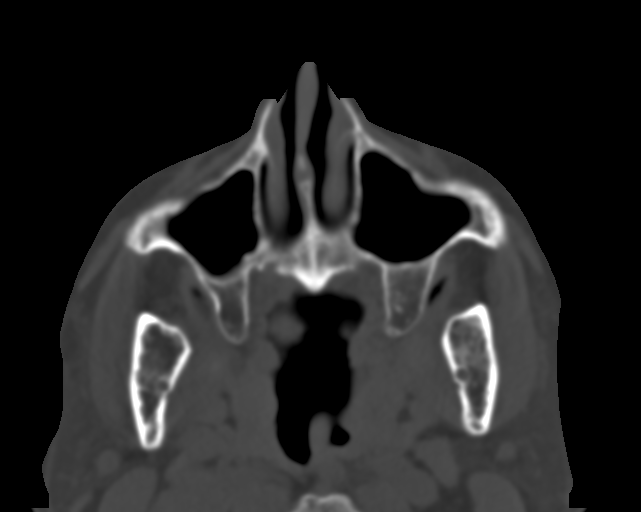
[im 49/72  bone]
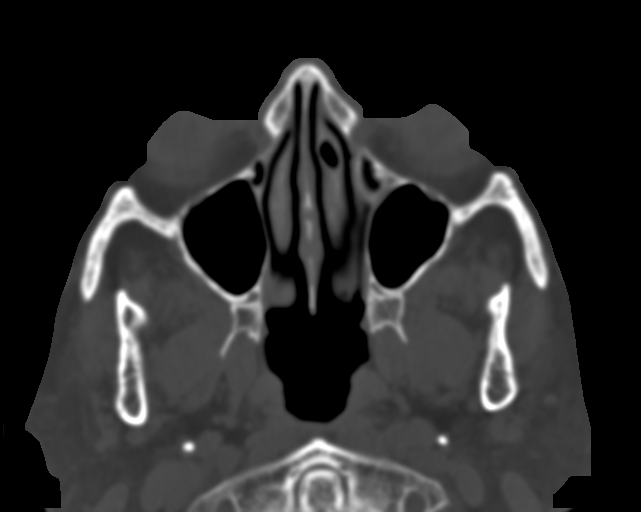
[im 57/72  bone]
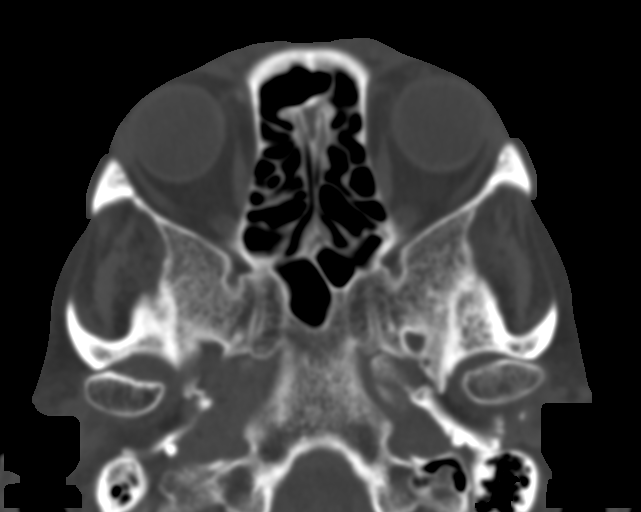
[im 67/72  bone]
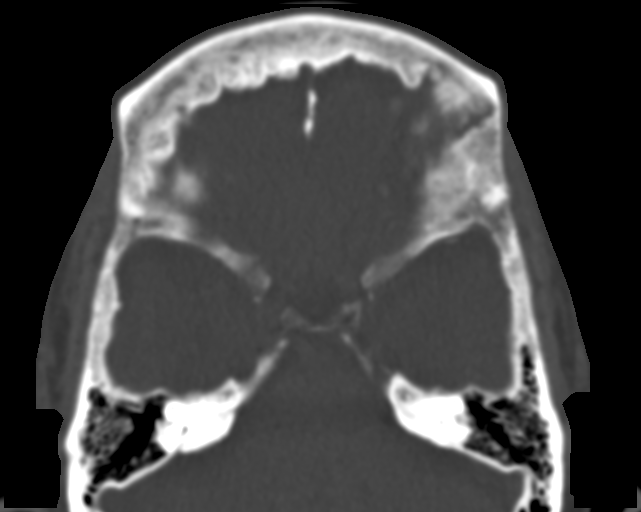

[Series 10: coronal soft face · coronal · 0.28mm/px · 3 of 65 slices shown]
[im 22/65  bone]
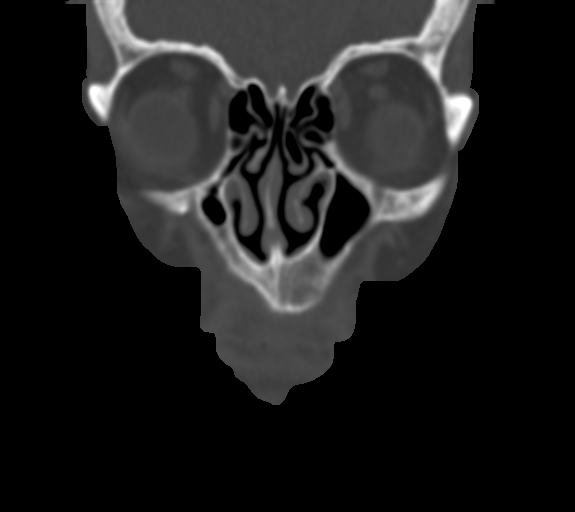
[im 29/65  bone]
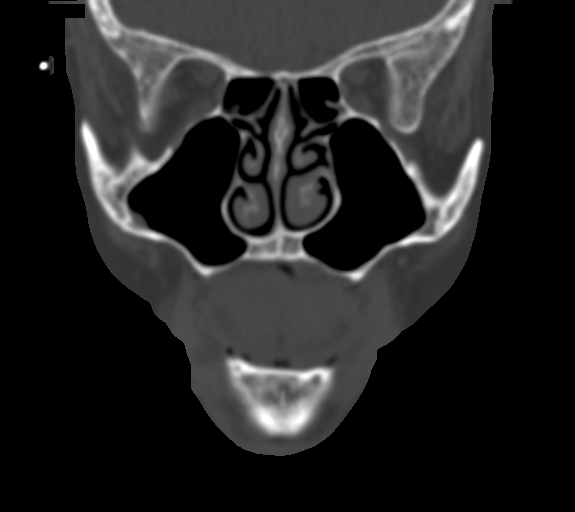
[im 36/65  bone]
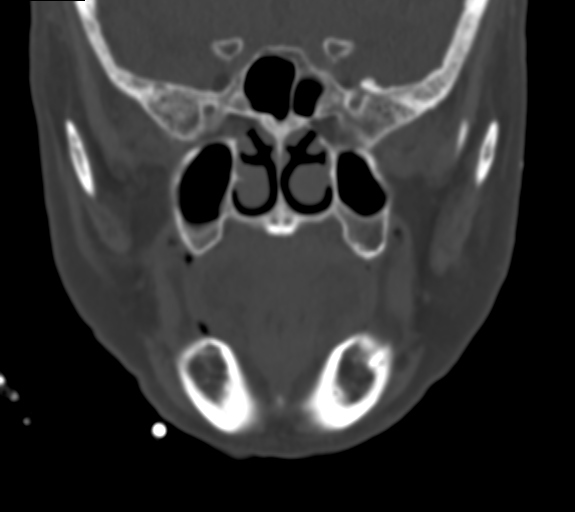

[Series 14: sagittal soft face · sagittal · 0.26mm/px · 3 of 81 slices shown]
[im 27/81  bone]
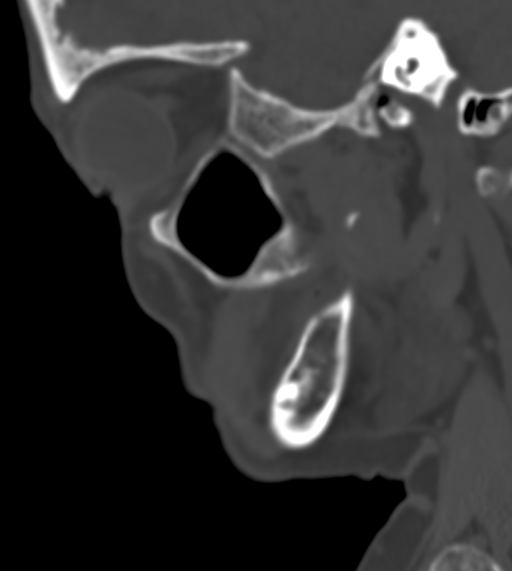
[im 41/81  bone]
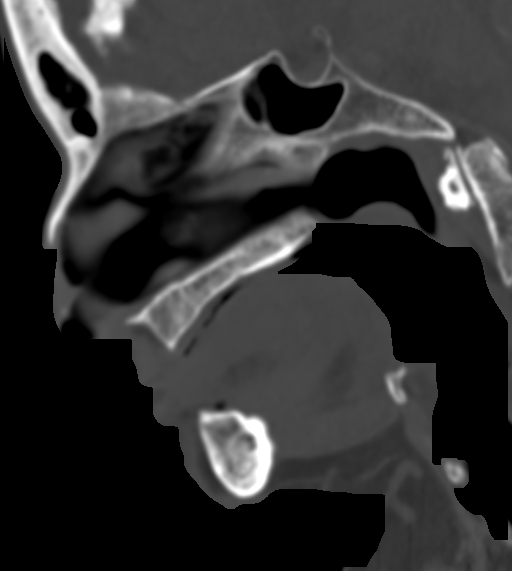
[im 54/81  bone]
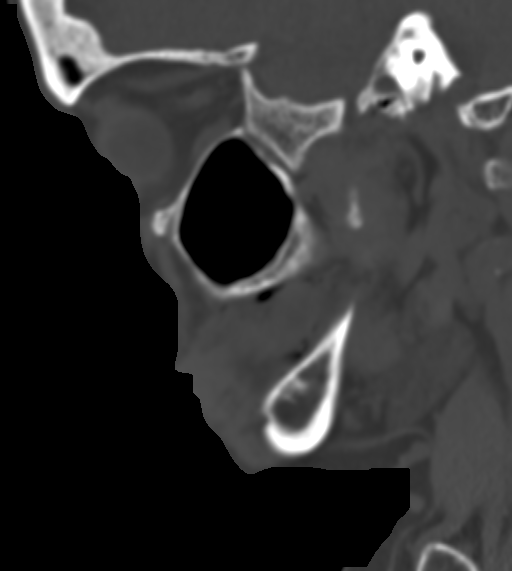

[14 of 47 positions shown; findings below may reference images not displayed]

FINDINGS: Osseous: No facial fracture. Specifically, no orbital or
supraorbital fracture on the right.

Orbits: Normal. Globes appear normal. No evidence of intraorbital
injury.

Sinuses: Clear.  Normal.

Soft tissues: Negative

Limited intracranial: Negative
IMPRESSION: No acute or traumatic finding. Specifically, no evidence of right
orbital or supraorbital injury.

## 2018-11-21 MED ORDER — FAMOTIDINE 40 MG PO TABS: 40.0000 mg | ORAL_TABLET | Freq: Every day | ORAL | 1 refills | Status: DC

## 2018-11-21 MED ORDER — SERTRALINE HCL 50 MG PO TABS: 50.0000 mg | ORAL_TABLET | Freq: Every day | ORAL | 6 refills | Status: DC

## 2018-11-21 NOTE — Assessment & Plan Note (Addendum)
Marked pain throughout temporal bone and maxilla and into forehead after fall onto concrete 2d ago. Will check maxillofacial CT given diffuse area of point tenderness in h/o bone demineralization by CT to help r/o fracture.

## 2018-11-21 NOTE — Progress Notes (Signed)
BP 130/78 (BP Location: Left Arm, Patient Position: Sitting, Cuff Size: Normal)   Pulse 90   Temp 98.6 F (37 C) (Oral)   Ht 4' 11.5" (1.511 m)   Wt 184 lb 2 oz (83.5 kg)   SpO2 95%   BMI 36.57 kg/m   Orthostatic VS for the past 24 hrs (Last 3 readings):  BP- Lying BP- Standing at 0 minutes  11/21/18 1237 - (!) 152/100  11/21/18 1234 (!) 150/104 -    CC: 3 mo f/u visit, cough Subjective:    Patient ID: Brianna Woods, female    DOB: [birth year], 84 y.o.   MRN: [id number]  HPI: Brianna Woods is a 84 y.o. female presenting on 11/21/2018 for Depression (Here for 3 mo f/u.  ); Cough (C/o cough. Says she was dx with bronchitis in 07/2018 and cough does not seem to improve. ); and Fall (States she fell at home on 11/19/18. Reports bumping the right side of her face. Says she is falling more frequently. )    See prior note for details.  Depression on trazodone 100mg  daily. Last visit we started sertraline 25mg  daily. Has not noted much improvement with this despite better screening #s.   Dx bronchitis 07/2018 - persistent cough since then. Dry cough. No significant allergy symptoms. Denies significant GERD sxs.   Noticing increasing falls - last 11/19/2018, hit her right maxillary cheek on concrete floor at home. This happened when she was getting out of bed. This is despite using walker regularly. May have been dizzy but denies shortness of breath or palpitations. No headache. No LOC. Some dizziness with transitions. She has been taking gabapentin 300mg  tid.   Hx barrett's - saw GI, pt decided against swallow study - confusion over what she had or hadn't had done recently.      Relevant past medical, surgical, family and social history reviewed and updated as indicated. Interim medical history since our last visit reviewed. Allergies and medications reviewed and updated. Outpatient Medications Prior to Visit  Medication Sig Dispense Refill  . albuterol (VENTOLIN HFA) 108 (90 Base) MCG/ACT  inhaler Inhale 2 puffs into the lungs every 6 (six) hours as needed. for wheezing 54 g 3  . amLODipine (NORVASC) 5 MG tablet Take 1 tablet (5 mg total) by mouth daily. 90 tablet 1  . aspirin EC 81 MG tablet Take 1 tablet (81 mg total) by mouth daily.    Marland Kitchen atorvastatin (LIPITOR) 40 MG tablet TAKE 1 TABLET EVERY MONDAY, WEDNESDAY AND FRIDAY. 90 tablet 1  . cholecalciferol 5000 UNITS TABS Take 5,000 Units by mouth daily. 90 tablet 3  . cloNIDine (CATAPRES) 0.1 MG tablet TAKE 1 TABLET TWICE DAILY AS NEEDED (1 TABLET IN THE MORNING AND A SECOND TABLET IF BLOOD PRESSURE OVER 150/100) 180 tablet 1  . diclofenac sodium (VOLTAREN) 1 % GEL APPLY 4 GRAMS TOPICALLY FOUR TIMES DAILY AS NEEDED. 100 g 0  . esomeprazole (NEXIUM) 40 MG capsule TAKE 1 CAPSULE EVERY DAY AT 12 NOON 90 capsule 0  . furosemide (LASIX) 20 MG tablet Take 1 tablet (20 mg total) by mouth daily as needed for edema. 90 tablet 1  . gabapentin (NEURONTIN) 300 MG capsule Take 1 capsule (300 mg total) by mouth 2 (two) times daily.    . metoprolol tartrate (LOPRESSOR) 25 MG tablet TAKE 1 TABLET TWICE DAILY 180 tablet 1  . mupirocin cream (BACTROBAN) 2 % Apply 1 application topically 2 (two) times daily. 30 g 0  . ondansetron (ZOFRAN)  4 MG tablet TAKE 1 TABLET EVERY 12 HOURS AS NEEDED FOR NAUSEA 40 tablet 1  . oxybutynin (DITROPAN-XL) 10 MG 24 hr tablet TAKE 2 TABLETS EVERY DAY 180 tablet 1  . potassium chloride (K-DUR) 10 MEQ tablet Take 1 tablet (10 mEq total) by mouth daily as needed (take with lasix). 90 tablet 1  . traZODone (DESYREL) 100 MG tablet Take 0.5-1 tablets (50-100 mg total) by mouth at bedtime. 90 tablet 3  . vitamin C (ASCORBIC ACID) 500 MG tablet Take 500 mg by mouth daily.    Marland Kitchen gabapentin (NEURONTIN) 300 MG capsule TAKE 1 CAPSULE FOUR TIMES DAILY 360 capsule 1  . mirtazapine (REMERON) 15 MG tablet TAKE 1 TABLET EVERY NIGHT 90 tablet 0  . ranitidine (ZANTAC) 300 MG tablet TAKE 1 TABLET AT BEDTIME 90 tablet 1  . sertraline  (ZOLOFT) 25 MG tablet TAKE 1 TABLET EVERY DAY 90 tablet 0   No facility-administered medications prior to visit.      Per HPI unless specifically indicated in ROS section below Review of Systems Objective:    BP 130/78 (BP Location: Left Arm, Patient Position: Sitting, Cuff Size: Normal)   Pulse 90   Temp 98.6 F (37 C) (Oral)   Ht 4' 11.5" (1.511 m)   Wt 184 lb 2 oz (83.5 kg)   SpO2 95%   BMI 36.57 kg/m   Wt Readings from Last 3 Encounters:  11/21/18 184 lb 2 oz (83.5 kg)  07/09/18 187 lb 8 oz (85 kg)  05/23/18 177 lb 8 oz (80.5 kg)    Physical Exam Vitals signs and nursing note reviewed.  Constitutional:      General: She is not in acute distress.    Appearance: Normal appearance. She is well-developed. She is not ill-appearing.  HENT:     Head: Normocephalic. No raccoon eyes, Battle's sign, contusion or laceration.      Comments: Area of tenderness as per above    Right Ear: Hearing, tympanic membrane, ear canal and external ear normal.     Left Ear: Hearing, tympanic membrane, ear canal and external ear normal.     Nose: Nose normal. No mucosal edema, congestion or rhinorrhea.     Right Sinus: No maxillary sinus tenderness or frontal sinus tenderness.     Left Sinus: No maxillary sinus tenderness or frontal sinus tenderness.     Mouth/Throat:     Mouth: Mucous membranes are moist.     Pharynx: Uvula midline. No oropharyngeal exudate or posterior oropharyngeal erythema.     Tonsils: No tonsillar abscesses.  Eyes:     General: No scleral icterus.    Extraocular Movements: Extraocular movements intact.     Conjunctiva/sclera: Conjunctivae normal.     Pupils: Pupils are equal, round, and reactive to light.  Neck:     Musculoskeletal: Normal range of motion and neck supple.  Cardiovascular:     Rate and Rhythm: Normal rate and regular rhythm.     Pulses: Normal pulses.     Heart sounds: Normal heart sounds. No murmur.  Pulmonary:     Effort: Pulmonary effort is  normal. No respiratory distress.     Breath sounds: Normal breath sounds. No wheezing, rhonchi or rales.  Musculoskeletal:     Right lower leg: No edema.     Left lower leg: No edema.  Lymphadenopathy:     Cervical: No cervical adenopathy.  Skin:    General: Skin is warm and dry.     Findings: No rash.  Neurological:     Mental Status: She is alert.  Psychiatric:        Mood and Affect: Mood normal.        Behavior: Behavior normal.        Depression screen [facility] 2/9 11/21/2018 07/09/2018 06/28/2017 04/21/2016 03/22/2015  Decreased Interest 1 3 0 0 0  Down, Depressed, Hopeless 1 3 0 0 0  PHQ - 2 Score 2 6 0 0 0  Altered sleeping 3 2 0 - -  Tired, decreased energy 3 2 0 - -  Change in appetite 2 2 0 - -  Feeling bad or failure about yourself  0 1 0 - -  Trouble concentrating 0 0 0 - -  Moving slowly or fidgety/restless 0 1 0 - -  Suicidal thoughts 0 1 0 - -  PHQ-9 Score 10 15 0 - -  Difficult doing work/chores - - Not difficult at all - -   GAD 7 : Generalized Anxiety Score 11/21/2018 07/09/2018  Nervous, Anxious, on Edge 1 1  Control/stop worrying 1 1  Worry too much - different things 1 1  Trouble relaxing 0 3  Restless 0 0  Easily annoyed or irritable 0 1  Afraid - awful might happen 0 3  Total GAD 7 Score 3 10     Assessment & Plan:   Problem List Items Addressed This Visit    Weight loss    Stable period.      MDD (major depressive disorder), single episode, moderate (HCC)    No significant improvement noted by patient despite sertraline - but improvement in PQH9. Will increase sertraline to 50mg  daily. Reassess at CPE.       Relevant Medications   sertraline (ZOLOFT) 50 MG tablet   Injury of face - Primary    Marked pain throughout temporal bone and maxilla and into forehead after fall onto concrete 2d ago. Will check maxillofacial CT given diffuse area of point tenderness in h/o bone demineralization by CT to help r/o fracture.       Relevant Orders   CT  Maxillofacial WO CM   HTN (hypertension)    Chronic, stable. Continue current regimen.       GERD (gastroesophageal reflux disease)    Overdue for f/u but she declined recommended upper GI by [facility] last year. Discussed reasons to proceed with imaging - she will consider.       Relevant Medications   famotidine (PEPCID) 40 MG tablet   Dizziness    Endorses ongoing dizziness/unsteadiness with increased falls. Orthostatics negative today. Will continue titration of meds that may contribute - start with decreasing gabapentin to 300mg  bid. Consider lower trazodone dose.        Other Visit Diagnoses    Fall with injury, initial encounter           Meds ordered this encounter  Medications  . sertraline (ZOLOFT) 50 MG tablet    Sig: Take 1 tablet (50 mg total) by mouth daily.    Dispense:  30 tablet    Refill:  6  . famotidine (PEPCID) 40 MG tablet    Sig: Take 1 tablet (40 mg total) by mouth at bedtime.    Dispense:  90 tablet    Refill:  1    In place of zantac   Orders Placed This Encounter  Procedures  . CT Maxillofacial WO CM    Standing Status:   Future    Standing Expiration Date:  02/21/2020    Order Specific Question:   ** REASON FOR EXAM (FREE TEXT)    Answer:   fall onto R cheek with residual facial pain    Order Specific Question:   Preferred imaging location?    Answer:   [facility]    Order Specific Question:   Call Results- Best Contact Number?    Answer:   [phone] or [phone]...Marland Kitchenhold pt     Order Specific Question:   Radiology Contrast Protocol - do NOT remove file path    Answer:   [email or web address]    Patient Instructions  Consider barium swallow study that was recommended by GI - you haven't had this done in several years. Your last endoscopy (scope) was 2013. Call GI if you decide to have swallow study done.  Let's increase sertraline to 50mg  daily - may take 2 until you run out, new dose will be at  pharmacy.  Pick up nasal saline irrigation for nose irritation.  See Rosaria Ferries to schedule CT of face to rule out fracture after fall.  Lungs are sounding ok today.  RTC 3-4 mo f/u visit  Follow up plan: Return in about 3 months (around 02/21/2019) for follow up visit.  Ria Bush, MD

## 2018-11-21 NOTE — Assessment & Plan Note (Signed)
Overdue for f/u but she declined recommended upper GI by [facility] last year. Discussed reasons to proceed with imaging - she will consider.

## 2018-11-21 NOTE — Patient Instructions (Addendum)
Consider barium swallow study that was recommended by GI - you haven't had this done in several years. Your last endoscopy (scope) was 2013. Call GI if you decide to have swallow study done.  Let's increase sertraline to 50mg  daily - may take 2 until you run out, new dose will be at pharmacy.  Pick up nasal saline irrigation for nose irritation.  See Brianna Woods to schedule CT of face to rule out fracture after fall.  Lungs are sounding ok today.  RTC 3-4 mo f/u visit

## 2018-11-21 NOTE — Assessment & Plan Note (Signed)
No significant improvement noted by patient despite sertraline - but improvement in PQH9. Will increase sertraline to 50mg  daily. Reassess at CPE.

## 2018-11-21 NOTE — Assessment & Plan Note (Signed)
Chronic, stable. Continue current regimen. 

## 2018-11-21 NOTE — Assessment & Plan Note (Signed)
Stable period.  

## 2018-11-21 NOTE — Assessment & Plan Note (Signed)
Endorses ongoing dizziness/unsteadiness with increased falls. Orthostatics negative today. Will continue titration of meds that may contribute - start with decreasing gabapentin to 300mg  bid. Consider lower trazodone dose.

## 2018-12-11 ENCOUNTER — Other Ambulatory Visit: Payer: Self-pay

## 2018-12-11 MED ORDER — FUROSEMIDE 20 MG PO TABS: 20.0000 mg | ORAL_TABLET | Freq: Every day | ORAL | 0 refills | Status: DC | PRN

## 2018-12-11 MED ORDER — AMLODIPINE BESYLATE 5 MG PO TABS: 5.0000 mg | ORAL_TABLET | Freq: Every day | ORAL | 0 refills | Status: DC

## 2018-12-11 MED ORDER — CLONIDINE HCL 0.1 MG PO TABS: ORAL_TABLET | ORAL | 0 refills | Status: DC

## 2018-12-11 MED ORDER — METOPROLOL TARTRATE 25 MG PO TABS: 25.0000 mg | ORAL_TABLET | Freq: Two times a day (BID) | ORAL | 0 refills | Status: DC

## 2018-12-11 MED ORDER — OXYBUTYNIN CHLORIDE ER 10 MG PO TB24: 20.0000 mg | ORAL_TABLET | Freq: Every day | ORAL | 0 refills | Status: DC

## 2018-12-11 NOTE — Telephone Encounter (Signed)
E-scribed refills.  

## 2018-12-11 NOTE — Addendum Note (Signed)
Addended by: Brenton Grills on: 09/12/5269 29:29 PM   Modules accepted: Orders

## 2018-12-13 ENCOUNTER — Other Ambulatory Visit: Payer: Self-pay | Admitting: Family Medicine

## 2018-12-13 MED ORDER — POTASSIUM CHLORIDE ER 10 MEQ PO TBCR: 10.0000 meq | EXTENDED_RELEASE_TABLET | Freq: Every day | ORAL | 0 refills | Status: DC | PRN

## 2018-12-13 NOTE — Addendum Note (Signed)
Addended by: Brenton Grills on: 02/12/3837 18:40 AM   Modules accepted: Orders

## 2019-01-08 ENCOUNTER — Other Ambulatory Visit: Payer: Self-pay | Admitting: Family Medicine

## 2019-01-09 ENCOUNTER — Other Ambulatory Visit: Payer: Self-pay

## 2019-01-09 ENCOUNTER — Other Ambulatory Visit: Payer: Self-pay | Admitting: Family Medicine

## 2019-01-09 MED ORDER — ESOMEPRAZOLE MAGNESIUM 40 MG PO CPDR: DELAYED_RELEASE_CAPSULE | ORAL | 0 refills | Status: DC

## 2019-01-09 NOTE — Telephone Encounter (Signed)
Last OV 11/21/2018  Last refilled   Next OV

## 2019-01-11 ENCOUNTER — Other Ambulatory Visit: Payer: Self-pay | Admitting: Family Medicine

## 2019-01-15 ENCOUNTER — Encounter: Payer: Self-pay | Admitting: Family Medicine

## 2019-01-15 ENCOUNTER — Ambulatory Visit (INDEPENDENT_AMBULATORY_CARE_PROVIDER_SITE_OTHER): Payer: Medicare PPO | Admitting: Family Medicine

## 2019-01-15 VITALS — BP 138/62 | HR 62 | Temp 98.6°F | Ht 59.5 in

## 2019-01-15 DIAGNOSIS — R600 Localized edema: Secondary | ICD-10-CM | POA: Diagnosis not present

## 2019-01-15 MED ORDER — GABAPENTIN 300 MG PO CAPS: 300.0000 mg | ORAL_CAPSULE | Freq: Two times a day (BID) | ORAL | 1 refills | Status: DC

## 2019-01-15 MED ORDER — OXYBUTYNIN CHLORIDE ER 10 MG PO TB24: 10.0000 mg | ORAL_TABLET | Freq: Two times a day (BID) | ORAL | 0 refills | Status: DC

## 2019-01-15 MED ORDER — SERTRALINE HCL 50 MG PO TABS: 50.0000 mg | ORAL_TABLET | Freq: Every day | ORAL | 1 refills | Status: DC

## 2019-01-15 NOTE — Assessment & Plan Note (Signed)
Of unclear cause. ?ham sandwich over weekend. rec continue lasix 20mg  BID x 2-3 more days with doubled potassium to compensate. If not improved with this, I asked her to call us Friday for in-office visit for further evaluation and possible blood work. Pt agrees with plan.

## 2019-01-15 NOTE — Progress Notes (Signed)
   Brianna Woods - 84 y.o. female  MRN [id number]  Date of Birth: 08/15/1935  PCP: Ria Bush, MD  This service was provided via telemedicine. Phone Visit performed on 01/15/2019   Rationale for phone visit along with limitations reviewed. Patient consented to telephone encounter.   Location of patient: home Location of provider: in office [place] @ [facility] Name of referring provider: N/A  Names of persons and role in encounter: Provider: Ria Bush, MD  Patient: Brianna Woods  Other: N/A  Time on call: 11:02am - 11.15am  Subjective: CC: pedal edema HPI:  Several day h/o bilateral leg swelling that was initially noted over the weekend. L>R. Normally takes lasix 20mg  daily. Progressively worsening last 2 days, tightness noted. She tried lasix 40mg  for the last 3 days with increased urinary output. No improvement in leg swelling with this. Swelling from feet to below knees. Notes mild indentation when pushing on feet and lower leg.   Denies changes in diet. No recent salt/sodium in diet or new barbeque. She did eat pressed ham sandwiches over the weekend.   No leg pain. Denies chest pain, dyspnea, cough, headache or dizziness. Denies inciting trauma/injury. No redness or warmth of legs.   Objective/Observations:   No physical exam or vital signs collected unless specifically identified below.   BP 138/62 (BP Location: Left Arm, Patient Position: Sitting, Cuff Size: Normal)   Pulse 62   Temp 98.6 F (37 C) (Oral)   Ht 4' 11.5" (1.511 m)   BMI 36.57 kg/m    Respiratory status: speaks in complete sentences without evident shortness of breath.   Lab Results  Component Value Date   CREATININE 1.01 07/09/2018   BUN 8 07/09/2018   NA 141 07/09/2018   K 3.7 07/09/2018   CL 103 07/09/2018   CO2 28 07/09/2018    Assessment/Plan:  Pedal edema Of unclear cause. ?ham sandwich over weekend. rec continue lasix 20mg  BID x 2-3 more days with doubled potassium to  compensate. If not improved with this, I asked her to call us Friday for in-office visit for further evaluation and possible blood work. Pt agrees with plan.    I discussed the assessment and treatment plan with the patient. The patient was provided an opportunity to ask questions and all were answered. The patient agreed with the plan and demonstrated an understanding of the instructions.  Lab Orders  No laboratory test(s) ordered today    Meds ordered this encounter  Medications  . sertraline (ZOLOFT) 50 MG tablet    Sig: Take 1 tablet (50 mg total) by mouth daily.    Dispense:  90 tablet    Refill:  1    This should be new dose  . gabapentin (NEURONTIN) 300 MG capsule    Sig: Take 1 capsule (300 mg total) by mouth 2 (two) times daily.    Dispense:  180 capsule    Refill:  1    Note new sig  . oxybutynin (DITROPAN-XL) 10 MG 24 hr tablet    Sig: Take 1 tablet (10 mg total) by mouth 2 (two) times a day.    Dispense:  180 tablet    Refill:  0    The patient was advised to call back or seek an in-person evaluation if the symptoms worsen or if the condition fails to improve as anticipated.  Ria Bush, MD

## 2019-01-20 ENCOUNTER — Telehealth: Payer: Self-pay

## 2019-01-20 NOTE — Telephone Encounter (Signed)
PT scheduled for 01/20/19 In office @ 3pm

## 2019-01-20 NOTE — Telephone Encounter (Signed)
[facility] Night - Client Nonclinical Telephone Record [facility] Night - Client Client Site [place] Physician Ria Bush - MD Contact Type Call Who Is Calling Patient / Member / Family / Caregiver Caller Name Kariann Wecker Caller Phone Number [phone] Patient Name Brianna Woods Patient DOB [birth year] Call Type Message Only Information Provided Reason for Call Request for General Office Information Initial Comment Caller states her feet are all swollen. She needs a appt with Dr Leo Grosser. Additional Comment

## 2019-01-21 ENCOUNTER — Encounter: Payer: Self-pay | Admitting: Family Medicine

## 2019-01-21 ENCOUNTER — Other Ambulatory Visit: Payer: Self-pay

## 2019-01-21 ENCOUNTER — Ambulatory Visit: Payer: Medicare PPO | Admitting: Family Medicine

## 2019-01-21 VITALS — BP 132/76 | HR 88 | Temp 98.7°F | Ht 59.5 in | Wt 204.6 lb

## 2019-01-21 DIAGNOSIS — R600 Localized edema: Secondary | ICD-10-CM | POA: Diagnosis not present

## 2019-01-21 DIAGNOSIS — E0520 Thyrotoxicosis with toxic multinodular goiter without thyrotoxic crisis or storm: Secondary | ICD-10-CM | POA: Diagnosis not present

## 2019-01-21 NOTE — Telephone Encounter (Signed)
Pt was seen today by Dr Darnell Level.

## 2019-01-21 NOTE — Progress Notes (Signed)
This visit was conducted in person.  BP 132/76 (BP Location: Left Arm, Patient Position: Sitting, Cuff Size: Large)    Pulse 88    Temp 98.7 F (37.1 C) (Oral)    Ht 4' 11.5" (1.511 m)    Wt 204 lb 9 oz (92.8 kg)    SpO2 96%    BMI 40.63 kg/m    CC: bilateral leg swelling/soreness Subjective:    Patient ID: Brianna Woods, female    DOB: 12-12-1934, 84 y.o.   MRN: [id number]  HPI: Brianna Woods is a 84 y.o. female presenting on 01/21/2019 for Leg Swelling (C/o bilateral leg and feet swelling. Started about 2 wks. Also, has some leg soreness. )   See prior virtual visit for details.  Seen last week for progressive leg swelling despite lasix 20mg  daily. May have started after eating pressed ham sandwiches.   Last visit we increased lasix to 20mg  BID x 3 days. Symptoms have not really improved but actually worsened. Here for further evaluation. Some leg soreness noted as well especially popliteal region bilaterally. Bilateral leg swelling.   20 lb weight gain in last 2 months.   Denies chest pain, dyspnea, abd pain, nausea/vomiting, cough or dizziness. Denies inciting trauma/injury. No redness or warmth of legs. No hand swelling or puffiness around eyes.   H/o graves disease s/p radioactive thyroid treatment 2015 ([facility]), h/o benign L thyroid nodule biopsy 2010.      Relevant past medical, surgical, family and social history reviewed and updated as indicated. Interim medical history since our last visit reviewed. Allergies and medications reviewed and updated. Outpatient Medications Prior to Visit  Medication Sig Dispense Refill   albuterol (VENTOLIN HFA) 108 (90 Base) MCG/ACT inhaler Inhale 2 puffs into the lungs every 6 (six) hours as needed. for wheezing 54 g 3   amLODipine (NORVASC) 5 MG tablet TAKE 1 TABLET EVERY DAY 90 tablet 0   aspirin EC 81 MG tablet Take 1 tablet (81 mg total) by mouth daily.     atorvastatin (LIPITOR) 40 MG tablet TAKE 1 TABLET EVERY MONDAY, WEDNESDAY AND  FRIDAY. 90 tablet 1   cholecalciferol 5000 UNITS TABS Take 5,000 Units by mouth daily. 90 tablet 3   cloNIDine (CATAPRES) 0.1 MG tablet TAKE 1 TABLET TWICE DAILY AS NEEDED (1 TABLET IN THE MORNING AND A SECOND TABLET IF BLOOD PRESSURE OVER 150/100) 180 tablet 0   esomeprazole (NEXIUM) 40 MG capsule TAKE 1 CAPSULE EVERY DAY AT 12 NOON 90 capsule 0   famotidine (PEPCID) 40 MG tablet Take 1 tablet (40 mg total) by mouth at bedtime. 90 tablet 1   furosemide (LASIX) 20 MG tablet Take 1 tablet (20 mg total) by mouth daily as needed for edema. 90 tablet 0   gabapentin (NEURONTIN) 300 MG capsule Take 1 capsule (300 mg total) by mouth 2 (two) times daily. 180 capsule 1   metoprolol tartrate (LOPRESSOR) 25 MG tablet TAKE 1 TABLET TWICE DAILY 180 tablet 0   ondansetron (ZOFRAN) 4 MG tablet TAKE 1 TABLET EVERY 12 HOURS AS NEEDED FOR NAUSEA 40 tablet 1   oxybutynin (DITROPAN-XL) 10 MG 24 hr tablet Take 1 tablet (10 mg total) by mouth 2 (two) times a day. 180 tablet 0   potassium chloride (K-DUR) 10 MEQ tablet TAKE 1 TABLET   DAILY AS NEEDED (TAKE WITH LASIX). 90 tablet 0   sertraline (ZOLOFT) 50 MG tablet Take 1 tablet (50 mg total) by mouth daily. 90 tablet 1   traZODone (DESYREL) 100  MG tablet TAKE 1/2 TO 1 TABLET AT BEDTIME 90 tablet 0   vitamin C (ASCORBIC ACID) 500 MG tablet Take 500 mg by mouth daily.     diclofenac sodium (VOLTAREN) 1 % GEL APPLY 4 GRAMS TOPICALLY FOUR TIMES DAILY AS NEEDED. 100 g 0   mupirocin cream (BACTROBAN) 2 % Apply 1 application topically 2 (two) times daily. 30 g 0   No facility-administered medications prior to visit.      Per HPI unless specifically indicated in ROS section below Review of Systems Objective:    BP 132/76 (BP Location: Left Arm, Patient Position: Sitting, Cuff Size: Large)    Pulse 88    Temp 98.7 F (37.1 C) (Oral)    Ht 4' 11.5" (1.511 m)    Wt 204 lb 9 oz (92.8 kg)    SpO2 96%    BMI 40.63 kg/m   Wt Readings from Last 3 Encounters:    01/21/19 204 lb 9 oz (92.8 kg)  11/21/18 184 lb 2 oz (83.5 kg)  07/09/18 187 lb 8 oz (85 kg)    Physical Exam Vitals signs and nursing note reviewed.  Constitutional:      Appearance: Normal appearance. She is not ill-appearing.  HENT:     Mouth/Throat:     Mouth: Mucous membranes are moist.     Pharynx: Oropharynx is clear. No posterior oropharyngeal erythema.  Neck:     Thyroid: Thyroid mass (L sided, nontender) present. No thyromegaly or thyroid tenderness.  Cardiovascular:     Rate and Rhythm: Normal rate and regular rhythm.     Pulses: Normal pulses.     Heart sounds: Normal heart sounds. No murmur.  Pulmonary:     Effort: Pulmonary effort is normal. No respiratory distress.     Breath sounds: Normal breath sounds. No wheezing, rhonchi or rales.  Abdominal:     General: Abdomen is flat. There is no distension.     Palpations: Abdomen is soft. There is no mass.     Tenderness: There is no abdominal tenderness. There is no guarding or rebound.     Hernia: No hernia is present.  Musculoskeletal:        General: Swelling and tenderness present.     Right lower leg: Edema (marked, nonpitting) present.     Left lower leg: Edema (marked, nonpitting) present.     Comments: 2+ DP bilaterally Swelling  Tightness to calves and popliteal area bilaterally, no palpable cords.   Lymphadenopathy:     Cervical: No cervical adenopathy.  Skin:    General: Skin is warm and dry.     Findings: No erythema or rash.  Neurological:     Mental Status: She is alert.  Psychiatric:        Mood and Affect: Mood normal.       Results for orders placed or performed in visit on 07/09/18  Lipid panel  Result Value Ref Range   Cholesterol 302 (H) 0 - 200 mg/dL   Triglycerides 81.0 0.0 - 149.0 mg/dL   HDL 53.20 >39.00 mg/dL   VLDL 16.2 0.0 - 40.0 mg/dL   LDL Cholesterol 233 (H) 0 - 99 mg/dL   Total CHOL/HDL Ratio 6    NonHDL 248.95   Comprehensive metabolic panel  Result Value Ref Range    Sodium 141 135 - 145 mEq/L   Potassium 3.7 3.5 - 5.1 mEq/L   Chloride 103 96 - 112 mEq/L   CO2 28 19 - 32 mEq/L  Glucose, Bld 101 (H) 70 - 99 mg/dL   BUN 8 6 - 23 mg/dL   Creatinine, Ser 1.01 0.40 - 1.20 mg/dL   Total Bilirubin 0.5 0.2 - 1.2 mg/dL   Alkaline Phosphatase 87 39 - 117 U/L   AST 16 0 - 37 U/L   ALT 8 0 - 35 U/L   Total Protein 7.7 6.0 - 8.3 g/dL   Albumin 4.3 3.5 - 5.2 g/dL   Calcium 10.2 8.4 - 10.5 mg/dL   GFR 67.55 >60.00 mL/min  VITAMIN D 25 Hydroxy (Vit-D Deficiency, Fractures)  Result Value Ref Range   VITD 32.56 30.00 - 100.00 ng/mL  Lipase  Result Value Ref Range   Lipase 9.0 (L) 11.0 - 59.0 U/L   Lab Results  Component Value Date   TSH 1.99 02/13/2018   T3TOTAL 117.1 03/22/2015   Assessment & Plan:   Problem List Items Addressed This Visit    Pedal edema - Primary    Ongoing over last several weeks despite increase in lasix and limited sodium intake. Will check labwork including thyroid function in h/o graves s/p radioiodine ablation. Not consistent with CHF, cirrhosis, or DVT related swelling. Will update with lab results and plan. Continue supportive measures in interim. Pt agrees with plan.       Relevant Orders   Comprehensive metabolic panel   TSH   CBC with Differential/Platelet   T4, free   Microalbumin / creatinine urine ratio   Nodular goiter, toxic or with hyperthyroidism    H/o this s/p radiation iodine ablation 2015. Update labs.       Relevant Orders   TSH   T4, free       No orders of the defined types were placed in this encounter.  Orders Placed This Encounter  Procedures   Comprehensive metabolic panel   TSH   CBC with Differential/Platelet   T4, free   Microalbumin / creatinine urine ratio    Follow up plan: Return if symptoms worsen or fail to improve.  Ria Bush, MD

## 2019-01-21 NOTE — Patient Instructions (Addendum)
Labs today to further evaluate leg swelling.  I am suspicious for thyroid being off track.  We will be in touch with results and plan.  For now, continue lasix twice daily with potassium twice daily and leg elevation as much as you can, as well as continue compression stockings and limiting salt/sodium in the diet.

## 2019-01-21 NOTE — Assessment & Plan Note (Signed)
Ongoing over last several weeks despite increase in lasix and limited sodium intake. Will check labwork including thyroid function in h/o graves s/p radioiodine ablation. Not consistent with CHF, cirrhosis, or DVT related swelling. Will update with lab results and plan. Continue supportive measures in interim. Pt agrees with plan.

## 2019-01-21 NOTE — Assessment & Plan Note (Signed)
H/o this s/p radiation iodine ablation 2015. Update labs.

## 2019-01-22 LAB — COMPREHENSIVE METABOLIC PANEL
ALT: 14 U/L (ref 0–35)
AST: 19 U/L (ref 0–37)
Albumin: 3.9 g/dL (ref 3.5–5.2)
Alkaline Phosphatase: 95 U/L (ref 39–117)
BUN: 12 mg/dL (ref 6–23)
CO2: 27 mEq/L (ref 19–32)
Calcium: 9 mg/dL (ref 8.4–10.5)
Chloride: 104 mEq/L (ref 96–112)
Creatinine, Ser: 0.97 mg/dL (ref 0.40–1.20)
GFR: 66.5 mL/min (ref >60.00)
Glucose, Bld: 99 mg/dL (ref 70–99)
Potassium: 3.7 mEq/L (ref 3.5–5.1)
Sodium: 140 mEq/L (ref 135–145)
Total Bilirubin: 0.4 mg/dL (ref 0.2–1.2)
Total Protein: 7.1 g/dL (ref 6.0–8.3)

## 2019-01-22 LAB — CBC WITH DIFFERENTIAL/PLATELET
Basophils Absolute: 0.1 10*3/uL (ref 0.0–0.1)
Basophils Relative: 1.5 % (ref 0.0–3.0)
Eosinophils Absolute: 0.2 10*3/uL (ref 0.0–0.7)
Eosinophils Relative: 1.8 % (ref 0.0–5.0)
HCT: 34.9 % — ABNORMAL LOW (ref 36.0–46.0)
Hemoglobin: 11.7 g/dL — ABNORMAL LOW (ref 12.0–15.0)
Lymphocytes Relative: 23.9 % (ref 12.0–46.0)
Lymphs Abs: 2.1 10*3/uL (ref 0.7–4.0)
MCHC: 33.5 g/dL (ref 30.0–36.0)
MCV: 88.1 fl (ref 78.0–100.0)
Monocytes Absolute: 0.7 10*3/uL (ref 0.1–1.0)
Monocytes Relative: 8.2 % (ref 3.0–12.0)
Neutro Abs: 5.6 10*3/uL (ref 1.4–7.7)
Neutrophils Relative %: 64.6 % (ref 43.0–77.0)
Platelets: 204 10*3/uL (ref 150.0–400.0)
RBC: 3.96 Mil/uL (ref 3.87–5.11)
RDW: 14.8 % (ref 11.5–15.5)
WBC: 8.7 10*3/uL (ref 4.0–10.5)

## 2019-01-22 LAB — TSH: TSH: 9.25 u[IU]/mL — ABNORMAL HIGH (ref 0.35–4.50)

## 2019-01-22 LAB — T4, FREE: Free T4: 0.51 ng/dL — ABNORMAL LOW (ref 0.60–1.60)

## 2019-01-22 LAB — MICROALBUMIN / CREATININE URINE RATIO
Creatinine,U: 193.2 mg/dL
Microalb Creat Ratio: 0.4 mg/g (ref 0.0–30.0)
Microalb, Ur: 0.8 mg/dL (ref 0.0–1.9)

## 2019-01-23 ENCOUNTER — Other Ambulatory Visit: Payer: Self-pay | Admitting: Family Medicine

## 2019-01-23 MED ORDER — LEVOTHYROXINE SODIUM 50 MCG PO TABS: 50.0000 ug | ORAL_TABLET | Freq: Every day | ORAL | 6 refills | Status: DC

## 2019-01-25 ENCOUNTER — Emergency Department
Admission: EM | Admit: 2019-01-25 | Discharge: 2019-01-25 | Disposition: A | Discharge status: Home / Self Care | Payer: Medicare PPO | Attending: Emergency Medicine | Admitting: Emergency Medicine

## 2019-01-25 ENCOUNTER — Other Ambulatory Visit: Payer: Self-pay

## 2019-01-25 ENCOUNTER — Encounter: Payer: Self-pay | Admitting: Emergency Medicine

## 2019-01-25 ENCOUNTER — Emergency Department: Payer: Medicare PPO

## 2019-01-25 DIAGNOSIS — I110 Hypertensive heart disease with heart failure: Secondary | ICD-10-CM | POA: Insufficient documentation

## 2019-01-25 DIAGNOSIS — Z79899 Other long term (current) drug therapy: Secondary | ICD-10-CM | POA: Diagnosis not present

## 2019-01-25 DIAGNOSIS — D649 Anemia, unspecified: Secondary | ICD-10-CM | POA: Diagnosis not present

## 2019-01-25 DIAGNOSIS — I2510 Atherosclerotic heart disease of native coronary artery without angina pectoris: Secondary | ICD-10-CM | POA: Diagnosis not present

## 2019-01-25 DIAGNOSIS — R2242 Localized swelling, mass and lump, left lower limb: Secondary | ICD-10-CM | POA: Diagnosis present

## 2019-01-25 DIAGNOSIS — I509 Heart failure, unspecified: Secondary | ICD-10-CM

## 2019-01-25 DIAGNOSIS — M7989 Other specified soft tissue disorders: Secondary | ICD-10-CM | POA: Diagnosis not present

## 2019-01-25 DIAGNOSIS — J811 Chronic pulmonary edema: Secondary | ICD-10-CM | POA: Diagnosis not present

## 2019-01-25 DIAGNOSIS — I4510 Unspecified right bundle-branch block: Secondary | ICD-10-CM | POA: Diagnosis not present

## 2019-01-25 LAB — BASIC METABOLIC PANEL
Anion gap: 8 (ref 5–15)
BUN: 14 mg/dL (ref 8–23)
CO2: 28 mmol/L (ref 22–32)
Calcium: 8.8 mg/dL — ABNORMAL LOW (ref 8.9–10.3)
Chloride: 104 mmol/L (ref 98–111)
Creatinine, Ser: 1.01 mg/dL — ABNORMAL HIGH (ref 0.44–1.00)
GFR calc Af Amer: >60 mL/min (ref >60)
GFR calc non Af Amer: 52 mL/min — ABNORMAL LOW (ref >60)
Glucose, Bld: 97 mg/dL (ref 70–99)
Potassium: 3.7 mmol/L (ref 3.5–5.1)
Sodium: 140 mmol/L (ref 135–145)

## 2019-01-25 LAB — CBC WITH DIFFERENTIAL/PLATELET
Abs Immature Granulocytes: 0.02 10*3/uL (ref 0.00–0.07)
Basophils Absolute: 0 10*3/uL (ref 0.0–0.1)
Basophils Relative: 0 %
Eosinophils Absolute: 0.3 10*3/uL (ref 0.0–0.5)
Eosinophils Relative: 4 %
HCT: 33.8 % — ABNORMAL LOW (ref 36.0–46.0)
Hemoglobin: 10.9 g/dL — ABNORMAL LOW (ref 12.0–15.0)
Immature Granulocytes: 0 %
Lymphocytes Relative: 31 %
Lymphs Abs: 2.2 10*3/uL (ref 0.7–4.0)
MCH: 28.8 pg (ref 26.0–34.0)
MCHC: 32.2 g/dL (ref 30.0–36.0)
MCV: 89.4 fL (ref 80.0–100.0)
Monocytes Absolute: 0.8 10*3/uL (ref 0.1–1.0)
Monocytes Relative: 11 %
Neutro Abs: 4 10*3/uL (ref 1.7–7.7)
Neutrophils Relative %: 54 %
Platelets: 181 10*3/uL (ref 150–400)
RBC: 3.78 MIL/uL — ABNORMAL LOW (ref 3.87–5.11)
RDW: 14.1 % (ref 11.5–15.5)
WBC: 7.4 10*3/uL (ref 4.0–10.5)
nRBC: 0 % (ref 0.0–0.2)

## 2019-01-25 LAB — BRAIN NATRIURETIC PEPTIDE: B Natriuretic Peptide: 90 pg/mL (ref 0.0–100.0)

## 2019-01-25 IMAGING — CR CHEST - 2 VIEW
2 series · 2 of 2 positions shown · non-contrast
Comparison: [DATE]

CLINICAL DATA: Bilateral leg swelling.

EXAM:
CHEST - 2 VIEW

[chest pa]
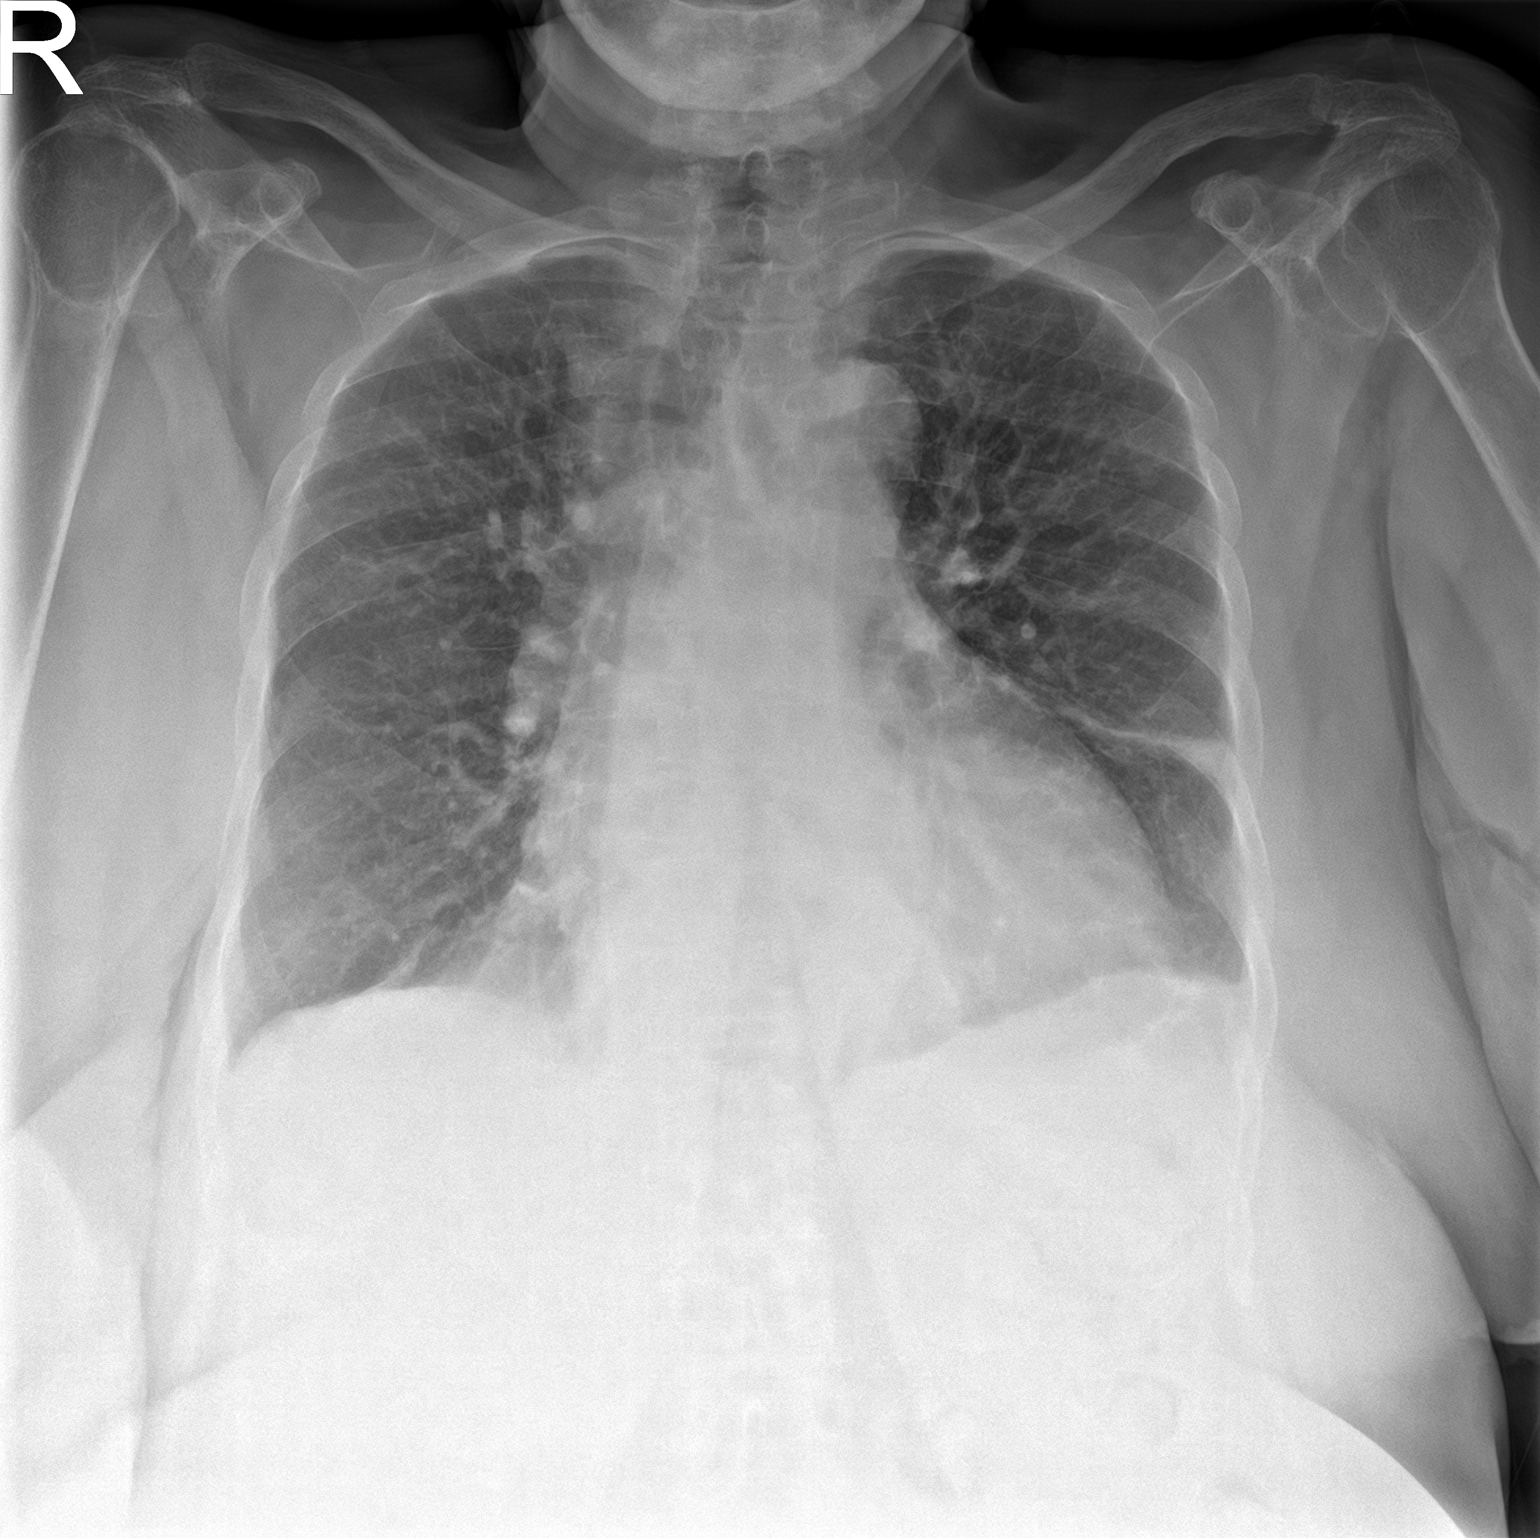

[chest lat]
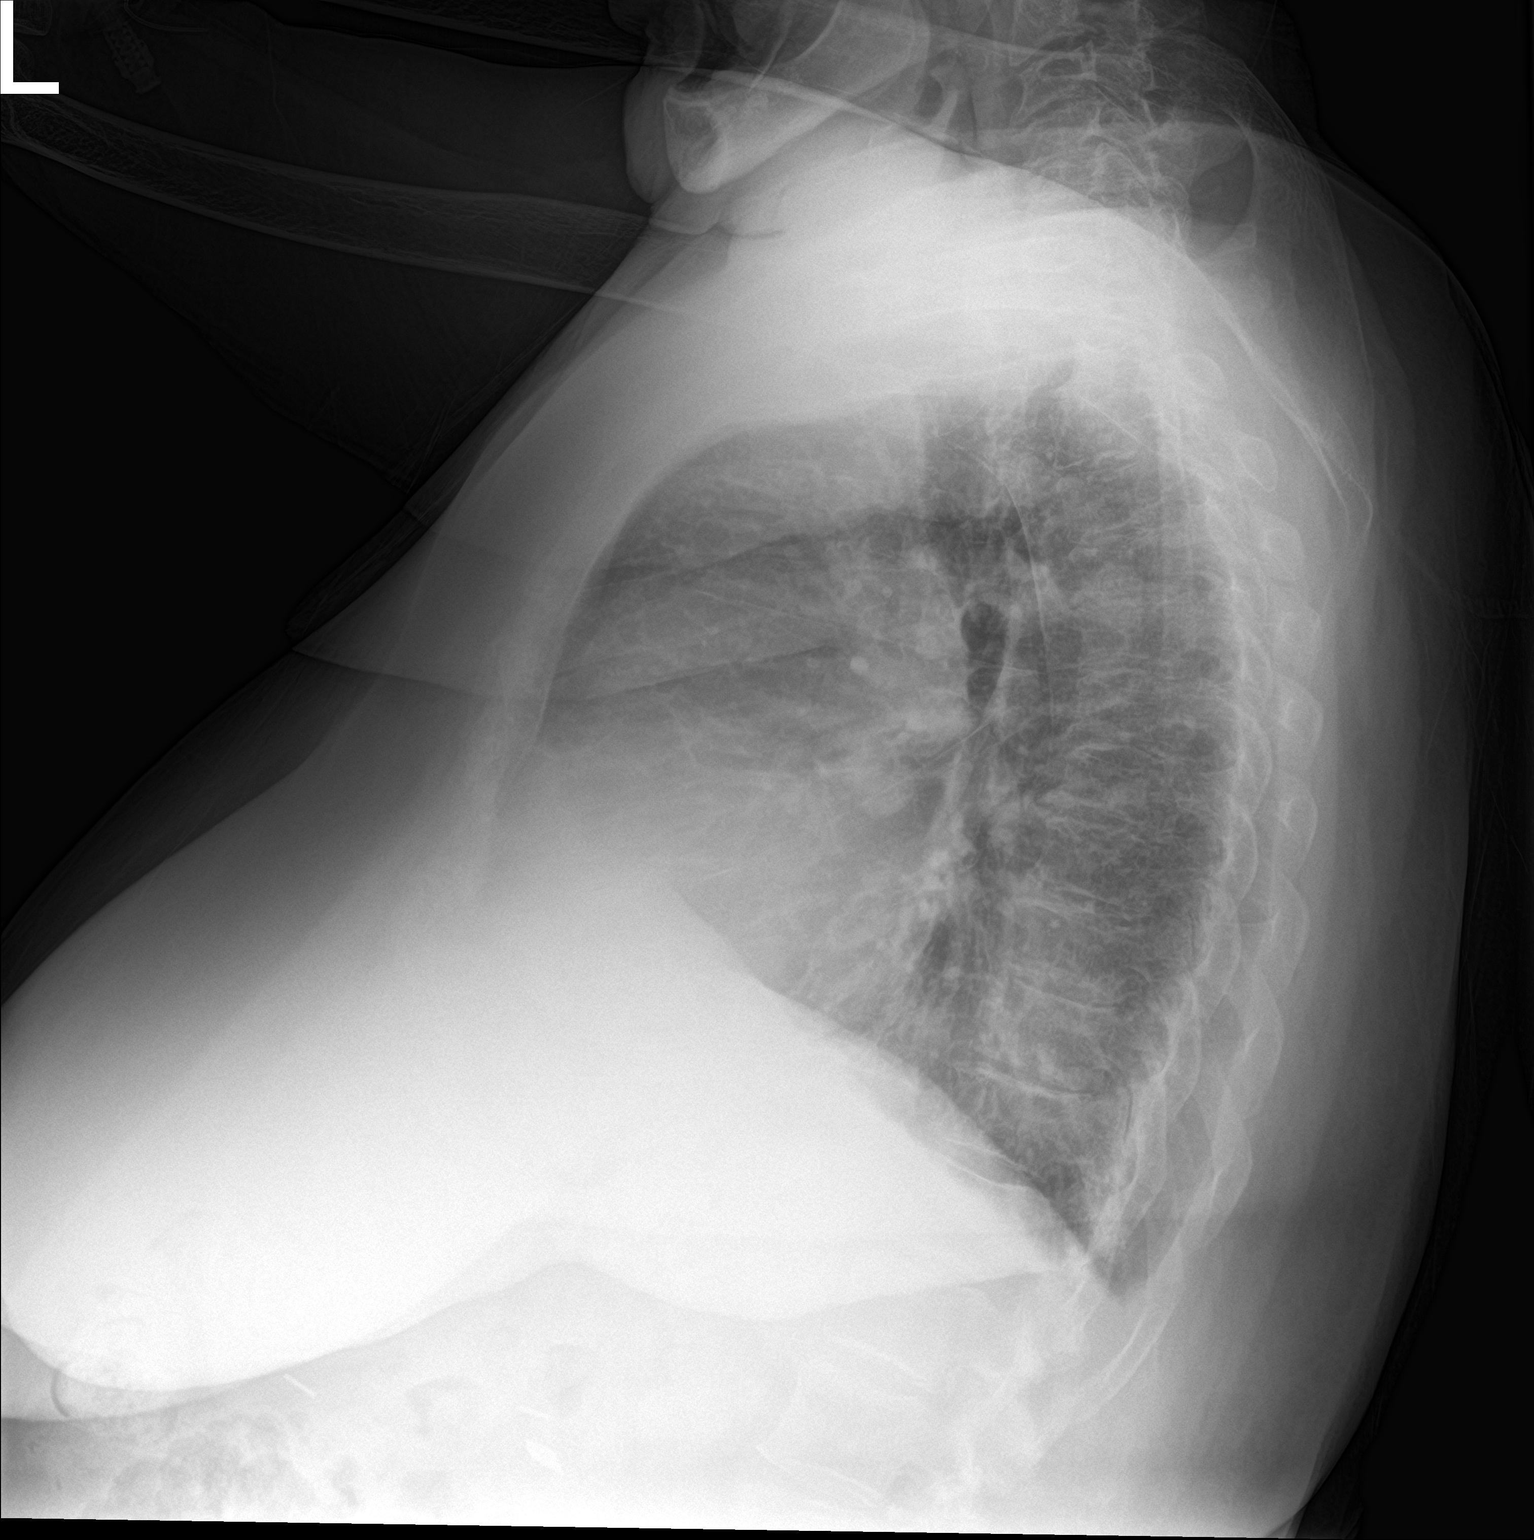

[2 of 2 positions shown; findings below may reference images not displayed]

FINDINGS: Unchanged heart size and mediastinal contours with mild
cardiomegaly. Retrocardiac hiatal hernia again seen. Slight
peribronchial thickening suggesting mild edema. Trace pleural
effusions with suspected fluid in the right interlobar fissure. No
focal airspace disease or pneumothorax. No acute osseous
abnormalities are seen.
IMPRESSION: 1. Mild pulmonary edema and trace pleural effusions. Mild
cardiomegaly.
2. Retrocardiac hiatal hernia.

## 2019-01-25 IMAGING — US VENOUS DOPPLER ULTRASOUND OF  LOWER EXTREMITIES
1 series · 13 of 24 positions shown · non-contrast
Comparison: Right lower extremity venous Doppler [DATE]

CLINICAL DATA: Bilateral lower extremity swelling.



[Series 1: venous doppler ultrasound of lower extremities · 67 acquisitions, 13 frames shown]
[im 1/67]
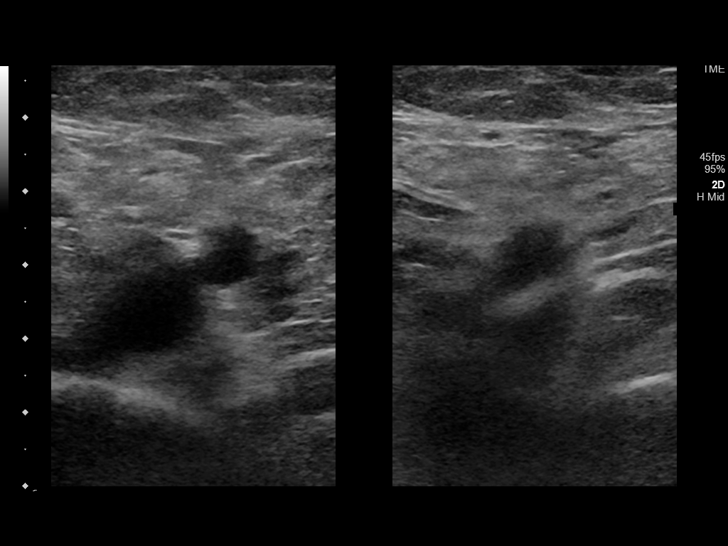
[im 6/67]
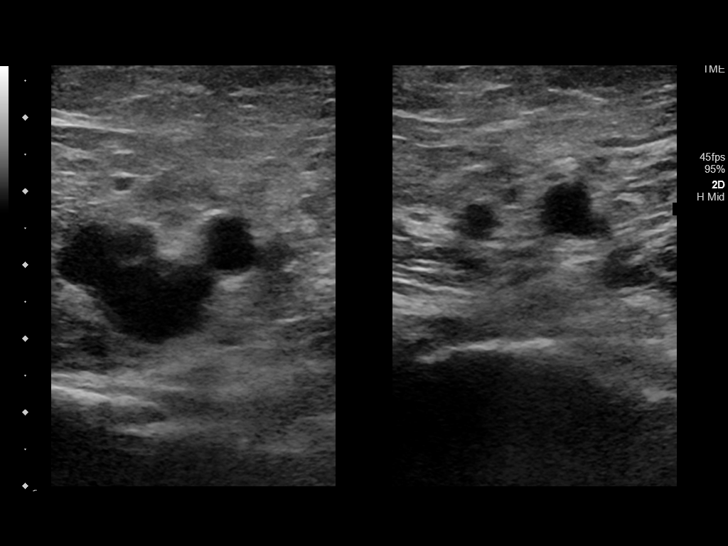
[im 12/67]
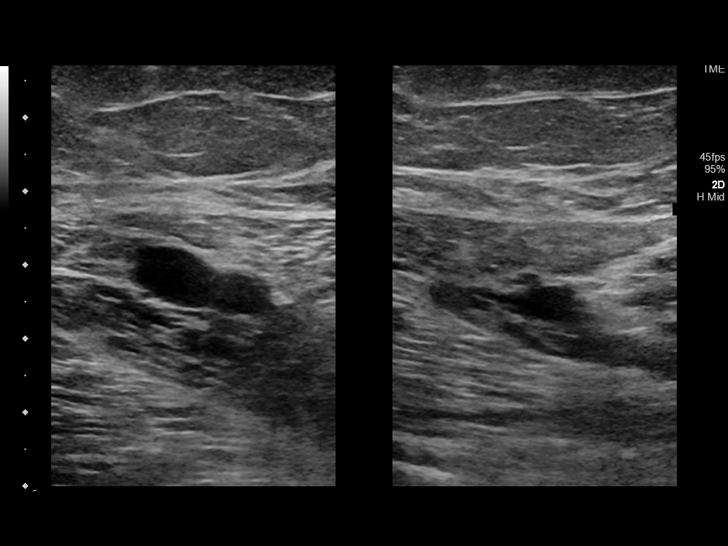
[im 18/67]
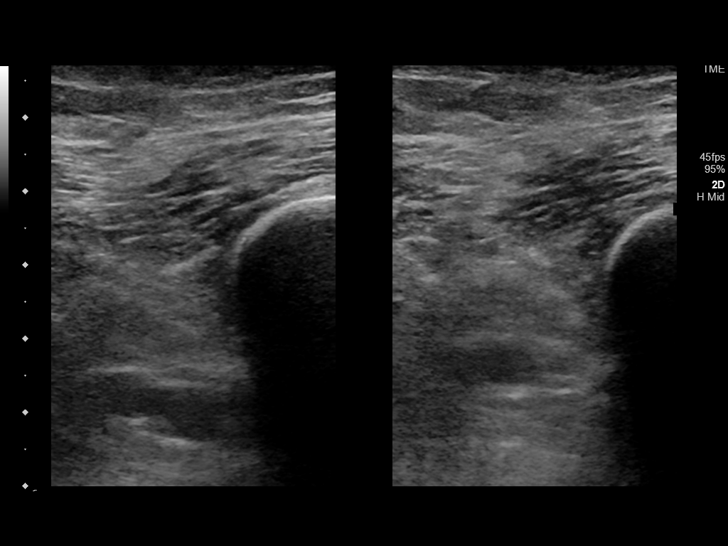
[im 23/67]
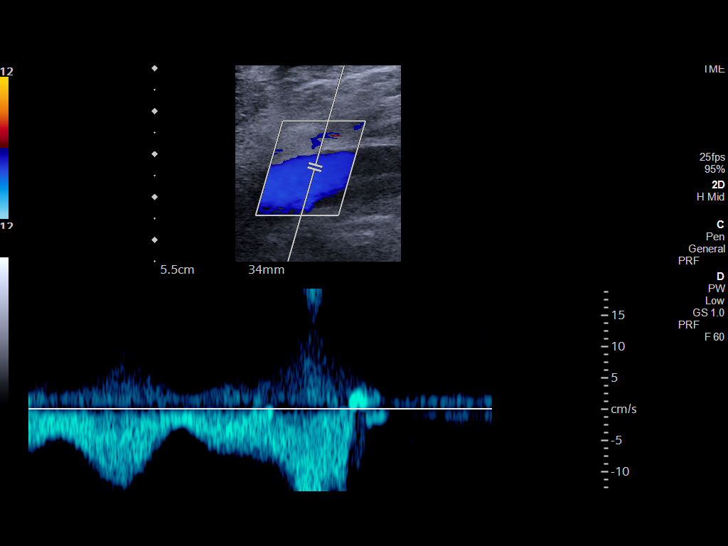
[im 29/67]
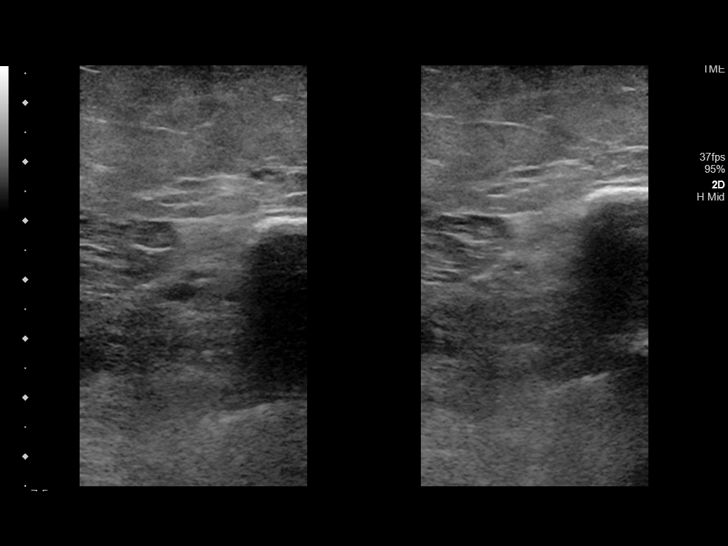
[im 38/67]
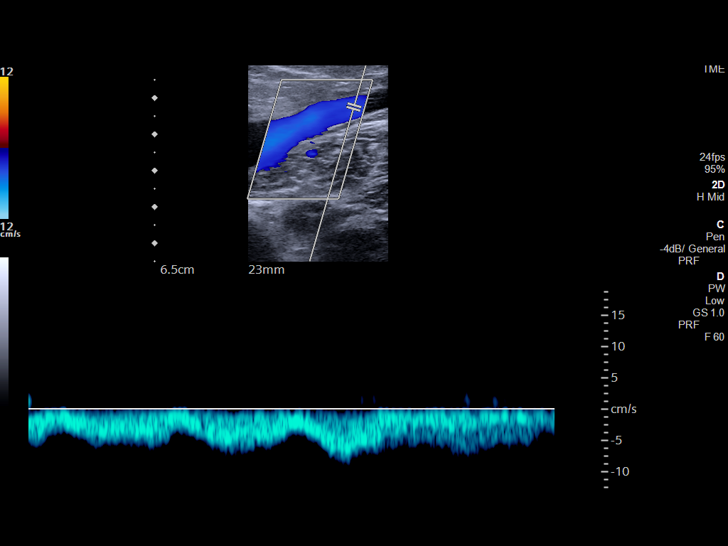
[im 41/67]
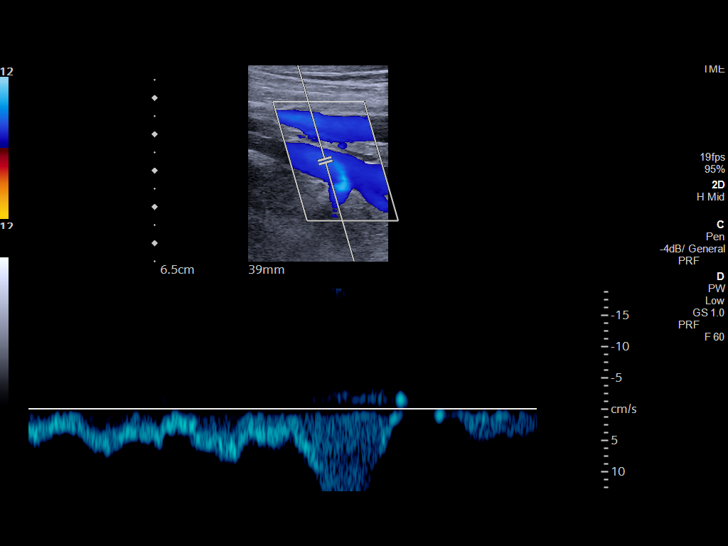
[im 46/67]
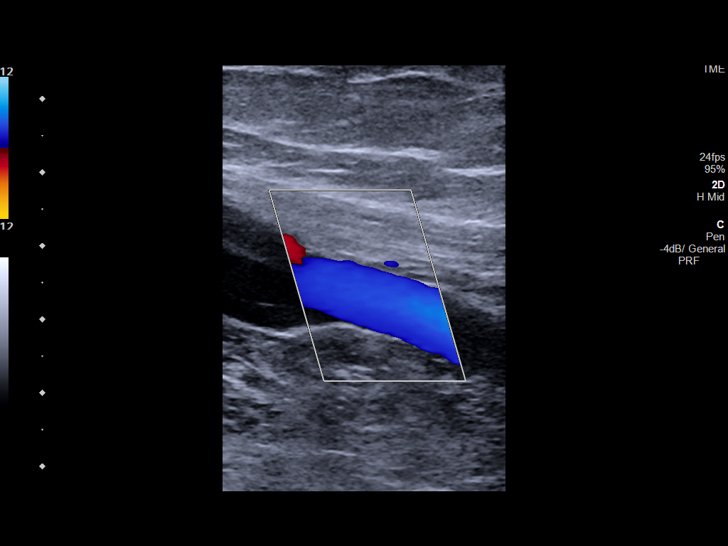
[im 52/67]
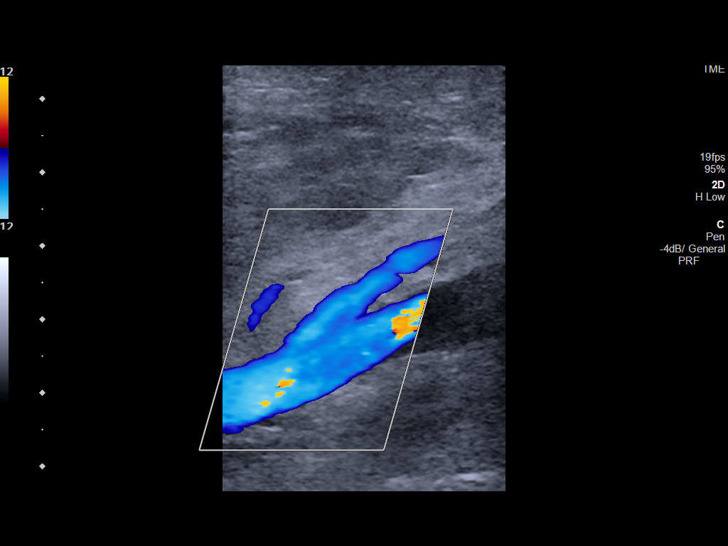
[im 58/67]
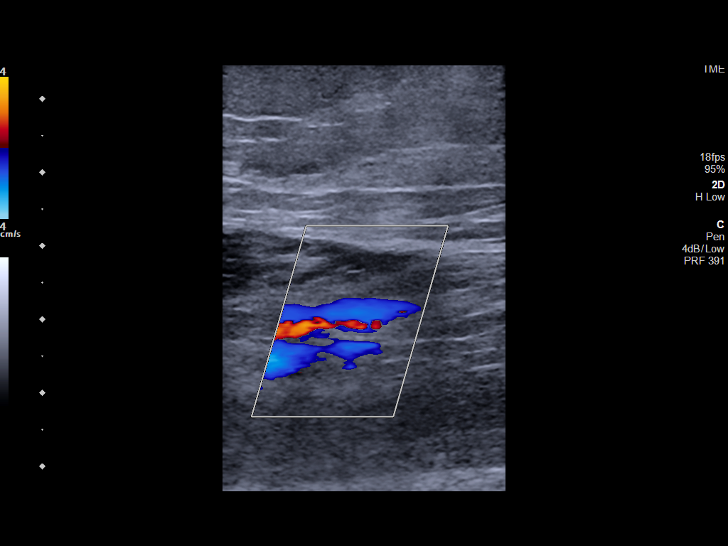
[im 64/67]
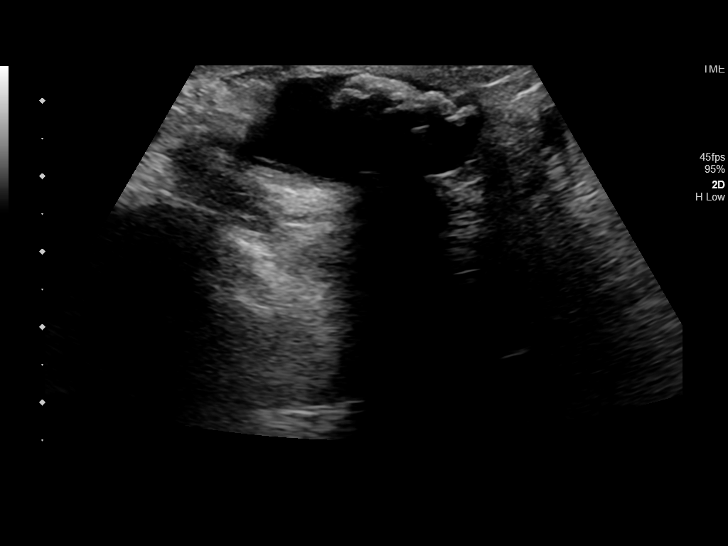
[im 67/67]
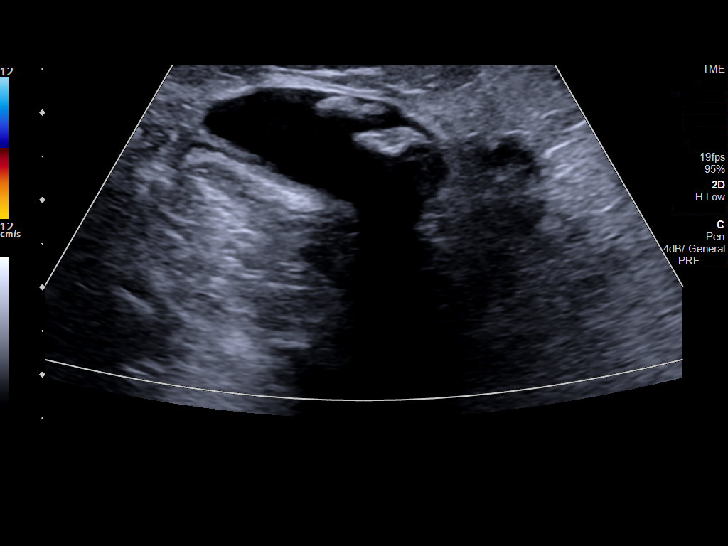

[13 of 24 positions shown; findings below may reference images not displayed]

FINDINGS: RIGHT LOWER EXTREMITY

Common Femoral Vein: No evidence of thrombus. Normal
compressibility, respiratory phasicity and response to augmentation.

Saphenofemoral Junction: No evidence of thrombus. Normal
compressibility and flow on color Doppler imaging.

Profunda Femoral Vein: No evidence of thrombus. Normal
compressibility and flow on color Doppler imaging.

Femoral Vein: No evidence of thrombus. Normal compressibility,
respiratory phasicity and response to augmentation.

Popliteal Vein: No evidence of thrombus. Normal compressibility,
respiratory phasicity and response to augmentation.

Calf Veins: No evidence of thrombus. Normal compressibility and flow
on color Doppler imaging.

Superficial Great Saphenous Vein: No evidence of thrombus. Normal
compressibility.

Venous Reflux:  None.

Other Findings: Fluid collection in the popliteal fossa measuring
5.6 x 1 5 x 3.2 cm contains shadowing calcified bodies. This is
unchanged from prior exam.

LEFT LOWER EXTREMITY

Common Femoral Vein: No evidence of thrombus. Normal
compressibility, respiratory phasicity and response to augmentation.

Saphenofemoral Junction: No evidence of thrombus. Normal
compressibility and flow on color Doppler imaging.

Profunda Femoral Vein: No evidence of thrombus. Normal
compressibility and flow on color Doppler imaging.

Femoral Vein: No evidence of thrombus. Normal compressibility,
respiratory phasicity and response to augmentation.

Popliteal Vein: No evidence of thrombus. Normal compressibility,
respiratory phasicity and response to augmentation.

Calf Veins: No evidence of thrombus. Normal compressibility and flow
on color Doppler imaging.

Superficial Great Saphenous Vein: No evidence of thrombus. Normal
compressibility.

Venous Reflux:  None.

Other Findings:  None.
IMPRESSION: 1. No evidence of deep venous thrombosis in either lower extremity.
2. Chronic right Baker cyst containing calcified loose bodies.

## 2019-01-25 NOTE — Discharge Instructions (Signed)
Take furosemide 20mg  once a day. Elevate your legs. Use compression stockings. Follow up with Dr. Nehemiah Massed or one of his partners on Monday for further evaluation. Return to the ER for chest pain or shortness of breath.

## 2019-01-25 NOTE — ED Notes (Signed)
Peripheral IV discontinued. Catheter intact. No signs of infiltration or redness. Gauze applied to IV site.   Discharge instructions reviewed with patient. Questions fielded by this RN. Patient verbalizes understanding of instructions. Patient discharged home in stable condition per veronese. No acute distress noted at time of discharge.   Pt wheeled to son's car by Modena Nunnery, RN

## 2019-01-25 NOTE — ED Triage Notes (Signed)
Pt arrives POV to triage with c/o leg swelling which started last Monday. Pt states that she went to her doctor who told her fluid pill twice a day instead of PRN. Pt states that she has been having increased urinary output with her increased "fluid pill". Pt has +2 edema bilaterally at this time with diminished but clear lung sounds. Pt is in NAD.

## 2019-01-25 NOTE — ED Notes (Signed)
Pt label unscannable

## 2019-01-25 NOTE — ED Notes (Addendum)
Pt was able to get up unassisted from stretcher; ambulated about 40 feet around nurse's station; pt's sats dropped to 93% for 1-2 seconds but remained at 95-99% the rest of the time; pt denied dizziness while ambulating; rested hand on my arm while ambulating but required no assistance;

## 2019-01-25 NOTE — ED Provider Notes (Signed)
[facility] Emergency Department Provider Note  ____________________________________________  Time seen: Approximately 8:56 PM  I have reviewed the triage vital signs and the nursing notes.   HISTORY  Chief Complaint Leg Swelling   HPI Brianna Woods is a 84 y.o. female with history of anemia, asthma, CAD, hypertension, hyperlipidemia, hypothyroidism who presents for evaluation of bilateral leg swelling.  Patient reports that her symptoms started 4 days ago.  They have been constant.  She saw her doctor 4 days ago who increased her Lasix.  She denies any significant improvement of the swelling.  She denies pain or fever, chest pain or shortness of breath.  She denies prior history of congestive heart failure or swelling of her legs.  She denies personal family history of blood clots, recent travel immobilization, leg pain or swelling, hemoptysis, or exogenous hormones.   Past Medical History:  Diagnosis Date  . Anemia   . Arthritis   . Asthma    per pt  . Atherosclerosis of abdominal aorta ([place]) 07/2015   by CT  . Barrett's esophagus    on EGD 2008, EGD WNL 2013  . CAD (coronary artery disease) 07/2015   of LAD by CT  . Diverticulosis 2014   sigmoid on colonoscopy  . Fatty pancreas 07/2015   by CT  . Fibromyalgia    per pt, no records of this   . GERD (gastroesophageal reflux disease)    and esoph stricture s/p dilation 2008 HH by CT 2016  . Glaucoma   . History of anxiety    was on prozac then effexor (pt denies h/o anxiety/depression)  . History of cardiac murmur   . History of chicken pox   . History of CVA (cerebrovascular accident) 2008   "I've had several TIAs"  . History of right bundle branch block 2011  . History of ulcer disease    per pt, no records of this  . HLD (hyperlipidemia)   . HTN (hypertension)   . Kidney cyst, acquired 07/2015   by CT, rec rpt renal MRI in 6 months  . Mixed incontinence    on ditropan  .  Nephrolithiasis    s/p surgery R kidney (70mm) 11/2009  . Nodular goiter, toxic or with hyperthyroidism    h/o toxic, on methimazole, known large left thyroid nodule 08/2013 s/p beign biopsy per patient 2010, saw Dr. Gabriel Carina, Rad I ablation 04/2014  . Vitamin D deficiency     Patient Active Problem List   Diagnosis Date Noted  . Injury of face 11/21/2018  . MDD (major depressive disorder), single episode, moderate ([place]) 07/09/2018  . Glaucoma   . Osteopenia 06/09/2018  . Hiatal hernia 06/09/2018  . Diverticulosis 06/09/2018  . Weight loss 05/23/2018  . Dysphagia 05/23/2018  . Insomnia 05/23/2018  . Uterine mass 05/23/2018  . Tricompartment osteoarthritis of right knee 02/13/2018  . Skin rash 04/21/2016  . Increased endometrial stripe thickness 10/21/2015  . Nephrolithiasis   . Pedal edema 09/25/2015  . Generalized abdominal pain 07/26/2015  . Kidney cyst, acquired 07/13/2015  . CAD (coronary artery disease) 07/13/2015  . Atherosclerosis of abdominal aorta ([place]) 07/13/2015  . Fatty pancreas 07/13/2015  . Health maintenance examination 03/22/2015  . Intertrigo 03/22/2015  . Advanced care planning/counseling discussion 09/21/2014  . Medicare annual wellness visit, subsequent 03/20/2014  . Urinary incontinence 03/09/2014  . Chest discomfort 08/15/2013  . Anxiety attack 05/18/2012  . History of right bundle branch block   . Vitamin D deficiency 02/10/2012  .  Dizziness 01/22/2012  . Fibromyalgia   . History of CVA (cerebrovascular accident)   . Asthma   . Nodular goiter, toxic or with hyperthyroidism   . GERD (gastroesophageal reflux disease)   . Mixed incontinence   . HTN (hypertension)   . HLD (hyperlipidemia)     Past Surgical History:  Procedure Laterality Date  . CATARACT EXTRACTION  1990   w/ implants  . CHOLECYSTECTOMY  2006  . COLONOSCOPY  10/02/12   diverticulosis, o/w WNL ([place])  . dexa     no records received  . ESOPHAGOGASTRODUODENOSCOPY  11/2011   LA grade A  esophagitis lower 1/3, dilated, med HH, o/w WNL - path: + GERD, no barrett's - f/u 11/2016  . LITHOTRIPSY Right 2011  . thyroid uptake scan  04/2014   unifrm uptake, enlarged thyroid consistent with grave's dz    Prior to Admission medications   Medication Sig Start Date End Date Taking? Authorizing Provider  albuterol (VENTOLIN HFA) 108 (90 Base) MCG/ACT inhaler Inhale 2 puffs into the lungs every 6 (six) hours as needed. for wheezing 05/23/18   Ria Bush, MD  amLODipine (NORVASC) 5 MG tablet TAKE 1 TABLET EVERY DAY 01/11/19   Ria Bush, MD  aspirin EC 81 MG tablet Take 1 tablet (81 mg total) by mouth daily. 04/21/16   Ria Bush, MD  atorvastatin (LIPITOR) 40 MG tablet TAKE 1 TABLET EVERY MONDAY, [place]. 07/26/18   Ria Bush, MD  cholecalciferol 5000 UNITS TABS Take 5,000 Units by mouth daily. 10/05/14   Ria Bush, MD  cloNIDine (CATAPRES) 0.1 MG tablet TAKE 1 TABLET TWICE DAILY AS NEEDED (1 TABLET IN THE MORNING AND A SECOND TABLET IF BLOOD PRESSURE OVER 150/100) 01/11/19   Ria Bush, MD  esomeprazole (NEXIUM) 40 MG capsule TAKE 1 CAPSULE EVERY DAY AT 12 NOON 01/11/19   Ria Bush, MD  famotidine (PEPCID) 40 MG tablet Take 1 tablet (40 mg total) by mouth at bedtime. 11/21/18   Ria Bush, MD  furosemide (LASIX) 20 MG tablet Take 1 tablet (20 mg total) by mouth daily as needed for edema. 12/11/18 12/10/19  Ria Bush, MD  gabapentin (NEURONTIN) 300 MG capsule Take 1 capsule (300 mg total) by mouth 2 (two) times daily. 01/15/19   Ria Bush, MD  levothyroxine (SYNTHROID) 50 MCG tablet Take 1 tablet (50 mcg total) by mouth daily. 01/23/19   Ria Bush, MD  metoprolol tartrate (LOPRESSOR) 25 MG tablet TAKE 1 TABLET TWICE DAILY 01/11/19   Ria Bush, MD  ondansetron (ZOFRAN) 4 MG tablet TAKE 1 TABLET EVERY 12 HOURS AS NEEDED FOR NAUSEA 05/23/18   Ria Bush, MD  oxybutynin (DITROPAN-XL) 10 MG 24 hr tablet Take  1 tablet (10 mg total) by mouth 2 (two) times a day. 01/15/19   Ria Bush, MD  potassium chloride (K-DUR) 10 MEQ tablet TAKE 1 TABLET   DAILY AS NEEDED (TAKE WITH LASIX). 01/11/19   Ria Bush, MD  sertraline (ZOLOFT) 50 MG tablet Take 1 tablet (50 mg total) by mouth daily. 01/15/19   Ria Bush, MD  traZODone (DESYREL) 100 MG tablet TAKE 1/2 TO 1 TABLET AT BEDTIME 01/11/19   Ria Bush, MD  vitamin C (ASCORBIC ACID) 500 MG tablet Take 500 mg by mouth daily.    [provider]  pirbuterol (MAXAIR) 200 MCG/INH inhaler Inhale 2 puffs into the lungs 4 (four) times daily. 09/28/11 01/22/12  Ria Bush, MD    Allergies Ivp dye [iodinated diagnostic agents]; Penicillins; and Statins  Family  History  Problem Relation Age of Onset  . Hyperlipidemia Mother   . Hypertension Mother   . Stroke Father        hemorrhage  . Aneurysm Father 4       deceased  . Other Brother        TB  . Coronary artery disease Brother   . Aneurysm Son 68       sudden death  . Diabetes Maternal Aunt   . Cancer Brother 39       lung    Social History Social History   Tobacco Use  . Smoking status: Never Smoker  . Smokeless tobacco: Never Used  Substance Use Topics  . Alcohol use: No    Alcohol/week: 0.0 standard drinks  . Drug use: No    Review of Systems  Constitutional: Negative for fever. Eyes: Negative for visual changes. ENT: Negative for sore throat. Neck: No neck pain  Cardiovascular: Negative for chest pain. Respiratory: Negative for shortness of breath. Gastrointestinal: Negative for abdominal pain, vomiting or diarrhea. Genitourinary: Negative for dysuria. Musculoskeletal: Negative for back pain. + b/l leg swelling Skin: Negative for rash. Neurological: Negative for headaches, weakness or numbness. Psych: No SI or HI  ____________________________________________   PHYSICAL EXAM:  VITAL SIGNS: ED Triage Vitals  Enc Vitals Group     BP 01/25/19  1915 124/60     Pulse Rate 01/25/19 1915 (!) 56     Resp 01/25/19 1915 (!) 22     Temp 01/25/19 1915 97.6 F (36.4 C)     Temp Source 01/25/19 1915 Oral     SpO2 01/25/19 1914 99 %     Weight 01/25/19 1916 214 lb (97.1 kg)     Height 01/25/19 1916 5' (1.524 m)     Head Circumference --      Peak Flow --      Pain Score 01/25/19 1916 8     Pain Loc --      Pain Edu? --      Excl. in [place]? --     Constitutional: Alert and oriented. Well appearing and in no apparent distress. HEENT:      Head: Normocephalic and atraumatic.         Eyes: Conjunctivae are normal. Sclera is non-icteric.       Mouth/Throat: Mucous membranes are moist.       Neck: Supple with no signs of meningismus. Cardiovascular: Regular rhythm with bradycardic rate. No murmurs, gallops, or rubs. 2+ symmetrical distal pulses are present in all extremities. No JVD. Respiratory: Normal respiratory effort. Lungs are clear to auscultation bilaterally. No wheezes, crackles, or rhonchi.  Gastrointestinal: Soft, non tender, and non distended with positive bowel sounds. No rebound or guarding. Musculoskeletal: Nonpitting edema of bilateral lower extremities with no erythema or warmth, normal strong distal pulses  neurologic: Normal speech and language. Face is symmetric. Moving all extremities. No gross focal neurologic deficits are appreciated. Skin: Skin is warm, dry and intact. No rash noted. Psychiatric: Mood and affect are normal. Speech and behavior are normal.  ____________________________________________   LABS (all labs ordered are listed, but only abnormal results are displayed)  Labs Reviewed  CBC WITH DIFFERENTIAL/PLATELET - Abnormal; Notable for the following components:      Result Value   RBC 3.78 (*)    Hemoglobin 10.9 (*)    HCT 33.8 (*)    All other components within normal limits  BASIC METABOLIC PANEL - Abnormal; Notable for the following components:  Creatinine, Ser 1.01 (*)    Calcium 8.8 (*)     GFR calc non Af Amer 52 (*)    All other components within normal limits  BRAIN NATRIURETIC PEPTIDE   ____________________________________________  EKG  ED ECG REPORT I, Rudene Re, the attending physician, personally viewed and interpreted this ECG.  Normal sinus rhythm, rate of 63, right bundle branch block, borderline prolonged QTC, normal axis, no ST elevations or depressions.  Unchanged from prior. ____________________________________________  RADIOLOGY  I have personally reviewed the images performed during this visit and I agree with the Radiologist's read.   Interpretation by Radiologist:  Dg Chest 2 View  Result Date: 01/25/2019 CLINICAL DATA:  Bilateral leg swelling. EXAM: CHEST - 2 VIEW COMPARISON:  12/06/2015 FINDINGS: Unchanged heart size and mediastinal contours with mild cardiomegaly. Retrocardiac hiatal hernia again seen. Slight peribronchial thickening suggesting mild edema. Trace pleural effusions with suspected fluid in the right interlobar fissure. No focal airspace disease or pneumothorax. No acute osseous abnormalities are seen. IMPRESSION: 1. Mild pulmonary edema and trace pleural effusions. Mild cardiomegaly. 2. Retrocardiac hiatal hernia. Electronically Signed   By: Keith Rake M.D.   On: 01/25/2019 22:21   US Venous Img Lower Bilateral  Result Date: 01/25/2019 CLINICAL DATA:  Bilateral lower extremity swelling. EXAM: BILATERAL LOWER EXTREMITY VENOUS DOPPLER ULTRASOUND TECHNIQUE: Gray-scale sonography with graded compression, as well as color Doppler and duplex ultrasound were performed to evaluate the lower extremity deep venous systems from the level of the common femoral vein and including the common femoral, femoral, profunda femoral, popliteal and calf veins including the posterior tibial, peroneal and gastrocnemius veins when visible. The superficial great saphenous vein was also interrogated. Spectral Doppler was utilized to evaluate flow at rest  and with distal augmentation maneuvers in the common femoral, femoral and popliteal veins. COMPARISON:  Right lower extremity venous Doppler 12/23/2013 FINDINGS: RIGHT LOWER EXTREMITY Common Femoral Vein: No evidence of thrombus. Normal compressibility, respiratory phasicity and response to augmentation. Saphenofemoral Junction: No evidence of thrombus. Normal compressibility and flow on color Doppler imaging. Profunda Femoral Vein: No evidence of thrombus. Normal compressibility and flow on color Doppler imaging. Femoral Vein: No evidence of thrombus. Normal compressibility, respiratory phasicity and response to augmentation. Popliteal Vein: No evidence of thrombus. Normal compressibility, respiratory phasicity and response to augmentation. Calf Veins: No evidence of thrombus. Normal compressibility and flow on color Doppler imaging. Superficial Great Saphenous Vein: No evidence of thrombus. Normal compressibility. Venous Reflux:  None. Other Findings: Fluid collection in the popliteal fossa measuring 5.6 x 1 5 x 3.2 cm contains shadowing calcified bodies. This is unchanged from prior exam. LEFT LOWER EXTREMITY Common Femoral Vein: No evidence of thrombus. Normal compressibility, respiratory phasicity and response to augmentation. Saphenofemoral Junction: No evidence of thrombus. Normal compressibility and flow on color Doppler imaging. Profunda Femoral Vein: No evidence of thrombus. Normal compressibility and flow on color Doppler imaging. Femoral Vein: No evidence of thrombus. Normal compressibility, respiratory phasicity and response to augmentation. Popliteal Vein: No evidence of thrombus. Normal compressibility, respiratory phasicity and response to augmentation. Calf Veins: No evidence of thrombus. Normal compressibility and flow on color Doppler imaging. Superficial Great Saphenous Vein: No evidence of thrombus. Normal compressibility. Venous Reflux:  None. Other Findings:  None. IMPRESSION: 1. No evidence  of deep venous thrombosis in either lower extremity. 2. Chronic right Baker cyst containing calcified loose bodies. Electronically Signed   By: Keith Rake M.D.   On: 01/25/2019 22:19      ____________________________________________  PROCEDURES  Procedure(s) performed: None Procedures Critical Care performed:  None ____________________________________________   INITIAL IMPRESSION / ASSESSMENT AND PLAN / ED COURSE  84 y.o. female with history of anemia, asthma, CAD, hypertension, hyperlipidemia, hypothyroidism who presents for evaluation of bilateral leg swelling.  Patient with nonpitting bilateral lower extremity swelling for 4 days.  Normal labs 4 days ago including thyroid studies done at PCPs office.  Currently on daily Lasix with no relief.  No evidence of cellulitis, ischemic limb.  Doppler studies pending. Labs and CXR pending.  Clinical Course as of Jan 24 2314  Sat Jan 25, 2019  2246 Venous Dopplers negative for DVT.  BNP within normal limits however chest x-ray did show mild cardiomegaly with mild pulmonary edema concerning for possible new heart failure.  Patient has no chest pain or shortness of breath, she has normal work of breathing, she ambulated in the emergency room with no hypoxia, tachypnea, or shortness of breath.  Will refer patient to cardiology as an outpatient for further evaluation.  She is already on Lasix, recommended that she continue with her current dose which was recently started.  Discussed compression stockings and elevation of her legs.  Discussed strict return precautions for chest pain or shortness of breath.   [CV]    Clinical Course User Index [CV] Alfred Levins [place], MD    I discussed the findings, follow-up plan, return precautions, and home plan with patient and her daughter over the phone.  Family is comfortable with this plan and will make sure she follows up with cardiology on Monday.  Should be discharged to the care of her family    As part of my medical decision making, I reviewed the following data within the [place] notes reviewed and incorporated, Labs reviewed , EKG interpreted , Old chart reviewed, Radiograph reviewed , Notes from prior ED visits and [place] Controlled Substance Database    Pertinent labs & imaging results that were available during my care of the patient were reviewed by me and considered in my medical decision making (see chart for details).    ____________________________________________   FINAL CLINICAL IMPRESSION(S) / ED DIAGNOSES  Final diagnoses:  New onset of congestive heart failure ([place])      NEW MEDICATIONS STARTED DURING THIS VISIT:  ED Discharge Orders    None       Note:  This document was prepared using Dragon voice recognition software and may include unintentional dictation errors.    Alfred Levins, [place], MD 01/25/19 [phone]

## 2019-01-27 ENCOUNTER — Telehealth: Payer: Self-pay

## 2019-01-27 NOTE — Telephone Encounter (Signed)
[facility] Patient Name: Brianna Woods Gender: Female DOB: 05-30-1935 Age: 84 Y 55 M 18 D Return Phone Number: [phone] (Primary) Address: City/State/Zip: [place] [address] Client [place] Night - Client Client Site [facility] Physician Ria Bush - MD Contact Type Call Who Is Calling Patient / Member / Family / Caregiver Call Type Triage / Clinical Relationship To Patient Self Return Phone Number [phone] (Primary) Chief Complaint Leg Swelling And Edema Reason for Call Symptomatic / Request for [place] states she has swelling in her legs and wants to get something for it. Translation No Nurse Assessment Nurse: Micki Riley, RN, Domenick Gong Date/Time (Eastern Time): 01/25/2019 6:01:00 PM Confirm and document reason for call. If symptomatic, describe symptoms. ---Caller states she has swelling in her legs and wants to get something for it. Saw Md on Tuesday, medication was not increased then. Both legs swelling, above the knee. Has the patient had close contact with a person known or suspected to have the novel coronavirus illness OR traveled / lives in area with major community spread (including international travel) in the last 14 days from the onset of symptoms? * If Asymptomatic, screen for exposure and travel within the last 14 days. ---No Does the patient have any new or worsening symptoms? ---Yes Will a triage be completed? ---Yes Related visit to physician within the last 2 weeks? ---Yes Does the PT have any chronic conditions? (i.e. diabetes, asthma, this includes High risk factors for pregnancy, etc.) ---Yes List chronic conditions. ---Controlled asthma. States swelling is new. Is this a behavioral health or substance abuse call? ---No Guidelines Guideline Title Affirmed  Question Affirmed Notes Nurse Date/Time Eilene Ghazi Time) Leg Swelling and Edema SEVERE leg swelling (e.g., swelling extends above knee, entire leg is swollen, weeping fluid) Cazares, RN, Domenick Gong 01/25/2019 6:03:52 PM Disp. Time Eilene Ghazi Time) Disposition Final User 01/25/2019 6:18:47 PM Called On-Call Provider Cazares, RN, Domenick Gong PLEASE NOTE: All timestamps contained within this report are represented as [place] Standard Time. CONFIDENTIALTY NOTICE: This fax transmission is intended only for the addressee. It contains information that is legally privileged, confidential or otherwise protected from use or disclosure. If you are not the intended recipient, you are strictly prohibited from reviewing, disclosing, copying using or disseminating any of this information or taking any action in reliance on or regarding this information. If you have received this fax in error, please notify us immediately by telephone so that we can arrange for its return to [place]. Phone: [phone], Toll-Free: [phone], Fax: [phone] Page: 2 of 2 Call Id: [id number] 01/25/2019 6:16:11 PM See HCP within 4 Hours (or PCP triage) Yes Micki Riley, RN, [place] Disagree/Comply Comply Caller Understands Yes PreDisposition Call Doctor Care Advice Given Per Guideline SEE HCP WITHIN 4 HOURS (OR PCP TRIAGE): * IF OFFICE WILL BE CLOSED AND PCP SECOND-LEVEL TRIAGE REQUIRED: You may need to be seen. Your doctor (or NP/PA) will want to talk with you to decide what's best. I'll page the oncall provider now. If you haven't heard from the provider (or me) within 30 minutes, call again. NOTE: If on-call provider can't be reached, send to [facility] or ED. CALL BACK IF: * You become worse. CARE ADVICE given per Leg Swelling and Edema (Adult) guideline. Comments User: Magnus Sinning, RN Date/Time Eilene Ghazi Time): 01/25/2019 6:25:37 PM Upgraded to ED now as Md requested patient be  evaluated in ED.  Patient to call son for transportation. Referrals GO TO FACILITY UNDECIDED [facility] - ED Paging DoctorName Phone DateTime Result/Outcome Message Type Notes Grier Mitts- MD [phone] 01/25/2019 6:18:47 PM Called On Call Provider - Reached Doctor Paged Grier Mitts- MD 01/25/2019 6:21:23 PM Spoke with On Call - General Message Result Notified Md on call regarding disposition. Md agrees for patient to go to ED to be evaluated regarding increased bilateral lower extremity swelling.

## 2019-01-27 NOTE — Telephone Encounter (Signed)
Please refer to 01/25/19 ER encounter for details.  Patient is to follow up with cardiology in 2 weeks.

## 2019-01-28 DIAGNOSIS — I10 Essential (primary) hypertension: Secondary | ICD-10-CM | POA: Diagnosis not present

## 2019-01-28 DIAGNOSIS — E782 Mixed hyperlipidemia: Secondary | ICD-10-CM | POA: Diagnosis not present

## 2019-01-28 DIAGNOSIS — R600 Localized edema: Secondary | ICD-10-CM | POA: Diagnosis not present

## 2019-01-28 DIAGNOSIS — I5021 Acute systolic (congestive) heart failure: Secondary | ICD-10-CM | POA: Diagnosis not present

## 2019-01-28 MED ORDER — LEVOTHYROXINE SODIUM 25 MCG PO TABS: 25.0000 ug | ORAL_TABLET | Freq: Every day | ORAL | 3 refills | Status: DC

## 2019-01-28 NOTE — Telephone Encounter (Addendum)
Yes do want her to to start levothyroxine at lower dose 93mcg daily - sent to local pharmacy.  Keep cards appt.

## 2019-01-28 NOTE — Telephone Encounter (Signed)
Attempted to contact pt.  No answer.  No vm.  Need to relay Dr. G's message.  

## 2019-01-28 NOTE — Addendum Note (Signed)
Addended by: Ria Bush on: 01/28/2019 01:40 PM   Modules accepted: Orders

## 2019-01-28 NOTE — Telephone Encounter (Signed)
Noted. plz notify patient of lab results - I think low thyroid is contributing to swelling. Has she started thyroid medicine?

## 2019-01-28 NOTE — Telephone Encounter (Signed)
Spoke with Brianna Woods relaying results and message per Dr. Darnell Level.  Brianna Woods verbalizes understanding. However, states she was seen at [facility] ED on 01/25/19, dx CHF.  Brianna Woods states she has not started thyroid med yet.  Asking if Dr. Darnell Level still wants her to take it despite ED visit.

## 2019-01-29 NOTE — Telephone Encounter (Signed)
Spoke with pt relaying Dr. G's message. Pt verbalizes understanding.  

## 2019-02-05 ENCOUNTER — Other Ambulatory Visit: Payer: Self-pay | Admitting: Family Medicine

## 2019-02-07 DIAGNOSIS — I5021 Acute systolic (congestive) heart failure: Secondary | ICD-10-CM | POA: Diagnosis not present

## 2019-02-11 ENCOUNTER — Ambulatory Visit (INDEPENDENT_AMBULATORY_CARE_PROVIDER_SITE_OTHER): Payer: Medicare PPO | Admitting: Family Medicine

## 2019-02-11 ENCOUNTER — Encounter: Payer: Self-pay | Admitting: Family Medicine

## 2019-02-11 VITALS — Temp 98.8°F | Ht 59.5 in | Wt 173.0 lb

## 2019-02-11 DIAGNOSIS — R600 Localized edema: Secondary | ICD-10-CM | POA: Diagnosis not present

## 2019-02-11 DIAGNOSIS — M545 Low back pain, unspecified: Secondary | ICD-10-CM | POA: Insufficient documentation

## 2019-02-11 DIAGNOSIS — M5442 Lumbago with sciatica, left side: Secondary | ICD-10-CM | POA: Diagnosis not present

## 2019-02-11 DIAGNOSIS — E039 Hypothyroidism, unspecified: Secondary | ICD-10-CM

## 2019-02-11 DIAGNOSIS — M5441 Lumbago with sciatica, right side: Secondary | ICD-10-CM

## 2019-02-11 MED ORDER — TRAMADOL HCL 50 MG PO TABS: 25.0000 mg | ORAL_TABLET | Freq: Three times a day (TID) | ORAL | 0 refills | Status: DC | PRN

## 2019-02-11 NOTE — Assessment & Plan Note (Signed)
Saw cards, reassuring echo pointing against CHF as cause. Likely multifactorial including med related, doing better on lower amlodipine and after starting levothyroxine.

## 2019-02-11 NOTE — Assessment & Plan Note (Addendum)
No red flags. Limited eval through phone call but describes sciatica R>L. Offered increased gabapentin dose vs short tramadol course as well as recommended gentle stretching. Pt desires trial of tramadol, aware of sedation precautions and increased unsteadiness as possible side effect. Continue tylenol, heating pad. Will reassess at 2 wk f/u visit.  [place] CSRS reviewed.

## 2019-02-11 NOTE — Progress Notes (Signed)
Brianna Woods - 84 y.o. female  MRN [id number]  Date of Birth: [birth year]  PCP: Ria Bush, MD  This service was provided via telemedicine. Phone Visit performed on 02/11/2019    Rationale for phone visit along with limitations reviewed. Patient consented to telephone encounter.    Location of patient: at home Location of provider: in office, [place] @ [facility] Name of referring provider: N/A   Names of persons and role in encounter: Provider: Ria Bush, MD  Patient: Brianna Woods  Other: N/A   Time on call: 10:13am - 10:25am   Subjective: Chief Complaint  Patient presents with  . Back Pain    C/o of low back pain radiating into hips. Started about 2 wks ago. Tried OTC pain meds and topical.      HPI:  2 wk h/o lower back with radiation into hips L>R. Started the night she went to ER, progressively worsening despite heating pad, voltaren gel, tylenol. No shooting pain down legs, no numbness or weakness of legs. No new bowel/bladder incontinence or fevers/chills. No dysuria. Denies inciting trauma/injury or fall. Continues regular walker use. Feels better when laying down and on left side, worse with prolonged standing/walking.   We recently started levothyroxine for newly noted hypothyroidism (in h/o thyroid disease)  Recently established with cardiology (Dr Nehemiah Massed) for leg edema - ?acute systolic CHF, but echocardiogram showing normal left and right systolic function with mod LVH, mild valvular regurgitation (AR, MR, TR, PR) without stenosis, EF 55-60%. Continues lasix. Leg swelling is slowly improving.   Objective/Observations:   No physical exam or vital signs collected unless specifically identified below.   Temp 98.8 F (37.1 C) (Oral)   Ht 4' 11.5" (1.511 m)   Wt 173 lb (78.5 kg)   BMI 34.36 kg/m    Respiratory status: speaks in complete sentences without evident shortness of breath.   Lab Results  Component Value Date   CREATININE 1.01 (H)  01/25/2019   BUN 14 01/25/2019   NA 140 01/25/2019   K 3.7 01/25/2019   CL 104 01/25/2019   CO2 28 01/25/2019    Assessment/Plan:  Pedal edema Saw cards, reassuring echo pointing against CHF as cause. Likely multifactorial including med related, doing better on lower amlodipine and after starting levothyroxine.   Low back pain No red flags. Limited eval through phone call but describes sciatica R>L. Offered increased gabapentin dose vs short tramadol course as well as recommended gentle stretching. Pt desires trial of tramadol, aware of sedation precautions and increased unsteadiness as possible side effect. Continue tylenol, heating pad. Will reassess at 2 wk f/u visit.  [place] CSRS reviewed.   Acquired hypothyroidism TFTs revealing hypothyroidism - started on 16mcg levothyroxine. Will reassess with labs at f/u visit later this month.   I discussed the assessment and treatment plan with the patient. The patient was provided an opportunity to ask questions and all were answered. The patient agreed with the plan and demonstrated an understanding of the instructions.  Lab Orders  No laboratory test(s) ordered today    Meds ordered this encounter  Medications  . traMADol (ULTRAM) 50 MG tablet    Sig: Take 0.5-1 tablets (25-50 mg total) by mouth every 8 (eight) hours as needed for up to 5 days for moderate pain.    Dispense:  15 tablet    Refill:  0    The patient was advised to call back or seek an in-person evaluation if the symptoms worsen or if the condition  fails to improve as anticipated.  Ria Bush, MD

## 2019-02-11 NOTE — Assessment & Plan Note (Signed)
TFTs revealing hypothyroidism - started on 60mcg levothyroxine. Will reassess with labs at f/u visit later this month.

## 2019-02-13 DIAGNOSIS — I10 Essential (primary) hypertension: Secondary | ICD-10-CM | POA: Diagnosis not present

## 2019-02-13 DIAGNOSIS — I5021 Acute systolic (congestive) heart failure: Secondary | ICD-10-CM | POA: Diagnosis not present

## 2019-02-13 DIAGNOSIS — R600 Localized edema: Secondary | ICD-10-CM | POA: Diagnosis not present

## 2019-02-24 ENCOUNTER — Ambulatory Visit (INDEPENDENT_AMBULATORY_CARE_PROVIDER_SITE_OTHER): Payer: Medicare PPO | Admitting: Family Medicine

## 2019-02-24 ENCOUNTER — Encounter: Payer: Self-pay | Admitting: Family Medicine

## 2019-02-24 DIAGNOSIS — I700 Atherosclerosis of aorta: Secondary | ICD-10-CM

## 2019-02-24 DIAGNOSIS — I10 Essential (primary) hypertension: Secondary | ICD-10-CM

## 2019-02-24 DIAGNOSIS — R600 Localized edema: Secondary | ICD-10-CM | POA: Diagnosis not present

## 2019-02-24 DIAGNOSIS — E039 Hypothyroidism, unspecified: Secondary | ICD-10-CM

## 2019-02-24 MED ORDER — GABAPENTIN 300 MG PO CAPS: 300.0000 mg | ORAL_CAPSULE | Freq: Three times a day (TID) | ORAL | 1 refills | Status: DC

## 2019-02-24 NOTE — Assessment & Plan Note (Addendum)
Ongoing, managing with lasix 20mg  daily.  Unclear if she has sCHF component as recent echocardiogram showed EF 55-60% - suggested she clarify with cardiology.

## 2019-02-24 NOTE — Assessment & Plan Note (Signed)
BP stable on current regimen. Discussed beta blocker treatment - advised either take metoprolol or carvedilol. She recently stopped carvedilol worried it was contributing to pedal edema. I don't think it is - so suggested she retrial carvedilol.

## 2019-02-24 NOTE — Assessment & Plan Note (Signed)
Tolerating 22mcg levothyroxine.  I have asked her call back and schedule 2 wk lab visit TFT check.

## 2019-02-24 NOTE — Progress Notes (Signed)
   Brianna Woods - 84 y.o. female  MRN [id number]  Date of Birth: [birth year]  PCP: Ria Bush, MD  This service was provided via telemedicine. Phone Visit performed on 02/24/2019    Rationale for phone visit along with limitations reviewed. Patient consented to telephone encounter.    Location of patient: at home Location of provider: in office, [place] @ [facility] Name of referring provider: N/A  Names of persons and role in encounter: Provider: Ria Bush, MD  Patient: Brianna Woods  Other: N/A  Time on call: 12:11pm - 12:20pm  Subjective: Chief Complaint  Patient presents with  . Follow-up    3-4 mo f/u.    HPI:  Recently established with Dr Nehemiah Massed cardiology for [facility] with resultant med changes including starting furosemide and starting carvedilol 3.125mg  bid. She may not return to cards.   She has been taking clonidine 0.1mg  bid along with amlodipine 5mg  daily, and metoprolol 25mg  bid.   Overall feeling well. Back pain has improved.  Legs started swelling again on Saturday, so she stopped carvedilol. She continues taking furosemide 20mg  daily.   We recently started levothyroxine for newly noted hypothyroidism (in h/o thyroid disease).    Objective/Observations:  No physical exam or vital signs collected unless specifically identified below.   BP 129/87   Pulse 80   Temp 98.6 F (37 C) (Oral)   Ht 4' 11.5" (1.511 m)   Wt 187 lb (84.8 kg)   BMI 37.14 kg/m    Respiratory status: speaks in complete sentences without evident shortness of breath.   Assessment/Plan: I asked her to call back and schedule lab visit at end of month as well as 3 mo f/u visit.   Acquired hypothyroidism Tolerating 32mcg levothyroxine.  I have asked her call back and schedule 2 wk lab visit TFT check.   Atherosclerosis of abdominal aorta (HCC) Continues aspirin, statin.   HTN (hypertension) BP stable on current regimen. Discussed beta blocker treatment - advised  either take metoprolol or carvedilol. She recently stopped carvedilol worried it was contributing to pedal edema. I don't think it is - so suggested she retrial carvedilol.   Pedal edema Ongoing, managing with lasix 20mg  daily.  Unclear if she has sCHF component as recent echocardiogram showed EF 55-60% - suggested she clarify with cardiology.   I discussed the assessment and treatment plan with the patient. The patient was provided an opportunity to ask questions and all were answered. The patient agreed with the plan and demonstrated an understanding of the instructions.  Lab Orders  No laboratory test(s) ordered today    Meds ordered this encounter  Medications  . gabapentin (NEURONTIN) 300 MG capsule    Sig: Take 1 capsule (300 mg total) by mouth 3 (three) times daily.    Dispense:  270 capsule    Refill:  1    Note new sig    The patient was advised to call back or seek an in-person evaluation if the symptoms worsen or if the condition fails to improve as anticipated.  Ria Bush, MD

## 2019-02-24 NOTE — Assessment & Plan Note (Signed)
Continues aspirin, statin.  

## 2019-02-27 ENCOUNTER — Other Ambulatory Visit: Payer: Self-pay | Admitting: Family Medicine

## 2019-02-27 NOTE — Telephone Encounter (Signed)
Name of Medication: Tramadol Name of Pharmacy: [facility] Last Fill or Written Date and Quantity: 02/11/19, #15 Last Office Visit and Type: 02/24/19, f/u Next Office Visit and Type: 05/29/19, 3 mo f/u Last Controlled Substance Agreement Date: none Last UDS: none

## 2019-02-28 NOTE — Telephone Encounter (Signed)
Eprescribed.

## 2019-03-17 ENCOUNTER — Other Ambulatory Visit: Payer: Self-pay | Admitting: Family Medicine

## 2019-03-17 ENCOUNTER — Other Ambulatory Visit: Payer: Medicare PPO

## 2019-03-17 DIAGNOSIS — E785 Hyperlipidemia, unspecified: Secondary | ICD-10-CM

## 2019-03-17 DIAGNOSIS — I10 Essential (primary) hypertension: Secondary | ICD-10-CM

## 2019-03-17 DIAGNOSIS — E559 Vitamin D deficiency, unspecified: Secondary | ICD-10-CM

## 2019-03-17 DIAGNOSIS — E039 Hypothyroidism, unspecified: Secondary | ICD-10-CM

## 2019-03-19 ENCOUNTER — Other Ambulatory Visit: Payer: Self-pay | Admitting: Family Medicine

## 2019-03-20 DIAGNOSIS — I10 Essential (primary) hypertension: Secondary | ICD-10-CM | POA: Diagnosis not present

## 2019-03-20 DIAGNOSIS — I5032 Chronic diastolic (congestive) heart failure: Secondary | ICD-10-CM | POA: Diagnosis not present

## 2019-03-20 DIAGNOSIS — R600 Localized edema: Secondary | ICD-10-CM | POA: Diagnosis not present

## 2019-03-20 DIAGNOSIS — E782 Mixed hyperlipidemia: Secondary | ICD-10-CM | POA: Diagnosis not present

## 2019-05-08 ENCOUNTER — Other Ambulatory Visit: Payer: Self-pay | Admitting: Family Medicine

## 2019-05-09 NOTE — Telephone Encounter (Signed)
Needs lab appointment, overdue. Patient did not show up for her lab appointment in July

## 2019-05-09 NOTE — Telephone Encounter (Signed)
Spoke with pt discussing lab visit.  Pt states she just picked up new levothyroxine 50 mcg.  Says she will start.  Therefore, lab visit scheduled for 6 wks on 06/20/19 at 9:00.   Denied 25 mcg refill request due to dose change.

## 2019-05-10 ENCOUNTER — Other Ambulatory Visit: Payer: Self-pay | Admitting: Family Medicine

## 2019-05-12 NOTE — Telephone Encounter (Signed)
Last filled on 01/11/2019 for #90 with 0 refills. LOV 02/24/2019

## 2019-05-13 NOTE — Telephone Encounter (Signed)
Sent. Thanks.   

## 2019-05-15 ENCOUNTER — Other Ambulatory Visit: Payer: Medicare PPO

## 2019-05-21 ENCOUNTER — Other Ambulatory Visit: Payer: Self-pay | Admitting: Family Medicine

## 2019-05-21 NOTE — Telephone Encounter (Signed)
E-scribed refills.  Pls schedule wellness visit and labs.

## 2019-05-22 NOTE — Telephone Encounter (Signed)
PT is scheduled for a 3 month follow up on 05/29/19 and labs on 06/20/19. We will wait until after her appt next week before scheduling AWV.

## 2019-05-28 ENCOUNTER — Other Ambulatory Visit: Payer: Self-pay | Admitting: Family Medicine

## 2019-05-29 ENCOUNTER — Ambulatory Visit: Payer: Medicare PPO | Admitting: Family Medicine

## 2019-06-20 ENCOUNTER — Other Ambulatory Visit: Payer: Medicare PPO

## 2019-07-21 ENCOUNTER — Other Ambulatory Visit: Payer: Self-pay | Admitting: Family Medicine

## 2019-07-22 NOTE — Telephone Encounter (Signed)
Last filled by Dr Damita Dunnings on 05/13/2019. AWV and labs have been scheduled for December. Please review. Thank you

## 2019-07-22 NOTE — Telephone Encounter (Signed)
Waiting for AWV and labs appointment to be scheduled before refilling.

## 2019-07-22 NOTE — Telephone Encounter (Deleted)
Electronic refill request. Trazodone Last office visit:

## 2019-07-22 NOTE — Telephone Encounter (Signed)
Pls schedule wellness visit and labs and then will review refills request. Thank you

## 2019-07-22 NOTE — Telephone Encounter (Signed)
Pt is scheduled for AWV 09/03/19 with wellness nurse and 2 Part Annual with labs on 09/10/19. Pt has a hard time finding rides. Mailed pt appointment information.

## 2019-07-29 ENCOUNTER — Other Ambulatory Visit: Payer: Self-pay | Admitting: Family Medicine

## 2019-07-30 ENCOUNTER — Other Ambulatory Visit: Payer: Self-pay

## 2019-07-30 MED ORDER — SERTRALINE HCL 50 MG PO TABS: 50.0000 mg | ORAL_TABLET | Freq: Every day | ORAL | 0 refills | Status: DC

## 2019-07-30 NOTE — Telephone Encounter (Signed)
E-scribed refill 

## 2019-08-31 ENCOUNTER — Other Ambulatory Visit: Payer: Self-pay | Admitting: Family Medicine

## 2019-09-01 ENCOUNTER — Other Ambulatory Visit: Payer: Self-pay | Admitting: Family Medicine

## 2019-09-03 ENCOUNTER — Ambulatory Visit: Payer: Medicare PPO

## 2019-09-10 ENCOUNTER — Encounter: Payer: Medicare PPO | Admitting: Family Medicine

## 2019-09-16 ENCOUNTER — Ambulatory Visit: Payer: Medicare PPO

## 2019-09-22 ENCOUNTER — Encounter: Payer: Medicare PPO | Admitting: Family Medicine

## 2019-09-22 DIAGNOSIS — Z0289 Encounter for other administrative examinations: Secondary | ICD-10-CM

## 2019-09-24 ENCOUNTER — Telehealth: Payer: Self-pay | Admitting: Family Medicine

## 2019-09-24 ENCOUNTER — Other Ambulatory Visit: Payer: Self-pay | Admitting: Family Medicine

## 2019-09-24 NOTE — Telephone Encounter (Signed)
Lvm asking pt to call back to r/s missed 09/22/19 physical.

## 2019-09-24 NOTE — Telephone Encounter (Signed)
Pt missed appt on Monday which is not like her - plz call for update and to reschedule appt as due for f/u or physical whichever she prefers.

## 2019-09-25 DIAGNOSIS — M25571 Pain in right ankle and joints of right foot: Secondary | ICD-10-CM | POA: Diagnosis not present

## 2019-09-25 DIAGNOSIS — M19012 Primary osteoarthritis, left shoulder: Secondary | ICD-10-CM | POA: Diagnosis not present

## 2019-09-25 DIAGNOSIS — M1712 Unilateral primary osteoarthritis, left knee: Secondary | ICD-10-CM | POA: Diagnosis not present

## 2019-09-25 DIAGNOSIS — M2352 Chronic instability of knee, left knee: Secondary | ICD-10-CM | POA: Diagnosis not present

## 2019-09-25 DIAGNOSIS — M19071 Primary osteoarthritis, right ankle and foot: Secondary | ICD-10-CM | POA: Diagnosis not present

## 2019-09-25 DIAGNOSIS — M25512 Pain in left shoulder: Secondary | ICD-10-CM | POA: Diagnosis not present

## 2019-09-25 NOTE — Telephone Encounter (Signed)
Lvm asking pt to call back to r/s missed 09/22/19 physical.

## 2019-09-26 ENCOUNTER — Other Ambulatory Visit: Payer: Self-pay | Admitting: Family Medicine

## 2019-09-26 NOTE — Telephone Encounter (Signed)
Lvm asking pt to call back to r/s missed 09/22/19 physical.  Mailing a letter.

## 2019-10-09 ENCOUNTER — Telehealth: Payer: Self-pay

## 2019-10-09 NOTE — Telephone Encounter (Signed)
She last saw [place] in June and missed CPE 09/2019. These changes are a major change for her - yes - plz schedule in office visit for further evaluation to try to find out what happened since the summer to cause sudden deterioration.  Jeneen Rinks and Efrain are on [employer], not sure about Marya Amsler.

## 2019-10-09 NOTE — Telephone Encounter (Signed)
Spoke with pt's son, Ephrain "Marya Amsler" (on dpr), informing him Dr. Darnell Level wants to see pt.  OV scheduled for 10/13/19 at 8:45.  Marya Amsler will accompany pt.

## 2019-10-09 NOTE — Telephone Encounter (Signed)
Pt's oldest son, Marya Amsler, called stating that he has taken the pt to his home after going to visit her recently. He stated she was not bathing, had urine a feces everywhere.   He has friends coming over daily to help him with her while he is at work. He is wanting to put her in an assisted living/skilled nursing facility.   He is asking if he needs to bring her in to be seen? He was asking for a referral to a place and I advised him that usually the family finds the facility and we do the [place] form. But, since she has not been seen in quite awhile, an OV may be needed.   Please call Marya Amsler and advise what he needs to do [phone]

## 2019-10-13 ENCOUNTER — Emergency Department
Admission: EM | Admit: 2019-10-13 | Discharge: 2019-10-16 | Disposition: A | Discharge status: Skilled Nursing Facility | Payer: Medicare HMO | Attending: Emergency Medicine | Admitting: Emergency Medicine

## 2019-10-13 ENCOUNTER — Encounter: Payer: Self-pay | Admitting: Family Medicine

## 2019-10-13 ENCOUNTER — Encounter: Payer: Self-pay | Admitting: Emergency Medicine

## 2019-10-13 ENCOUNTER — Emergency Department: Payer: Medicare HMO

## 2019-10-13 ENCOUNTER — Other Ambulatory Visit: Payer: Self-pay

## 2019-10-13 ENCOUNTER — Ambulatory Visit (INDEPENDENT_AMBULATORY_CARE_PROVIDER_SITE_OTHER): Payer: Medicare HMO | Admitting: Family Medicine

## 2019-10-13 VITALS — BP 138/82 | HR 79 | Temp 97.2°F

## 2019-10-13 DIAGNOSIS — R531 Weakness: Secondary | ICD-10-CM | POA: Insufficient documentation

## 2019-10-13 DIAGNOSIS — E039 Hypothyroidism, unspecified: Secondary | ICD-10-CM

## 2019-10-13 DIAGNOSIS — I10 Essential (primary) hypertension: Secondary | ICD-10-CM

## 2019-10-13 DIAGNOSIS — M797 Fibromyalgia: Secondary | ICD-10-CM | POA: Insufficient documentation

## 2019-10-13 DIAGNOSIS — R4182 Altered mental status, unspecified: Secondary | ICD-10-CM | POA: Insufficient documentation

## 2019-10-13 DIAGNOSIS — I119 Hypertensive heart disease without heart failure: Secondary | ICD-10-CM | POA: Diagnosis not present

## 2019-10-13 DIAGNOSIS — F0390 Unspecified dementia without behavioral disturbance: Secondary | ICD-10-CM

## 2019-10-13 DIAGNOSIS — N3946 Mixed incontinence: Secondary | ICD-10-CM | POA: Diagnosis not present

## 2019-10-13 DIAGNOSIS — I700 Atherosclerosis of aorta: Secondary | ICD-10-CM

## 2019-10-13 DIAGNOSIS — F321 Major depressive disorder, single episode, moderate: Secondary | ICD-10-CM

## 2019-10-13 DIAGNOSIS — R404 Transient alteration of awareness: Secondary | ICD-10-CM | POA: Diagnosis not present

## 2019-10-13 DIAGNOSIS — K2100 Gastro-esophageal reflux disease with esophagitis, without bleeding: Secondary | ICD-10-CM | POA: Diagnosis not present

## 2019-10-13 DIAGNOSIS — E049 Nontoxic goiter, unspecified: Secondary | ICD-10-CM | POA: Diagnosis not present

## 2019-10-13 DIAGNOSIS — I2510 Atherosclerotic heart disease of native coronary artery without angina pectoris: Secondary | ICD-10-CM | POA: Insufficient documentation

## 2019-10-13 DIAGNOSIS — R400 Somnolence: Secondary | ICD-10-CM

## 2019-10-13 DIAGNOSIS — R0902 Hypoxemia: Secondary | ICD-10-CM | POA: Diagnosis not present

## 2019-10-13 DIAGNOSIS — R410 Disorientation, unspecified: Secondary | ICD-10-CM | POA: Diagnosis not present

## 2019-10-13 DIAGNOSIS — Z8673 Personal history of transient ischemic attack (TIA), and cerebral infarction without residual deficits: Secondary | ICD-10-CM | POA: Insufficient documentation

## 2019-10-13 DIAGNOSIS — G9340 Encephalopathy, unspecified: Secondary | ICD-10-CM | POA: Diagnosis not present

## 2019-10-13 DIAGNOSIS — Z20822 Contact with and (suspected) exposure to covid-19: Secondary | ICD-10-CM | POA: Diagnosis not present

## 2019-10-13 DIAGNOSIS — R339 Retention of urine, unspecified: Secondary | ICD-10-CM | POA: Diagnosis not present

## 2019-10-13 DIAGNOSIS — K219 Gastro-esophageal reflux disease without esophagitis: Secondary | ICD-10-CM | POA: Insufficient documentation

## 2019-10-13 DIAGNOSIS — R338 Other retention of urine: Secondary | ICD-10-CM

## 2019-10-13 DIAGNOSIS — R32 Unspecified urinary incontinence: Secondary | ICD-10-CM

## 2019-10-13 DIAGNOSIS — R000 Tachycardia, unspecified: Secondary | ICD-10-CM | POA: Diagnosis not present

## 2019-10-13 LAB — COMPREHENSIVE METABOLIC PANEL
ALT: 23 U/L (ref 0–44)
AST: 28 U/L (ref 15–41)
Albumin: 3.5 g/dL (ref 3.5–5.0)
Alkaline Phosphatase: 57 U/L (ref 38–126)
Anion gap: 11 (ref 5–15)
BUN: 15 mg/dL (ref 8–23)
CO2: 29 mmol/L (ref 22–32)
Calcium: 9.2 mg/dL (ref 8.9–10.3)
Chloride: 103 mmol/L (ref 98–111)
Creatinine, Ser: 1.3 mg/dL — ABNORMAL HIGH (ref 0.44–1.00)
GFR calc Af Amer: 44 mL/min — ABNORMAL LOW (ref >60)
GFR calc non Af Amer: 38 mL/min — ABNORMAL LOW (ref >60)
Glucose, Bld: 94 mg/dL (ref 70–99)
Potassium: 3.5 mmol/L (ref 3.5–5.1)
Sodium: 143 mmol/L (ref 135–145)
Total Bilirubin: 0.6 mg/dL (ref 0.3–1.2)
Total Protein: 6.7 g/dL (ref 6.5–8.1)

## 2019-10-13 LAB — URINALYSIS, COMPLETE (UACMP) WITH MICROSCOPIC
Bilirubin Urine: NEGATIVE
Glucose, UA: NEGATIVE mg/dL
Hgb urine dipstick: NEGATIVE
Ketones, ur: NEGATIVE mg/dL
Nitrite: NEGATIVE
Protein, ur: NEGATIVE mg/dL
Specific Gravity, Urine: 1.006 (ref 1.005–1.030)
Squamous Epithelial / HPF: NONE SEEN (ref 0–5)
pH: 7 (ref 5.0–8.0)

## 2019-10-13 LAB — CBC WITH DIFFERENTIAL/PLATELET
Abs Immature Granulocytes: 0.08 10*3/uL — ABNORMAL HIGH (ref 0.00–0.07)
Basophils Absolute: 0 10*3/uL (ref 0.0–0.1)
Basophils Relative: 0 %
Eosinophils Absolute: 0 10*3/uL (ref 0.0–0.5)
Eosinophils Relative: 0 %
HCT: 31.5 % — ABNORMAL LOW (ref 36.0–46.0)
Hemoglobin: 9.7 g/dL — ABNORMAL LOW (ref 12.0–15.0)
Immature Granulocytes: 1 %
Lymphocytes Relative: 14 %
Lymphs Abs: 1.6 10*3/uL (ref 0.7–4.0)
MCH: 28.5 pg (ref 26.0–34.0)
MCHC: 30.8 g/dL (ref 30.0–36.0)
MCV: 92.6 fL (ref 80.0–100.0)
Monocytes Absolute: 0.9 10*3/uL (ref 0.1–1.0)
Monocytes Relative: 8 %
Neutro Abs: 8.7 10*3/uL — ABNORMAL HIGH (ref 1.7–7.7)
Neutrophils Relative %: 77 %
Platelets: 314 10*3/uL (ref 150–400)
RBC: 3.4 MIL/uL — ABNORMAL LOW (ref 3.87–5.11)
RDW: 17 % — ABNORMAL HIGH (ref 11.5–15.5)
WBC: 11.3 10*3/uL — ABNORMAL HIGH (ref 4.0–10.5)
nRBC: 0 % (ref 0.0–0.2)

## 2019-10-13 LAB — TROPONIN I (HIGH SENSITIVITY)
Troponin I (High Sensitivity): 24 ng/L — ABNORMAL HIGH (ref <18)
Troponin I (High Sensitivity): 18 ng/L — ABNORMAL HIGH (ref <18)

## 2019-10-13 IMAGING — DX DG CHEST 1V
1 series · 1 of 1 positions shown · non-contrast
Comparison: [DATE]

CLINICAL DATA: Bilateral leg weakness.  Mental status change

EXAM:
CHEST  1 VIEW

[chest ap]
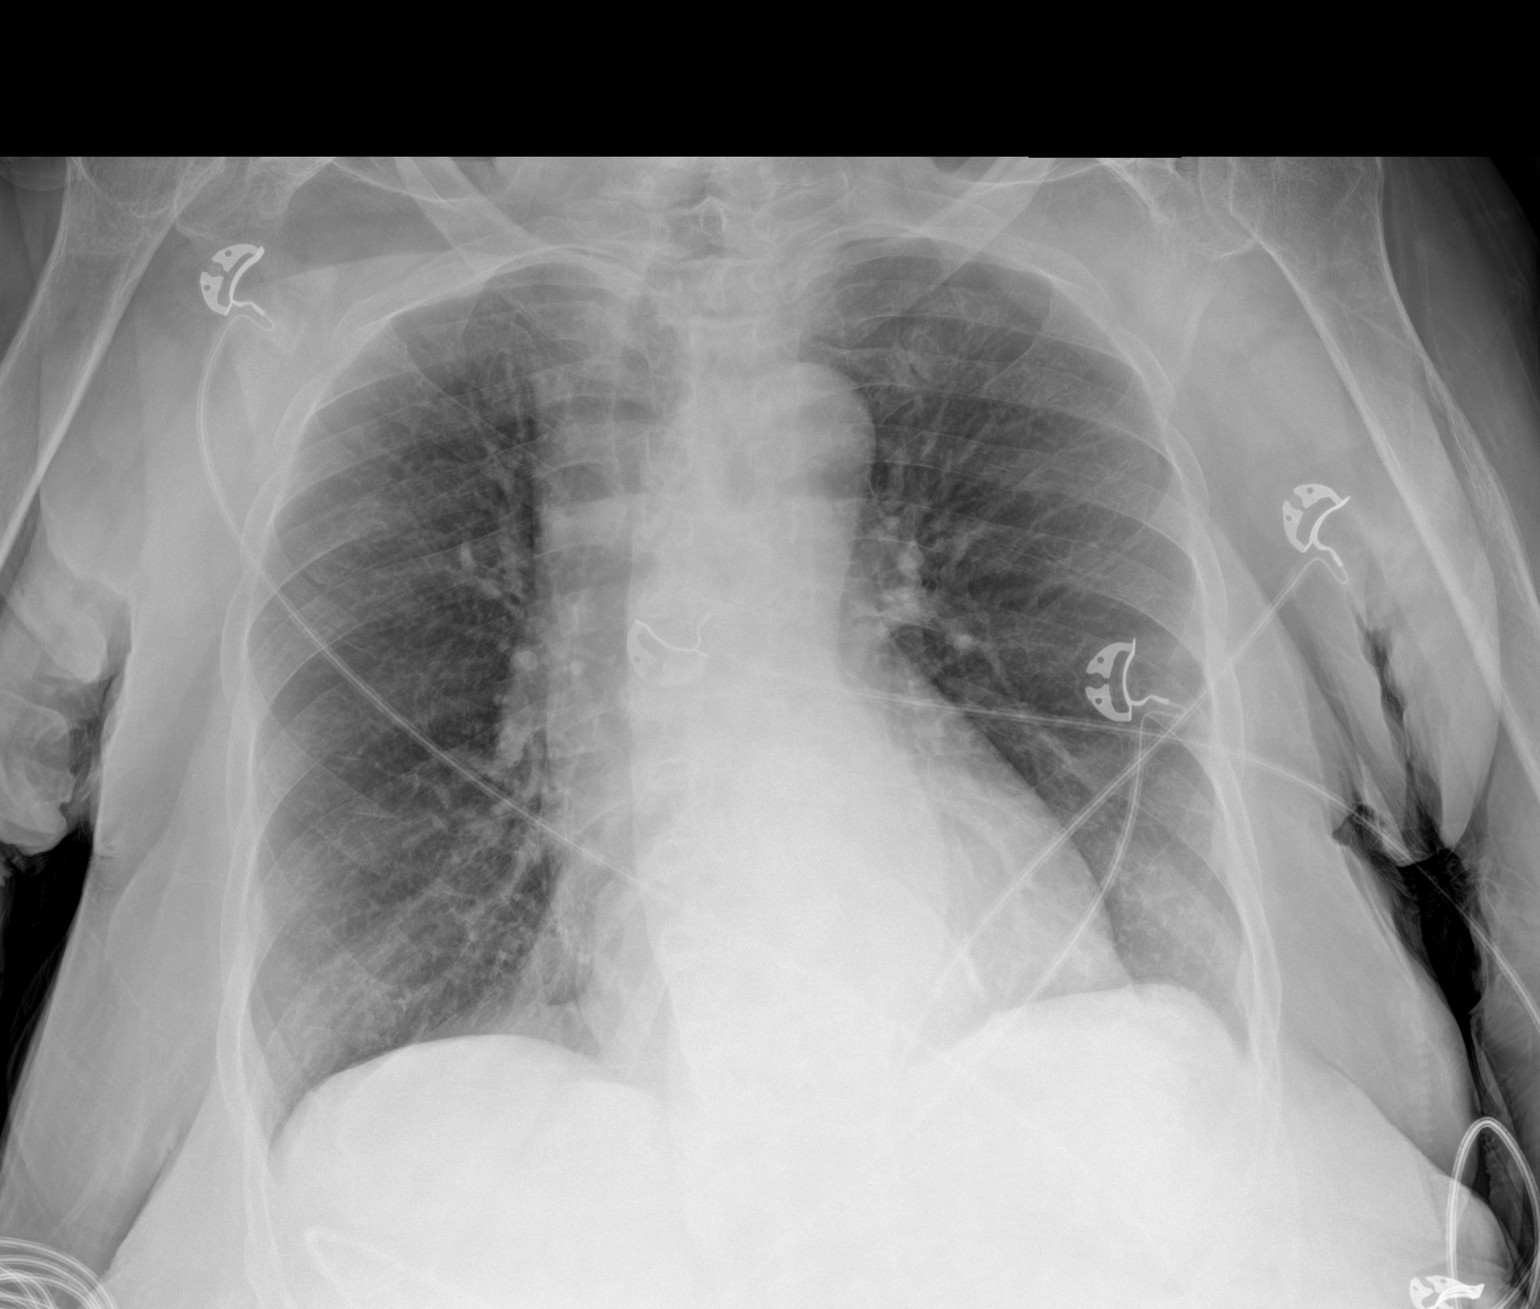

[1 of 1 positions shown; findings below may reference images not displayed]

FINDINGS: The heart size and mediastinal contours are within normal limits.
Both lungs are clear. The visualized skeletal structures are
unremarkable.
IMPRESSION: No active disease.

## 2019-10-13 IMAGING — CT CT HEAD W/O CM
3 series · 16 of 47 positions shown, 19 images · non-contrast
Comparison: None.

CLINICAL DATA: Encephalopathy. Chronic bilateral leg weakness.
Mental status change.

EXAM:
CT HEAD WITHOUT CONTRAST
TECHNIQUE: Contiguous axial images were obtained from the base of the skull
through the vertex without intravenous contrast.

[Series 2: head wo · axial · 0.40mm/px · z∈[+34,+159]mm · 10 of 30 slices shown, 13 images]
[im 3/30  brain]
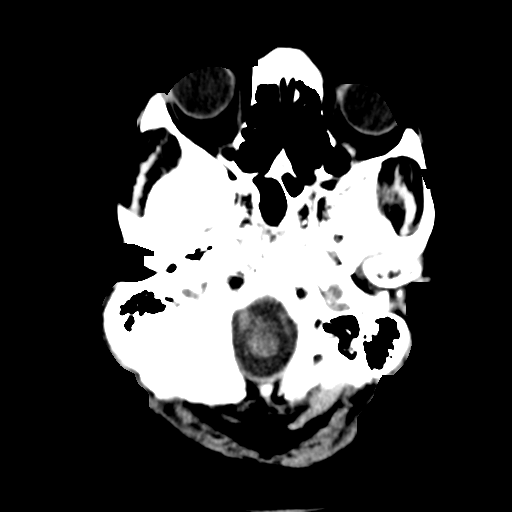
[im 3/30  bone]
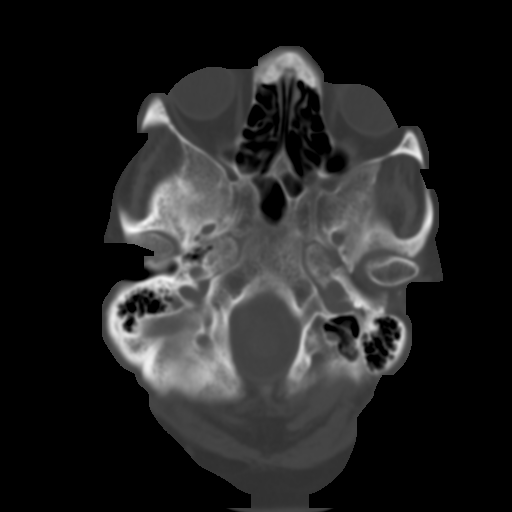
[im 6/30  brain]
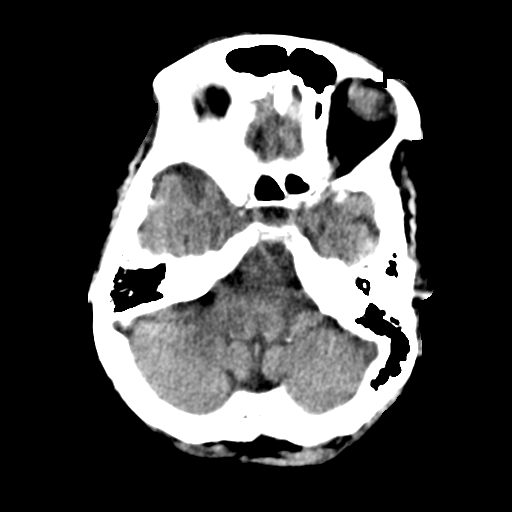
[im 9/30  brain]
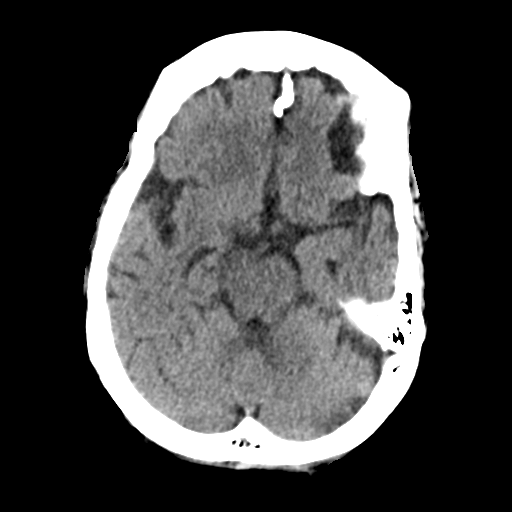
[im 11/30  brain]
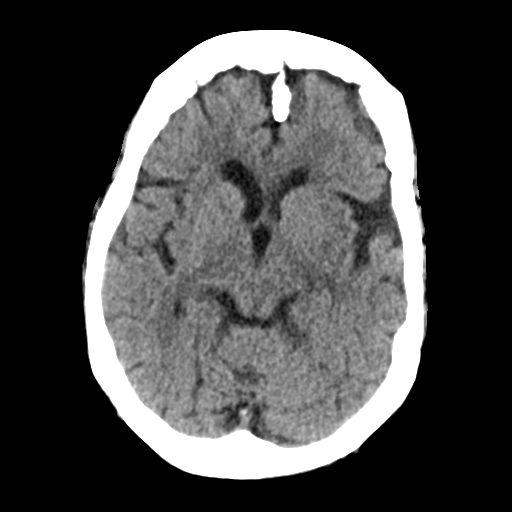
[im 14/30  brain]
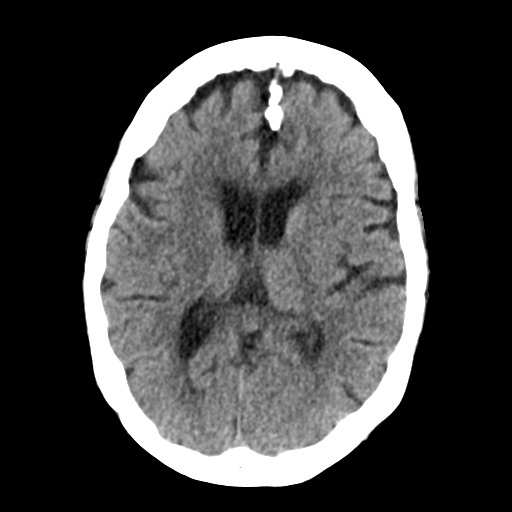
[im 14/30  bone]
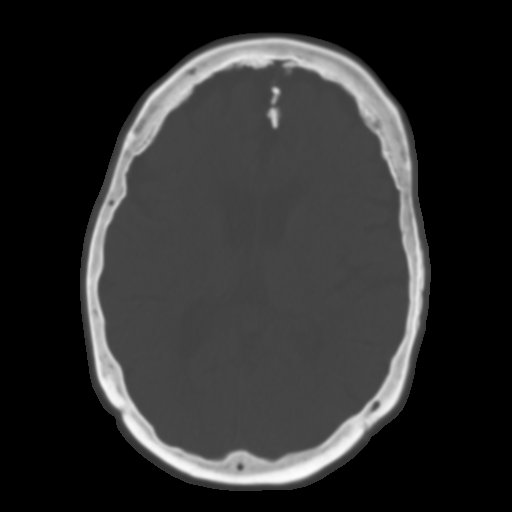
[im 17/30  brain]
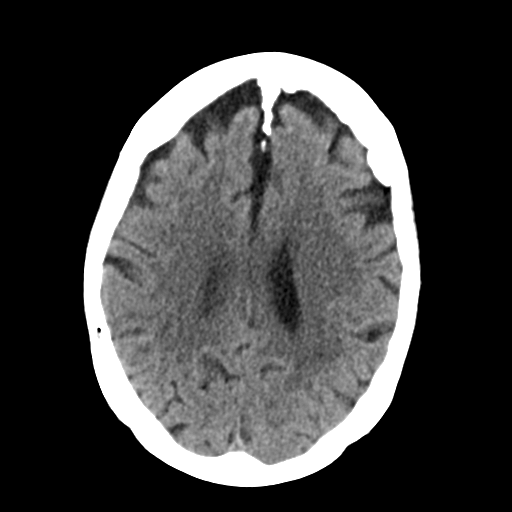
[im 20/30  brain]
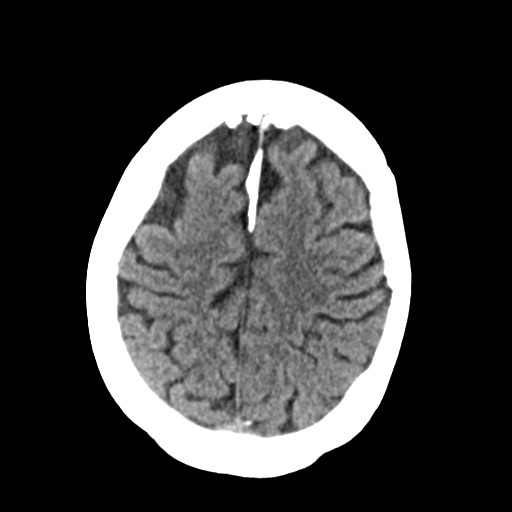
[im 23/30  brain]
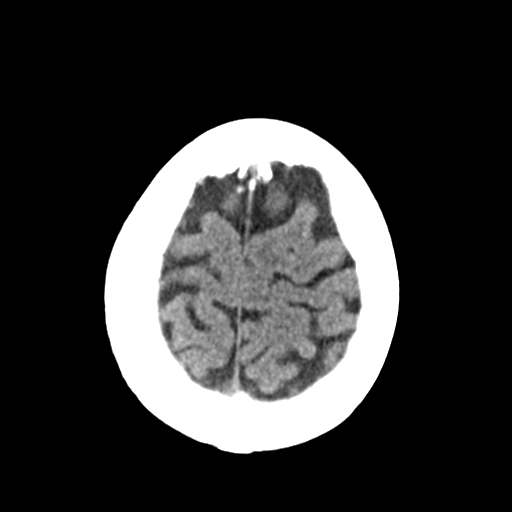
[im 25/30  brain]
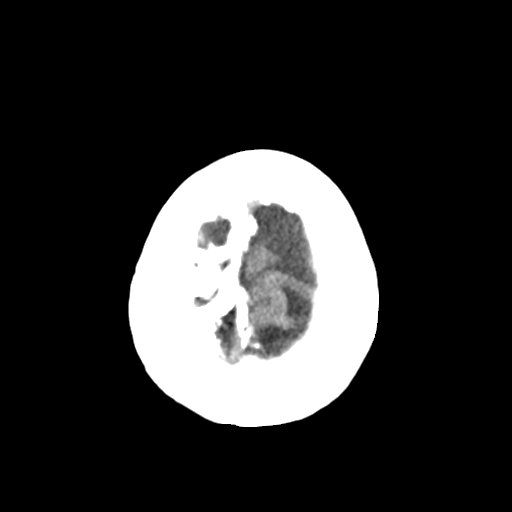
[im 25/30  bone]
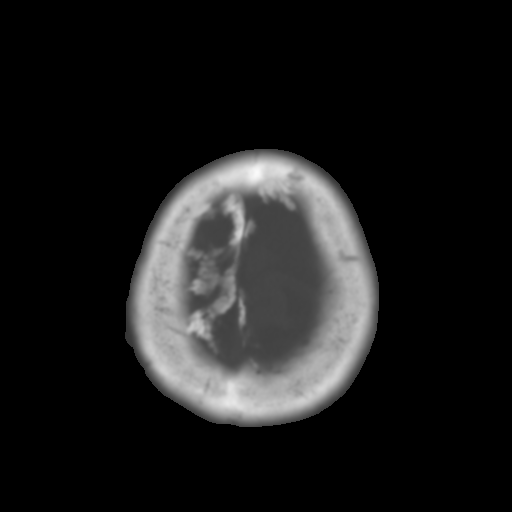
[im 28/30  brain]
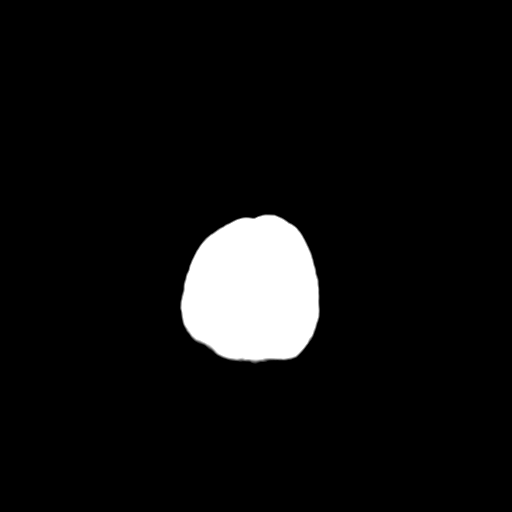

[Series 4: coronal soft tissue · coronal · 0.30mm/px · 3 of 62 slices shown]
[im 21/62  brain]
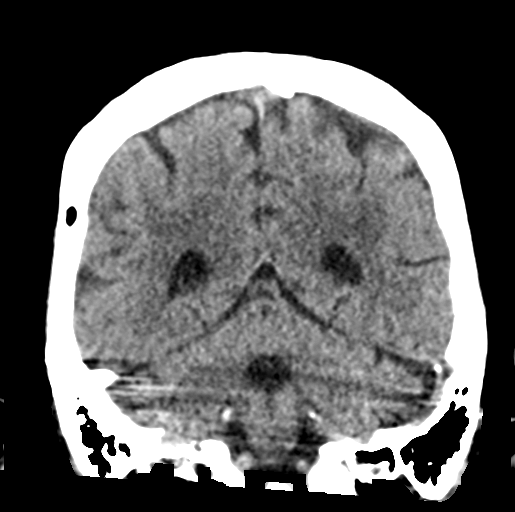
[im 28/62  brain]
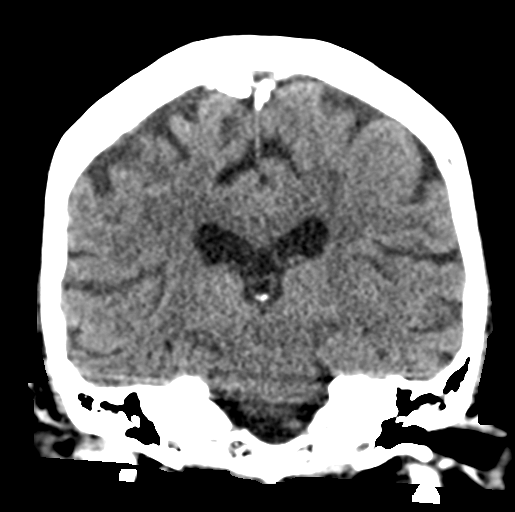
[im 34/62  brain]
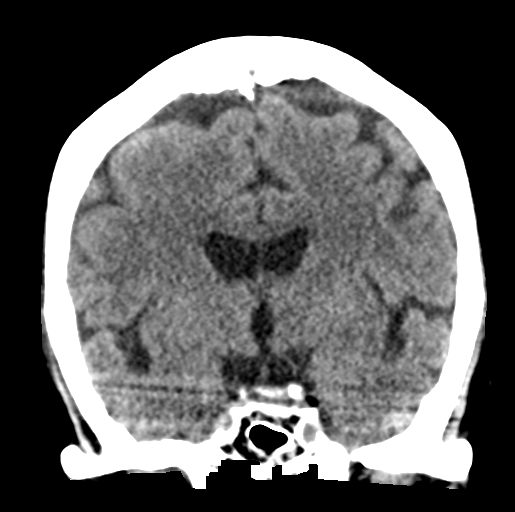

[Series 5: sagittal soft tissue · sagittal · 0.31mm/px · 3 of 51 slices shown]
[im 17/51  brain]
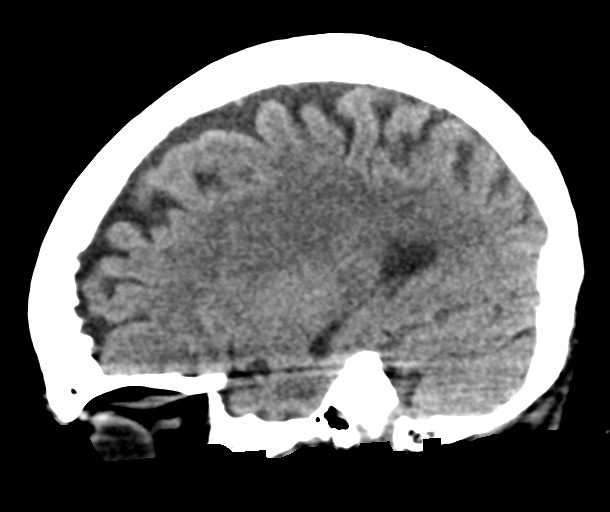
[im 26/51  brain]
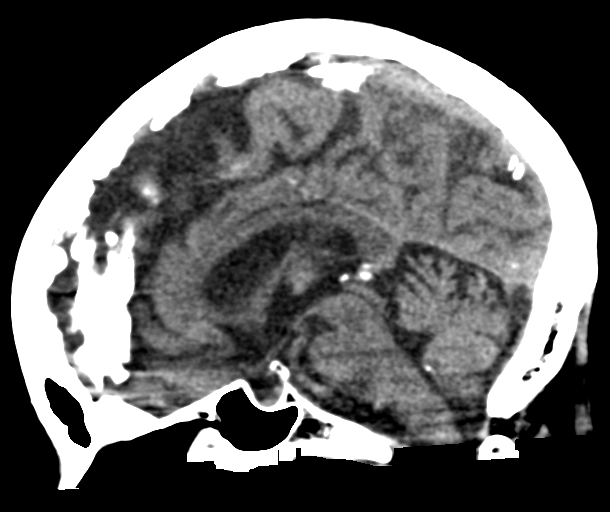
[im 34/51  brain]
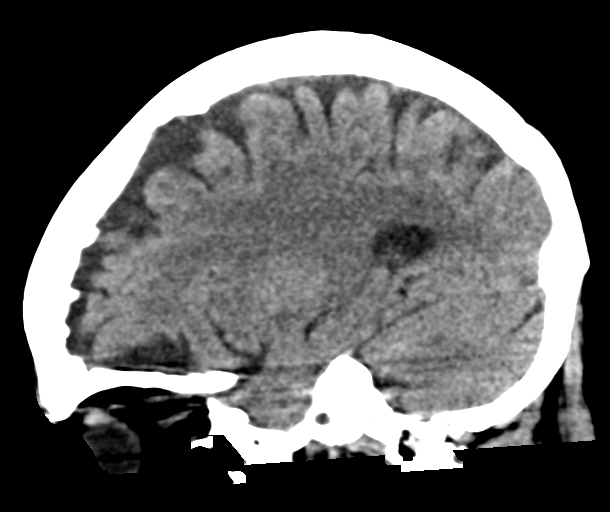

[16 of 47 positions shown; findings below may reference images not displayed]

FINDINGS: Brain: Mild age related volume loss. No evidence of old or acute
focal infarction, mass lesion, hemorrhage, hydrocephalus or
extra-axial collection.

Vascular: No abnormal vascular finding.

Skull: Normal

Sinuses/Orbits: Clear/normal

Other: None
IMPRESSION: Mild age related volume loss. No focal or acute finding. Normal exam
for age.

## 2019-10-13 MED ORDER — LORAZEPAM 2 MG/ML IJ SOLN
1.0000 mg | Freq: Once | INTRAMUSCULAR | Status: AC
  Administered 2019-10-13: 1 mg via INTRAVENOUS
  Filled 2019-10-13: qty 1

## 2019-10-13 MED ORDER — SODIUM CHLORIDE 0.9 % IV BOLUS
500.0000 mL | Freq: Once | INTRAVENOUS | Status: AC
  Administered 2019-10-13: 500 mL via INTRAVENOUS

## 2019-10-13 NOTE — Assessment & Plan Note (Signed)
Son reports compliance with nexium daily and pepcid nightly.

## 2019-10-13 NOTE — Assessment & Plan Note (Signed)
Apparently now off sertraline. Continues trazodone nightly for sleep.

## 2019-10-13 NOTE — ED Triage Notes (Signed)
Pt to ED by EMS from PCP where pt was being seen for chronic bilateral leg weakness. Pt had sudden change in mental status while at PCP and was sent to ED for further evaluation.

## 2019-10-13 NOTE — Patient Instructions (Addendum)
I recommend going to ER today for further evaluation for sudden change in mental status.  Stop gabapentin.  Stop oxybutynin.  Restart baby aspirin daily.  Med list updated to what she's currently taking - please verify at home.

## 2019-10-13 NOTE — Progress Notes (Signed)
This visit was conducted in person.  BP 138/82 (BP Location: Left Arm, Patient Position: Sitting, Cuff Size: Normal)   Pulse 79   Temp (!) 97.2 F (36.2 C) (Temporal)   SpO2 97%   BP at home elevated - 160/80s  CC: sudden mental status changes Subjective:    Patient ID: Brianna Woods, female    DOB: [birth year], 85 y.o.   MRN: [id number]  HPI: Brianna Woods is a 85 y.o. female presenting on 10/13/2019 for Other (Memory concerns)   Known h/o CVA, CAD, aortic ATH, HTN, HLD, MDD, hypothyroidism, new sCHF diagnosed last year.   Here with son [name]. Last seen 02/2019 - cancelled 2 appts Fall 2020 then missed appt last month - which is unlike her. We received call from son last week about recent changes - he found mother on the floor, pt not bathing, poor hygiene, loss of bladder control. Son came out to her house last week - - it was in [place]. She has had several falls - where she seems to slip out of the chair onto the floor. He moved his mother in with him about a week ago.  Mental status changes - she sleeps most of the day now. Increased confusion noted by son. She is now wearing depends 24 hours a day. New imbalance, marked trouble with ambulation. Son states he last saw her well 05/2019. He hadn't seen her for several months due to covid.   Son has washed her - no open sores seen.  Son and his wife work all day - so no one at home currently.   No fevers, cough, vomiting, diarrhea. No unilateral weakness.   Increased constipation, cold intolerance, has lost bladder control. Son endorses visual hallucinations. Increased confusion, trouble comprehending.   New onset CHF found during hospitalization 01/2019 - unclear if she's on cardiac meds or kept cards f/u.  Unsure how she has been taking her meds over the last 6 months.      Relevant past medical, surgical, family and social history reviewed and updated as indicated. Interim medical history since our last visit  reviewed. Allergies and medications reviewed and updated. Outpatient Medications Prior to Visit  Medication Sig Dispense Refill  . atorvastatin (LIPITOR) 40 MG tablet TAKE 1 TABLET ON MONDAY, WEDNESDAY AND FRIDAY 90 tablet 0  . esomeprazole (NEXIUM) 40 MG capsule TAKE 1 CAPSULE EVERY DAY AT 12 NOON 90 capsule 0  . famotidine (PEPCID) 40 MG tablet TAKE 1 TABLET (40 MG TOTAL) BY MOUTH AT BEDTIME (REPLACES ZANTAC) 90 tablet 0  . gabapentin (NEURONTIN) 300 MG capsule Take 1 capsule (300 mg total) by mouth at bedtime.    Marland Kitchen levothyroxine (SYNTHROID) 25 MCG tablet Take 1 tablet (25 mcg total) by mouth daily. 30 tablet 3  . traZODone (DESYREL) 100 MG tablet TAKE 1/2 TO 1 TABLET AT BEDTIME 90 tablet 0  . gabapentin (NEURONTIN) 300 MG capsule Take 1 capsule (300 mg total) by mouth 3 (three) times daily. 270 capsule 1  . oxybutynin (DITROPAN-XL) 10 MG 24 hr tablet TAKE 1 TABLET BY MOUTH TWICE A DAY 180 tablet 0  . albuterol (VENTOLIN HFA) 108 (90 Base) MCG/ACT inhaler Inhale 2 puffs into the lungs every 6 (six) hours as needed. for wheezing (Patient not taking: Reported on 10/13/2019) 54 g 3  . aspirin EC 81 MG tablet Take 1 tablet (81 mg total) by mouth daily. (Patient not taking: Reported on 10/13/2019)    . losartan (COZAAR) 100 MG tablet  Take 1 tablet (100 mg total) by mouth daily.    . ondansetron (ZOFRAN) 4 MG tablet TAKE 1 TABLET EVERY 12 HOURS AS NEEDED FOR NAUSEA (Patient not taking: Reported on 10/13/2019) 40 tablet 1  . vitamin C (ASCORBIC ACID) 500 MG tablet Take 500 mg by mouth daily.    Marland Kitchen amLODipine (NORVASC) 5 MG tablet TAKE 1 TABLET EVERY DAY (Patient not taking: Reported on 10/13/2019) 90 tablet 0  . carvedilol (COREG) 3.125 MG tablet Take 1 tablet (3.125 mg total) by mouth 2 (two) times daily with a meal.    . cholecalciferol 5000 UNITS TABS Take 5,000 Units by mouth daily. (Patient not taking: Reported on 10/13/2019) 90 tablet 3  . cloNIDine (CATAPRES) 0.1 MG tablet TAKE 1 TABLET TWICE DAILY AS  NEEDED (1 TABLET IN THE MORNING AND A SECOND TABLET IF BLOOD PRESSURE OVER 150/100) (Patient not taking: Reported on 10/13/2019) 180 tablet 0  . furosemide (LASIX) 20 MG tablet TAKE 1 TABLET DAILY AS NEEDED FOR EDEMA. (Patient not taking: Reported on 10/13/2019) 90 tablet 0  . levothyroxine (SYNTHROID) 50 MCG tablet TAKE 1 TABLET BY MOUTH EVERY DAY (Patient not taking: Reported on 10/13/2019) 90 tablet 0  . metoprolol tartrate (LOPRESSOR) 25 MG tablet TAKE 1 TABLET TWICE DAILY (Patient not taking: Reported on 10/13/2019) 180 tablet 0  . potassium chloride (K-DUR) 10 MEQ tablet TAKE 1 TABLET   DAILY AS NEEDED (TAKE WITH LASIX). (Patient not taking: Reported on 10/13/2019) 90 tablet 0  . sertraline (ZOLOFT) 25 MG tablet TAKE 1 TABLET EVERY DAY (Patient not taking: Reported on 10/13/2019) 90 tablet 0  . sertraline (ZOLOFT) 50 MG tablet Take 1 tablet (50 mg total) by mouth daily. (Patient not taking: Reported on 10/13/2019) 90 tablet 0   No facility-administered medications prior to visit.     Per HPI unless specifically indicated in ROS section below Review of Systems Objective:    BP 138/82 (BP Location: Left Arm, Patient Position: Sitting, Cuff Size: Normal)   Pulse 79   Temp (!) 97.2 F (36.2 C) (Temporal)   SpO2 97%   Wt Readings from Last 3 Encounters:  10/13/19 169 lb 12.1 oz (77 kg)  02/24/19 187 lb (84.8 kg)  02/11/19 173 lb (78.5 kg)    Physical Exam Vitals and nursing note reviewed.  Constitutional:      General: She is sleeping.     Appearance: She is overweight.     Comments: Very somnolent, difficulty arousing - wakes up to painful stimulus but then quickly falls back asleep  HENT:     Head: Normocephalic and atraumatic.  Eyes:     Pupils: Pupils are equal, round, and reactive to light.  Cardiovascular:     Rate and Rhythm: Normal rate and regular rhythm.     Pulses: Normal pulses.     Heart sounds: Normal heart sounds.  Pulmonary:     Effort: Pulmonary effort is normal. No  respiratory distress.     Breath sounds: Normal breath sounds. No wheezing, rhonchi or rales.  Abdominal:     Palpations: Abdomen is soft. There is no mass.     Tenderness: There is no abdominal tenderness. There is no guarding or rebound.  Musculoskeletal:     Right lower leg: No edema.     Left lower leg: No edema.  Skin:    General: Skin is warm and dry.     Findings: No rash.  Neurological:     Mental Status: She is lethargic, disoriented  and confused.     Comments:  Says her first and last name but son says she makes up a middle name (she does not have a middle name).  She was able to recognize me (Dr Danise Mina) but does not follow my commands.  Grip strength seems diminished bilaterally Bilateral UE muscle tone intact Son says somnolence is a new finding as of today.        Results for orders placed or performed during the hospital encounter of 01/25/19  CBC with Differential/Platelet  Result Value Ref Range   WBC 7.4 4.0 - 10.5 K/uL   RBC 3.78 (L) 3.87 - 5.11 MIL/uL   Hemoglobin 10.9 (L) 12.0 - 15.0 g/dL   HCT 33.8 (L) 36.0 - 46.0 %   MCV 89.4 80.0 - 100.0 fL   MCH 28.8 26.0 - 34.0 pg   MCHC 32.2 30.0 - 36.0 g/dL   RDW 14.1 11.5 - 15.5 %   Platelets 181 150 - 400 K/uL   nRBC 0.0 0.0 - 0.2 %   Neutrophils Relative % 54 %   Neutro Abs 4.0 1.7 - 7.7 K/uL   Lymphocytes Relative 31 %   Lymphs Abs 2.2 0.7 - 4.0 K/uL   Monocytes Relative 11 %   Monocytes Absolute 0.8 0.1 - 1.0 K/uL   Eosinophils Relative 4 %   Eosinophils Absolute 0.3 0.0 - 0.5 K/uL   Basophils Relative 0 %   Basophils Absolute 0.0 0.0 - 0.1 K/uL   Immature Granulocytes 0 %   Abs Immature Granulocytes 0.02 0.00 - 0.07 K/uL  Basic metabolic panel  Result Value Ref Range   Sodium 140 135 - 145 mmol/L   Potassium 3.7 3.5 - 5.1 mmol/L   Chloride 104 98 - 111 mmol/L   CO2 28 22 - 32 mmol/L   Glucose, Bld 97 70 - 99 mg/dL   BUN 14 8 - 23 mg/dL   Creatinine, Ser 1.01 (H) 0.44 - 1.00 mg/dL   Calcium  8.8 (L) 8.9 - 10.3 mg/dL   GFR calc non Af Amer 52 (L) >60 mL/min   GFR calc Af Amer >60 >60 mL/min   Anion gap 8 5 - 15  Brain natriuretic peptide  Result Value Ref Range   B Natriuretic Peptide 90.0 0.0 - 100.0 pg/mL   Assessment & Plan:  This visit occurred during the SARS-CoV-2 public health emergency.  Safety protocols were in place, including screening questions prior to the visit, additional usage of staff PPE, and extensive cleaning of exam room while observing appropriate contact time as indicated for disinfecting solutions.   Problem List Items Addressed This Visit    Urinary incontinence    Has been on BID oxybutynin 10mg  XL since 2018. Will stop.       Somnolence - Primary    Marked deterioration from baseline since I last saw patient 02/2019. Unclear when this started.  Son states somnolence is new today, with endorsed ongoing cold intolerance, visual hallucinations, worsened incontinence, deteriorated hygiene. No longer able to care for herself at home and son has moved her in to live with him.  Increased level of care need over last few months as well as sudden worsened somnolence. I did recommend ER evaluation today - as will need labs, likely UA, CXR, and head imaging to eval for possible recurrent stroke or metabolic disturbance as cause of symptoms.  I did advise they stop gabapentin and oxybutynin.  Son agrees with plan.  Son states he's interested in SNF  placement - discussed HH option pending ER evaluation.       Mixed incontinence   MDD (major depressive disorder), single episode, moderate (HCC)    Apparently now off sertraline. Continues trazodone nightly for sleep.       HTN (hypertension)    Apparently only on losartan 100mg  daily       Relevant Medications   losartan (COZAAR) 100 MG tablet   History of CVA (cerebrovascular accident)    Had not been taking aspirin - advised restart this.       GERD (gastroesophageal reflux disease)    Son reports  compliance with nexium daily and pepcid nightly.       CAD (coronary artery disease)   Relevant Medications   losartan (COZAAR) 100 MG tablet   Atherosclerosis of abdominal aorta (HCC)   Relevant Medications   losartan (COZAAR) 100 MG tablet   Acquired hypothyroidism    Son reports compliance with levothyroxine 10mcg daily (I had increased to 66mcg 01/2019). Uncontrolled hypothyroidism could account for several of today's symptoms - will need TSH checked.          No orders of the defined types were placed in this encounter.  No orders of the defined types were placed in this encounter.  Patient Instructions  I recommend going to ER today for further evaluation for sudden change in mental status.  Stop gabapentin.  Stop oxybutynin.  Restart baby aspirin daily.  Med list updated to what she's currently taking - please verify at home.    Follow up plan: No follow-ups on file.  Ria Bush, MD

## 2019-10-13 NOTE — Progress Notes (Signed)
PT Cancellation Note  Patient Details Name: Brianna Woods MRN: [id number] DOB: 1935/01/16   Cancelled Treatment:    Reason Eval/Treat Not Completed: Other (comment): Per nursing pt currently not appropriate for PT evaluation secondary to agitation/combativeness and AMS.  Will attempt to see pt at a future date/time as medically appropriate.     Linus Salmons PT, DPT 10/13/19, 2:57 PM

## 2019-10-13 NOTE — Assessment & Plan Note (Addendum)
Marked deterioration from baseline since I last saw patient 02/2019. Unclear when this started.  Son states somnolence is new today, with endorsed ongoing cold intolerance, visual hallucinations, worsened incontinence, deteriorated hygiene. No longer able to care for herself at home and son has moved her in to live with him.  Increased level of care need over last few months as well as sudden worsened somnolence. I did recommend ER evaluation today - as will need labs, likely UA, CXR, and head imaging to eval for possible recurrent stroke or metabolic disturbance as cause of symptoms.  I did advise they stop gabapentin and oxybutynin.  Son agrees with plan.  Son states he's interested in SNF placement - discussed HH option pending ER evaluation.

## 2019-10-13 NOTE — Assessment & Plan Note (Signed)
Had not been taking aspirin - advised restart this.

## 2019-10-13 NOTE — Assessment & Plan Note (Signed)
Has been on BID oxybutynin 10mg  XL since 2018. Will stop.

## 2019-10-13 NOTE — Assessment & Plan Note (Addendum)
Son reports compliance with levothyroxine 46mcg daily (I had increased to 95mcg 01/2019). Uncontrolled hypothyroidism could account for several of today's symptoms - will need TSH checked.

## 2019-10-13 NOTE — ED Provider Notes (Addendum)
[facility] Emergency Department Provider Note       Time seen: ----------------------------------------- 11:16 AM on 10/13/2019 -----------------------------------------   I have reviewed the triage vital signs and the nursing notes.  HISTORY  Chief Complaint Altered Mental Status    HPI Brianna Woods is a 85 y.o. female with a history of anemia, asthma, arthritis, coronary artery disease, fibromyalgia who presents to the ED for altered mental status.  Patient arrives by EMS from her primary care doctor's office where she was being seen for chronic leg weakness.  She had a sudden change in mental status while at the primary care doctor and was sent to the ER for further evaluation.  She denies any recent illness or other complaints.  Past Medical History:  Diagnosis Date  . Anemia   . Arthritis   . Asthma    per pt  . Atherosclerosis of abdominal aorta ([place]) 07/2015   by CT  . Barrett's esophagus    on EGD 2008, EGD WNL 2013  . CAD (coronary artery disease) 07/2015   of LAD by CT  . Diverticulosis 2014   sigmoid on colonoscopy  . Fatty pancreas 07/2015   by CT  . Fibromyalgia    per pt, no records of this   . GERD (gastroesophageal reflux disease)    and esoph stricture s/p dilation 2008 HH by CT 2016  . Glaucoma   . History of anxiety    was on prozac then effexor (pt denies h/o anxiety/depression)  . History of cardiac murmur   . History of chicken pox   . History of CVA (cerebrovascular accident) 2008   "I've had several TIAs"  . History of right bundle branch block 2011  . History of ulcer disease    per pt, no records of this  . HLD (hyperlipidemia)   . HTN (hypertension)   . Kidney cyst, acquired 07/2015   by CT, rec rpt renal MRI in 6 months  . Mixed incontinence    on ditropan  . Nephrolithiasis    s/p surgery R kidney (67mm) 11/2009  . Nodular goiter, toxic or with hyperthyroidism    h/o toxic, on methimazole, known large left  thyroid nodule 08/2013 s/p beign biopsy per patient 2010, saw Dr. Gabriel Carina, Rad I ablation 04/2014  . Vitamin D deficiency     Patient Active Problem List   Diagnosis Date Noted  . Low back pain 02/11/2019  . Acquired hypothyroidism 02/11/2019  . Injury of face 11/21/2018  . MDD (major depressive disorder), single episode, moderate ([place]) 07/09/2018  . Glaucoma   . Osteopenia 06/09/2018  . Hiatal hernia 06/09/2018  . Diverticulosis 06/09/2018  . Weight loss 05/23/2018  . Dysphagia 05/23/2018  . Insomnia 05/23/2018  . Uterine mass 05/23/2018  . Tricompartment osteoarthritis of right knee 02/13/2018  . Skin rash 04/21/2016  . Increased endometrial stripe thickness 10/21/2015  . Nephrolithiasis   . Pedal edema 09/25/2015  . Generalized abdominal pain 07/26/2015  . Kidney cyst, acquired 07/13/2015  . CAD (coronary artery disease) 07/13/2015  . Atherosclerosis of abdominal aorta ([place]) 07/13/2015  . Fatty pancreas 07/13/2015  . Health maintenance examination 03/22/2015  . Intertrigo 03/22/2015  . Advanced care planning/counseling discussion 09/21/2014  . Medicare annual wellness visit, subsequent 03/20/2014  . Urinary incontinence 03/09/2014  . Chest discomfort 08/15/2013  . Anxiety attack 05/18/2012  . History of right bundle branch block   . Vitamin D deficiency 02/10/2012  . Dizziness 01/22/2012  . Fibromyalgia   .  History of CVA (cerebrovascular accident)   . Asthma   . Nodular goiter, toxic or with hyperthyroidism   . GERD (gastroesophageal reflux disease)   . Mixed incontinence   . HTN (hypertension)   . HLD (hyperlipidemia)     Past Surgical History:  Procedure Laterality Date  . CATARACT EXTRACTION  1990   w/ implants  . CHOLECYSTECTOMY  2006  . COLONOSCOPY  10/02/12   diverticulosis, o/w WNL ([place])  . dexa     no records received  . ESOPHAGOGASTRODUODENOSCOPY  11/2011   LA grade A esophagitis lower 1/3, dilated, med HH, o/w WNL - path: + GERD, no barrett's - f/u  11/2016  . LITHOTRIPSY Right 2011  . thyroid uptake scan  04/2014   unifrm uptake, enlarged thyroid consistent with grave's dz    Allergies Ivp dye [iodinated diagnostic agents], Penicillins, and Statins  Social History Social History   Tobacco Use  . Smoking status: Never Smoker  . Smokeless tobacco: Never Used  Substance Use Topics  . Alcohol use: No    Alcohol/week: 0.0 standard drinks  . Drug use: No    Review of Systems Constitutional: Negative for fever. Cardiovascular: Negative for chest pain. Respiratory: Negative for shortness of breath. Gastrointestinal: Negative for abdominal pain, vomiting and diarrhea. Musculoskeletal: Negative for back pain. Skin: Negative for rash. Neurological: Positive for weakness  All systems negative/normal/unremarkable except as stated in the HPI  ____________________________________________   PHYSICAL EXAM:  VITAL SIGNS: ED Triage Vitals [10/13/19 1112]  Enc Vitals Group     BP 115/84     Pulse      Resp 17     Temp 98.4 F (36.9 C)     Temp Source Oral     SpO2 98 %     Weight      Height      Head Circumference      Peak Flow      Pain Score      Pain Loc      Pain Edu?      Excl. in [place]?     Constitutional: Alert and partially oriented.  Well appearing and in no distress. Eyes: Conjunctivae are normal. Normal extraocular movements. ENT      Head: Normocephalic and atraumatic.      Nose: No congestion/rhinnorhea.      Mouth/Throat: Mucous membranes are moist.      Neck: No stridor. Cardiovascular: Normal rate, regular rhythm. No murmurs, rubs, or gallops. Respiratory: Normal respiratory effort without tachypnea nor retractions. Breath sounds are clear and equal bilaterally. No wheezes/rales/rhonchi. Gastrointestinal: Soft and nontender. Normal bowel sounds Musculoskeletal: Nontender with normal range of motion in extremities. No lower extremity tenderness nor edema. Neurologic:  Normal speech and language. No  gross focal neurologic deficits are appreciated.  Skin:  Skin is warm, dry and intact. No rash noted. Psychiatric: Mood and affect are normal. Speech and behavior are normal.  ___________________________________________  ED COURSE:  As part of my medical decision making, I reviewed the following data within the [place] History obtained from family if available, nursing notes, old chart and ekg, as well as notes from prior ED visits. Patient presented for altered mental status and weakness, we will assess with labs and imaging as indicated at this time.   Procedures  Brianna Woods was evaluated in Emergency Department on 10/13/2019 for the symptoms described in the history of present illness. She was evaluated in the context of the global COVID-19 pandemic, which  necessitated consideration that the patient might be at risk for infection with the SARS-CoV-2 virus that causes COVID-19. Institutional protocols and algorithms that pertain to the evaluation of patients at risk for COVID-19 are in a state of rapid change based on information released by regulatory bodies including the CDC and federal and state organizations. These policies and algorithms were followed during the patient's care in the ED.  ____________________________________________   LABS (pertinent positives/negatives)  Labs Reviewed  CBC WITH DIFFERENTIAL/PLATELET - Abnormal; Notable for the following components:      Result Value   WBC 11.3 (*)    RBC 3.40 (*)    Hemoglobin 9.7 (*)    HCT 31.5 (*)    RDW 17.0 (*)    Neutro Abs 8.7 (*)    Abs Immature Granulocytes 0.08 (*)    All other components within normal limits  COMPREHENSIVE METABOLIC PANEL - Abnormal; Notable for the following components:   Creatinine, Ser 1.30 (*)    GFR calc non Af Amer 38 (*)    GFR calc Af Amer 44 (*)    All other components within normal limits  TROPONIN I (HIGH SENSITIVITY) - Abnormal; Notable for the following components:    Troponin I (High Sensitivity) 24 (*)    All other components within normal limits  URINALYSIS, COMPLETE (UACMP) WITH MICROSCOPIC  TROPONIN I (HIGH SENSITIVITY)    RADIOLOGY Images were viewed by me  CT head, chest x-ray Did not reveal any acute process ____________________________________________   DIFFERENTIAL DIAGNOSIS   Dementia, dehydration, electrolyte abnormality, occult infection, CVA, TIA, MI, medication side effect  FINAL ASSESSMENT AND PLAN  Altered mental status, weakness, acute urinary retention   Plan: The patient had presented for altered mental status and weakness. Patient's labs did reveal slight increase in her creatinine for which she was given IV fluid bolus. Patient's imaging not reveal any acute process.  Patient likely has advancing dementia and the family states they can no longer care for her.  She was retaining urine here and had a Foley catheter placed.  I will discuss with social work for placement.   Laurence Aly, MD    Note: This note was generated in part or whole with voice recognition software. Voice recognition is usually quite accurate but there are transcription errors that can and very often do occur. I apologize for any typographical errors that were not detected and corrected.     Earleen Newport, MD 10/13/19 1320    Earleen Newport, MD 10/13/19 [phone]

## 2019-10-13 NOTE — Assessment & Plan Note (Signed)
Apparently only on losartan 100mg  daily

## 2019-10-14 LAB — SARS CORONAVIRUS 2 (TAT 6-24 HRS): SARS Coronavirus 2: NEGATIVE

## 2019-10-14 MED ORDER — TRAZODONE HCL 50 MG PO TABS
50.0000 mg | ORAL_TABLET | Freq: Every day | ORAL | Status: DC
  Administered 2019-10-15 (×2): 50 mg via ORAL
  Filled 2019-10-14 (×2): qty 1

## 2019-10-14 MED ORDER — ATORVASTATIN CALCIUM 20 MG PO TABS
40.0000 mg | ORAL_TABLET | ORAL | Status: DC
  Administered 2019-10-14 – 2019-10-15 (×2): 40 mg via ORAL
  Filled 2019-10-14 (×3): qty 2

## 2019-10-14 MED ORDER — LOSARTAN POTASSIUM 50 MG PO TABS
100.0000 mg | ORAL_TABLET | Freq: Every day | ORAL | Status: DC
  Administered 2019-10-14 – 2019-10-16 (×3): 100 mg via ORAL
  Filled 2019-10-14 (×3): qty 2

## 2019-10-14 MED ORDER — PANTOPRAZOLE SODIUM 40 MG PO TBEC
80.0000 mg | DELAYED_RELEASE_TABLET | Freq: Every day | ORAL | Status: DC
  Administered 2019-10-14 – 2019-10-16 (×3): 80 mg via ORAL
  Filled 2019-10-14 (×3): qty 2

## 2019-10-14 MED ORDER — FAMOTIDINE 20 MG PO TABS
40.0000 mg | ORAL_TABLET | Freq: Every day | ORAL | Status: DC
  Administered 2019-10-15: 40 mg via ORAL
  Filled 2019-10-14: qty 2

## 2019-10-14 MED ORDER — GABAPENTIN 300 MG PO CAPS
300.0000 mg | ORAL_CAPSULE | Freq: Every day | ORAL | Status: DC
  Administered 2019-10-15 (×2): 300 mg via ORAL
  Filled 2019-10-14 (×2): qty 1

## 2019-10-14 MED ORDER — GABAPENTIN 300 MG PO CAPS
300.0000 mg | ORAL_CAPSULE | ORAL | Status: AC
  Administered 2019-10-14: 300 mg via ORAL
  Filled 2019-10-14: qty 1

## 2019-10-14 MED ORDER — ACETAMINOPHEN 325 MG PO TABS
650.0000 mg | ORAL_TABLET | Freq: Once | ORAL | Status: AC
  Administered 2019-10-14: 650 mg via ORAL
  Filled 2019-10-14: qty 2

## 2019-10-14 MED ORDER — LEVOTHYROXINE SODIUM 50 MCG PO TABS
25.0000 ug | ORAL_TABLET | Freq: Every day | ORAL | Status: DC
  Administered 2019-10-14 – 2019-10-16 (×3): 25 ug via ORAL
  Filled 2019-10-14 (×3): qty 1

## 2019-10-14 MED ORDER — LORAZEPAM 2 MG/ML IJ SOLN
1.0000 mg | Freq: Once | INTRAMUSCULAR | Status: AC
  Administered 2019-10-14: 1 mg via INTRAVENOUS
  Filled 2019-10-14: qty 1

## 2019-10-14 NOTE — ED Notes (Signed)
Pt's son- Jeneen Rinks- updated about pt status

## 2019-10-14 NOTE — ED Notes (Signed)
Brianna Woods NT at bedside pt resting in bed. NAD

## 2019-10-14 NOTE — ED Notes (Signed)
PT at bedside.

## 2019-10-14 NOTE — ED Notes (Signed)
Pt resting now with sitter Lydia at the bedside.

## 2019-10-14 NOTE — ED Notes (Signed)
This RN at bedside due to patient yelling and attempting to climb out of bed. Pt combative with staff at this time.

## 2019-10-14 NOTE — ED Notes (Signed)
Patient pulled up in bed by this RN and Mickel Baas tech.

## 2019-10-14 NOTE — NC FL2 (Signed)
[place] LEVEL OF CARE SCREENING TOOL     IDENTIFICATION  Patient Name: Brianna Woods Birthdate: 12-14-1934 Sex: female Admission Date (Current Location): 10/13/2019  [facility] and [place] Number:  Engineering geologist and Address:         Provider Number:    Attending Physician Name and Address:  No att. providers found  Relative Name and Phone Number:       Current Level of Care: Hospital Recommended Level of Care: [place] Prior Approval Number:    Date Approved/Denied:   PASRR Number: [id number]  Discharge Plan: SNF    Current Diagnoses: Patient Active Problem List   Diagnosis Date Noted  . Somnolence 10/13/2019  . Low back pain 02/11/2019  . Acquired hypothyroidism 02/11/2019  . Injury of face 11/21/2018  . MDD (major depressive disorder), single episode, moderate ([place]) 07/09/2018  . Glaucoma   . Osteopenia 06/09/2018  . Hiatal hernia 06/09/2018  . Diverticulosis 06/09/2018  . Weight loss 05/23/2018  . Dysphagia 05/23/2018  . Insomnia 05/23/2018  . Uterine mass 05/23/2018  . Tricompartment osteoarthritis of right knee 02/13/2018  . Skin rash 04/21/2016  . Increased endometrial stripe thickness 10/21/2015  . Nephrolithiasis   . Pedal edema 09/25/2015  . Generalized abdominal pain 07/26/2015  . Kidney cyst, acquired 07/13/2015  . CAD (coronary artery disease) 07/13/2015  . Atherosclerosis of abdominal aorta ([place]) 07/13/2015  . Fatty pancreas 07/13/2015  . Health maintenance examination 03/22/2015  . Intertrigo 03/22/2015  . Advanced care planning/counseling discussion 09/21/2014  . Medicare annual wellness visit, subsequent 03/20/2014  . Urinary incontinence 03/09/2014  . Chest discomfort 08/15/2013  . Anxiety attack 05/18/2012  . History of right bundle branch block   . Vitamin D deficiency 02/10/2012  . Dizziness 01/22/2012  . Fibromyalgia   . History of CVA (cerebrovascular accident)   . Asthma   . Nodular  goiter, toxic or with hyperthyroidism   . GERD (gastroesophageal reflux disease)   . Mixed incontinence   . HTN (hypertension)   . HLD (hyperlipidemia)     Orientation RESPIRATION BLADDER Height & Weight     Self, Time, Situation, Place  Normal Continent Weight: 77 kg Height:  5' (152.4 cm)  BEHAVIORAL SYMPTOMS/MOOD NEUROLOGICAL BOWEL NUTRITION STATUS      Continent Diet  AMBULATORY STATUS COMMUNICATION OF NEEDS Skin   Limited Assist Verbally Normal                       Personal Care Assistance Level of Assistance  Bathing, Feeding, Dressing Bathing Assistance: Limited assistance Feeding assistance: Limited assistance Dressing Assistance: Limited assistance     Functional Limitations Info             SPECIAL CARE FACTORS FREQUENCY  PT (By licensed PT), OT (By licensed OT)     PT Frequency: min 5xweekly OT Frequency: min 5xweekly            Contractures Contractures Info: Not present    Additional Factors Info                  Current Medications (10/14/2019):  This is the current hospital active medication list Current Facility-Administered Medications  Medication Dose Route Frequency Provider Last Rate Last Admin  . atorvastatin (LIPITOR) tablet 40 mg  40 mg Oral Q M,W,F Hinda Kehr, MD   40 mg at 10/14/19 0503  . famotidine (PEPCID) tablet 40 mg  40 mg Oral QHS Hinda Kehr, MD      .  gabapentin (NEURONTIN) capsule 300 mg  300 mg Oral QHS Hinda Kehr, MD      . levothyroxine (SYNTHROID) tablet 25 mcg  25 mcg Oral QAC breakfast Hinda Kehr, MD   25 mcg at 10/14/19 [id number]  . losartan (COZAAR) tablet 100 mg  100 mg Oral Daily Hinda Kehr, MD   100 mg at 10/14/19 1016  . pantoprazole (PROTONIX) EC tablet 80 mg  80 mg Oral Q1200 Hinda Kehr, MD   80 mg at 10/14/19 1235  . traZODone (DESYREL) tablet 50 mg  50 mg Oral QHS Hinda Kehr, MD       Current Outpatient Medications  Medication Sig Dispense Refill  . atorvastatin (LIPITOR) 40 MG  tablet TAKE 1 TABLET ON MONDAY, [facility] AND FRIDAY (Patient taking differently: Take 40 mg by mouth every Monday, Wednesday, and Friday. ) 90 tablet 0  . esomeprazole (NEXIUM) 40 MG capsule TAKE 1 CAPSULE EVERY DAY AT 12 NOON (Patient taking differently: Take 40 mg by mouth daily. ) 90 capsule 0  . famotidine (PEPCID) 40 MG tablet TAKE 1 TABLET (40 MG TOTAL) BY MOUTH AT BEDTIME (REPLACES ZANTAC) (Patient taking differently: Take 40 mg by mouth at bedtime. ) 90 tablet 0  . gabapentin (NEURONTIN) 300 MG capsule Take 1 capsule (300 mg total) by mouth at bedtime.    Marland Kitchen levothyroxine (SYNTHROID) 25 MCG tablet Take 25 mcg by mouth daily before breakfast.    . levothyroxine (SYNTHROID) 50 MCG tablet Take 50 mcg by mouth daily before breakfast.    . losartan (COZAAR) 100 MG tablet Take 1 tablet (100 mg total) by mouth daily.    . traZODone (DESYREL) 100 MG tablet TAKE 1/2 TO 1 TABLET AT BEDTIME (Patient taking differently: Take 50-100 mg by mouth at bedtime. ) 90 tablet 0  . vitamin C (ASCORBIC ACID) 500 MG tablet Take 500 mg by mouth daily.       Discharge Medications: Please see discharge summary for a list of discharge medications.  Relevant Imaging Results:  Relevant Lab Results:   Additional Information [id number]  Anselm Pancoast, RN

## 2019-10-14 NOTE — Evaluation (Signed)
Physical Therapy Evaluation Patient Details Name: Brianna Woods MRN: [id number] DOB: 1935/02/14 Today's Date: 10/14/2019   History of Present Illness  Per MD note: Pt is an 85 y.o. female with a history that includes anemia, asthma, arthritis, coronary artery disease, fibromyalgia who presents to the ED for altered mental status.  Patient arrives by EMS from her primary care doctor's office where she was being seen for chronic leg weakness.  She had a sudden change in mental status while at the primary care doctor and was sent to the ER for further evaluation.    Clinical Impression  Pt was unable to provide a reliable history with below history provided by son via phone call.  Pt was, however, pleasant and able to follow commands with occasional extra cuing and processing time.  Pt was able to get in and out of bed and to stand from an elevated EOB without physical assistance.  Once in standing pt was anxious and required encouragement to attempt ambulation.  Pt was able to take several steps staying near the EOB and with +2 assist for safety but during each amb session her BLEs would buckle with little warning with pt needing to be assisted back to sitting.  Per the pt's son the pt had been living alone up until around 2 weeks ago but was having frequent falls and the pt now lives with the son.  Per the son the pt continued to progressively weaken to the point where she no longer ambulates more than a few feet from chair to toilet.  Pt will benefit from PT services in a SNF setting upon discharge to safely address deficits listed in patient problem list for decreased caregiver assistance, decreased risk of further functional decline, and eventual return to PLOF.     Follow Up Recommendations SNF    Equipment Recommendations  Rolling walker with 5" wheels    Recommendations for Other Services       Precautions / Restrictions Precautions Precautions: Fall Restrictions Weight Bearing  Restrictions: No      Mobility  Bed Mobility Overal bed mobility: Modified Independent             General bed mobility comments: Extra time and effort but no physical assistance needed  Transfers Overall transfer level: Needs assistance Equipment used: Rolling walker (2 wheeled) Transfers: Sit to/from Stand Sit to Stand: +2 safety/equipment;Min guard         General transfer comment: Fair eccentric and concentric control with cues to come to full upright standing  Ambulation/Gait Ambulation/Gait assistance: Mod assist;+2 physical assistance Gait Distance (Feet): 4 Feet x 2 Assistive device: Rolling walker (2 wheeled) Gait Pattern/deviations: Step-through pattern;Decreased step length - right;Decreased step length - left;Trunk flexed Gait velocity: decreased   General Gait Details: Heavy lean on the RW with pt anxious requiring encouragement to participate; pt's BLEs buckled after 3-4 very small steps at the EOB with pt assisted back to sitting  Stairs            Wheelchair Mobility    Modified Rankin (Stroke Patients Only)       Balance Overall balance assessment: Needs assistance   Sitting balance-Leahy Scale: Good     Standing balance support: Bilateral upper extremity supported Standing balance-Leahy Scale: Poor                               Pertinent Vitals/Pain Pain Assessment: 0-10 Pain Score: 7  Pain  Location: RLE Pain Descriptors / Indicators: Aching;Sore Pain Intervention(s): Premedicated before session;Monitored during session    [place] expects to be discharged to:: Private residence Living Arrangements: Children Available Help at Discharge: Family;Available PRN/intermittently Type of Home: House Home Access: Stairs to enter Entrance Stairs-Rails: None Entrance Stairs-Number of Steps: 2 Home Layout: One level Home Equipment: Walker - 4 wheels Additional Comments: History from son via phone call  secondary to pt being a poor historian    Prior Function Level of Independence: Needs assistance   Gait / Transfers Assistance Needed: Pt had been living alone ambulating with a RW but has had a h/o falls and progressive BLE weakness and has been living with son x 2 wks with limited amb with physical assist and continued falls  ADL's / Homemaking Assistance Needed: Family assists with all ADLs        Hand Dominance        Extremity/Trunk Assessment   Upper Extremity Assessment Upper Extremity Assessment: Generalized weakness    Lower Extremity Assessment Lower Extremity Assessment: Generalized weakness       Communication   Communication: No difficulties  Cognition Arousal/Alertness: Awake/alert Behavior During Therapy: WFL for tasks assessed/performed Overall Cognitive Status: No family/caregiver present to determine baseline cognitive functioning                                        General Comments      Exercises Total Joint Exercises Ankle Circles/Pumps: AROM;Strengthening;Both;5 reps;10 reps Quad Sets: Strengthening;Both;5 reps;10 reps Heel Slides: AROM;AAROM;Both;10 reps Hip ABduction/ADduction: AROM;AAROM;Both;10 reps Straight Leg Raises: AAROM;Both;10 reps Long Arc Quad: Strengthening;Both;10 reps Marching in Standing: AROM;Both;5 reps;Standing   Assessment/Plan    PT Assessment Patient needs continued PT services  PT Problem List Decreased strength;Decreased activity tolerance;Decreased balance;Decreased mobility;Decreased knowledge of use of DME;Decreased safety awareness       PT Treatment Interventions DME instruction;Gait training;Stair training;Functional mobility training;Therapeutic activities;Therapeutic exercise;Balance training;Patient/family education    PT Goals (Current goals can be found in the Care Plan section)  Acute Rehab PT Goals Patient Stated Goal: To get stronger and not fall as much PT Goal Formulation: With  patient Time For Goal Achievement: 10/27/19 Potential to Achieve Goals: Fair    Frequency Min 2X/week   Barriers to discharge Inaccessible home environment;Decreased caregiver support      Co-evaluation               AM-PAC PT "6 Clicks" Mobility  Outcome Measure Help needed turning from your back to your side while in a flat bed without using bedrails?: None Help needed moving from lying on your back to sitting on the side of a flat bed without using bedrails?: None Help needed moving to and from a bed to a chair (including a wheelchair)?: A Lot Help needed standing up from a chair using your arms (e.g., wheelchair or bedside chair)?: A Little Help needed to walk in hospital room?: Total Help needed climbing 3-5 steps with a railing? : Total 6 Click Score: 15    End of Session Equipment Utilized During Treatment: Gait belt Activity Tolerance: Patient tolerated treatment well Patient left: in bed;with call bell/phone within reach;with bed alarm set Nurse Communication: Mobility status PT Visit Diagnosis: Unsteadiness on feet (R26.81);History of falling (Z91.81);Repeated falls (R29.6);Muscle weakness (generalized) (M62.81);Difficulty in walking, not elsewhere classified (R26.2)    Time: [id number] PT Time Calculation (min) (ACUTE ONLY): 43  min   Charges:   PT Evaluation $PT Eval Moderate Complexity: 1 Mod PT Treatments $Therapeutic Exercise: 8-22 mins        D. Scott Montravious Weigelt PT, DPT 10/14/19, 1:18 PM

## 2019-10-14 NOTE — TOC Initial Note (Signed)
Transition of Care [facility]) - Initial/Assessment Note    Patient Details  Name: Brianna Woods MRN: [id number] Date of Birth: [birth year]  [facility] CM/SW Contact:    Anselm Pancoast, RN Phone Number: 10/14/2019, 12:15 PM  Clinical Narrative:                 Patient was living alone and having increased falls until a few weeks ago was able to move into the home with her son. Patient has been having increased weakness and falls and family is interested in SNF placement for rehab to return home.   Expected Discharge Plan: [place]     Patient Goals and CMS Choice Patient states their goals for this hospitalization and ongoing recovery are:: get stronger and get home      Expected Discharge Plan and Services Expected Discharge Plan: [place]       Living arrangements for the past 2 months: Single Family Home                                      Prior Living Arrangements/Services Living arrangements for the past 2 months: Single Family Home Lives with:: Adult Children(living with son for last few weeks) Patient language and need for interpreter reviewed:: Yes Do you feel safe going back to the place where you live?: Yes      Need for Family Participation in Patient Care: Yes (Comment) Care giver support system in place?: Yes (comment)   Criminal Activity/Legal Involvement Pertinent to Current Situation/Hospitalization: No - Comment as needed  Activities of Daily Living      Permission Sought/Granted                  Emotional Assessment Appearance:: Appears older than stated age Attitude/Demeanor/Rapport: Engaged Affect (typically observed): Accepting Orientation: : Oriented to Self, Oriented to Place, Oriented to  Time, Oriented to Situation(occasional confusion) Alcohol / Substance Use: Never Used Psych Involvement: No (comment)  Admission diagnosis:  ams Patient Active Problem List   Diagnosis Date Noted  .  Somnolence 10/13/2019  . Low back pain 02/11/2019  . Acquired hypothyroidism 02/11/2019  . Injury of face 11/21/2018  . MDD (major depressive disorder), single episode, moderate ([place]) 07/09/2018  . Glaucoma   . Osteopenia 06/09/2018  . Hiatal hernia 06/09/2018  . Diverticulosis 06/09/2018  . Weight loss 05/23/2018  . Dysphagia 05/23/2018  . Insomnia 05/23/2018  . Uterine mass 05/23/2018  . Tricompartment osteoarthritis of right knee 02/13/2018  . Skin rash 04/21/2016  . Increased endometrial stripe thickness 10/21/2015  . Nephrolithiasis   . Pedal edema 09/25/2015  . Generalized abdominal pain 07/26/2015  . Kidney cyst, acquired 07/13/2015  . CAD (coronary artery disease) 07/13/2015  . Atherosclerosis of abdominal aorta ([place]) 07/13/2015  . Fatty pancreas 07/13/2015  . Health maintenance examination 03/22/2015  . Intertrigo 03/22/2015  . Advanced care planning/counseling discussion 09/21/2014  . Medicare annual wellness visit, subsequent 03/20/2014  . Urinary incontinence 03/09/2014  . Chest discomfort 08/15/2013  . Anxiety attack 05/18/2012  . History of right bundle branch block   . Vitamin D deficiency 02/10/2012  . Dizziness 01/22/2012  . Fibromyalgia   . History of CVA (cerebrovascular accident)   . Asthma   . Nodular goiter, toxic or with hyperthyroidism   . GERD (gastroesophageal reflux disease)   . Mixed incontinence   . HTN (hypertension)   .  HLD (hyperlipidemia)    PCP:  Ria Bush, MD Pharmacy:   [id number] - [place], [address] [place] [address] Phone: [phone] Fax: [facility] [place], [place] [address] Phone: [phone] Fax: [phone]     Social Determinants of Health (SDOH) Interventions    Readmission Risk Interventions No flowsheet data found.

## 2019-10-14 NOTE — ED Notes (Signed)
PT with gown off and legs thrown over side of bed attempting to get out of bed. PT states she needs to get cleaned up. Provided pt with warm soapy rags and pt cleansed face, this RN performed pericare. Attempted to put new brief on pt but pt has already unfastened it. PT has also pulled out her IV. Called charge nurse for sitter for maximum pt safety. This Rn to remain at bedside until sitter arrives. PT calm at this time.

## 2019-10-14 NOTE — Care Management (Signed)
TOC RN CM: PASSR and FL2 completed and bed request sent out. Will update as offers are available.

## 2019-10-15 MED ORDER — LORAZEPAM 2 MG/ML IJ SOLN
1.0000 mg | Freq: Once | INTRAMUSCULAR | Status: AC
  Administered 2019-10-15: 1 mg via INTRAVENOUS
  Filled 2019-10-15: qty 1

## 2019-10-15 NOTE — ED Notes (Signed)
Pt given meal tray.

## 2019-10-15 NOTE — ED Notes (Signed)
Patient awake stripping off gown and brief. Patient stating we are going to [place] in the morning.

## 2019-10-15 NOTE — ED Notes (Signed)
Comb provided. New clothing and foley anchor.  Pt siting in chair position. Breakfast given.

## 2019-10-15 NOTE — Care Management (Addendum)
TOC RN CM: Called to [place], dtr to advise of bed offers for [facility]. Dtr selected [facility].   TOC RN CM: Call to [facility] to accept bed offer. Otila Kluver states patient will need to be without a sitter for 24 hours prior to discharging to SNF. RN updated nurse of need for no sitter for 24 hours.   TOC RN CM: Call to [employer]-confirmed patient is not followed by [facility] and SNF will obtain authorization from [facility].

## 2019-10-15 NOTE — ED Notes (Signed)
Returned with coffee.  Pt slid back down in bed and asleep. Left pt asleep at this time.

## 2019-10-15 NOTE — ED Notes (Signed)
Pt repositioned in bed, vital signs taken, and given water.

## 2019-10-15 NOTE — ED Notes (Signed)
mits placed on pt at this time to prevent pt from pulling on catheter, pt resting in bed.

## 2019-10-15 NOTE — ED Notes (Signed)
Pt pulled up in bed.  mits removed for trial period. Pt asking for coffee.  Will bring coffee back.

## 2019-10-16 DIAGNOSIS — R319 Hematuria, unspecified: Secondary | ICD-10-CM | POA: Diagnosis not present

## 2019-10-16 DIAGNOSIS — E569 Vitamin deficiency, unspecified: Secondary | ICD-10-CM | POA: Diagnosis not present

## 2019-10-16 DIAGNOSIS — K219 Gastro-esophageal reflux disease without esophagitis: Secondary | ICD-10-CM | POA: Diagnosis not present

## 2019-10-16 DIAGNOSIS — E7849 Other hyperlipidemia: Secondary | ICD-10-CM | POA: Diagnosis not present

## 2019-10-16 DIAGNOSIS — M6281 Muscle weakness (generalized): Secondary | ICD-10-CM | POA: Diagnosis not present

## 2019-10-16 DIAGNOSIS — N390 Urinary tract infection, site not specified: Secondary | ICD-10-CM | POA: Diagnosis not present

## 2019-10-16 DIAGNOSIS — R531 Weakness: Secondary | ICD-10-CM | POA: Diagnosis not present

## 2019-10-16 DIAGNOSIS — R4182 Altered mental status, unspecified: Secondary | ICD-10-CM | POA: Diagnosis not present

## 2019-10-16 DIAGNOSIS — E039 Hypothyroidism, unspecified: Secondary | ICD-10-CM | POA: Diagnosis not present

## 2019-10-16 DIAGNOSIS — R488 Other symbolic dysfunctions: Secondary | ICD-10-CM | POA: Diagnosis not present

## 2019-10-16 DIAGNOSIS — M797 Fibromyalgia: Secondary | ICD-10-CM | POA: Diagnosis not present

## 2019-10-16 DIAGNOSIS — R2681 Unsteadiness on feet: Secondary | ICD-10-CM | POA: Diagnosis not present

## 2019-10-16 DIAGNOSIS — Z20822 Contact with and (suspected) exposure to covid-19: Secondary | ICD-10-CM | POA: Diagnosis not present

## 2019-10-16 DIAGNOSIS — R279 Unspecified lack of coordination: Secondary | ICD-10-CM | POA: Diagnosis not present

## 2019-10-16 DIAGNOSIS — R404 Transient alteration of awareness: Secondary | ICD-10-CM | POA: Diagnosis not present

## 2019-10-16 DIAGNOSIS — R339 Retention of urine, unspecified: Secondary | ICD-10-CM | POA: Diagnosis not present

## 2019-10-16 DIAGNOSIS — I119 Hypertensive heart disease without heart failure: Secondary | ICD-10-CM | POA: Diagnosis not present

## 2019-10-16 DIAGNOSIS — Z743 Need for continuous supervision: Secondary | ICD-10-CM | POA: Diagnosis not present

## 2019-10-16 DIAGNOSIS — R569 Unspecified convulsions: Secondary | ICD-10-CM | POA: Diagnosis not present

## 2019-10-16 DIAGNOSIS — D649 Anemia, unspecified: Secondary | ICD-10-CM | POA: Diagnosis not present

## 2019-10-16 DIAGNOSIS — F0390 Unspecified dementia without behavioral disturbance: Secondary | ICD-10-CM | POA: Diagnosis not present

## 2019-10-16 DIAGNOSIS — R451 Restlessness and agitation: Secondary | ICD-10-CM | POA: Diagnosis not present

## 2019-10-16 DIAGNOSIS — E049 Nontoxic goiter, unspecified: Secondary | ICD-10-CM | POA: Diagnosis not present

## 2019-10-16 DIAGNOSIS — D519 Vitamin B12 deficiency anemia, unspecified: Secondary | ICD-10-CM | POA: Diagnosis not present

## 2019-10-16 DIAGNOSIS — Z1159 Encounter for screening for other viral diseases: Secondary | ICD-10-CM | POA: Diagnosis not present

## 2019-10-16 DIAGNOSIS — F3289 Other specified depressive episodes: Secondary | ICD-10-CM | POA: Diagnosis not present

## 2019-10-16 DIAGNOSIS — I10 Essential (primary) hypertension: Secondary | ICD-10-CM | POA: Diagnosis not present

## 2019-10-16 DIAGNOSIS — I2510 Atherosclerotic heart disease of native coronary artery without angina pectoris: Secondary | ICD-10-CM | POA: Diagnosis not present

## 2019-10-16 DIAGNOSIS — Z79899 Other long term (current) drug therapy: Secondary | ICD-10-CM | POA: Diagnosis not present

## 2019-10-16 DIAGNOSIS — F0391 Unspecified dementia with behavioral disturbance: Secondary | ICD-10-CM | POA: Diagnosis not present

## 2019-10-16 DIAGNOSIS — G4700 Insomnia, unspecified: Secondary | ICD-10-CM | POA: Diagnosis not present

## 2019-10-16 DIAGNOSIS — I959 Hypotension, unspecified: Secondary | ICD-10-CM | POA: Diagnosis not present

## 2019-10-16 NOTE — Care Management (Addendum)
TOC RN CM: Incoming call from daughter, Ventura Sellers upset that information was given to son not listed on emergency contact. Information is only to be given to [place] or Barth Kirks. Family has concerns on other family members intentions of dealing with patient. Confirmed patient did not require sitter overnight and [facility] is seeking authorization for SNF.   Incoming call from [place] [facility]-authorization received and patient admitting to room 804. Nurse and daughter aware .

## 2019-10-16 NOTE — ED Notes (Addendum)
Report given to Maudie Mercury, RN at [facility] for pt to go to room 804

## 2019-10-16 NOTE — ED Notes (Signed)
[facility] CALLED FOR  TRANSPORT  TO  [facility]

## 2019-10-16 NOTE — ED Provider Notes (Signed)
Procedures     ----------------------------------------- 12:30 PM on 10/16/2019 -----------------------------------------  Plan for patient to be discharged to [facility].  No new symptoms, remains medically stable for discharge.    Carrie Mew, MD 10/16/19 1230

## 2019-10-16 NOTE — ED Notes (Signed)
Pt unable to sign for discharge d/t dementia 

## 2019-10-16 NOTE — ED Notes (Signed)
Pt given lunch tray.

## 2019-10-17 ENCOUNTER — Telehealth: Payer: Self-pay

## 2019-10-17 NOTE — Telephone Encounter (Signed)
Brianna Woods, patient's daughter in law, called and wanted to let Dr Darnell Level know that patient went to ER after seen Dr Darnell Level on 10/13/19 and hospital placed patient in [facility] in [place]. Patient is no longer mobile or continent on her own. If there are any questions Marybeth's call back is [phone].

## 2019-10-18 DIAGNOSIS — E039 Hypothyroidism, unspecified: Secondary | ICD-10-CM | POA: Diagnosis not present

## 2019-10-18 DIAGNOSIS — K219 Gastro-esophageal reflux disease without esophagitis: Secondary | ICD-10-CM | POA: Diagnosis not present

## 2019-10-18 DIAGNOSIS — M6281 Muscle weakness (generalized): Secondary | ICD-10-CM | POA: Diagnosis not present

## 2019-10-18 DIAGNOSIS — I10 Essential (primary) hypertension: Secondary | ICD-10-CM | POA: Diagnosis not present

## 2019-10-21 DIAGNOSIS — F0390 Unspecified dementia without behavioral disturbance: Secondary | ICD-10-CM | POA: Diagnosis not present

## 2019-10-21 DIAGNOSIS — R451 Restlessness and agitation: Secondary | ICD-10-CM | POA: Diagnosis not present

## 2019-10-21 DIAGNOSIS — F3289 Other specified depressive episodes: Secondary | ICD-10-CM | POA: Diagnosis not present

## 2019-10-21 DIAGNOSIS — G4700 Insomnia, unspecified: Secondary | ICD-10-CM | POA: Diagnosis not present

## 2019-10-29 DIAGNOSIS — E039 Hypothyroidism, unspecified: Secondary | ICD-10-CM | POA: Diagnosis not present

## 2019-10-29 DIAGNOSIS — D649 Anemia, unspecified: Secondary | ICD-10-CM | POA: Diagnosis not present

## 2019-10-29 DIAGNOSIS — M6281 Muscle weakness (generalized): Secondary | ICD-10-CM | POA: Diagnosis not present

## 2019-10-29 DIAGNOSIS — N390 Urinary tract infection, site not specified: Secondary | ICD-10-CM | POA: Diagnosis not present

## 2019-10-29 DIAGNOSIS — K219 Gastro-esophageal reflux disease without esophagitis: Secondary | ICD-10-CM | POA: Diagnosis not present

## 2019-10-29 DIAGNOSIS — I10 Essential (primary) hypertension: Secondary | ICD-10-CM | POA: Diagnosis not present

## 2019-10-29 DIAGNOSIS — F0390 Unspecified dementia without behavioral disturbance: Secondary | ICD-10-CM | POA: Diagnosis not present

## 2019-10-31 DIAGNOSIS — R451 Restlessness and agitation: Secondary | ICD-10-CM | POA: Diagnosis not present

## 2019-10-31 DIAGNOSIS — F0390 Unspecified dementia without behavioral disturbance: Secondary | ICD-10-CM | POA: Diagnosis not present

## 2019-10-31 DIAGNOSIS — G4700 Insomnia, unspecified: Secondary | ICD-10-CM | POA: Diagnosis not present

## 2019-10-31 DIAGNOSIS — F3289 Other specified depressive episodes: Secondary | ICD-10-CM | POA: Diagnosis not present

## 2019-11-11 DIAGNOSIS — G4700 Insomnia, unspecified: Secondary | ICD-10-CM | POA: Diagnosis not present

## 2019-11-11 DIAGNOSIS — F3289 Other specified depressive episodes: Secondary | ICD-10-CM | POA: Diagnosis not present

## 2019-11-11 DIAGNOSIS — F0390 Unspecified dementia without behavioral disturbance: Secondary | ICD-10-CM | POA: Diagnosis not present

## 2019-11-11 DIAGNOSIS — R451 Restlessness and agitation: Secondary | ICD-10-CM | POA: Diagnosis not present

## 2019-11-13 DIAGNOSIS — F0391 Unspecified dementia with behavioral disturbance: Secondary | ICD-10-CM | POA: Diagnosis not present

## 2019-11-13 DIAGNOSIS — K219 Gastro-esophageal reflux disease without esophagitis: Secondary | ICD-10-CM | POA: Diagnosis not present

## 2019-11-13 DIAGNOSIS — I10 Essential (primary) hypertension: Secondary | ICD-10-CM | POA: Diagnosis not present

## 2019-11-13 DIAGNOSIS — E039 Hypothyroidism, unspecified: Secondary | ICD-10-CM | POA: Diagnosis not present

## 2019-11-13 DIAGNOSIS — M6281 Muscle weakness (generalized): Secondary | ICD-10-CM | POA: Diagnosis not present

## 2019-11-14 DIAGNOSIS — R569 Unspecified convulsions: Secondary | ICD-10-CM | POA: Diagnosis not present

## 2019-11-14 DIAGNOSIS — D649 Anemia, unspecified: Secondary | ICD-10-CM | POA: Diagnosis not present

## 2019-11-14 DIAGNOSIS — D519 Vitamin B12 deficiency anemia, unspecified: Secondary | ICD-10-CM | POA: Diagnosis not present

## 2019-11-14 DIAGNOSIS — F0390 Unspecified dementia without behavioral disturbance: Secondary | ICD-10-CM | POA: Diagnosis not present

## 2019-11-17 DIAGNOSIS — D649 Anemia, unspecified: Secondary | ICD-10-CM | POA: Diagnosis not present

## 2019-11-17 DIAGNOSIS — I10 Essential (primary) hypertension: Secondary | ICD-10-CM | POA: Diagnosis not present

## 2019-11-18 ENCOUNTER — Other Ambulatory Visit: Payer: Self-pay

## 2019-11-18 NOTE — Patient Outreach (Signed)
[facility]) Care Management  11/18/2019  Brianna Woods 04/08/1935 [id number]   Referral Date: 11/18/19 Referral Source: [employer] Report Referral Reason: Discharge from [facility] 11/15/19  Referral received.  No outreach warranted at this time.  Patient is long term at [facility].     Plan: RN CM will close case.      Jone Baseman, RN, MSN [facility] Care Management Care Management Coordinator Direct Line [phone] Toll Free: [phone]  Fax: [phone]

## 2019-11-19 DIAGNOSIS — F0390 Unspecified dementia without behavioral disturbance: Secondary | ICD-10-CM | POA: Diagnosis not present

## 2019-11-19 DIAGNOSIS — E569 Vitamin deficiency, unspecified: Secondary | ICD-10-CM | POA: Diagnosis not present

## 2019-11-19 DIAGNOSIS — R2681 Unsteadiness on feet: Secondary | ICD-10-CM | POA: Diagnosis not present

## 2019-11-19 DIAGNOSIS — R1312 Dysphagia, oropharyngeal phase: Secondary | ICD-10-CM | POA: Diagnosis not present

## 2019-11-19 DIAGNOSIS — R488 Other symbolic dysfunctions: Secondary | ICD-10-CM | POA: Diagnosis not present

## 2019-11-19 DIAGNOSIS — K219 Gastro-esophageal reflux disease without esophagitis: Secondary | ICD-10-CM | POA: Diagnosis not present

## 2019-11-19 DIAGNOSIS — F3289 Other specified depressive episodes: Secondary | ICD-10-CM | POA: Diagnosis not present

## 2019-11-19 DIAGNOSIS — I10 Essential (primary) hypertension: Secondary | ICD-10-CM | POA: Diagnosis not present

## 2019-11-19 DIAGNOSIS — E7849 Other hyperlipidemia: Secondary | ICD-10-CM | POA: Diagnosis not present

## 2019-11-20 DIAGNOSIS — K219 Gastro-esophageal reflux disease without esophagitis: Secondary | ICD-10-CM | POA: Diagnosis not present

## 2019-11-20 DIAGNOSIS — D649 Anemia, unspecified: Secondary | ICD-10-CM | POA: Diagnosis not present

## 2019-11-20 DIAGNOSIS — E569 Vitamin deficiency, unspecified: Secondary | ICD-10-CM | POA: Diagnosis not present

## 2019-11-20 DIAGNOSIS — R488 Other symbolic dysfunctions: Secondary | ICD-10-CM | POA: Diagnosis not present

## 2019-11-20 DIAGNOSIS — I10 Essential (primary) hypertension: Secondary | ICD-10-CM | POA: Diagnosis not present

## 2019-11-20 DIAGNOSIS — F3289 Other specified depressive episodes: Secondary | ICD-10-CM | POA: Diagnosis not present

## 2019-11-20 DIAGNOSIS — F0390 Unspecified dementia without behavioral disturbance: Secondary | ICD-10-CM | POA: Diagnosis not present

## 2019-11-20 DIAGNOSIS — R2681 Unsteadiness on feet: Secondary | ICD-10-CM | POA: Diagnosis not present

## 2019-11-20 DIAGNOSIS — R1312 Dysphagia, oropharyngeal phase: Secondary | ICD-10-CM | POA: Diagnosis not present

## 2019-11-20 DIAGNOSIS — E7849 Other hyperlipidemia: Secondary | ICD-10-CM | POA: Diagnosis not present

## 2019-11-21 DIAGNOSIS — R2681 Unsteadiness on feet: Secondary | ICD-10-CM | POA: Diagnosis not present

## 2019-11-21 DIAGNOSIS — R488 Other symbolic dysfunctions: Secondary | ICD-10-CM | POA: Diagnosis not present

## 2019-11-21 DIAGNOSIS — K219 Gastro-esophageal reflux disease without esophagitis: Secondary | ICD-10-CM | POA: Diagnosis not present

## 2019-11-21 DIAGNOSIS — F0390 Unspecified dementia without behavioral disturbance: Secondary | ICD-10-CM | POA: Diagnosis not present

## 2019-11-21 DIAGNOSIS — E7849 Other hyperlipidemia: Secondary | ICD-10-CM | POA: Diagnosis not present

## 2019-11-21 DIAGNOSIS — F3289 Other specified depressive episodes: Secondary | ICD-10-CM | POA: Diagnosis not present

## 2019-11-21 DIAGNOSIS — R1312 Dysphagia, oropharyngeal phase: Secondary | ICD-10-CM | POA: Diagnosis not present

## 2019-11-21 DIAGNOSIS — E569 Vitamin deficiency, unspecified: Secondary | ICD-10-CM | POA: Diagnosis not present

## 2019-11-21 DIAGNOSIS — I10 Essential (primary) hypertension: Secondary | ICD-10-CM | POA: Diagnosis not present

## 2019-11-22 ENCOUNTER — Encounter: Payer: Self-pay | Admitting: *Deleted

## 2019-11-22 ENCOUNTER — Emergency Department: Payer: Medicare HMO

## 2019-11-22 ENCOUNTER — Inpatient Hospital Stay
Admission: EM | Admit: 2019-11-22 | Discharge: 2019-12-01 | DRG: 871 | Disposition: A | Discharge status: Hospice | Payer: Medicare HMO | Attending: Internal Medicine | Admitting: Internal Medicine

## 2019-11-22 ENCOUNTER — Other Ambulatory Visit: Payer: Self-pay

## 2019-11-22 DIAGNOSIS — Z8249 Family history of ischemic heart disease and other diseases of the circulatory system: Secondary | ICD-10-CM

## 2019-11-22 DIAGNOSIS — Z79890 Hormone replacement therapy (postmenopausal): Secondary | ICD-10-CM

## 2019-11-22 DIAGNOSIS — R627 Adult failure to thrive: Secondary | ICD-10-CM | POA: Diagnosis present

## 2019-11-22 DIAGNOSIS — T68XXXA Hypothermia, initial encounter: Secondary | ICD-10-CM | POA: Diagnosis not present

## 2019-11-22 DIAGNOSIS — Z20822 Contact with and (suspected) exposure to covid-19: Secondary | ICD-10-CM | POA: Diagnosis present

## 2019-11-22 DIAGNOSIS — E162 Hypoglycemia, unspecified: Secondary | ICD-10-CM | POA: Diagnosis not present

## 2019-11-22 DIAGNOSIS — Z79899 Other long term (current) drug therapy: Secondary | ICD-10-CM

## 2019-11-22 DIAGNOSIS — E878 Other disorders of electrolyte and fluid balance, not elsewhere classified: Secondary | ICD-10-CM | POA: Diagnosis present

## 2019-11-22 DIAGNOSIS — A419 Sepsis, unspecified organism: Secondary | ICD-10-CM | POA: Diagnosis not present

## 2019-11-22 DIAGNOSIS — R4182 Altered mental status, unspecified: Secondary | ICD-10-CM | POA: Diagnosis present

## 2019-11-22 DIAGNOSIS — Z8349 Family history of other endocrine, nutritional and metabolic diseases: Secondary | ICD-10-CM

## 2019-11-22 DIAGNOSIS — E785 Hyperlipidemia, unspecified: Secondary | ICD-10-CM | POA: Diagnosis present

## 2019-11-22 DIAGNOSIS — Z743 Need for continuous supervision: Secondary | ICD-10-CM | POA: Diagnosis not present

## 2019-11-22 DIAGNOSIS — K219 Gastro-esophageal reflux disease without esophagitis: Secondary | ICD-10-CM | POA: Diagnosis present

## 2019-11-22 DIAGNOSIS — Z7189 Other specified counseling: Secondary | ICD-10-CM | POA: Diagnosis not present

## 2019-11-22 DIAGNOSIS — F0390 Unspecified dementia without behavioral disturbance: Secondary | ICD-10-CM | POA: Diagnosis not present

## 2019-11-22 DIAGNOSIS — E538 Deficiency of other specified B group vitamins: Secondary | ICD-10-CM | POA: Diagnosis present

## 2019-11-22 DIAGNOSIS — M797 Fibromyalgia: Secondary | ICD-10-CM | POA: Diagnosis present

## 2019-11-22 DIAGNOSIS — F3289 Other specified depressive episodes: Secondary | ICD-10-CM | POA: Diagnosis not present

## 2019-11-22 DIAGNOSIS — I119 Hypertensive heart disease without heart failure: Secondary | ICD-10-CM | POA: Diagnosis not present

## 2019-11-22 DIAGNOSIS — R4020 Unspecified coma: Secondary | ICD-10-CM | POA: Diagnosis not present

## 2019-11-22 DIAGNOSIS — Z515 Encounter for palliative care: Secondary | ICD-10-CM | POA: Diagnosis present

## 2019-11-22 DIAGNOSIS — Z823 Family history of stroke: Secondary | ICD-10-CM

## 2019-11-22 DIAGNOSIS — G629 Polyneuropathy, unspecified: Secondary | ICD-10-CM | POA: Diagnosis present

## 2019-11-22 DIAGNOSIS — I2510 Atherosclerotic heart disease of native coronary artery without angina pectoris: Secondary | ICD-10-CM | POA: Diagnosis present

## 2019-11-22 DIAGNOSIS — G9341 Metabolic encephalopathy: Secondary | ICD-10-CM | POA: Diagnosis not present

## 2019-11-22 DIAGNOSIS — J90 Pleural effusion, not elsewhere classified: Secondary | ICD-10-CM | POA: Diagnosis not present

## 2019-11-22 DIAGNOSIS — G9340 Encephalopathy, unspecified: Secondary | ICD-10-CM

## 2019-11-22 DIAGNOSIS — E039 Hypothyroidism, unspecified: Secondary | ICD-10-CM | POA: Diagnosis present

## 2019-11-22 DIAGNOSIS — R404 Transient alteration of awareness: Secondary | ICD-10-CM | POA: Diagnosis not present

## 2019-11-22 DIAGNOSIS — G4700 Insomnia, unspecified: Secondary | ICD-10-CM | POA: Diagnosis not present

## 2019-11-22 DIAGNOSIS — Z66 Do not resuscitate: Secondary | ICD-10-CM | POA: Diagnosis not present

## 2019-11-22 DIAGNOSIS — R5381 Other malaise: Secondary | ICD-10-CM | POA: Diagnosis not present

## 2019-11-22 DIAGNOSIS — E871 Hypo-osmolality and hyponatremia: Secondary | ICD-10-CM | POA: Diagnosis not present

## 2019-11-22 DIAGNOSIS — E876 Hypokalemia: Secondary | ICD-10-CM | POA: Diagnosis not present

## 2019-11-22 DIAGNOSIS — E870 Hyperosmolality and hypernatremia: Secondary | ICD-10-CM | POA: Diagnosis not present

## 2019-11-22 DIAGNOSIS — E11649 Type 2 diabetes mellitus with hypoglycemia without coma: Secondary | ICD-10-CM | POA: Diagnosis not present

## 2019-11-22 DIAGNOSIS — D696 Thrombocytopenia, unspecified: Secondary | ICD-10-CM | POA: Diagnosis present

## 2019-11-22 DIAGNOSIS — E559 Vitamin D deficiency, unspecified: Secondary | ICD-10-CM | POA: Diagnosis present

## 2019-11-22 DIAGNOSIS — Z833 Family history of diabetes mellitus: Secondary | ICD-10-CM

## 2019-11-22 DIAGNOSIS — Z9049 Acquired absence of other specified parts of digestive tract: Secondary | ICD-10-CM

## 2019-11-22 DIAGNOSIS — K2270 Barrett's esophagus without dysplasia: Secondary | ICD-10-CM | POA: Diagnosis present

## 2019-11-22 DIAGNOSIS — I959 Hypotension, unspecified: Secondary | ICD-10-CM | POA: Diagnosis not present

## 2019-11-22 DIAGNOSIS — E43 Unspecified severe protein-calorie malnutrition: Secondary | ICD-10-CM | POA: Diagnosis not present

## 2019-11-22 DIAGNOSIS — Z6825 Body mass index (BMI) 25.0-25.9, adult: Secondary | ICD-10-CM

## 2019-11-22 DIAGNOSIS — J45909 Unspecified asthma, uncomplicated: Secondary | ICD-10-CM | POA: Diagnosis present

## 2019-11-22 DIAGNOSIS — R279 Unspecified lack of coordination: Secondary | ICD-10-CM | POA: Diagnosis not present

## 2019-11-22 DIAGNOSIS — I10 Essential (primary) hypertension: Secondary | ICD-10-CM | POA: Diagnosis not present

## 2019-11-22 DIAGNOSIS — N200 Calculus of kidney: Secondary | ICD-10-CM | POA: Diagnosis not present

## 2019-11-22 DIAGNOSIS — R451 Restlessness and agitation: Secondary | ICD-10-CM | POA: Diagnosis not present

## 2019-11-22 DIAGNOSIS — J9811 Atelectasis: Secondary | ICD-10-CM | POA: Diagnosis not present

## 2019-11-22 DIAGNOSIS — Z87442 Personal history of urinary calculi: Secondary | ICD-10-CM

## 2019-11-22 DIAGNOSIS — R680 Hypothermia, not associated with low environmental temperature: Secondary | ICD-10-CM | POA: Diagnosis not present

## 2019-11-22 DIAGNOSIS — D649 Anemia, unspecified: Secondary | ICD-10-CM | POA: Diagnosis present

## 2019-11-22 DIAGNOSIS — Z8673 Personal history of transient ischemic attack (TIA), and cerebral infarction without residual deficits: Secondary | ICD-10-CM

## 2019-11-22 DIAGNOSIS — I517 Cardiomegaly: Secondary | ICD-10-CM | POA: Diagnosis not present

## 2019-11-22 LAB — COMPREHENSIVE METABOLIC PANEL
ALT: 23 U/L (ref 0–44)
AST: 25 U/L (ref 15–41)
Albumin: 3.1 g/dL — ABNORMAL LOW (ref 3.5–5.0)
Alkaline Phosphatase: 69 U/L (ref 38–126)
Anion gap: 10 (ref 5–15)
BUN: 35 mg/dL — ABNORMAL HIGH (ref 8–23)
CO2: 27 mmol/L (ref 22–32)
Calcium: 8.6 mg/dL — ABNORMAL LOW (ref 8.9–10.3)
Chloride: 120 mmol/L — ABNORMAL HIGH (ref 98–111)
Creatinine, Ser: 1.15 mg/dL — ABNORMAL HIGH (ref 0.44–1.00)
GFR calc Af Amer: 51 mL/min — ABNORMAL LOW (ref >60)
GFR calc non Af Amer: 44 mL/min — ABNORMAL LOW (ref >60)
Glucose, Bld: 154 mg/dL — ABNORMAL HIGH (ref 70–99)
Potassium: 2.4 mmol/L — CL (ref 3.5–5.1)
Sodium: 157 mmol/L — ABNORMAL HIGH (ref 135–145)
Total Bilirubin: 1.2 mg/dL (ref 0.3–1.2)
Total Protein: 5.4 g/dL — ABNORMAL LOW (ref 6.5–8.1)

## 2019-11-22 LAB — URINALYSIS, ROUTINE W REFLEX MICROSCOPIC
Bacteria, UA: NONE SEEN
Bilirubin Urine: NEGATIVE
Glucose, UA: NEGATIVE mg/dL
Ketones, ur: 20 mg/dL — AB
Leukocytes,Ua: NEGATIVE
Nitrite: NEGATIVE
Protein, ur: 30 mg/dL — AB
Specific Gravity, Urine: 1.018 (ref 1.005–1.030)
pH: 6 (ref 5.0–8.0)

## 2019-11-22 LAB — CBC WITH DIFFERENTIAL/PLATELET
Abs Immature Granulocytes: 0.08 10*3/uL — ABNORMAL HIGH (ref 0.00–0.07)
Basophils Absolute: 0 10*3/uL (ref 0.0–0.1)
Basophils Relative: 0 %
Eosinophils Absolute: 0 10*3/uL (ref 0.0–0.5)
Eosinophils Relative: 1 %
HCT: 32.9 % — ABNORMAL LOW (ref 36.0–46.0)
Hemoglobin: 9.7 g/dL — ABNORMAL LOW (ref 12.0–15.0)
Immature Granulocytes: 2 %
Lymphocytes Relative: 19 %
Lymphs Abs: 1 10*3/uL (ref 0.7–4.0)
MCH: 30.9 pg (ref 26.0–34.0)
MCHC: 29.5 g/dL — ABNORMAL LOW (ref 30.0–36.0)
MCV: 104.8 fL — ABNORMAL HIGH (ref 80.0–100.0)
Monocytes Absolute: 0.1 10*3/uL (ref 0.1–1.0)
Monocytes Relative: 2 %
Neutro Abs: 4 10*3/uL (ref 1.7–7.7)
Neutrophils Relative %: 76 %
Platelets: 75 10*3/uL — ABNORMAL LOW (ref 150–400)
RBC: 3.14 MIL/uL — ABNORMAL LOW (ref 3.87–5.11)
RDW: 16.8 % — ABNORMAL HIGH (ref 11.5–15.5)
WBC: 5.2 10*3/uL (ref 4.0–10.5)
nRBC: 1.9 % — ABNORMAL HIGH (ref 0.0–0.2)

## 2019-11-22 LAB — GLUCOSE, CAPILLARY
Glucose-Capillary: 62 mg/dL — ABNORMAL LOW (ref 70–99)
Glucose-Capillary: 118 mg/dL — ABNORMAL HIGH (ref 70–99)

## 2019-11-22 LAB — PROCALCITONIN: Procalcitonin: <0.1 ng/mL

## 2019-11-22 LAB — APTT: aPTT: 32 seconds (ref 24–36)

## 2019-11-22 LAB — PROTIME-INR
INR: 1.1 (ref 0.8–1.2)
Prothrombin Time: 14.2 seconds (ref 11.4–15.2)

## 2019-11-22 LAB — LACTIC ACID, PLASMA: Lactic Acid, Venous: 1.2 mmol/L (ref 0.5–1.9)

## 2019-11-22 IMAGING — DX DG CHEST 1V PORT
1 series · 1 of 1 positions shown · non-contrast
Comparison: Chest radiograph dated [DATE].

CLINICAL DATA: 84-year-old female with hypothermia.

EXAM:
PORTABLE CHEST 1 VIEW

[chest ap]
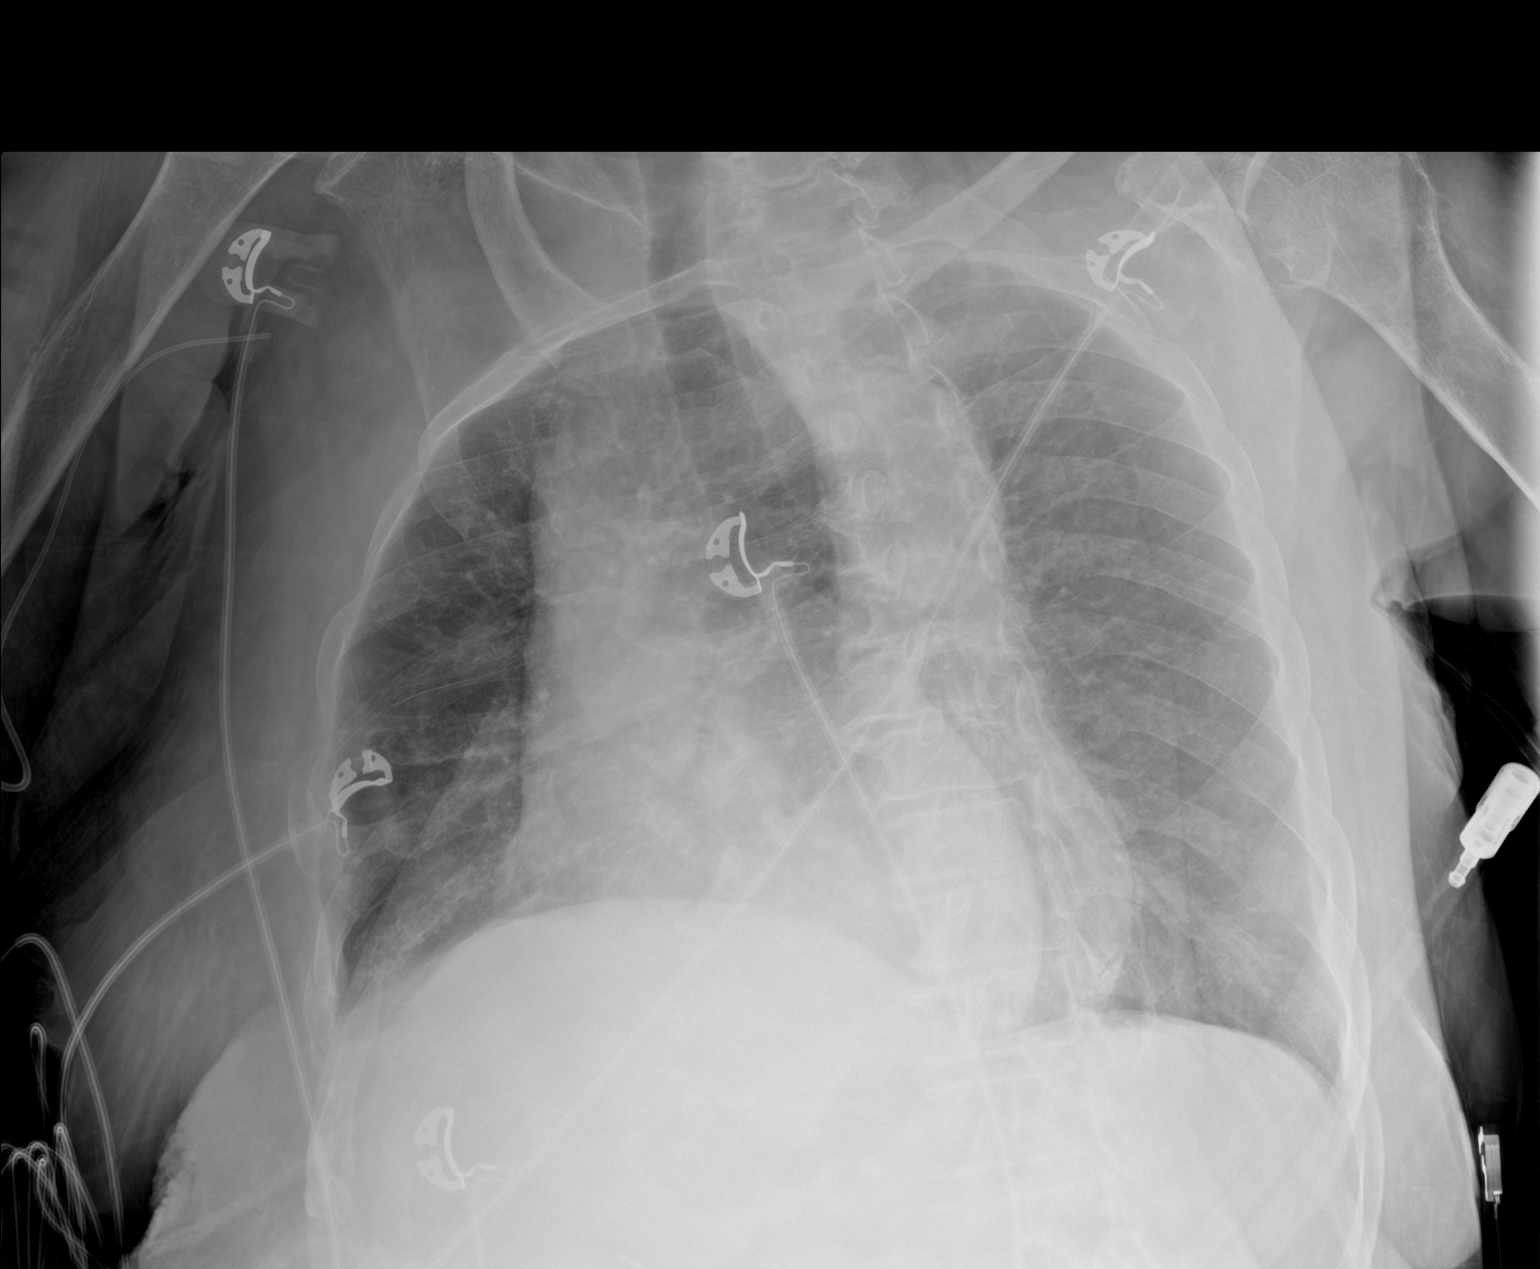

[1 of 1 positions shown; findings below may reference images not displayed]

FINDINGS: The patient is rotated. No focal consolidation, pleural effusion or
pneumothorax. Stable cardiomegaly. Degenerative changes of the
spine. No acute osseous pathology.
IMPRESSION: No acute cardiopulmonary process. Stable cardiomegaly.

## 2019-11-22 MED ORDER — VANCOMYCIN HCL 500 MG/100ML IV SOLN
500.0000 mg | Freq: Once | INTRAVENOUS | Status: AC
  Administered 2019-11-23: 500 mg via INTRAVENOUS
  Filled 2019-11-22: qty 100

## 2019-11-22 MED ORDER — VANCOMYCIN HCL IN DEXTROSE 1-5 GM/200ML-% IV SOLN
1000.0000 mg | Freq: Once | INTRAVENOUS | Status: AC
  Administered 2019-11-23: 1000 mg via INTRAVENOUS
  Filled 2019-11-22: qty 200

## 2019-11-22 MED ORDER — DEXTROSE 50 % IV SOLN: 25.0000 mL | Freq: Once | INTRAVENOUS | Status: AC

## 2019-11-22 MED ORDER — DEXTROSE 50 % IV SOLN
INTRAVENOUS | Status: AC
  Administered 2019-11-22: 25 mL via INTRAVENOUS
  Filled 2019-11-22: qty 50

## 2019-11-22 MED ORDER — SODIUM CHLORIDE 0.9 % IV SOLN
1.0000 g | Freq: Once | INTRAVENOUS | Status: AC
  Administered 2019-11-22: 1 g via INTRAVENOUS
  Filled 2019-11-22: qty 1

## 2019-11-22 MED ORDER — LACTATED RINGERS IV BOLUS (SEPSIS)
1000.0000 mL | Freq: Once | INTRAVENOUS | Status: AC
  Administered 2019-11-22: 1000 mL via INTRAVENOUS

## 2019-11-22 NOTE — ED Notes (Signed)
EDP at bedside, pt reports "Brianna Woods" when asked her name, pt still in trendelenburg from 2215  Last CBG of 118 reported

## 2019-11-22 NOTE — ED Notes (Signed)
In and out performed with Marcie Bal, EDT, D50 admin'd, Bair Hugger on pt

## 2019-11-22 NOTE — ED Triage Notes (Signed)
Per EMS report, patient has had altered mental status for 3 days and decrease in PO intake, interaction with staff and ambulation. Patient was sent to ED for critical labs values. Sodium 164, potassium 2.8, Chloride 122.

## 2019-11-22 NOTE — ED Provider Notes (Signed)
[facility] Emergency Department Provider Note  ____________________________________________   First MD Initiated Contact with Patient 11/22/19 2206     (approximate)  I have reviewed the triage vital signs and the nursing notes.  History  Chief Complaint AMS Abnormal Labs   HPI Brianna Woods is a 85 y.o. female with hx of dementia , GERD, hypothyroidism, HTN, who presents from her ALF for AMS and abnormal labs.   Per report, normally she is ambulatory and somewhat disruptive. Over the last few days this has not been the case. Has had decreased PO intake as well. Labs done at her facility revealed hypernatremia, hypokalemia, hyperchloremia.   Glucose 62 on arrival.   Caveat: hx limited due to patient's AMS. Provided by paperwork and EMS.   Past Medical Hx Past Medical History:  Diagnosis Date  . Anemia   . Arthritis   . Asthma    per pt  . Atherosclerosis of abdominal aorta ([place]) 07/2015   by CT  . Barrett's esophagus    on EGD 2008, EGD WNL 2013  . CAD (coronary artery disease) 07/2015   of LAD by CT  . Diverticulosis 2014   sigmoid on colonoscopy  . Fatty pancreas 07/2015   by CT  . Fibromyalgia    per pt, no records of this   . GERD (gastroesophageal reflux disease)    and esoph stricture s/p dilation 2008 HH by CT 2016  . Glaucoma   . History of anxiety    was on prozac then effexor (pt denies h/o anxiety/depression)  . History of cardiac murmur   . History of chicken pox   . History of CVA (cerebrovascular accident) 2008   "I've had several TIAs"  . History of right bundle branch block 2011  . History of ulcer disease    per pt, no records of this  . HLD (hyperlipidemia)   . HTN (hypertension)   . Kidney cyst, acquired 07/2015   by CT, rec rpt renal MRI in 6 months  . Mixed incontinence    on ditropan  . Nephrolithiasis    s/p surgery R kidney (64mm) 11/2009  . Nodular goiter, toxic or with hyperthyroidism    h/o toxic, on  methimazole, known large left thyroid nodule 08/2013 s/p beign biopsy per patient 2010, saw Dr. Gabriel Carina, Rad I ablation 04/2014  . Vitamin D deficiency     Problem List Patient Active Problem List   Diagnosis Date Noted  . Somnolence 10/13/2019  . Low back pain 02/11/2019  . Acquired hypothyroidism 02/11/2019  . Injury of face 11/21/2018  . MDD (major depressive disorder), single episode, moderate ([place]) 07/09/2018  . Glaucoma   . Osteopenia 06/09/2018  . Hiatal hernia 06/09/2018  . Diverticulosis 06/09/2018  . Weight loss 05/23/2018  . Dysphagia 05/23/2018  . Insomnia 05/23/2018  . Uterine mass 05/23/2018  . Tricompartment osteoarthritis of right knee 02/13/2018  . Skin rash 04/21/2016  . Increased endometrial stripe thickness 10/21/2015  . Nephrolithiasis   . Pedal edema 09/25/2015  . Generalized abdominal pain 07/26/2015  . Kidney cyst, acquired 07/13/2015  . CAD (coronary artery disease) 07/13/2015  . Atherosclerosis of abdominal aorta ([place]) 07/13/2015  . Fatty pancreas 07/13/2015  . Health maintenance examination 03/22/2015  . Intertrigo 03/22/2015  . Advanced care planning/counseling discussion 09/21/2014  . Medicare annual wellness visit, subsequent 03/20/2014  . Urinary incontinence 03/09/2014  . Chest discomfort 08/15/2013  . Anxiety attack 05/18/2012  . History of right bundle branch block   .  Vitamin D deficiency 02/10/2012  . Dizziness 01/22/2012  . Fibromyalgia   . History of CVA (cerebrovascular accident)   . Asthma   . Nodular goiter, toxic or with hyperthyroidism   . GERD (gastroesophageal reflux disease)   . Mixed incontinence   . HTN (hypertension)   . HLD (hyperlipidemia)     Past Surgical Hx Past Surgical History:  Procedure Laterality Date  . CATARACT EXTRACTION  1990   w/ implants  . CHOLECYSTECTOMY  2006  . COLONOSCOPY  10/02/12   diverticulosis, o/w WNL ([place])  . dexa     no records received  . ESOPHAGOGASTRODUODENOSCOPY  11/2011   LA grade  A esophagitis lower 1/3, dilated, med HH, o/w WNL - path: + GERD, no barrett's - f/u 11/2016  . LITHOTRIPSY Right 2011  . thyroid uptake scan  04/2014   unifrm uptake, enlarged thyroid consistent with grave's dz    Medications Prior to Admission medications   Medication Sig Start Date End Date Taking? Authorizing Provider  atorvastatin (LIPITOR) 40 MG tablet TAKE 1 TABLET ON MONDAY, [place] Patient taking differently: Take 40 mg by mouth every Monday, Wednesday, and Friday.  07/29/19   Ria Bush, MD  esomeprazole (NEXIUM) 40 MG capsule TAKE 1 CAPSULE EVERY DAY AT 12 NOON Patient taking differently: Take 40 mg by mouth daily.  07/29/19   Ria Bush, MD  famotidine (PEPCID) 40 MG tablet TAKE 1 TABLET (40 MG TOTAL) BY MOUTH AT BEDTIME (REPLACES ZANTAC) Patient taking differently: Take 40 mg by mouth at bedtime.  09/24/19   Ria Bush, MD  gabapentin (NEURONTIN) 300 MG capsule Take 1 capsule (300 mg total) by mouth at bedtime. 10/13/19   Ria Bush, MD  levothyroxine (SYNTHROID) 25 MCG tablet Take 25 mcg by mouth daily before breakfast.    [provider]  levothyroxine (SYNTHROID) 50 MCG tablet Take 50 mcg by mouth daily before breakfast.    [provider]  losartan (COZAAR) 100 MG tablet Take 1 tablet (100 mg total) by mouth daily. 10/13/19   Ria Bush, MD  traZODone (DESYREL) 100 MG tablet TAKE 1/2 TO 1 TABLET AT BEDTIME Patient taking differently: Take 50-100 mg by mouth at bedtime.  09/26/19   Ria Bush, MD  vitamin C (ASCORBIC ACID) 500 MG tablet Take 500 mg by mouth daily.    [provider]  pirbuterol (MAXAIR) 200 MCG/INH inhaler Inhale 2 puffs into the lungs 4 (four) times daily. 09/28/11 01/22/12  Ria Bush, MD    Allergies Ivp dye [iodinated diagnostic agents], Penicillins, and Statins  Family Hx Family History  Problem Relation Age of Onset  . Hyperlipidemia Mother   . Hypertension Mother   .  Stroke Father        hemorrhage  . Aneurysm Father 78       deceased  . Other Brother        TB  . Coronary artery disease Brother   . Aneurysm Son 1       sudden death  . Diabetes Maternal Aunt   . Cancer Brother 25       lung    Social Hx Social History   Tobacco Use  . Smoking status: Never Smoker  . Smokeless tobacco: Never Used  Substance Use Topics  . Alcohol use: No    Alcohol/week: 0.0 standard drinks  . Drug use: No     Review of Systems Unable to obtain due to patient's AMS   Physical Exam  Vital Signs:  ED Triage Vitals  Enc Vitals Group     BP 11/22/19 2151 (!) 120/52     Pulse Rate 11/22/19 2151 (!) 58     Resp 11/22/19 2151 16     Temp 11/22/19 2151 (!) 91.9 F (33.3 C)     Temp Source 11/22/19 2151 Rectal     SpO2 11/22/19 2151 100 %     Weight 11/22/19 2157 138 lb 0.1 oz (62.6 kg)     Height 11/22/19 2251 5\' 2"  (1.575 m)     Head Circumference --      Peak Flow --      Pain Score --      Pain Loc --      Pain Edu? --      Excl. in [place]? --     Constitutional: Awake, opens eyes to physical stimuli. Otherwise altered and does not answer questions. Moves all extremities. Protecting airway.  Head: Normocephalic. Atraumatic. Eyes: Conjunctivae clear, sclera anicteric. Pupils equal and symmetric. Nose: No masses or lesions. No congestion or rhinorrhea. Mouth/Throat: MM extremely dry. Edentulous.  Neck: No stridor. Trachea midline.  Cardiovascular: Mild bradycardia, regular rhythm. Extremities well perfused. Respiratory: Normal respiratory effort.  Lungs CTAB. Gastrointestinal: Soft. Non-distended. Non-tender.  Genitourinary: Deferred. Musculoskeletal: No lower extremity edema. No deformities. Neurologic: Demented, altered. Protecting airway. Moves all extremities equally.  Skin: Skin is dry. Psychiatric: Unable to assess due to AMS.  EKG  Personally reviewed and interpreted by myself.   Rate: 57 Rhythm: sinus Axis: normal Intervals:  RBBB, prolonged QTc Sinus brady, RBBB (seen prior), no acute ischemic changes No STEMI    Radiology  CXR  IMPRESSION:  No acute cardiopulmonary process. Stable cardiomegaly.   CT head, pending   Procedures  Procedure(s) performed (including critical care):  .Critical Care Performed by: Lilia Pro., MD Authorized by: Lilia Pro., MD   Critical care provider statement:    Critical care time (minutes):  40   Critical care was time spent personally by me on the following activities:  Discussions with consultants, evaluation of patient's response to treatment, examination of patient, ordering and performing treatments and interventions, ordering and review of laboratory studies, ordering and review of radiographic studies, pulse oximetry, re-evaluation of patient's condition, obtaining history from patient or surrogate and review of old charts     Initial Impression / Assessment and Plan / MDM / ED Course  85 y.o. female who presents to the ED for AMS and abnormal labs, as above.   On arrival she is notably hypothermic and hypotensive. Will initiate sepsis work up & empiric antibiotics. Will start with 1 L LR bolus and then reassess electrolytes prior to further fluid resuscitation to avoid any over or rapid correction in setting of hypernatremia.   Ddx: hypernatremia & other electrolyte abnormalities, hypoglycemia, dehydration, infection, thyroid disease, amongst others. Less likely adrenal insufficiency as she is hyperNa and hypoK.  Will obtain labs (including sepsis work up), imaging, empiric abx, fluids, as above.   Work up thus far reveals Na 157, K 2.4, chloride 120, creatinine 1.15. Added on magnesium, Depakote level (on her MAR for her dementia). UA negative for infection. Baseline anemia. Negative procalcitonin.   Patient care transferred to oncoming provider due to shift change, awaiting remainder of work up, anticipate admission.    _______________________________  As part of my medical decision making I have reviewed available labs, radiology tests, reviewed old records.  Final Clinical Impression(s) / ED Diagnosis  Final diagnoses:  Encephalopathy  Hypernatremia  Hypothermia, initial encounter       Note:  This document was prepared using Dragon voice recognition software and may include unintentional dictation errors.   Lilia Pro., MD 11/23/19 [phone]

## 2019-11-22 NOTE — ED Notes (Signed)
CBG reported to Dr Joan Mayans, orders rx'd

## 2019-11-22 NOTE — ED Notes (Signed)
Pt raised head and opened eyes but wouldn't respond to speech or track eye movement

## 2019-11-23 ENCOUNTER — Emergency Department: Payer: Medicare HMO

## 2019-11-23 DIAGNOSIS — G9340 Encephalopathy, unspecified: Secondary | ICD-10-CM | POA: Diagnosis not present

## 2019-11-23 DIAGNOSIS — F0390 Unspecified dementia without behavioral disturbance: Secondary | ICD-10-CM | POA: Diagnosis present

## 2019-11-23 DIAGNOSIS — T68XXXA Hypothermia, initial encounter: Secondary | ICD-10-CM | POA: Diagnosis not present

## 2019-11-23 DIAGNOSIS — R451 Restlessness and agitation: Secondary | ICD-10-CM | POA: Diagnosis not present

## 2019-11-23 DIAGNOSIS — G9341 Metabolic encephalopathy: Secondary | ICD-10-CM | POA: Diagnosis present

## 2019-11-23 DIAGNOSIS — E559 Vitamin D deficiency, unspecified: Secondary | ICD-10-CM | POA: Diagnosis present

## 2019-11-23 DIAGNOSIS — E871 Hypo-osmolality and hyponatremia: Secondary | ICD-10-CM | POA: Diagnosis not present

## 2019-11-23 DIAGNOSIS — I2510 Atherosclerotic heart disease of native coronary artery without angina pectoris: Secondary | ICD-10-CM | POA: Diagnosis present

## 2019-11-23 DIAGNOSIS — E43 Unspecified severe protein-calorie malnutrition: Secondary | ICD-10-CM | POA: Diagnosis present

## 2019-11-23 DIAGNOSIS — I119 Hypertensive heart disease without heart failure: Secondary | ICD-10-CM | POA: Diagnosis present

## 2019-11-23 DIAGNOSIS — E039 Hypothyroidism, unspecified: Secondary | ICD-10-CM | POA: Diagnosis present

## 2019-11-23 DIAGNOSIS — R279 Unspecified lack of coordination: Secondary | ICD-10-CM | POA: Diagnosis not present

## 2019-11-23 DIAGNOSIS — Z515 Encounter for palliative care: Secondary | ICD-10-CM | POA: Diagnosis present

## 2019-11-23 DIAGNOSIS — K219 Gastro-esophageal reflux disease without esophagitis: Secondary | ICD-10-CM | POA: Diagnosis present

## 2019-11-23 DIAGNOSIS — R4182 Altered mental status, unspecified: Secondary | ICD-10-CM | POA: Diagnosis present

## 2019-11-23 DIAGNOSIS — E870 Hyperosmolality and hypernatremia: Secondary | ICD-10-CM | POA: Diagnosis present

## 2019-11-23 DIAGNOSIS — E11649 Type 2 diabetes mellitus with hypoglycemia without coma: Secondary | ICD-10-CM | POA: Diagnosis not present

## 2019-11-23 DIAGNOSIS — E876 Hypokalemia: Secondary | ICD-10-CM | POA: Diagnosis present

## 2019-11-23 DIAGNOSIS — E162 Hypoglycemia, unspecified: Secondary | ICD-10-CM | POA: Diagnosis present

## 2019-11-23 DIAGNOSIS — Z20822 Contact with and (suspected) exposure to covid-19: Secondary | ICD-10-CM | POA: Diagnosis present

## 2019-11-23 DIAGNOSIS — Z66 Do not resuscitate: Secondary | ICD-10-CM | POA: Diagnosis present

## 2019-11-23 DIAGNOSIS — E878 Other disorders of electrolyte and fluid balance, not elsewhere classified: Secondary | ICD-10-CM | POA: Diagnosis present

## 2019-11-23 DIAGNOSIS — D696 Thrombocytopenia, unspecified: Secondary | ICD-10-CM | POA: Diagnosis present

## 2019-11-23 DIAGNOSIS — I959 Hypotension, unspecified: Secondary | ICD-10-CM | POA: Diagnosis not present

## 2019-11-23 DIAGNOSIS — R627 Adult failure to thrive: Secondary | ICD-10-CM | POA: Diagnosis present

## 2019-11-23 DIAGNOSIS — Z743 Need for continuous supervision: Secondary | ICD-10-CM | POA: Diagnosis not present

## 2019-11-23 DIAGNOSIS — F3289 Other specified depressive episodes: Secondary | ICD-10-CM | POA: Diagnosis not present

## 2019-11-23 DIAGNOSIS — E785 Hyperlipidemia, unspecified: Secondary | ICD-10-CM | POA: Diagnosis present

## 2019-11-23 DIAGNOSIS — G4700 Insomnia, unspecified: Secondary | ICD-10-CM | POA: Diagnosis not present

## 2019-11-23 DIAGNOSIS — A419 Sepsis, unspecified organism: Secondary | ICD-10-CM | POA: Diagnosis present

## 2019-11-23 DIAGNOSIS — Z6825 Body mass index (BMI) 25.0-25.9, adult: Secondary | ICD-10-CM | POA: Diagnosis not present

## 2019-11-23 DIAGNOSIS — E538 Deficiency of other specified B group vitamins: Secondary | ICD-10-CM | POA: Diagnosis present

## 2019-11-23 DIAGNOSIS — D649 Anemia, unspecified: Secondary | ICD-10-CM | POA: Diagnosis present

## 2019-11-23 DIAGNOSIS — G629 Polyneuropathy, unspecified: Secondary | ICD-10-CM | POA: Diagnosis present

## 2019-11-23 DIAGNOSIS — Z7189 Other specified counseling: Secondary | ICD-10-CM | POA: Diagnosis not present

## 2019-11-23 LAB — BASIC METABOLIC PANEL
Anion gap: 9 (ref 5–15)
Anion gap: 6 (ref 5–15)
Anion gap: 8 (ref 5–15)
BUN: 33 mg/dL — ABNORMAL HIGH (ref 8–23)
BUN: 31 mg/dL — ABNORMAL HIGH (ref 8–23)
BUN: 26 mg/dL — ABNORMAL HIGH (ref 8–23)
CO2: 27 mmol/L (ref 22–32)
CO2: 27 mmol/L (ref 22–32)
CO2: 24 mmol/L (ref 22–32)
Calcium: 8.5 mg/dL — ABNORMAL LOW (ref 8.9–10.3)
Calcium: 8.4 mg/dL — ABNORMAL LOW (ref 8.9–10.3)
Calcium: 8.4 mg/dL — ABNORMAL LOW (ref 8.9–10.3)
Chloride: 123 mmol/L — ABNORMAL HIGH (ref 98–111)
Chloride: 124 mmol/L — ABNORMAL HIGH (ref 98–111)
Chloride: 123 mmol/L — ABNORMAL HIGH (ref 98–111)
Creatinine, Ser: 1.12 mg/dL — ABNORMAL HIGH (ref 0.44–1.00)
Creatinine, Ser: 1 mg/dL (ref 0.44–1.00)
Creatinine, Ser: 0.98 mg/dL (ref 0.44–1.00)
GFR calc Af Amer: 53 mL/min — ABNORMAL LOW (ref >60)
GFR calc Af Amer: >60 mL/min (ref >60)
GFR calc Af Amer: >60 mL/min (ref >60)
GFR calc non Af Amer: 46 mL/min — ABNORMAL LOW (ref >60)
GFR calc non Af Amer: 52 mL/min — ABNORMAL LOW (ref >60)
GFR calc non Af Amer: 54 mL/min — ABNORMAL LOW (ref >60)
Glucose, Bld: 75 mg/dL (ref 70–99)
Glucose, Bld: 87 mg/dL (ref 70–99)
Glucose, Bld: 58 mg/dL — ABNORMAL LOW (ref 70–99)
Potassium: 2.6 mmol/L — CL (ref 3.5–5.1)
Potassium: 2.8 mmol/L — ABNORMAL LOW (ref 3.5–5.1)
Potassium: 3.4 mmol/L — ABNORMAL LOW (ref 3.5–5.1)
Sodium: 159 mmol/L — ABNORMAL HIGH (ref 135–145)
Sodium: 157 mmol/L — ABNORMAL HIGH (ref 135–145)
Sodium: 155 mmol/L — ABNORMAL HIGH (ref 135–145)

## 2019-11-23 LAB — CBC
HCT: 29.4 % — ABNORMAL LOW (ref 36.0–46.0)
Hemoglobin: 8.9 g/dL — ABNORMAL LOW (ref 12.0–15.0)
MCH: 31.1 pg (ref 26.0–34.0)
MCHC: 30.3 g/dL (ref 30.0–36.0)
MCV: 102.8 fL — ABNORMAL HIGH (ref 80.0–100.0)
Platelets: 65 10*3/uL — ABNORMAL LOW (ref 150–400)
RBC: 2.86 MIL/uL — ABNORMAL LOW (ref 3.87–5.11)
RDW: 16.6 % — ABNORMAL HIGH (ref 11.5–15.5)
WBC: 6.5 10*3/uL (ref 4.0–10.5)
nRBC: 1.9 % — ABNORMAL HIGH (ref 0.0–0.2)

## 2019-11-23 LAB — CORTISOL: Cortisol, Plasma: 18.1 ug/dL

## 2019-11-23 LAB — GLUCOSE, CAPILLARY
Glucose-Capillary: 130 mg/dL — ABNORMAL HIGH (ref 70–99)
Glucose-Capillary: 133 mg/dL — ABNORMAL HIGH (ref 70–99)
Glucose-Capillary: 65 mg/dL — ABNORMAL LOW (ref 70–99)
Glucose-Capillary: 81 mg/dL (ref 70–99)

## 2019-11-23 LAB — TSH: TSH: 6.814 u[IU]/mL — ABNORMAL HIGH (ref 0.350–4.500)

## 2019-11-23 LAB — PROTIME-INR
INR: 1.1 (ref 0.8–1.2)
Prothrombin Time: 14.5 seconds (ref 11.4–15.2)

## 2019-11-23 LAB — RESPIRATORY PANEL BY RT PCR (FLU A&B, COVID)
Influenza A by PCR: NEGATIVE
Influenza B by PCR: NEGATIVE
SARS Coronavirus 2 by RT PCR: NEGATIVE

## 2019-11-23 LAB — CORTISOL-AM, BLOOD: Cortisol - AM: 17.3 ug/dL (ref 6.7–22.6)

## 2019-11-23 LAB — MAGNESIUM: Magnesium: 2.2 mg/dL (ref 1.7–2.4)

## 2019-11-23 LAB — PROCALCITONIN: Procalcitonin: <0.1 ng/mL

## 2019-11-23 LAB — T4, FREE: Free T4: 1.03 ng/dL (ref 0.61–1.12)

## 2019-11-23 LAB — VALPROIC ACID LEVEL: Valproic Acid Lvl: 64 ug/mL (ref 50.0–100.0)

## 2019-11-23 IMAGING — CT CT HEAD W/O CM
3 of 6 series · 16 of 47 positions shown, 19 images · non-contrast
Comparison: Head CT dated [DATE].

CLINICAL DATA: 84-year-old female with altered mental status.

EXAM:
CT HEAD WITHOUT CONTRAST
TECHNIQUE: Contiguous axial images were obtained from the base of the skull
through the vertex without intravenous contrast.

[Series 2: head wo · axial · 0.41mm/px · z∈[+137,+262]mm · 11 of 31 slices shown, 14 images]
[im 3/31  brain]
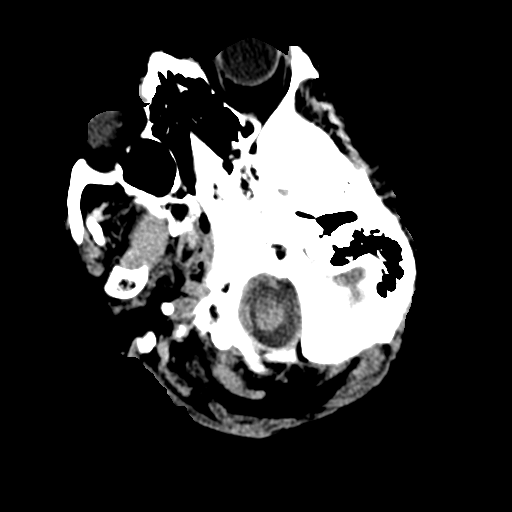
[im 3/31  bone]
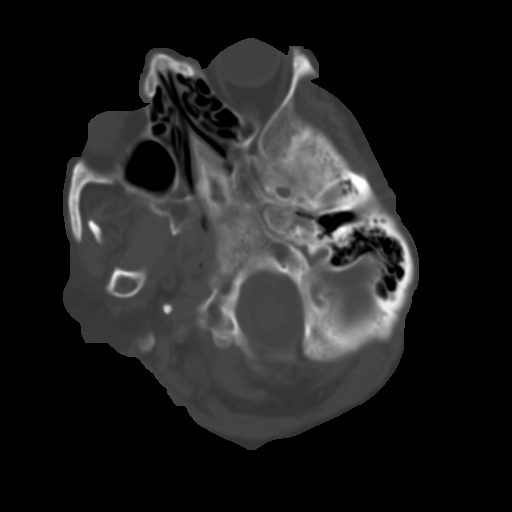
[im 5/31  brain]
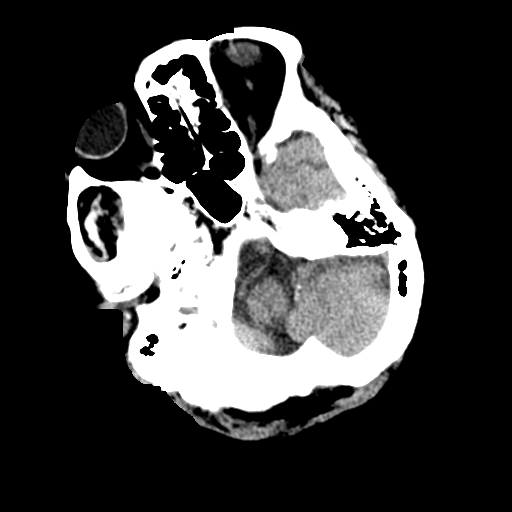
[im 7/31  brain]
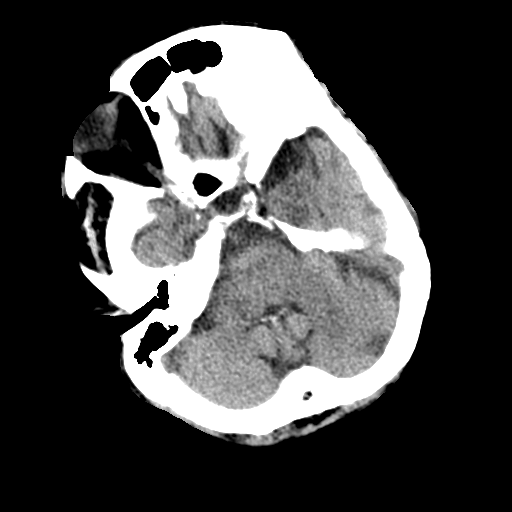
[im 11/31  brain]
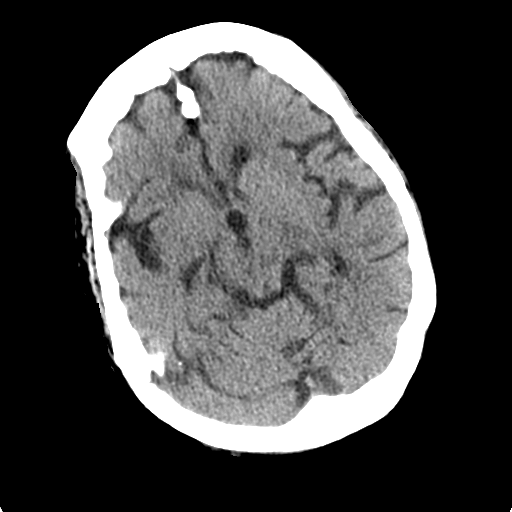
[im 13/31  brain]
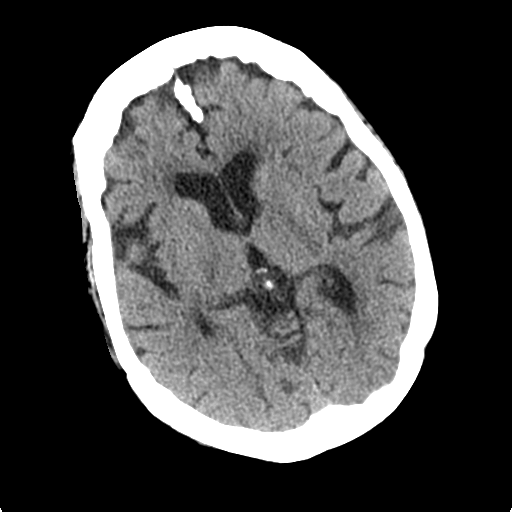
[im 13/31  bone]
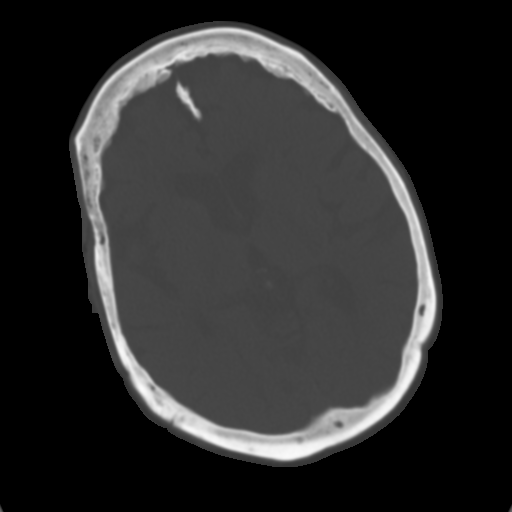
[im 16/31  brain]
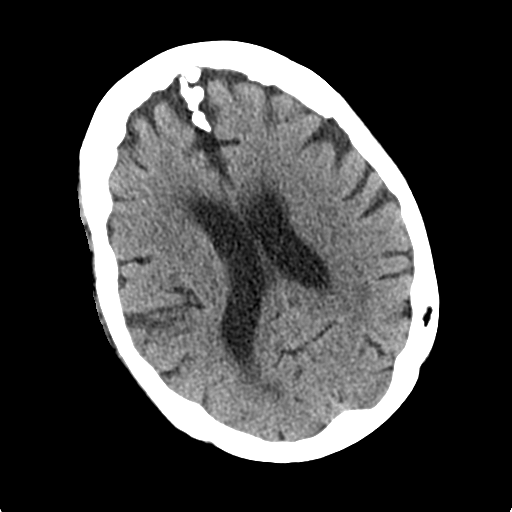
[im 18/31  brain]
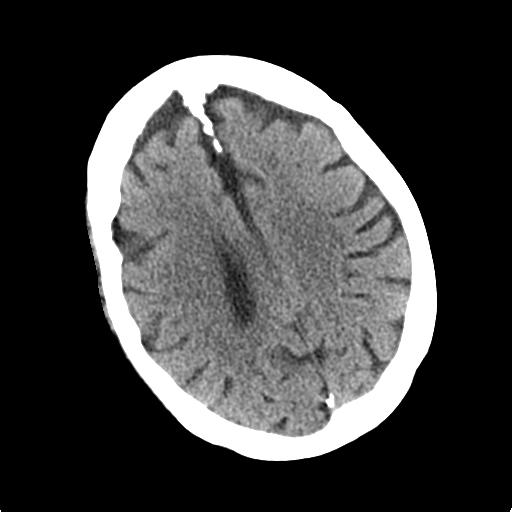
[im 20/31  brain]
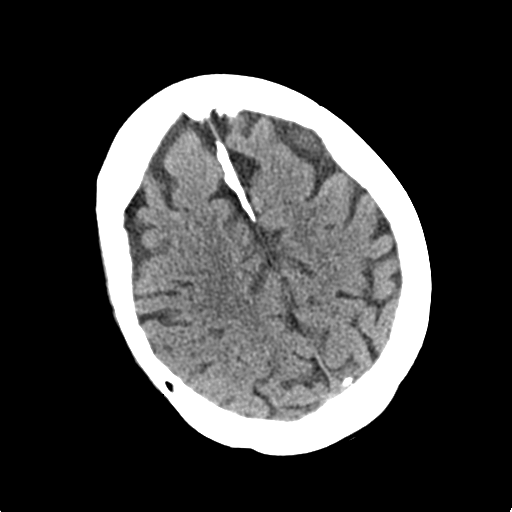
[im 24/31  brain]
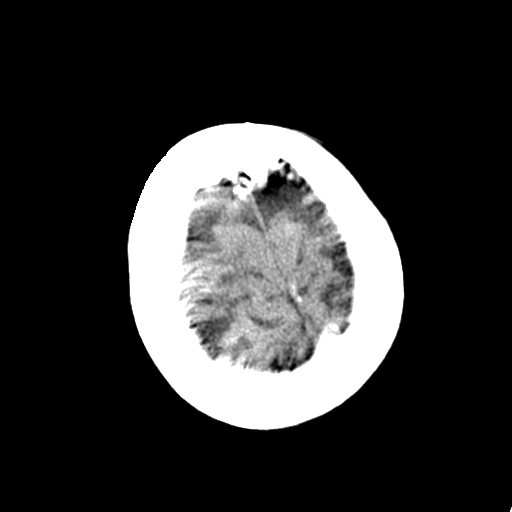
[im 24/31  bone]
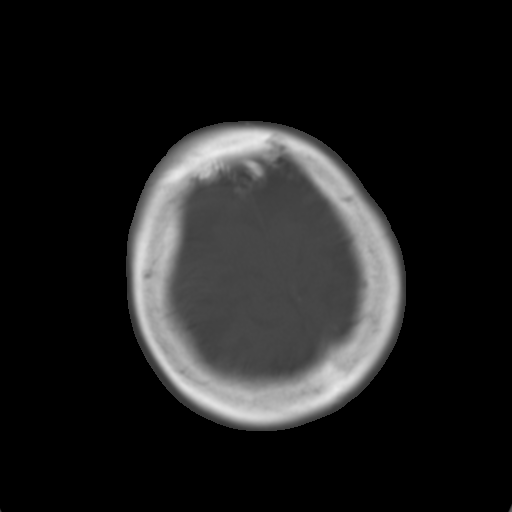
[im 26/31  brain]
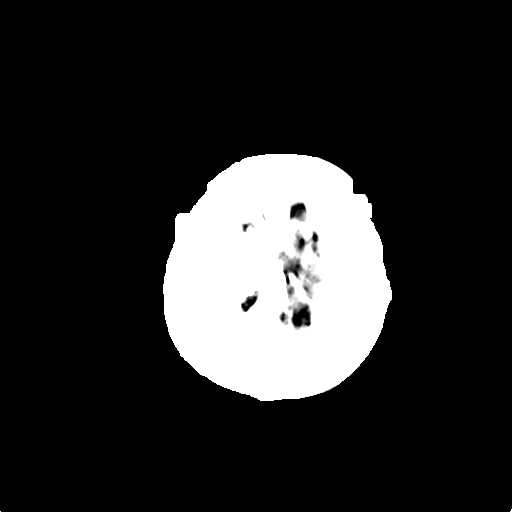
[im 28/31  brain]
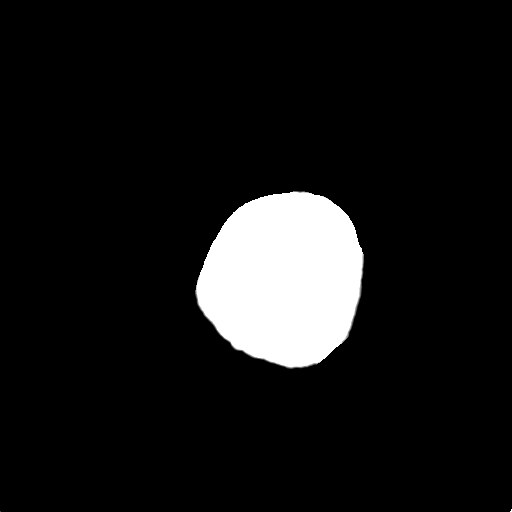

[Series 4: coronal soft tissue · coronal · 0.28mm/px · 3 of 64 slices shown]
[im 16/64  brain]
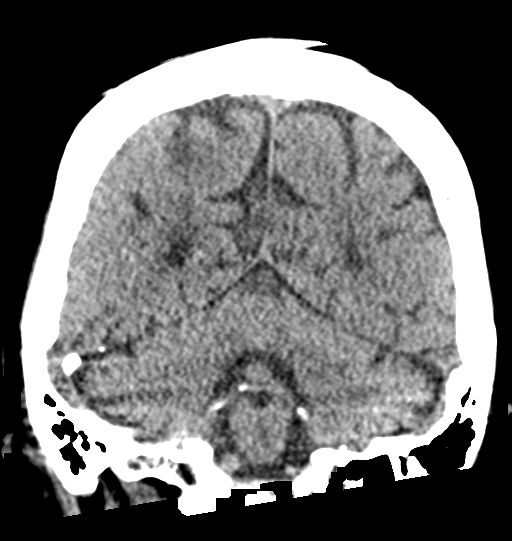
[im 32/64  brain]
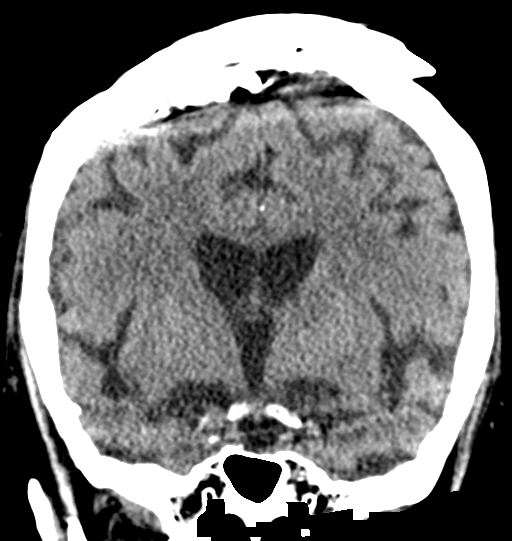
[im 48/64  brain]
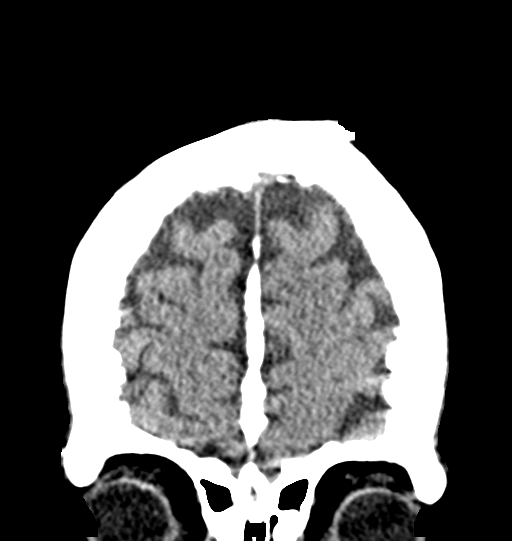

[Series 9: sagittal soft tissue · sagittal · 0.29mm/px · 2 of 48 slices shown]
[im 16/48  brain]
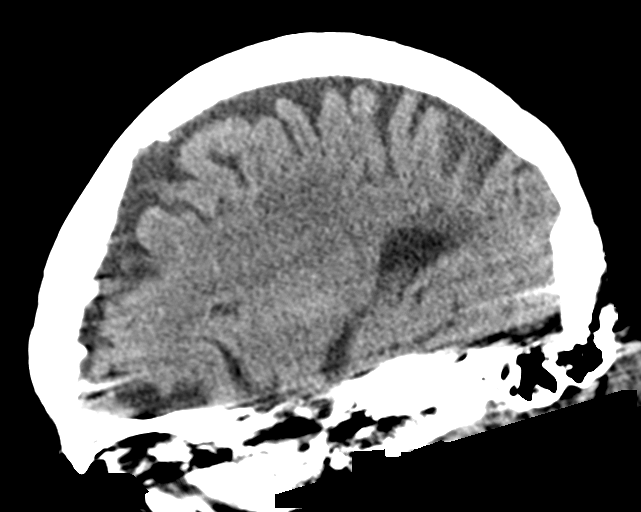
[im 32/48  brain]
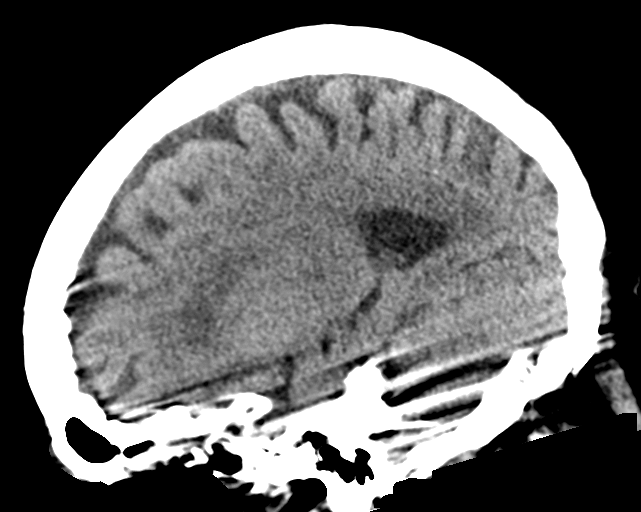

[16 of 47 positions shown; findings below may reference images not displayed]

FINDINGS: Evaluation of this exam is limited due to motion artifact.

Brain: There is mild age-related atrophy and chronic microvascular
ischemic changes. Old left inferior frontal infarct and
encephalomalacia. There is no acute intracranial hemorrhage. No mass
effect or midline shift. No extra-axial fluid collection.

Vascular: No hyperdense vessel or unexpected calcification.

Skull: Normal. Negative for fracture or focal lesion.

Sinuses/Orbits: No acute finding.

Other: None
IMPRESSION: 1. No acute intracranial pathology.
2. Age-related atrophy and chronic microvascular ischemic changes.
Old left inferior frontal infarct and encephalomalacia.

## 2019-11-23 MED ORDER — POTASSIUM CHLORIDE 2 MEQ/ML IV SOLN: INTRAVENOUS | Status: DC

## 2019-11-23 MED ORDER — METRONIDAZOLE IN NACL 5-0.79 MG/ML-% IV SOLN
500.0000 mg | Freq: Three times a day (TID) | INTRAVENOUS | Status: DC
  Administered 2019-11-23 (×2): 500 mg via INTRAVENOUS
  Filled 2019-11-23 (×3): qty 100

## 2019-11-23 MED ORDER — SODIUM CHLORIDE 0.9 % IV SOLN
2.0000 g | Freq: Two times a day (BID) | INTRAVENOUS | Status: DC
  Administered 2019-11-23 – 2019-11-26 (×7): 2 g via INTRAVENOUS
  Filled 2019-11-23 (×9): qty 2

## 2019-11-23 MED ORDER — POTASSIUM CHLORIDE 10 MEQ/100ML IV SOLN
10.0000 meq | INTRAVENOUS | Status: AC
  Administered 2019-11-23 (×3): 10 meq via INTRAVENOUS
  Filled 2019-11-23 (×3): qty 100

## 2019-11-23 MED ORDER — DEXTROSE-NACL 5-0.9 % IV SOLN: INTRAVENOUS | Status: DC

## 2019-11-23 MED ORDER — ACETAMINOPHEN 325 MG PO TABS: 650.0000 mg | ORAL_TABLET | Freq: Four times a day (QID) | ORAL | Status: DC | PRN

## 2019-11-23 MED ORDER — ONDANSETRON HCL 4 MG/2ML IJ SOLN: 4.0000 mg | Freq: Four times a day (QID) | INTRAMUSCULAR | Status: DC | PRN

## 2019-11-23 MED ORDER — DIVALPROEX SODIUM 125 MG PO CSDR
250.0000 mg | DELAYED_RELEASE_CAPSULE | Freq: Two times a day (BID) | ORAL | Status: DC
  Administered 2019-11-27 – 2019-12-01 (×7): 250 mg via ORAL
  Filled 2019-11-23 (×18): qty 2

## 2019-11-23 MED ORDER — LEVOTHYROXINE SODIUM 100 MCG/5ML IV SOLN
12.5000 ug | Freq: Every morning | INTRAVENOUS | Status: DC
  Administered 2019-11-24 – 2019-11-29 (×6): 12.5 ug via INTRAVENOUS
  Filled 2019-11-23 (×6): qty 5

## 2019-11-23 MED ORDER — DEXTROSE 50 % IV SOLN
12.5000 g | INTRAVENOUS | Status: AC
  Administered 2019-11-23: 16:00:00 12.5 g via INTRAVENOUS
  Filled 2019-11-23: qty 50

## 2019-11-23 MED ORDER — LOSARTAN POTASSIUM 50 MG PO TABS: 100.0000 mg | ORAL_TABLET | Freq: Every day | ORAL | Status: DC

## 2019-11-23 MED ORDER — ASCORBIC ACID 500 MG PO TABS
500.0000 mg | ORAL_TABLET | Freq: Every day | ORAL | Status: DC
  Administered 2019-11-29 – 2019-12-01 (×3): 500 mg via ORAL
  Filled 2019-11-23 (×3): qty 1

## 2019-11-23 MED ORDER — ENOXAPARIN SODIUM 40 MG/0.4ML ~~LOC~~ SOLN
40.0000 mg | SUBCUTANEOUS | Status: DC
  Administered 2019-11-23: 06:00:00 40 mg via SUBCUTANEOUS
  Filled 2019-11-23: qty 0.4

## 2019-11-23 MED ORDER — ACETAMINOPHEN 650 MG RE SUPP: 650.0000 mg | Freq: Four times a day (QID) | RECTAL | Status: DC | PRN

## 2019-11-23 MED ORDER — VANCOMYCIN HCL 750 MG/150ML IV SOLN
750.0000 mg | INTRAVENOUS | Status: DC
  Administered 2019-11-23: 750 mg via INTRAVENOUS
  Filled 2019-11-23: qty 150

## 2019-11-23 MED ORDER — CHLORHEXIDINE GLUCONATE CLOTH 2 % EX PADS
6.0000 | MEDICATED_PAD | Freq: Every day | CUTANEOUS | Status: DC
  Administered 2019-11-23 – 2019-12-01 (×9): 6 via TOPICAL

## 2019-11-23 MED ORDER — DEXTROSE 50 % IV SOLN
25.0000 mL | Freq: Once | INTRAVENOUS | Status: AC
  Administered 2019-11-23: 25 mL via INTRAVENOUS

## 2019-11-23 MED ORDER — LEVOTHYROXINE SODIUM 50 MCG PO TABS: 50.0000 ug | ORAL_TABLET | Freq: Every day | ORAL | Status: DC

## 2019-11-23 MED ORDER — GABAPENTIN 300 MG PO CAPS
300.0000 mg | ORAL_CAPSULE | Freq: Every day | ORAL | Status: DC
  Administered 2019-11-27 – 2019-11-30 (×4): 300 mg via ORAL
  Filled 2019-11-23 (×4): qty 1

## 2019-11-23 MED ORDER — KCL IN DEXTROSE-NACL 20-5-0.45 MEQ/L-%-% IV SOLN
INTRAVENOUS | Status: DC
  Filled 2019-11-23 (×3): qty 1000

## 2019-11-23 MED ORDER — POTASSIUM CHLORIDE 20 MEQ PO PACK: 40.0000 meq | PACK | Freq: Once | ORAL | Status: DC

## 2019-11-23 MED ORDER — METOPROLOL SUCCINATE ER 25 MG PO TB24
25.0000 mg | ORAL_TABLET | Freq: Two times a day (BID) | ORAL | Status: DC
  Administered 2019-11-27 – 2019-11-29 (×3): 25 mg via ORAL
  Filled 2019-11-23 (×5): qty 1

## 2019-11-23 MED ORDER — MAGNESIUM HYDROXIDE 400 MG/5ML PO SUSP
30.0000 mL | Freq: Every day | ORAL | Status: DC | PRN
  Filled 2019-11-23: qty 30

## 2019-11-23 MED ORDER — POTASSIUM CL IN DEXTROSE 5% 20 MEQ/L IV SOLN
20.0000 meq | INTRAVENOUS | Status: DC
  Administered 2019-11-23: 11:00:00 20 meq via INTRAVENOUS
  Filled 2019-11-23 (×3): qty 1000

## 2019-11-23 MED ORDER — LEVOTHYROXINE SODIUM 50 MCG PO TABS: 25.0000 ug | ORAL_TABLET | Freq: Every day | ORAL | Status: DC

## 2019-11-23 MED ORDER — ONDANSETRON HCL 4 MG PO TABS: 4.0000 mg | ORAL_TABLET | Freq: Four times a day (QID) | ORAL | Status: DC | PRN

## 2019-11-23 MED ORDER — POTASSIUM CHLORIDE 10 MEQ/100ML IV SOLN
10.0000 meq | INTRAVENOUS | Status: AC
  Administered 2019-11-23 (×4): 10 meq via INTRAVENOUS
  Filled 2019-11-23 (×5): qty 100

## 2019-11-23 MED ORDER — KETOROLAC TROMETHAMINE 15 MG/ML IJ SOLN
15.0000 mg | Freq: Four times a day (QID) | INTRAMUSCULAR | Status: AC | PRN
  Filled 2019-11-23 (×2): qty 1

## 2019-11-23 MED ORDER — PANTOPRAZOLE SODIUM 40 MG PO TBEC: 40.0000 mg | DELAYED_RELEASE_TABLET | Freq: Every day | ORAL | Status: DC

## 2019-11-23 MED ORDER — TRAZODONE HCL 50 MG PO TABS
50.0000 mg | ORAL_TABLET | Freq: Every day | ORAL | Status: DC
  Administered 2019-11-27 – 2019-11-30 (×4): 50 mg via ORAL
  Filled 2019-11-23 (×4): qty 1

## 2019-11-23 MED ORDER — TRAZODONE HCL 50 MG PO TABS: 25.0000 mg | ORAL_TABLET | Freq: Every evening | ORAL | Status: DC | PRN

## 2019-11-23 MED ORDER — POTASSIUM CHLORIDE 2 MEQ/ML IV SOLN
INTRAVENOUS | Status: DC
  Filled 2019-11-23 (×17): qty 1000

## 2019-11-23 MED ORDER — POTASSIUM CHLORIDE 20 MEQ/15ML (10%) PO SOLN: 20.0000 meq | Freq: Two times a day (BID) | ORAL | Status: DC

## 2019-11-23 MED ORDER — VANCOMYCIN HCL IN DEXTROSE 1-5 GM/200ML-% IV SOLN: 1000.0000 mg | Freq: Once | INTRAVENOUS | Status: DC

## 2019-11-23 MED ORDER — MORPHINE SULFATE (PF) 2 MG/ML IV SOLN
1.0000 mg | INTRAVENOUS | Status: DC | PRN
  Administered 2019-11-24 – 2019-12-01 (×2): 1 mg via INTRAVENOUS
  Filled 2019-11-23 (×2): qty 1

## 2019-11-23 MED ORDER — LORAZEPAM 1 MG PO TABS: 1.0000 mg | ORAL_TABLET | Freq: Every evening | ORAL | Status: DC | PRN

## 2019-11-23 MED ORDER — ATORVASTATIN CALCIUM 20 MG PO TABS
40.0000 mg | ORAL_TABLET | ORAL | Status: DC
  Administered 2019-12-01: 18:00:00 40 mg via ORAL
  Filled 2019-11-23 (×2): qty 2

## 2019-11-23 MED ORDER — FAMOTIDINE 20 MG PO TABS
40.0000 mg | ORAL_TABLET | Freq: Every day | ORAL | Status: DC
  Administered 2019-11-27 – 2019-11-30 (×4): 40 mg via ORAL
  Filled 2019-11-23 (×4): qty 2

## 2019-11-23 NOTE — Progress Notes (Addendum)
PHARMACY -  BRIEF ANTIBIOTIC NOTE   Pharmacy has received consult(s) for meropenem/vanc from an ED provider.  The patient's profile has been reviewed for ht/wt/allergies/indication/available labs.    One time order(s) placed for meropenem 1g IV x 1, vanc 1.5g IV load  Further antibiotics/pharmacy consults should be ordered by admitting physician if indicated.                       Thank you,  Tobie Lords, PharmD, BCPS Clinical Pharmacist 11/23/2019  1:07 AM

## 2019-11-23 NOTE — ED Notes (Addendum)
Pt back from CT att, tech reports pt picking at IV tape, coban added to secure sites  IV abx found att

## 2019-11-23 NOTE — Progress Notes (Signed)
Pt on Bair hugger. Antibiotic stopped d/t loss of IV access. Oncomming RN made ware stopped until IV access obtained

## 2019-11-23 NOTE — ED Notes (Signed)
First call report; per Jole: floor has not assigned RN to pt; call back charge at [phone]

## 2019-11-23 NOTE — Progress Notes (Signed)
CODE SEPSIS - PHARMACY COMMUNICATION  **Broad Spectrum Antibiotics should be administered within 1 hour of Sepsis diagnosis**  Time Code Sepsis Called/Page Received: [id number]  Antibiotics Ordered: meropenem/vanc initially, cefepime ordered later  Time of 1st antibiotic administration: 2243  Additional action taken by pharmacy:   If necessary, Name of Provider/Nurse Contacted:     Tobie Lords ,PharmD Clinical Pharmacist  11/23/2019  5:19 AM

## 2019-11-23 NOTE — ED Notes (Signed)
Phyillis, charge, to have primary RN Brandy call me back

## 2019-11-23 NOTE — ED Provider Notes (Signed)
-----------------------------------------   12:15 AM on 11/23/2019 -----------------------------------------  Assuming care from Dr. Joan Mayans.  In short, Brianna Woods is a 85 y.o. female with a chief complaint of AMS and hypothermia.  Refer to the original H&P for additional details.  The current plan of care is to follow up head CT and labs and admit to hospitalist.    ----------------------------------------- 1:20 AM on 11/23/2019 -----------------------------------------  Head CT report did not crossover in epic, but I was able to see the results when viewing the images and neither I nor the radiologist saw any acute abnormalities on the head CT.  TSH is elevated but free T4 is normal.  Magnesium level is normal, procalcitonin is normal.  Repeat basic metabolic panel after the initial LR bolus is pending.  I have consulted the hospitalist for admission.   ----------------------------------------- 3:41 AM on 11/23/2019 -----------------------------------------  (Delayed documentation due to multiple patients in the ED.) I discussed the case with Dr. Sidney Ace who will admit.  The patient had another episode of hypoglycemia found through point-of-care blood glucose testing and I authorized administration of another 1/2 ampoule of D50.  I also ordered D5 normal saline infusion both for the recurrent hypoglycemia as well as the hypernatremia which is likely secondary to volume depletion.  Repeat metabolic panel was notable for actually a worsening of the sodium to 159 and a very slight improvement of potassium from 2.4-2.6.  Magnesium level is normal.   Hinda Kehr, MD 11/23/19 [phone]

## 2019-11-23 NOTE — Progress Notes (Signed)
**Note De-Identified vi Obfusction** PROGRESS NOTE    Brianna Woods  TGY:[id number] DOB: [birth year] DOA: 11/22/2019 PCP: Ri Bush, MD   Brief Nrrtive:  Brianna Woods  is  85 y.o. [place] femle with  known history of multiple medicl problems tht re mentioned below, including sthm, coronry rtery disese, fibromylgi, hypertension, dyslipidemi, who presented to the emergency room with cute onset of ltered mentl sttus nd bnorml lbs t [facility].  She is mbultory t bseline over the lst few dys she not been ble to mbulte.  She is been hving significntly diminished ppetite nd decresed p.o. intke.  She hd lbs drwn reveled hyperntremi tht ws up to 172 nd hypoklemi s well s chloremi.  She ws lso hypoglycemic with  blood glucose of 44 tht ws up to 62 here on rrivl.  No reported nuse or vomiting or dirrhe or bdominl pin.  No reported chest pin dyspne or cough or wheezing.  The ptient ws nonverbl during my interview nd firly somnolent nd therefore no history could be obtined from her.  Upon presenttion to the emergency room, she ws hypothermic with  temperture of 91.9 nd blood pressure ws 120/52 with  pulse of 58 nd respirtory to 16 nd pulse ox 99% on room ir.  Blood pressure lter on ws down to 73/50 nd with hydrtion nd went up to 107/51 nd lter 149/66.  She ws plced on  wrming blnket nd temperture ws up to 93.9 so fr.  Lbs reveled hypoklemi of 2.4 with  sodium of 159 nd chloride 120, BUN of 5 nd cretinine 1.15.  Influenz ntigens nd COVID-19 PCR were negtive.  CBC showed thrombocytopeni of 75 nd nemi with hemoglobin of 9.7 hemtocrit of 32.9 comprble to previous levels.  Lctic cid ws 1.2 nd proclcitonin less thn 0.1.  Vlproic cid level 64.  TSH ws 6.8 nd free T4 ws norml t 1.03.  Portble chest ry showed stble crdiomegly with no cute crdiopulmonry disese.  Noncontrsted hed  CT scn showed ge-relted trophy nd chronic microvsculr ischemic chnges, old left inferior frontl infrct nd encephlomlci with no cute intrcrnil pthology.  The ptient ws given 1 L bolus of IV lctted Ringer, n mp of D50 nd lter D5 norml sline 100 mill per hour.  She ws lso given IV vncomycin nd meropenem.  She will be dmitted to  mediclly monitored bed for further evlution nd mngement.  Assessment & Pln:   Active Problems:   Altered mentl sttus  [place] of [place]:  Ptient seems to hve been declining over the pst severl weeks.  Moved in with son b/c not doing well independently, found down  few times.  She lived with son, but declined with him s well, flling sleep in converstions, etc.  Went to [facility].  Updted tht she wsn't eting or drinking, not cting herself, bnorml lbs nd trnsferred here. Discussed code sttus with son, Brianna Woods who noted tht he thinks she would wnt to be DNR Will c/s pllitive cre s it seems she's filing to thrive  1.  SIRS  Hypothermi  Hypotension  AMS  R/o Sepsis  - hypothermi improved fter bir hugger - follow AM cortisol, TSH 6.814 with norml free T4 - Continue empiric IV bx - UA does not pper c/w UTI, CXR without cute bnormlity - follow blood nd urine cultures - MRSA PCR - d/c flgyl.  Continue vnc/cefepime for now  # Hypothermi:  - follow cortisol, TSH 6.814 with norml free T4 - she's hypoglycemi, which  is likely contributing - improved after bair hugger  2.  Hypoglycemia. - Continue to monitor on D5 fluids - Q4 POC BG checks  # Hypernatremia  Hyperchloremia: 2/2 dehydration, follow on hypotonic fluids (with hyperchloremia, ? Related to NS administration as well?)  # Hypokalemia:  Replace and follow   4.  Hypothyroidism. -We will continue Synthroid.  5.  Hypertension. -We will hold off Cozaar and resume Toprol-XL with adequate BP.  6.  GERD. -We will continue PPI  therapy as well as H2 blocker.  7.  Peripheral neuropathy. -We will continue Neurontin.  8.  Dyslipidemia. -We will continue statin therapy.  # Anemia  Thrombocytopenia: thrombocytopenia is new, could be related to sepsis with hypothermia, continue to monitor  DVT prophylaxis: SCD Code Status: DNR Family Communication: son Disposition Plan:  . Patient came from: SNF            . Anticipated d/c place:SNF . Barriers to d/c OR conditions which need to be met to effect Brianna Woods safe d/c: pending improvement in electrolyte abnormalities and AMS, palliative care c/s   Consultants:   Palliative care  Procedures:   none  Antimicrobials:  Anti-infectives (From admission, onward)   Start     Dose/Rate Route Frequency Ordered Stop   11/23/19 2200  vancomycin (VANCOREADY) IVPB 750 mg/150 mL     750 mg 150 mL/hr over 60 Minutes Intravenous Every 24 hours 11/23/19 0838     11/23/19 0600  metroNIDAZOLE (FLAGYL) IVPB 500 mg  Status:  Discontinued     500 mg 100 mL/hr over 60 Minutes Intravenous Every 8 hours 11/23/19 0439 11/23/19 1258   11/23/19 0515  ceFEPIme (MAXIPIME) 2 g in sodium chloride 0.9 % 100 mL IVPB     2 g 200 mL/hr over 30 Minutes Intravenous Every 12 hours 11/23/19 0439     11/23/19 0445  vancomycin (VANCOCIN) IVPB 1000 mg/200 mL premix  Status:  Discontinued     1,000 mg 200 mL/hr over 60 Minutes Intravenous  Once 11/23/19 0439 11/23/19 0507   11/22/19 2330  vancomycin (VANCOCIN) IVPB 1000 mg/200 mL premix     1,000 mg 200 mL/hr over 60 Minutes Intravenous  Once 11/22/19 2324 11/23/19 0315   11/22/19 2330  vancomycin (VANCOREADY) IVPB 500 mg/100 mL     500 mg 100 mL/hr over 60 Minutes Intravenous  Once 11/22/19 2328 11/23/19 0513   11/22/19 2230  meropenem (MERREM) 1 g in sodium chloride 0.9 % 100 mL IVPB     1 g 200 mL/hr over 30 Minutes Intravenous  Once 11/22/19 2219 11/22/19 2313     Subjective: moans  Objective: Vitals:   11/23/19 0625 11/23/19 0800  11/23/19 0935 11/23/19 1120  BP:  (!) 127/45    Pulse:  65 69   Resp:  15 14   Temp: (!) 92.7 F (33.7 C) (!) 93.1 F (33.9 C) (!) 95.8 F (35.4 C) 98.3 F (36.8 C)  TempSrc: Rectal Rectal Rectal Axillary  SpO2:  100% 96%   Weight:      Height:        Intake/Output Summary (Last 24 hours) at 11/23/2019 1243 Last data filed at 11/23/2019 0517 Gross per 24 hour  Intake 1801 ml  Output --  Net 1801 ml   Filed Weights   11/22/19 2157 11/22/19 2251  Weight: 62.6 kg 62.6 kg    Examination:  General exam: NAD, somnolent Respiratory system: Clear to auscultation. Respiratory effort normal. Cardiovascular system: S1 & S2 heard, RRR. No  JVD, murmurs, rubs, gallops or clicks. No pedal edema. Gastrointestinal system: Abdomen is nondistended, soft and nontender. Central nervous system: somnolent, arouses and moans to painful stimuli, moving all extremities Extremities: no LEE Skin: No rashes, lesions or ulcer  Data Reviewed: I have personally reviewed following labs and imaging studies  CBC: Recent Labs  Lab 11/22/19 2220 11/23/19 0614  WBC 5.2 6.5  NEUTROABS 4.0  --   HGB 9.7* 8.9*  HCT 32.9* 29.4*  MCV 104.8* 102.8*  PLT 75* 65*   Basic Metabolic Panel: Recent Labs  Lab 11/22/19 2220 11/23/19 0041 11/23/19 0614  NA 157* 159* 157*  K 2.4* 2.6* 2.8*  CL 120* 123* 124*  CO2 _0 GLUCOSE 154* 75 87  BUN 35* 33* 31*  CREATININE 1.15* 1.12* 1.00  CALCIUM 8.6* 8.5* 8.4*  MG 2.2  --   --    GFR: Estimated Creatinine Clearance: 37.7 mL/min (by C-G formula based on SCr of 1 mg/dL). Liver Function Tests: Recent Labs  Lab 11/22/19 2220  AST 25  ALT 23  ALKPHOS 69  BILITOT 1.2  PROT 5.4*  ALBUMIN 3.1*   No results for input(s): LIPASE, AMYLASE in the last 168 hours. No results for input(s): AMMONIA in the last 168 hours. Coagulation Profile: Recent Labs  Lab 11/22/19 2220 11/23/19 0614  INR 1.1 1.1   Cardiac Enzymes: No results for input(s):  CKTOTAL, CKMB, CKMBINDEX, TROPONINI in the last 168 hours. BNP (last 3 results) No results for input(s): PROBNP in the last 8760 hours. HbA1C: No results for input(s): HGBA1C in the last 72 hours. CBG: Recent Labs  Lab 11/22/19 2150 11/22/19 2258 11/23/19 0047 11/23/19 0344  GLUCAP 62* 118* 65* 133*   Lipid Profile: No results for input(s): CHOL, HDL, LDLCALC, TRIG, CHOLHDL, LDLDIRECT in the last 72 hours. Thyroid Function Tests: Recent Labs    11/22/19 2220  TSH 6.814*  FREET4 1.03   Anemia Panel: No results for input(s): VITAMINB12, FOLATE, FERRITIN, TIBC, IRON, RETICCTPCT in the last 72 hours. Sepsis Labs: Recent Labs  Lab 11/22/19 2220 11/23/19 0614  PROCALCITON <0.10 <0.10  LATICACIDVEN 1.2  --     Recent Results (from the past 240 hour(s))  Blood culture (routine x 2)     Status: None (Preliminary result)   Collection Time: 11/22/19 10:20 PM   Specimen: BLOOD  Result Value Ref Range Status   Specimen Description BLOOD Blood Culture adequate volume  Final   Special Requests   Final    BOTTLES DRAWN AEROBIC AND ANAEROBIC LEFT ANTECUBITAL   Culture   Final    NO GROWTH < 12 HOURS Performed at [facility], [address] [place], [place] [address]    Report Status PENDING  Incomplete  Blood culture (routine x 2)     Status: None (Preliminary result)   Collection Time: 11/22/19 10:20 PM   Specimen: BLOOD  Result Value Ref Range Status   Specimen Description BLOOD LEFT WRIST  Final   Special Requests   Final    BOTTLES DRAWN AEROBIC AND ANAEROBIC Blood Culture adequate volume   Culture   Final    NO GROWTH < 12 HOURS Performed at [facility], [address] [place], [place] [address]    Report Status PENDING  Incomplete  Respiratory Panel by RT PCR (Flu Isra Lindy&B, Covid) - Nasopharyngeal Swab     Status: None   Collection Time: 11/23/19 12:41 AM   Specimen: Nasopharyngeal Swab  Result Value Ref Range Status **Note De-Identified vi Obfusction** SRS Coronvirus 2 by RT  PCR NEGTIVE NEGTIVE Finl    Comment: (NOTE) SRS-CoV-2 trget nucleic cids re NOT DETECTED. The SRS-CoV-2 RN is generlly detectble in upper respirtoy specimens during the cute phse of infection. The lowest concentrtion of SRS-CoV-2 virl copies this ssy cn detect is 131 copies/mL.  negtive result does not preclude SRS-Cov-2 infection nd should not be used s the sole bsis for tretment or other ptient mngement decisions.  negtive result my occur with  improper specimen collection/hndling, submission of specimen other thn nsophryngel swb, presence of virl muttion(s) within the res trgeted by this ssy, nd indequte number of virl copies (<131 copies/mL).  negtive result must be combined with clinicl observtions, ptient history, nd epidemiologicl informtion. The expected result is Negtive. Fct Sheet for Ptients:  PinkCheek.be Fct Sheet for [place] Providers:  GrvelBgs.it This test is not yet p proved or clered by the [place] [facility] nd  hs been uthorized for detection nd/or dignosis of SRS-CoV-2 by FD under n Emergency Use uthoriztion (EU). This EU will remin  in effect (mening this test cn be used) for the durtion of the COVID-19 declrtion under Section 564(b)(1) [place] the [place], 21 U.S.C. section 360bbb-3(b)(1), unless the uthoriztion is terminted or revoked sooner.    Influenz  by PCR NEGTIVE NEGTIVE Finl   Influenz B by PCR NEGTIVE NEGTIVE Finl    Comment: (NOTE) The Xpert Xpress SRS-CoV-2/FLU/RSV ssy is intended s n id in  the dignosis of influenz from Nsophryngel swb specimens nd  should not be used s  sole bsis for tretment. Nsl wshings nd  spirtes re uncceptble for Xpert Xpress SRS-CoV-2/FLU/RSV  testing. Fct Sheet for Ptients: PinkCheek.be Fct Sheet for [place]  Providers: GrvelBgs.it This test is not yet pproved or clered by the [place] FD nd  hs been uthorized for detection nd/or dignosis of SRS-CoV-2 by  FD under n Emergency Use uthoriztion (EU). This EU will remin  in effect (mening this test cn be used) for the durtion of the  Covid-19 declrtion under Section 564(b)(1) of the ct, 21  U.S.C. section 360bbb-3(b)(1), unless the uthoriztion is  terminted or revoked. Performed t [facility], [address] [place], [place] [address]          Rdiology Studies: CT HED WO CONTRST  Result Dte: 11/23/2019 CLINICL DT:  55 yer old femle with ltered mentl sttus. EXM: CT HED WITHOUT CONTRST TECHNIQUE: Contiguous xil imges were obtined from the bse of the skull through the vertex without intrvenous contrst. COMPRISON:  Hed CT dted 10/13/2019. FINDINGS: Evlution of this exm is limited due to motion rtifct. Brin: There is mild ge-relted trophy nd chronic microvsculr ischemic chnges. Old left inferior frontl infrct nd encephlomlci. There is no cute intrcrnil hemorrhge. No mss effect or midline shift. No extr-xil fluid collection. Vsculr: No hyperdense vessel or unexpected clcifiction. Skull: Norml. Negtive for frcture or focl lesion. Sinuses/Orbits: No cute finding. Other: None IMPRESSION: 1. No cute intrcrnil pthology. 2. ge-relted trophy nd chronic microvsculr ischemic chnges. Old left inferior frontl infrct nd encephlomlci. Electroniclly Signed   By: nner Crete M.D.   On: 11/23/2019 00:49   DG Chest Port 1 View  Result Dte: 11/22/2019 CLINICL DT:  18 yer old femle with hypothermi. EXM: PORTBLE CHEST 1 VIEW COMPRISON:  Chest rdiogrph dted 10/13/2019. FINDINGS: The ptient is rotted. No focl consolidtion, pleurl effusion or pneumothorx. Stble crdiomegly. Degenertive chnges of  the spine. No cute osseous pthology. IMPRESSION: No cute crdiopulmonry process. Stble crdiomegly. Electroniclly  Signed   By: Anner Crete M.D.   On: 11/22/2019 22:46        Scheduled Meds: . vitamin C  500 mg Oral Daily  . [START ON 11/24/2019] atorvastatin  40 mg Oral Q M,W,F  . Chlorhexidine Gluconate Cloth  6 each Topical Daily  . divalproex  250 mg Oral BID  . famotidine  40 mg Oral QHS  . gabapentin  300 mg Oral QHS  . levothyroxine  25 mcg Oral QAC breakfast  . metoprolol succinate  25 mg Oral BID  . traZODone  50 mg Oral QHS   Continuous Infusions: . ceFEPime (MAXIPIME) IV 200 mL/hr at 11/23/19 0804  . dextrose 5 % with KCl 20 mEq / L 20 mEq (11/23/19 1050)  . metronidazole 500 mg (11/23/19 0809)  . potassium chloride 10 mEq (11/23/19 1048)  . vancomycin       LOS: 0 days    Time spent: over 30 min    Fayrene Helper, MD [facility]   To contact the attending provider between 7A-7P or the covering provider during after hours 7P-7A, please log into the web site [email or web address] and access using universal [place] password for that web site. If you do not have the password, please call the hospital operator.  11/23/2019, 12:44 PM

## 2019-11-23 NOTE — Progress Notes (Signed)
Upon arrival to unit this pt had what could possibly be partially undissoved pill in her mouth she is confused and can not complete a swallow screen at this time. Notified Sharion Settler, NP. NPO order place with order to attempt swallow screen later in a.m. (see orders).  Notified pharmacy of pt NPO status and reviewed oral medication. Pt is receiving IV K+ replacement. Discussed with Pharmacy two orders IV maintinence fluids ordered. D/t hypernatremia I maintinence IV fluid d/c'd

## 2019-11-23 NOTE — Progress Notes (Signed)
Bair hugger had been place this am at 0800 with low temp reading of 95.2. Pt tolerated well. Axillary temp at this time is 98.2. Bair hugger will be discontinued. Monitor in rectum hasn't been accurate related to patient movingly too frequently. Will continue to monitor at this time

## 2019-11-23 NOTE — Progress Notes (Signed)
Pharmacy Antibiotic Note  Brianna Woods is a 85 y.o. female admitted on 11/22/2019 with AMS w/ Tmin of 91.9, hypotensive w/ systolics of 70 - [id number], plts of 75 (baseline 200 - 300) 1/4 SIRS, but 2/3 qSOFA.  Pharmacy has been consulted for vanc/cefepime dosing. Patient received vanc load of 1.5g IV and meropenem 1g IV x 1, but then switched to cefepime 2g IV.  Plan: 1) Start vancomycin 750 mg IV Q 24 hrs Goal AUC 400-550 Expected AUC: 503 T1/2: 21.1h SCr used: 1.00 Css (calculated): 30.3/14.2 mcg/mL  2) continue cefepime 2g IV q12h   Height: 5\' 2"  (157.5 cm) Weight: 138 lb 0.1 oz (62.6 kg) IBW/kg (Calculated) : 50.1  Temp (24hrs), Avg:92.8 F (33.8 C), Min:91.9 F (33.3 C), Max:93.9 F (34.4 C)  Recent Labs  Lab 11/22/19 2220 11/23/19 0041 11/23/19 0614  WBC 5.2  --  6.5  CREATININE 1.15* 1.12* 1.00  LATICACIDVEN 1.2  --   --     Estimated Creatinine Clearance: 37.7 mL/min (by C-G formula based on SCr of 1 mg/dL).     Thank you for allowing pharmacy to be a part of this patient's care.  Vallery Sa, PharmD, BCPS Clinical Pharmacist 11/23/2019 8:32 AM

## 2019-11-23 NOTE — ED Notes (Signed)
CRITICAL LAB: POTASSIUM is 2.6, [facility], Dr. Karma Greaser notified, orders received

## 2019-11-23 NOTE — H&P (Signed)
[facility] NAME: Brianna Woods    MR#:  [id number]  DATE OF BIRTH:  1934-10-31  DATE OF ADMISSION:  11/22/2019  PRIMARY CARE PHYSICIAN: Ria Bush, MD   REQUESTING/REFERRING PHYSICIAN: Hinda Kehr, MD  CHIEF COMPLAINT:  Altered mental status and abnormal labs  HISTORY OF PRESENT ILLNESS:  Brianna Woods  is a 85 y.o. African-American female with a known history of multiple medical problems that are mentioned below, including asthma, coronary artery disease, fibromyalgia, hypertension, dyslipidemia, who presented to the emergency room with acute onset of altered mental status and abnormal labs at [facility].  She is ambulatory at baseline over the last few days she not been able to ambulate.  She is been having significantly diminished appetite and decreased p.o. intake.  She had labs drawn revealed hypernatremia that was up to 172 and hypokalemia as well as chloremia.  She was also hypoglycemic with a blood glucose of 44 that was up to 62 here on arrival.  No reported nausea or vomiting or diarrhea or abdominal pain.  No reported chest pain dyspnea or cough or wheezing.  The patient was nonverbal during my interview and fairly somnolent and therefore no history could be obtained from her.  Upon presentation to the emergency room, she was hypothermic with a temperature of 91.9 and blood pressure was 120/52 with a pulse of 58 and respiratory to 16 and pulse ox 99% on room air.  Blood pressure later on was down to 73/50 and with hydration and went up to 107/51 and later 149/66.  She was placed on a warming blanket and temperature was up to 93.9 so far.  Labs revealed hypokalemia of 2.4 with a sodium of 159 and chloride 120, BUN of 5 and creatinine 1.15.  Influenza antigens and COVID-19 PCR were negative.  CBC showed thrombocytopenia of 75 and anemia with hemoglobin of 9.7 hematocrit of 32.9 comparable to previous levels.  Lactic  acid was 1.2 and procalcitonin less than 0.1.  Valproic acid level 64.  TSH was 6.8 and free T4 was normal at 1.03.  Portable chest ray showed stable cardiomegaly with no acute cardiopulmonary disease.  Noncontrasted head CT scan showed age-related atrophy and chronic microvascular ischemic changes, old left inferior frontal infarct and encephalomalacia with no acute intracranial pathology.  The patient was given 1 L bolus of IV lactated Ringer, an amp of D50 and later D5 normal saline 100 mill per hour.  She was also given IV vancomycin and meropenem.  She will be admitted to a medically monitored bed for further evaluation and management. PAST MEDICAL HISTORY:   Past Medical History:  Diagnosis Date  . Anemia   . Arthritis   . Asthma    per pt  . Atherosclerosis of abdominal aorta ([place]) 07/2015   by CT  . Barrett's esophagus    on EGD 2008, EGD WNL 2013  . CAD (coronary artery disease) 07/2015   of LAD by CT  . Diverticulosis 2014   sigmoid on colonoscopy  . Fatty pancreas 07/2015   by CT  . Fibromyalgia    per pt, no records of this   . GERD (gastroesophageal reflux disease)    and esoph stricture s/p dilation 2008 HH by CT 2016  . Glaucoma   . History of anxiety    was on prozac then effexor (pt denies h/o anxiety/depression)  . History of cardiac murmur   . History of chicken pox   .  History of CVA (cerebrovascular accident) 2008   "I've had several TIAs"  . History of right bundle branch block 2011  . History of ulcer disease    per pt, no records of this  . HLD (hyperlipidemia)   . HTN (hypertension)   . Kidney cyst, acquired 07/2015   by CT, rec rpt renal MRI in 6 months  . Mixed incontinence    on ditropan  . Nephrolithiasis    s/p surgery R kidney (1mm) 11/2009  . Nodular goiter, toxic or with hyperthyroidism    h/o toxic, on methimazole, known large left thyroid nodule 08/2013 s/p beign biopsy per patient 2010, saw Dr. Gabriel Carina, Rad I ablation 04/2014  . Vitamin D  deficiency     PAST SURGICAL HISTORY:   Past Surgical History:  Procedure Laterality Date  . CATARACT EXTRACTION  1990   w/ implants  . CHOLECYSTECTOMY  2006  . COLONOSCOPY  10/02/12   diverticulosis, o/w WNL ([place])  . dexa     no records received  . ESOPHAGOGASTRODUODENOSCOPY  11/2011   LA grade A esophagitis lower 1/3, dilated, med HH, o/w WNL - path: + GERD, no barrett's - f/u 11/2016  . LITHOTRIPSY Right 2011  . thyroid uptake scan  04/2014   unifrm uptake, enlarged thyroid consistent with grave's dz    SOCIAL HISTORY:   Social History   Tobacco Use  . Smoking status: Never Smoker  . Smokeless tobacco: Never Used  Substance Use Topics  . Alcohol use: No    Alcohol/week: 0.0 standard drinks    FAMILY HISTORY:   Family History  Problem Relation Age of Onset  . Hyperlipidemia Mother   . Hypertension Mother   . Stroke Father        hemorrhage  . Aneurysm Father 3       deceased  . Other Brother        TB  . Coronary artery disease Brother   . Aneurysm Son 40       sudden death  . Diabetes Maternal Aunt   . Cancer Brother 29       lung    DRUG ALLERGIES:   Allergies  Allergen Reactions  . Ivp Dye [Iodinated Diagnostic Agents]     Turns red; BP and HR go up  . Penicillins     "Sends me in left field"  . Statins Other (See Comments)    Body aches (lipitor, simvastatin, pravastatin)    REVIEW OF SYSTEMS:   ROS As per history of present illness. All pertinent systems were reviewed above. Constitutional,  HEENT, cardiovascular, respiratory, GI, GU, musculoskeletal, neuro, psychiatric, endocrine,  integumentary and hematologic systems were reviewed and are otherwise  negative/unremarkable except for positive findings mentioned above in the HPI.   MEDICATIONS AT HOME:   Prior to Admission medications   Medication Sig Start Date End Date Taking? Authorizing Provider  atorvastatin (LIPITOR) 40 MG tablet TAKE 1 TABLET ON MONDAY, [place] Patient taking differently: Take 40 mg by mouth every Monday, Wednesday, and Friday.  07/29/19  Yes Ria Bush, MD  divalproex (DEPAKOTE SPRINKLE) 125 MG capsule Take 250 mg by mouth 2 (two) times daily.   Yes [provider]  famotidine (PEPCID) 40 MG tablet TAKE 1 TABLET (40 MG TOTAL) BY MOUTH AT BEDTIME (REPLACES ZANTAC) Patient taking differently: Take 40 mg by mouth at bedtime.  09/24/19  Yes Ria Bush, MD  gabapentin (NEURONTIN) 300 MG capsule Take 1 capsule (300 mg total) by  mouth at bedtime. 10/13/19  Yes Ria Bush, MD  levothyroxine (SYNTHROID) 25 MCG tablet Take 25 mcg by mouth daily before breakfast.   Yes [provider]  LORazepam (ATIVAN) 1 MG tablet Take 1 mg by mouth at bedtime as needed for anxiety.   Yes [provider]  losartan (COZAAR) 100 MG tablet Take 1 tablet (100 mg total) by mouth daily. 10/13/19  Yes Ria Bush, MD  metoprolol succinate (TOPROL-XL) 25 MG 24 hr tablet Take 25 mg by mouth 2 (two) times daily.   Yes [provider]  pantoprazole (PROTONIX) 40 MG tablet Take 80 mg by mouth daily.   Yes [provider]  potassium chloride 20 MEQ/15ML (10%) SOLN Take 20 mEq by mouth 2 (two) times daily.   Yes [provider]  traZODone (DESYREL) 100 MG tablet TAKE 1/2 TO 1 TABLET AT BEDTIME Patient taking differently: Take 50-100 mg by mouth at bedtime.  09/26/19  Yes Ria Bush, MD  vitamin C (ASCORBIC ACID) 500 MG tablet Take 500 mg by mouth daily.   Yes [provider]  esomeprazole (NEXIUM) 40 MG capsule TAKE 1 CAPSULE EVERY DAY AT 12 NOON Patient not taking: No sig reported 07/29/19   Ria Bush, MD  levothyroxine (SYNTHROID) 50 MCG tablet Take 50 mcg by mouth daily before breakfast.    [provider]  pirbuterol (MAXAIR) 200 MCG/INH inhaler Inhale 2 puffs into the lungs 4 (four) times daily. 09/28/11 01/22/12  Ria Bush, MD      VITAL SIGNS:   Blood pressure (!) 107/53, pulse 70, temperature (!) 91.9 F (33.3 C), temperature source Rectal, resp. rate 15, height 5\' 2"  (1.575 m), weight 62.6 kg, SpO2 97 %.  PHYSICAL EXAMINATION:  Physical Exam  GENERAL:  85 y.o.-year-old African-American female patient lying in the bed with no acute distress.  She is very somnolent and difficult to arouse. EYES: Pupils equal, round, reactive to light and accommodation. No scleral icterus. Extraocular muscles intact.  HEENT: Head atraumatic, normocephalic. Oropharynx and nasopharynx clear.  NECK:  Supple, no jugular venous distention. No thyroid enlargement, no tenderness.  LUNGS: Normal breath sounds bilaterally, no wheezing, rales,rhonchi or crepitation. No use of accessory muscles of respiration.  CARDIOVASCULAR: Regular rate and rhythm, S1, S2 normal. No murmurs, rubs, or gallops.  ABDOMEN: Soft, nondistended, nontender. Bowel sounds present. No organomegaly or mass.  EXTREMITIES: No pedal edema, cyanosis, or clubbing.  NEUROLOGIC: Exam is grossly nonfocal.  She is not following commands.  She is moving all 4 extremities.  Gait not checked.  PSYCHIATRIC: The patient is somnolent and difficult to arouse.   SKIN: No obvious rash, lesion, or ulcer.   LABORATORY PANEL:   CBC Recent Labs  Lab 11/22/19 2220  WBC 5.2  HGB 9.7*  HCT 32.9*  PLT 75*   ------------------------------------------------------------------------------------------------------------------  Chemistries  Recent Labs  Lab 11/22/19 2220 11/22/19 2220 11/23/19 0041  NA 157*   < > 159*  K 2.4*   < > 2.6*  CL 120*   < > 123*  CO2 27   < > 27  GLUCOSE 154*   < > 75  BUN 35*   < > 33*  CREATININE 1.15*   < > 1.12*  CALCIUM 8.6*   < > 8.5*  MG 2.2  --   --   AST 25  --   --   ALT 23  --   --   ALKPHOS 69  --   --   BILITOT 1.2  --   --    < > =  values in this interval not displayed.    ------------------------------------------------------------------------------------------------------------------  Cardiac Enzymes No results for input(s): TROPONINI in the last 168 hours. ------------------------------------------------------------------------------------------------------------------  RADIOLOGY:  DG Chest Port 1 View  Result Date: 11/22/2019 CLINICAL DATA:  85 year old female with hypothermia. EXAM: PORTABLE CHEST 1 VIEW COMPARISON:  Chest radiograph dated 10/13/2019. FINDINGS: The patient is rotated. No focal consolidation, pleural effusion or pneumothorax. Stable cardiomegaly. Degenerative changes of the spine. No acute osseous pathology. IMPRESSION: No acute cardiopulmonary process. Stable cardiomegaly. Electronically Signed   By: Anner Crete M.D.   On: 11/22/2019 22:46      IMPRESSION AND PLAN:   1.  SIRS manifested with hypothermia, hypotension, with acute encephalopathy, rule out sepsis. -The patient will be admitted to the medical monitored bed. -We will continue IV antibiotic therapy with broad-spectrum coverage with IV vancomycin, cefepime and Flagyl. -The patient will be hydrated with D5 1/2 normal saline 100 mill per hour. -Warming blanket/Bair hugger will be continued.  -We will follow blood cultures.  2.  Hypoglycemia. -This could be certainly contributing to her acute encephalopathy. -She will be placed on D5 1/2 normal saline and will monitor blood glucose levels.  3.  Abnormal electrolytes including hypokalemia, hyernatremia and hyperchloremia. -This is likely hypovolemic. -We will replace her electrolytes and hydrate her as mentioned above.  4.  Hypothyroidism. -We will continue Synthroid.  5.  Hypertension. -We will hold off Cozaar and resume Toprol-XL with adequate BP.  6.  GERD. -We will continue PPI therapy as well as H2 blocker.  7.  Peripheral neuropathy. -We will continue Neurontin.  8.  Dyslipidemia. -We will continue  statin therapy.  9.  DVT prophylaxis. -Subcutaneous Lovenox   All the records are reviewed and case discussed with ED provider. The plan of care was discussed in details with the patient (and family). I answered all questions. The patient agreed to proceed with the above mentioned plan. Further management will depend upon hospital course.   CODE STATUS: Full code  TOTAL TIME TAKING CARE OF THIS PATIENT: 60 minutes.    Christel Mormon M.D on 11/23/2019 at 3:17 AM  [facility]   From 7 PM-7 AM, contact night-coverage [email or web address]  CC: Primary care physician; Ria Bush, MD   Note: This dictation was prepared with Dragon dictation along with smaller phrase technology. Any transcriptional errors that result from this process are unintentional.

## 2019-11-23 NOTE — Progress Notes (Signed)
Pharmacy Antibiotic Note  Brianna Woods is a 85 y.o. female admitted on 11/22/2019 with AMS w/ Tmin of 91.9, hypotensive w/ systolics of 70 - [id number], plts of 75 (baseline 200 - 300) 1/4 SIRS, but 2/3 qSOFA.  Pharmacy has been consulted for vanc/cefepime dosing. Patient received vanc load of 1.5g IV and meropenem 1g IV x 1, but then switched to cefepime 2g IV.  Plan: Vancomycin 1000 mg IV Q 36 hrs. Goal AUC 400-550. Expected AUC: 495.8 SCr used: 1.12 Cssmin: 11.9  Will start cefepime 2g IV q12h per CrCl 30 - 60 ml/min and will continue to monitor renal function and adjust as necessary.  Height: 5\' 2"  (157.5 cm) Weight: 138 lb 0.1 oz (62.6 kg) IBW/kg (Calculated) : 50.1  Temp (24hrs), Avg:92.9 F (33.8 C), Min:91.9 F (33.3 C), Max:93.9 F (34.4 C)  Recent Labs  Lab 11/22/19 2220 11/23/19 0041  WBC 5.2  --   CREATININE 1.15* 1.12*  LATICACIDVEN 1.2  --     Estimated Creatinine Clearance: 33.7 mL/min (A) (by C-G formula based on SCr of 1.12 mg/dL (H)).    Allergies  Allergen Reactions  . Ivp Dye [Iodinated Diagnostic Agents]     Turns red; BP and HR go up  . Penicillins     "Sends me in left field"  . Statins Other (See Comments)    Body aches (lipitor, simvastatin, pravastatin)    Thank you for allowing pharmacy to be a part of this patient's care.  Tobie Lords, PharmD, BCPS Clinical Pharmacist 11/23/2019 5:15 AM

## 2019-11-24 DIAGNOSIS — E870 Hyperosmolality and hypernatremia: Secondary | ICD-10-CM

## 2019-11-24 LAB — BASIC METABOLIC PANEL
Anion gap: 3 — ABNORMAL LOW (ref 5–15)
BUN: 15 mg/dL (ref 8–23)
CO2: 26 mmol/L (ref 22–32)
Calcium: 8.6 mg/dL — ABNORMAL LOW (ref 8.9–10.3)
Chloride: 124 mmol/L — ABNORMAL HIGH (ref 98–111)
Creatinine, Ser: 0.82 mg/dL (ref 0.44–1.00)
GFR calc Af Amer: >60 mL/min (ref >60)
GFR calc non Af Amer: >60 mL/min (ref >60)
Glucose, Bld: 110 mg/dL — ABNORMAL HIGH (ref 70–99)
Potassium: 3.5 mmol/L (ref 3.5–5.1)
Sodium: 153 mmol/L — ABNORMAL HIGH (ref 135–145)

## 2019-11-24 LAB — COMPREHENSIVE METABOLIC PANEL
ALT: 24 U/L (ref 0–44)
AST: 28 U/L (ref 15–41)
Albumin: 2.8 g/dL — ABNORMAL LOW (ref 3.5–5.0)
Alkaline Phosphatase: 60 U/L (ref 38–126)
Anion gap: 4 — ABNORMAL LOW (ref 5–15)
BUN: 19 mg/dL (ref 8–23)
CO2: 26 mmol/L (ref 22–32)
Calcium: 8.8 mg/dL — ABNORMAL LOW (ref 8.9–10.3)
Chloride: 122 mmol/L — ABNORMAL HIGH (ref 98–111)
Creatinine, Ser: 0.85 mg/dL (ref 0.44–1.00)
GFR calc Af Amer: >60 mL/min (ref >60)
GFR calc non Af Amer: >60 mL/min (ref >60)
Glucose, Bld: 97 mg/dL (ref 70–99)
Potassium: 3.4 mmol/L — ABNORMAL LOW (ref 3.5–5.1)
Sodium: 152 mmol/L — ABNORMAL HIGH (ref 135–145)
Total Bilirubin: 0.8 mg/dL (ref 0.3–1.2)
Total Protein: 5.3 g/dL — ABNORMAL LOW (ref 6.5–8.1)

## 2019-11-24 LAB — CBC WITH DIFFERENTIAL/PLATELET
Abs Immature Granulocytes: 0.1 10*3/uL — ABNORMAL HIGH (ref 0.00–0.07)
Basophils Absolute: 0 10*3/uL (ref 0.0–0.1)
Basophils Relative: 0 %
Eosinophils Absolute: 0.1 10*3/uL (ref 0.0–0.5)
Eosinophils Relative: 1 %
HCT: 31.3 % — ABNORMAL LOW (ref 36.0–46.0)
Hemoglobin: 9.4 g/dL — ABNORMAL LOW (ref 12.0–15.0)
Immature Granulocytes: 1 %
Lymphocytes Relative: 21 %
Lymphs Abs: 1.6 10*3/uL (ref 0.7–4.0)
MCH: 30.6 pg (ref 26.0–34.0)
MCHC: 30 g/dL (ref 30.0–36.0)
MCV: 102 fL — ABNORMAL HIGH (ref 80.0–100.0)
Monocytes Absolute: 0.9 10*3/uL (ref 0.1–1.0)
Monocytes Relative: 12 %
Neutro Abs: 5 10*3/uL (ref 1.7–7.7)
Neutrophils Relative %: 65 %
Platelets: 59 10*3/uL — ABNORMAL LOW (ref 150–400)
RBC: 3.07 MIL/uL — ABNORMAL LOW (ref 3.87–5.11)
RDW: 17.2 % — ABNORMAL HIGH (ref 11.5–15.5)
WBC: 7.8 10*3/uL (ref 4.0–10.5)
nRBC: 0.9 % — ABNORMAL HIGH (ref 0.0–0.2)

## 2019-11-24 LAB — GLUCOSE, CAPILLARY
Glucose-Capillary: 105 mg/dL — ABNORMAL HIGH (ref 70–99)
Glucose-Capillary: 111 mg/dL — ABNORMAL HIGH (ref 70–99)
Glucose-Capillary: 128 mg/dL — ABNORMAL HIGH (ref 70–99)
Glucose-Capillary: 81 mg/dL (ref 70–99)
Glucose-Capillary: 89 mg/dL (ref 70–99)
Glucose-Capillary: 95 mg/dL (ref 70–99)

## 2019-11-24 LAB — URINE CULTURE: Culture: NO GROWTH

## 2019-11-24 LAB — PHOSPHORUS: Phosphorus: 1.7 mg/dL — ABNORMAL LOW (ref 2.5–4.6)

## 2019-11-24 LAB — MAGNESIUM: Magnesium: 1.7 mg/dL (ref 1.7–2.4)

## 2019-11-24 MED ORDER — LORAZEPAM 2 MG/ML IJ SOLN
0.5000 mg | INTRAMUSCULAR | Status: DC | PRN
  Administered 2019-11-26: 04:00:00 0.5 mg via INTRAVENOUS
  Filled 2019-11-24 (×2): qty 1

## 2019-11-24 MED ORDER — POTASSIUM PHOSPHATES 15 MMOLE/5ML IV SOLN
15.0000 mmol | Freq: Once | INTRAVENOUS | Status: AC
  Administered 2019-11-24: 14:00:00 15 mmol via INTRAVENOUS
  Filled 2019-11-24: qty 5

## 2019-11-24 MED ORDER — MAGNESIUM SULFATE 2 GM/50ML IV SOLN
2.0000 g | Freq: Once | INTRAVENOUS | Status: AC
  Administered 2019-11-24: 12:00:00 2 g via INTRAVENOUS
  Filled 2019-11-24: qty 50

## 2019-11-24 MED ORDER — LORAZEPAM 2 MG/ML IJ SOLN
INTRAMUSCULAR | Status: AC
  Administered 2019-11-24: 12:00:00 0.5 mg via INTRAVENOUS
  Filled 2019-11-24: qty 1

## 2019-11-24 MED ORDER — VANCOMYCIN HCL 750 MG/150ML IV SOLN
750.0000 mg | INTRAVENOUS | Status: DC
  Administered 2019-11-24: 07:00:00 750 mg via INTRAVENOUS
  Filled 2019-11-24: qty 150

## 2019-11-24 NOTE — Progress Notes (Signed)
Patient's rectal temp 95.1.  Placed patient on a warming blanket with rectal probe in place for continuous temperature readings.  Dr. Florene Glen made aware.  Clarise Cruz, BSN

## 2019-11-24 NOTE — Progress Notes (Signed)
SLP Cancellation Note  Patient Details Name: Brianna Woods MRN: [id number] DOB: 12/03/1934   Cancelled treatment:       Reason Eval/Treat Not Completed: Patient not medically ready;Medical issues which prohibited therapy;Fatigue/lethargy limiting ability to participate(chart5 reviewed; consulted NSG then met w/ pt) Upon attempting to alert/arouse pt for BSE using Mod+ verbal/tactile stim, pt only mumbled x2 not opening eyes. Pt remained curled on her side next to bedrail and did not make any attempts to adjust herself in bed. Unsure of pt's Cognitive awareness at this time - she does not appear to be able to sufficiently alert for safe oral intake/BSE.  ST services will f/u w/ pt's status tomorrow for BSE. Recommend continued NPO status w/ frequent oral care for hygiene and stimulation of swallowing; aspiration precautions.     Orinda Kenner, [place], CCC-SLP Watson,Katherine 11/24/2019, 1:02 PM

## 2019-11-24 NOTE — Progress Notes (Signed)
PROGRESS NOTE    Brianna Woods  YYT:[id number] DOB: 1934/11/04 DOA: 11/22/2019 PCP: Ria Bush, MD   Brief Narrative:  Brianna Woods  is Brianna Woods 85 y.o. African-American female with Brianna Woods known history of multiple medical problems that are mentioned below, including asthma, coronary artery disease, fibromyalgia, hypertension, dyslipidemia, who presented to the emergency room with acute onset of altered mental status and abnormal labs at [facility].  She is ambulatory at baseline over the last few days she not been able to ambulate.  She is been having significantly diminished appetite and decreased p.o. intake.  She had labs drawn revealed hypernatremia that was up to 172 and hypokalemia as well as chloremia.  She was also hypoglycemic with Ledell Codrington blood glucose of 44 that was up to 62 here on arrival.  No reported nausea or vomiting or diarrhea or abdominal pain.  No reported chest pain dyspnea or cough or wheezing.  The patient was nonverbal during my interview and fairly somnolent and therefore no history could be obtained from her.  Upon presentation to the emergency room, she was hypothermic with Thera Basden temperature of 91.9 and blood pressure was 120/52 with Nyanna Heideman pulse of 58 and respiratory to 16 and pulse ox 99% on room air.  Blood pressure later on was down to 73/50 and with hydration and went up to 107/51 and later 149/66.  She was placed on Malon Branton warming blanket and temperature was up to 93.9 so far.  Labs revealed hypokalemia of 2.4 with Kandie Keiper sodium of 159 and chloride 120, BUN of 5 and creatinine 1.15.  Influenza antigens and COVID-19 PCR were negative.  CBC showed thrombocytopenia of 75 and anemia with hemoglobin of 9.7 hematocrit of 32.9 comparable to previous levels.  Lactic acid was 1.2 and procalcitonin less than 0.1.  Valproic acid level 64.  TSH was 6.8 and free T4 was normal at 1.03.  Portable chest ray showed stable cardiomegaly with no acute cardiopulmonary disease.  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  The patient was given 1 L bolus of IV lactated Ringer, an amp of D50 and later D5 normal saline 100 mill per hour.  She was also given IV vancomycin and meropenem.  She will be admitted to [facility] medically monitored bed for further evaluation and management.  Assessment & Plan:   Active Problems:   Altered mental status   Encephalopathy  Goals of Care:  Patient seems to have been declining over the past several weeks.  Moved in with son b/c not doing well independently, found down Stephonie Wilcoxen few times.  She lived with son, but declined with him as well, falling asleep in conversations, etc.  [facility]  Updated that she wasn't eating or drinking, not acting herself, abnormal labs and transferred here. Discussed code status with son, [name] who noted that he thinks she would want to be DNR Will c/s palliative care as it seems she's failing to thrive  1.  SIRS   Hypothermia   Hypotension   AMS   R/o Sepsis  - intermittent hypothermia, persists - follow AM cortisol, TSH 6.814 with normal free T4 - Continue empiric IV abx - narrow to cefepime - UA does not appear c/w UTI, CXR without acute abnormality - follow blood and urine cultures NG - pending MRSA pcr  Acute metabolic encephalopathy - her encephalopathy is persistent, but slightly improvedd -  TSH as above, vitamin b12, folate, VBG - delirium precautions - prolonged qt on EKG -> repeat - ativan for agitation for now  # Hypothermia:  - follow cortisol, TSH 6.814 with normal free T4 - she's hypoglycemia, which is likely contributing - improved after bair hugger  2.  Hypoglycemia. - Continue to monitor on D5 fluids - Q4 POC BG checks  # Hypernatremia   Hyperchloremia: 2/2 dehydration, follow on hypotonic fluids (with hyperchloremia, ? Related to NS administration as  well?) - improving  # Hypophosphatemia: replace and follow  # Hypokalemia:  Replace and follow   4.  Hypothyroidism. -We will continue Synthroid.  5.  Hypertension. -We will hold off Cozaar and resume Toprol-XL with adequate BP.  6.  GERD. -We will continue PPI therapy as well as H2 blocker.  7.  Peripheral neuropathy. -We will continue Neurontin.  8.  Dyslipidemia. -We will continue statin therapy.  # Anemia   Thrombocytopenia: thrombocytopenia is new, could be related to sepsis with hypothermia, continue to monitor.    DVT prophylaxis: SCD Code Status: DNR Family Communication: son no answer 3/15 Disposition Plan:   Patient came from: SNF             Anticipated d/c place:SNF  Barriers to d/c OR conditions which need to be met to effect [place] safe d/c: pending improvement in electrolyte abnormalities and AMS, palliative care c/s   Consultants:   Palliative care  Procedures:   none  Antimicrobials:  Anti-infectives (From admission, onward)   Start     Dose/Rate Route Frequency Ordered Stop   11/24/19 0630  vancomycin (VANCOREADY) IVPB 750 mg/150 mL  Status:  Discontinued     750 mg 150 mL/hr over 60 Minutes Intravenous Every 24 hours 11/24/19 0644 11/24/19 1118   11/23/19 2200  vancomycin (VANCOREADY) IVPB 750 mg/150 mL  Status:  Discontinued     750 mg 150 mL/hr over 60 Minutes Intravenous Every 24 hours 11/23/19 0838 11/24/19 0644   11/23/19 0600  metroNIDAZOLE (FLAGYL) IVPB 500 mg  Status:  Discontinued     500 mg 100 mL/hr over 60 Minutes Intravenous Every 8 hours 11/23/19 0439 11/23/19 1258   11/23/19 0515  ceFEPIme (MAXIPIME) 2 g in sodium chloride 0.9 % 100 mL IVPB     2 g 200 mL/hr over 30 Minutes Intravenous Every 12 hours 11/23/19 0439     11/23/19 0445  vancomycin (VANCOCIN) IVPB 1000 mg/200 mL premix  Status:  Discontinued     1,000 mg 200 mL/hr over 60 Minutes Intravenous  Once 11/23/19 0439 11/23/19 0507   11/22/19 2330  vancomycin  (VANCOCIN) IVPB 1000 mg/200 mL premix     1,000 mg 200 mL/hr over 60 Minutes Intravenous  Once 11/22/19 2324 11/23/19 0315   11/22/19 2330  vancomycin (VANCOREADY) IVPB 500 mg/100 mL     500 mg 100 mL/hr over 60 Minutes Intravenous  Once 11/22/19 2328 11/23/19 0513   11/22/19 2230  meropenem (MERREM) 1 g in sodium chloride 0.9 % 100 mL IVPB     1 g 200 mL/hr over 30 Minutes Intravenous  Once 11/22/19 2219 11/22/19 2313     Subjective: Confused, moaning, will answer simple questions, inconsistently  Objective: Vitals:   11/24/19 1143 11/24/19 1211 11/24/19 1404 11/24/19 1625  BP:      Pulse:      Resp:      Temp: (!) 97 F (36.1 C) (!) 97.2 F (36.2 C) (!) 97.5 F (36.4 C) (!) 97.3 F (  36.3 C)  TempSrc: Rectal Rectal Rectal Axillary  SpO2:      Weight:      Height:        Intake/Output Summary (Last 24 hours) at 11/24/2019 1932 Last data filed at 11/24/2019 1803 Gross per 24 hour  Intake 1525.49 ml  Output --  Net 1525.49 ml   Filed Weights   11/22/19 2157 11/22/19 2251  Weight: 62.6 kg 62.6 kg    Examination:  General: lethargic Cardiovascular: Heart sounds show Anacaren Kohan regular rate, and rhythm. Lungs: Clear to auscultation bilaterally  Abdomen: Soft, nontender, nondistended  Neurological: Alert and dsioriented. Moves all extremities 4. Cranial nerves II through XII grossly intact. Skin: Warm and dry. No rashes or lesions. Extremities: No clubbing or cyanosis. No edema.   Data Reviewed: I have personally reviewed following labs and imaging studies  CBC: Recent Labs  Lab 11/22/19 2220 11/23/19 0614 11/24/19 0612  WBC 5.2 6.5 7.8  NEUTROABS 4.0  --  5.0  HGB 9.7* 8.9* 9.4*  HCT 32.9* 29.4* 31.3*  MCV 104.8* 102.8* 102.0*  PLT 75* 65* 59*   Basic Metabolic Panel: Recent Labs  Lab 11/22/19 2220 11/23/19 0041 11/23/19 0614 11/23/19 1416 11/24/19 0612  NA 157* 159* 157* 155* 152*  K 2.4* 2.6* 2.8* 3.4* 3.4*  CL 120* 123* 124* 123* 122*  CO2 _0 GLUCOSE 154* 75 87 58* 97  BUN 35* 33* 31* 26* 19  CREATININE 1.15* 1.12* 1.00 0.98 0.85  CALCIUM 8.6* 8.5* 8.4* 8.4* 8.8*  MG 2.2  --   --   --  1.7  PHOS  --   --   --   --  1.7*   GFR: Estimated Creatinine Clearance: 44.4 mL/min (by C-G formula based on SCr of 0.85 mg/dL). Liver Function Tests: Recent Labs  Lab 11/22/19 2220 11/24/19 0612  AST 25 28  ALT 23 24  ALKPHOS 69 60  BILITOT 1.2 0.8  PROT 5.4* 5.3*  ALBUMIN 3.1* 2.8*   No results for input(s): LIPASE, AMYLASE in the last 168 hours. No results for input(s): AMMONIA in the last 168 hours. Coagulation Profile: Recent Labs  Lab 11/22/19 2220 11/23/19 0614  INR 1.1 1.1   Cardiac Enzymes: No results for input(s): CKTOTAL, CKMB, CKMBINDEX, TROPONINI in the last 168 hours. BNP (last 3 results) No results for input(s): PROBNP in the last 8760 hours. HbA1C: No results for input(s): HGBA1C in the last 72 hours. CBG: Recent Labs  Lab 11/24/19 0015 11/24/19 0427 11/24/19 0813 11/24/19 1131 11/24/19 1649  GLUCAP 128* 111* 81 95 105*   Lipid Profile: No results for input(s): CHOL, HDL, LDLCALC, TRIG, CHOLHDL, LDLDIRECT in the last 72 hours. Thyroid Function Tests: Recent Labs    11/22/19 2220  TSH 6.814*  FREET4 1.03   Anemia Panel: No results for input(s): VITAMINB12, FOLATE, FERRITIN, TIBC, IRON, RETICCTPCT in the last 72 hours. Sepsis Labs: Recent Labs  Lab 11/22/19 2220 11/23/19 0614  PROCALCITON <0.10 <0.10  LATICACIDVEN 1.2  --     Recent Results (from the past 240 hour(s))  Urine culture     Status: None   Collection Time: 11/22/19 10:20 PM   Specimen: Urine, Catheterized  Result Value Ref Range Status   Specimen Description   Final    URINE, CATHETERIZED Performed at [facility], [address], [place], [place] [address]    Special Requests   Final    NONE Performed at [facility], [place] [address]Note De-Identified [address] [place], [place] [address]    Culture   Finl    NO  GROWTH Performed t [facility], [place] [address], [place], [place] [address]    Report Sttus 11/24/2019 FINL  Finl  Blood culture (routine x 2)     Sttus: None (Preliminry result)   Collection Time: 11/22/19 10:20 PM   Specimen: BLOOD  Result Vlue Ref Rnge Sttus   Specimen Description BLOOD Blood Culture dequte volume  Finl   Specil Requests   Finl    BOTTLES DRWN EROBIC ND NEROBIC LEFT NTECUBITL   Culture   Finl    NO GROWTH 2 DYS Performed t [facility], [address], [place], [place] [address]    Report Sttus PENDING  Incomplete  Blood culture (routine x 2)     Sttus: None (Preliminry result)   Collection Time: 11/22/19 10:20 PM   Specimen: BLOOD  Result Vlue Ref Rnge Sttus   Specimen Description BLOOD LEFT WRIST  Finl   Specil Requests   Finl    BOTTLES DRWN EROBIC ND NEROBIC Blood Culture dequte volume   Culture   Finl    NO GROWTH 2 DYS Performed t [facility], [address] [place], [place] [address]    Report Sttus PENDING  Incomplete  Respirtory Pnel by RT PCR (Flu &B, Covid) - Nsophryngel Swb     Sttus: None   Collection Time: 11/23/19 12:41 M   Specimen: Nsophryngel Swb  Result Vlue Ref Rnge Sttus   SRS Coronvirus 2 by RT PCR NEGTIVE NEGTIVE Finl    Comment: (NOTE) SRS-CoV-2 trget nucleic cids re NOT DETECTED. The SRS-CoV-2 RN is generlly detectble in upper respirtoy specimens during the cute phse of infection. The lowest concentrtion of SRS-CoV-2 virl copies this ssy cn detect is 131 copies/mL.  negtive result does not preclude SRS-Cov-2 infection nd should not be used s the sole bsis for tretment or other ptient mngement decisions.  negtive result my occur with  improper specimen collection/hndling, submission of specimen other thn nsophryngel swb, presence of virl muttion(s) within the res trgeted by this ssy, nd  indequte number of virl copies (<131 copies/mL).  negtive result must be combined with clinicl observtions, ptient history, nd epidemiologicl informtion. The expected result is Negtive. Fct Sheet for Ptients:  PinkCheek.be Fct Sheet for [place] Providers:  GrvelBgs.it This test is not yet p proved or clered by the [place] FD nd  hs been uthorized for detection nd/or dignosis of SRS-CoV-2 by FD under n Emergency Use uthoriztion (EU). This EU will remin  in effect (mening this test cn be used) for the durtion of the COVID-19 declrtion under Section 564(b)(1) of the ct, 21 U.S.C. section 360bbb-3(b)(1), unless the uthoriztion is terminted or revoked sooner.    Influenz  by PCR NEGTIVE NEGTIVE Finl   Influenz B by PCR NEGTIVE NEGTIVE Finl    Comment: (NOTE) The Xpert Xpress SRS-CoV-2/FLU/RSV ssy is intended s n id in  the dignosis of influenz from Nsophryngel swb specimens nd  should not be used s  sole bsis for tretment. Nsl wshings nd  spirtes re uncceptble for Xpert Xpress SRS-CoV-2/FLU/RSV  testing. Fct Sheet for Ptients: PinkCheek.be Fct Sheet for [place] Providers: GrvelBgs.it This test is not yet pproved or clered by the [place] FD nd  hs been uthorized for detection nd/or dignosis of SRS-CoV-2 by  FD under n Emergency Use uthoriztion (EU). This EU will remin  in effect (mening this test cn  be used) for the duration of the  Covid-19 declaration under Section 564(b)(1) of the Act, 21  U.S.C. section 360bbb-3(b)(1), unless the authorization is  terminated or revoked. Performed at [facility], [address], [place], [place] [address]          Radiology Studies: CT HEAD WO CONTRAST  Result Date: 11/23/2019 CLINICAL DATA:   85 year old female with altered mental status. EXAM: CT HEAD WITHOUT CONTRAST TECHNIQUE: Contiguous axial images were obtained from the base of the skull through the vertex without intravenous contrast. COMPARISON:  Head CT dated 10/13/2019. FINDINGS: Evaluation of this exam is limited due to motion artifact. Brain: There is mild age-related atrophy and chronic microvascular ischemic changes. Old left inferior frontal infarct and encephalomalacia. There is no acute intracranial hemorrhage. No mass effect or midline shift. No extra-axial fluid collection. Vascular: No hyperdense vessel or unexpected calcification. Skull: Normal. Negative for fracture or focal lesion. Sinuses/Orbits: No acute finding. Other: None IMPRESSION: 1. No acute intracranial pathology. 2. Age-related atrophy and chronic microvascular ischemic changes. Old left inferior frontal infarct and encephalomalacia. Electronically Signed   By: Anner Crete M.D.   On: 11/23/2019 00:49   DG Chest Port 1 View  Result Date: 11/22/2019 CLINICAL DATA:  85 year old female with hypothermia. EXAM: PORTABLE CHEST 1 VIEW COMPARISON:  Chest radiograph dated 10/13/2019. FINDINGS: The patient is rotated. No focal consolidation, pleural effusion or pneumothorax. Stable cardiomegaly. Degenerative changes of the spine. No acute osseous pathology. IMPRESSION: No acute cardiopulmonary process. Stable cardiomegaly. Electronically Signed   By: Anner Crete M.D.   On: 11/22/2019 22:46        Scheduled Meds:  vitamin C  500 mg Oral Daily   atorvastatin  40 mg Oral Q M,W,F   Chlorhexidine Gluconate Cloth  6 each Topical Daily   divalproex  250 mg Oral BID   famotidine  40 mg Oral QHS   gabapentin  300 mg Oral QHS   levothyroxine  12.5 mcg Intravenous q AM   metoprolol succinate  25 mg Oral BID   traZODone  50 mg Oral QHS   Continuous Infusions:  ceFEPime (MAXIPIME) IV 2 g (11/24/19 1824)   dextrose 10 % 1,000 mL with potassium  chloride 20 mEq infusion 100 mL/hr at 11/24/19 1823   potassium PHOSPHATE IVPB (in mmol) 43 mL/hr at 11/24/19 1803     LOS: 1 day    Time spent: over 30 min    Fayrene Helper, MD [facility]   To contact the attending provider between 7A-7P or the covering provider during after hours 7P-7A, please log into the web site [email or web address] and access using universal [place] password for that web site. If you do not have the password, please call the hospital operator.  11/24/2019, 7:32 PM

## 2019-11-25 DIAGNOSIS — R627 Adult failure to thrive: Secondary | ICD-10-CM

## 2019-11-25 DIAGNOSIS — E870 Hyperosmolality and hypernatremia: Secondary | ICD-10-CM

## 2019-11-25 DIAGNOSIS — E876 Hypokalemia: Secondary | ICD-10-CM

## 2019-11-25 DIAGNOSIS — Z7189 Other specified counseling: Secondary | ICD-10-CM

## 2019-11-25 DIAGNOSIS — E162 Hypoglycemia, unspecified: Secondary | ICD-10-CM

## 2019-11-25 DIAGNOSIS — Z515 Encounter for palliative care: Secondary | ICD-10-CM

## 2019-11-25 DIAGNOSIS — G9340 Encephalopathy, unspecified: Secondary | ICD-10-CM

## 2019-11-25 LAB — CBC WITH DIFFERENTIAL/PLATELET
Abs Immature Granulocytes: 0.1 10*3/uL — ABNORMAL HIGH (ref 0.00–0.07)
Basophils Absolute: 0 10*3/uL (ref 0.0–0.1)
Basophils Relative: 0 %
Eosinophils Absolute: 0.1 10*3/uL (ref 0.0–0.5)
Eosinophils Relative: 2 %
HCT: 30.7 % — ABNORMAL LOW (ref 36.0–46.0)
Hemoglobin: 9.8 g/dL — ABNORMAL LOW (ref 12.0–15.0)
Immature Granulocytes: 1 %
Lymphocytes Relative: 21 %
Lymphs Abs: 1.7 10*3/uL (ref 0.7–4.0)
MCH: 31.4 pg (ref 26.0–34.0)
MCHC: 31.9 g/dL (ref 30.0–36.0)
MCV: 98.4 fL (ref 80.0–100.0)
Monocytes Absolute: 1.2 10*3/uL — ABNORMAL HIGH (ref 0.1–1.0)
Monocytes Relative: 15 %
Neutro Abs: 4.9 10*3/uL (ref 1.7–7.7)
Neutrophils Relative %: 61 %
Platelets: 63 10*3/uL — ABNORMAL LOW (ref 150–400)
RBC: 3.12 MIL/uL — ABNORMAL LOW (ref 3.87–5.11)
RDW: 17.2 % — ABNORMAL HIGH (ref 11.5–15.5)
WBC: 8.1 10*3/uL (ref 4.0–10.5)
nRBC: 0.2 % (ref 0.0–0.2)

## 2019-11-25 LAB — COMPREHENSIVE METABOLIC PANEL
ALT: 23 U/L (ref 0–44)
AST: 25 U/L (ref 15–41)
Albumin: 2.7 g/dL — ABNORMAL LOW (ref 3.5–5.0)
Alkaline Phosphatase: 64 U/L (ref 38–126)
Anion gap: 9 (ref 5–15)
BUN: 13 mg/dL (ref 8–23)
CO2: 24 mmol/L (ref 22–32)
Calcium: 8.8 mg/dL — ABNORMAL LOW (ref 8.9–10.3)
Chloride: 121 mmol/L — ABNORMAL HIGH (ref 98–111)
Creatinine, Ser: 0.78 mg/dL (ref 0.44–1.00)
GFR calc Af Amer: >60 mL/min (ref >60)
GFR calc non Af Amer: >60 mL/min (ref >60)
Glucose, Bld: 86 mg/dL (ref 70–99)
Potassium: 3.7 mmol/L (ref 3.5–5.1)
Sodium: 154 mmol/L — ABNORMAL HIGH (ref 135–145)
Total Bilirubin: 0.9 mg/dL (ref 0.3–1.2)
Total Protein: 5.2 g/dL — ABNORMAL LOW (ref 6.5–8.1)

## 2019-11-25 LAB — MRSA PCR SCREENING: MRSA by PCR: NEGATIVE

## 2019-11-25 LAB — BLOOD GAS, VENOUS
Acid-Base Excess: 0.3 mmol/L (ref 0.0–2.0)
Bicarbonate: 23.3 mmol/L (ref 20.0–28.0)
O2 Saturation: 99.9 %
Patient temperature: 37
pCO2, Ven: 32 mmHg — ABNORMAL LOW (ref 44.0–60.0)
pH, Ven: 7.47 — ABNORMAL HIGH (ref 7.250–7.430)
pO2, Ven: 239 mmHg — ABNORMAL HIGH (ref 32.0–45.0)

## 2019-11-25 LAB — PHOSPHORUS: Phosphorus: 2.2 mg/dL — ABNORMAL LOW (ref 2.5–4.6)

## 2019-11-25 LAB — FERRITIN: Ferritin: 230 ng/mL (ref 11–307)

## 2019-11-25 LAB — GLUCOSE, CAPILLARY
Glucose-Capillary: 106 mg/dL — ABNORMAL HIGH (ref 70–99)
Glucose-Capillary: 115 mg/dL — ABNORMAL HIGH (ref 70–99)
Glucose-Capillary: 96 mg/dL (ref 70–99)
Glucose-Capillary: 96 mg/dL (ref 70–99)
Glucose-Capillary: 97 mg/dL (ref 70–99)

## 2019-11-25 LAB — MAGNESIUM: Magnesium: 2.2 mg/dL (ref 1.7–2.4)

## 2019-11-25 LAB — FOLATE: Folate: 2.5 ng/mL — ABNORMAL LOW (ref >5.9)

## 2019-11-25 LAB — IRON AND TIBC
Iron: 117 ug/dL (ref 28–170)
Saturation Ratios: 61 % — ABNORMAL HIGH (ref 10.4–31.8)
TIBC: 192 ug/dL — ABNORMAL LOW (ref 250–450)
UIBC: 75 ug/dL

## 2019-11-25 LAB — VITAMIN B12: Vitamin B-12: 161 pg/mL — ABNORMAL LOW (ref 180–914)

## 2019-11-25 MED ORDER — SODIUM CHLORIDE 0.9 % IV SOLN
1.0000 mg | Freq: Every day | INTRAVENOUS | Status: DC
  Administered 2019-11-25 – 2019-11-30 (×6): 1 mg via INTRAVENOUS
  Filled 2019-11-25 (×8): qty 0.2

## 2019-11-25 MED ORDER — THIAMINE HCL 100 MG/ML IJ SOLN
500.0000 mg | Freq: Three times a day (TID) | INTRAVENOUS | Status: AC
  Administered 2019-11-25 – 2019-11-28 (×7): 500 mg via INTRAVENOUS
  Filled 2019-11-25 (×9): qty 5

## 2019-11-25 MED ORDER — CYANOCOBALAMIN 1000 MCG/ML IJ SOLN
1000.0000 ug | Freq: Once | INTRAMUSCULAR | Status: AC
  Administered 2019-11-25: 1000 ug via INTRAMUSCULAR
  Filled 2019-11-25: qty 1

## 2019-11-25 MED ORDER — CYANOCOBALAMIN 1000 MCG/ML IJ SOLN
1000.0000 ug | Freq: Every day | INTRAMUSCULAR | Status: DC
  Administered 2019-11-27 – 2019-12-01 (×5): 1000 ug via INTRAMUSCULAR
  Filled 2019-11-25 (×6): qty 1

## 2019-11-25 MED ORDER — SALINE SPRAY 0.65 % NA SOLN
1.0000 | NASAL | Status: DC | PRN
  Filled 2019-11-25: qty 44

## 2019-11-25 MED ORDER — THIAMINE HCL 100 MG/ML IJ SOLN
250.0000 mg | INTRAVENOUS | Status: DC
  Administered 2019-11-28 – 2019-11-30 (×3): 250 mg via INTRAVENOUS
  Filled 2019-11-25 (×4): qty 2.5

## 2019-11-25 NOTE — NC FL2 (Signed)
[place] LEVEL OF CARE SCREENING TOOL     IDENTIFICATION  Patient Name: Brianna Woods Birthdate: 10-06-1934 Sex: female Admission Date (Current Location): 11/22/2019  [place] and [place] Number:  Engineering geologist and Address:  [facility], [address] [place],  [address]      Provider Number: [id number]  Attending Physician Name and Address:  Elodia Florence., *  Relative Name and Phone Number:  Bahja Doring - son [phone]    Current Level of Care: Hospital Recommended Level of Care: [place] Prior Approval Number:    Date Approved/Denied:   PASRR Number: [id number]  Discharge Plan: SNF    Current Diagnoses: Patient Active Problem List   Diagnosis Date Noted  . Hypernatremia   . Altered mental status 11/23/2019  . Encephalopathy   . Somnolence 10/13/2019  . Low back pain 02/11/2019  . Acquired hypothyroidism 02/11/2019  . Injury of face 11/21/2018  . MDD (major depressive disorder), single episode, moderate ([place]) 07/09/2018  . Glaucoma   . Osteopenia 06/09/2018  . Hiatal hernia 06/09/2018  . Diverticulosis 06/09/2018  . Weight loss 05/23/2018  . Dysphagia 05/23/2018  . Insomnia 05/23/2018  . Uterine mass 05/23/2018  . Tricompartment osteoarthritis of right knee 02/13/2018  . Skin rash 04/21/2016  . Increased endometrial stripe thickness 10/21/2015  . Nephrolithiasis   . Pedal edema 09/25/2015  . Generalized abdominal pain 07/26/2015  . Kidney cyst, acquired 07/13/2015  . CAD (coronary artery disease) 07/13/2015  . Atherosclerosis of abdominal aorta ([place]) 07/13/2015  . Fatty pancreas 07/13/2015  . Health maintenance examination 03/22/2015  . Intertrigo 03/22/2015  . Advanced care planning/counseling discussion 09/21/2014  . Medicare annual wellness visit, subsequent 03/20/2014  . Urinary incontinence 03/09/2014  . Chest discomfort 08/15/2013  . Anxiety attack 05/18/2012  .  History of right bundle branch block   . Vitamin D deficiency 02/10/2012  . Dizziness 01/22/2012  . Fibromyalgia   . History of CVA (cerebrovascular accident)   . Asthma   . Nodular goiter, toxic or with hyperthyroidism   . GERD (gastroesophageal reflux disease)   . Mixed incontinence   . HTN (hypertension)   . HLD (hyperlipidemia)     Orientation RESPIRATION BLADDER Height & Weight        Normal Incontinent Weight: 62.6 kg Height:  5\' 2"  (157.5 cm)  BEHAVIORAL SYMPTOMS/MOOD NEUROLOGICAL BOWEL NUTRITION STATUS      Continent Diet(to be determines- see discharge summary for diet)  AMBULATORY STATUS COMMUNICATION OF NEEDS Skin   Extensive Assist Verbally Bruising, Skin abrasions(both arms bruising and skin excoriation to mid abd)                       Personal Care Assistance Level of Assistance  Bathing, Feeding, Dressing Bathing Assistance: Maximum assistance Feeding assistance: Limited assistance Dressing Assistance: Maximum assistance     Functional Limitations Info             SPECIAL CARE FACTORS FREQUENCY  PT (By licensed PT), OT (By licensed OT)     PT Frequency: 5 times per week OT Frequency: 5 times per week            Contractures Contractures Info: Not present    Additional Factors Info  Code Status, Allergies Code Status Info: DNR Allergies Info: IVP Dye, PCN, Statins           Current Medications (11/25/2019):  This is the current hospital active medication list  Current Facility-Administered Medications  Medication Dose Route Frequency Provider Last Rate Last Admin  . acetaminophen (TYLENOL) tablet 650 mg  650 mg Oral Q6H PRN Mansy, Jan A, MD       Or  . acetaminophen (TYLENOL) suppository 650 mg  650 mg Rectal Q6H PRN Mansy, Jan A, MD      . ascorbic acid (VITAMIN C) tablet 500 mg  500 mg Oral Daily Mansy, Jan A, MD      . atorvastatin (LIPITOR) tablet 40 mg  40 mg Oral Q M,W,F Mansy, Jan A, MD      . ceFEPIme (MAXIPIME) 2 g in  sodium chloride 0.9 % 100 mL IVPB  2 g Intravenous Q12H Mansy, Jan A, MD 200 mL/hr at 11/25/19 0641 2 g at 11/25/19 0641  . Chlorhexidine Gluconate Cloth 2 % PADS 6 each  6 each Topical Daily Elodia Florence., MD   6 each at 11/24/19 1157  . dextrose 10 % 1,000 mL with potassium chloride 20 mEq infusion   Intravenous Continuous Elodia Florence., MD 100 mL/hr at 11/25/19 0635 New Bag at 11/25/19 [id number]  . divalproex (DEPAKOTE SPRINKLE) capsule 250 mg  250 mg Oral BID Mansy, Jan A, MD      . famotidine (PEPCID) tablet 40 mg  40 mg Oral QHS Mansy, Jan A, MD      . gabapentin (NEURONTIN) capsule 300 mg  300 mg Oral QHS Mansy, Jan A, MD      . ketorolac (TORADOL) 15 MG/ML injection 15 mg  15 mg Intravenous Q6H PRN Sharion Settler, NP      . levothyroxine (SYNTHROID, LEVOTHROID) injection 12.5 mcg  12.5 mcg Intravenous q AM Nazari, Walid A, RPH   12.5 mcg at 11/25/19 0636  . LORazepam (ATIVAN) injection 0.5 mg  0.5 mg Intravenous Q4H PRN Elodia Florence., MD   0.5 mg at 11/24/19 1141  . LORazepam (ATIVAN) tablet 1 mg  1 mg Oral QHS PRN Mansy, Jan A, MD      . magnesium hydroxide (MILK OF MAGNESIA) suspension 30 mL  30 mL Oral Daily PRN Mansy, Jan A, MD      . metoprolol succinate (TOPROL-XL) 24 hr tablet 25 mg  25 mg Oral BID Mansy, Jan A, MD      . morphine 2 MG/ML injection 1 mg  1 mg Intravenous Q4H PRN Sharion Settler, NP   1 mg at 11/24/19 0959  . ondansetron (ZOFRAN) tablet 4 mg  4 mg Oral Q6H PRN Mansy, Jan A, MD       Or  . ondansetron [facility]) injection 4 mg  4 mg Intravenous Q6H PRN Mansy, Jan A, MD      . sodium chloride (OCEAN) 0.65 % nasal spray 1 spray  1 spray Each Nare PRN Elodia Florence., MD      . traZODone (DESYREL) tablet 50 mg  50 mg Oral QHS Mansy, Arvella Merles, MD         Discharge Medications: Please see discharge summary for a list of discharge medications.  Relevant Imaging Results:  Relevant Lab Results:   Additional Information SS#  [id number]  Shelbie Hutching, RN

## 2019-11-25 NOTE — TOC Initial Note (Signed)
Transition of Care [facility]) - Initial/Assessment Note    Patient Details  Name: Brianna Woods MRN: [id number] Date of Birth: 09-04-1935  Transition of Care [facility]) CM/SW Contact:    Shelbie Hutching, RN Phone Number: 11/25/2019, 10:11 AM  Clinical Narrative:                 Patient admitted with hypernatremia and encephalopathy.  Patient has been at [facility] since early in February, before that she lived alone but had declined to the point where she could not care for herself any longer.   RNCM spoke with patient's daughter in law [place] via phone, Ventura Sellers reports that the family would like for the patient to be hospice care since she has not improved any since being admitted to [facility].  Ventura Sellers reports that the family cannot care for the patient at home and would like for her to return to [facility] at discharge with hospice.  Palliative consult has been ordered.    Expected Discharge Plan: Skilled Nursing Facility Barriers to Discharge: Continued Medical Work up   Patient Goals and CMS Choice Patient states their goals for this hospitalization and ongoing recovery are:: Family would like for patient to be hospice CMS Medicare.gov Compare Post Acute Care list provided to:: Patient Represenative (must comment) Choice offered to / list presented to : Adult Children  Expected Discharge Plan and Services Expected Discharge Plan: [place] In-house Referral: Hospice / Palliative Care Discharge Planning Services: CM Consult   Living arrangements for the past 2 months: [place]                                      Prior Living Arrangements/Services Living arrangements for the past 2 months: [place] Lives with:: Facility Resident Patient language and need for interpreter reviewed:: Yes        Need for Family Participation in Patient Care: Yes (Comment)(delerium, failure to thrive) Care giver support system in place?: Yes  (comment)(daughter in law and son)   Criminal Activity/Legal Involvement Pertinent to Current Situation/Hospitalization: No - Comment as needed  Activities of Daily Living      Permission Sought/Granted Permission sought to share information with : Case Freight forwarder, Customer service manager, Family Supports Permission granted to share information with : Yes, Verbal Permission Granted     Permission granted to share info w AGENCY: [facility]  Permission granted to share info w Relationship: Daughter in Sports coach and son     Emotional Assessment Appearance:: Appears stated age       Alcohol / Substance Use: Not Applicable Psych Involvement: No (comment)  Admission diagnosis:  Hypokalemia [E87.6] Hypernatremia [E87.0] Altered mental status [R41.82] Hypoglycemia [E16.2] Encephalopathy [G93.40] Hypothermia, initial encounter [T68.XXXA] Patient Active Problem List   Diagnosis Date Noted  . Hypernatremia   . Altered mental status 11/23/2019  . Encephalopathy   . Somnolence 10/13/2019  . Low back pain 02/11/2019  . Acquired hypothyroidism 02/11/2019  . Injury of face 11/21/2018  . MDD (major depressive disorder), single episode, moderate ([place]) 07/09/2018  . Glaucoma   . Osteopenia 06/09/2018  . Hiatal hernia 06/09/2018  . Diverticulosis 06/09/2018  . Weight loss 05/23/2018  . Dysphagia 05/23/2018  . Insomnia 05/23/2018  . Uterine mass 05/23/2018  . Tricompartment osteoarthritis of right knee 02/13/2018  . Skin rash 04/21/2016  . Increased endometrial stripe thickness 10/21/2015  . Nephrolithiasis   . Pedal  edema 09/25/2015  . Generalized abdominal pain 07/26/2015  . Kidney cyst, acquired 07/13/2015  . CAD (coronary artery disease) 07/13/2015  . Atherosclerosis of abdominal aorta ([place]) 07/13/2015  . Fatty pancreas 07/13/2015  . Health maintenance examination 03/22/2015  . Intertrigo 03/22/2015  . Advanced care planning/counseling discussion 09/21/2014  . Medicare  annual wellness visit, subsequent 03/20/2014  . Urinary incontinence 03/09/2014  . Chest discomfort 08/15/2013  . Anxiety attack 05/18/2012  . History of right bundle branch block   . Vitamin D deficiency 02/10/2012  . Dizziness 01/22/2012  . Fibromyalgia   . History of CVA (cerebrovascular accident)   . Asthma   . Nodular goiter, toxic or with hyperthyroidism   . GERD (gastroesophageal reflux disease)   . Mixed incontinence   . HTN (hypertension)   . HLD (hyperlipidemia)    PCP:  Ria Bush, MD Pharmacy:   [id number] - [place], [place] [address] [place] [address] Phone: [phone] Fax: [facility] [place], [place] [address] Phone: [phone] Fax: [phone]     Social Determinants of Health (SDOH) Interventions    Readmission Risk Interventions No flowsheet data found.

## 2019-11-25 NOTE — Progress Notes (Signed)
SLP Cancellation Note  Patient Details Name: Brianna Woods MRN: [id number] DOB: 05-12-1935   Cancelled treatment:       Reason Eval/Treat Not Completed: Patient not medically ready;Medical issues which prohibited therapy;Fatigue/lethargy limiting ability to participate(chart reviewed; consulted NSG then attempted w/ pt). Upon entering the room, the pt was turned sideways in the bed w/ her head hanging near the side. She was Not aware of her situation and the need to position correctly in bed to avoid falling out. She was agitated and resistive to repositioning by SLP and [facility] staff. Pt's eyes remained closed, arms raised in the air. Muttered speech and loud phonations noted x2; no verbal responses to basic questions. After positioning, pt turned onto her R side and covered head w/ arms. She was resistive to moving her arms away from her face for any attempts at oral care or po trials of ice chips.  Due to her Poor Cognitive awareness, pt is at High risk for aspiration/choking w/ any oral intake despite precautions. Recommend continue current NPO status w/ frequent oral care as able to promote hygiene and stimulation of swallowing. ST services will f/u pt' status next 1-2 days. Precautions posted. MD and NSG updated.     Orinda Kenner, MS, CCC-SLP Watson,Katherine 11/25/2019, 11:43 AM

## 2019-11-25 NOTE — Consult Note (Signed)
Consultation Note Date: 11/25/19  Patient Name: Brianna Woods  DOB: [birth year]  MRN: [id number]  Age / Sex: 85 y.o., female  PCP: Ria Bush, MD Referring Physician: Val Riles, MD  Reason for Consultation: Establishing goals of care  HPI/Patient Profile: 85 y.o. female  with past medical history of asthma, CAD, fibromyalgia, HTN, dyslipidemia admitted on 11/22/2019 with altered mental status and abnormal labs from [facility]. Hospital admission for acute metabolic encephalopathy, hypothermia, hypoglycemia. Antibiotics started empirically, workup does not reveal infection. Labs revealed vitamin B12 and folate deficiency. CT head negative for acute findings, but age-related atrophy and chronic microvascular ischemic changes, old left inferior frontal infarct and encephalomalacia. Patient remains encephalopathic and unable to work with PT/SLP. Palliative medicine consultation for goals of care.   Clinical Assessment and Goals of Care:  I have reviewed medical records, discussed with Dr. Florene Glen, and assessed that patient at bedside. She is awake but disoriented and agitated. Pulling at lines. Safety mitts in place. Repositioned with SLP at bedside. She will not participate in SLP evaluation and unsafe to take oral medications. No family at bedside.   Spoke with son, [name] via telephone to discuss goals of care.   I introduced Palliative Medicine as specialized medical care for people living with serious illness. It focuses on providing relief from the symptoms and stress of a serious illness. The goal is to improve quality of life for both the patient and the family.  We discussed a brief life review of the patient. [name] moved his mother down from [place] ~10 years ago. She has been living independently with checks from [place] and his brother, but with declining functional/cognitive/nutritional  status in the last few months. Has not been officially diagnosed with dementia.   Discussed events leading up to hospitalization and course of hospitalization including diagnoses, interventions, plan of care. Explained concern with ongoing altered mental status, poor oral intake, and inability to progress with PT/SLP. Reviewed CT results, expressing concern of diagnosis of dementia contributing to overall failure to thrive.   I attempted to elicit values and goals of care important to the patient and son. Advanced directives, concepts specific to code status, artifical feeding and hydration, and rehospitalization were considered and discussed. [name] shares that his mother discussed her 'final wishes' a few years ago when lucid. He shares she would NOT want machines or surgeries. [name] confirms DNR/DNI code status.   We discussed feeding tube and recommendation against artificial nutrition (even short-term cortrak) with her current mental status and risk that it will be pulled out quickly and cause more agitation/discomfort. [name] agrees that it would not be beneficial to place feeding tube. Discussed comfort feeds.   The difference between aggressive medical intervention and comfort care was considered. Introduced hospice philosophy and options. [name] plans to visit the hospital this evening and see if she will respond better with him at bedside. He is strongly considering initiation of hospice services at SNF, sharing he does not want her transferred all around the state.  Questions and concerns  were addressed. [name] and I plan to f/u regarding GOC/hospice tomorrow.     SUMMARY OF RECOMMENDATIONS    Son confirms DNR/DNI code status. NO feeding tube placement.   Continue current plan of care and medical management.  Son considering initiation of hospice services at SNF.  PMT provider will f/u with son tomorrow, 3/17.  Code Status/Advance Care Planning:  DNR  Symptom  Management:   Per attending  Palliative Prophylaxis:   Aspiration, Delirium Protocol, Oral Care and Turn Reposition   Psycho-social/Spiritual:   Desire for further Chaplaincy support: yes  Additional Recommendations: Caregiving  Support/Resources, Compassionate Wean Education and Education on Hospice  Prognosis:   Poor prognosis with declining functional/cognitive/nutritional status. Overall failure to thrive secondary to dementia.  Discharge Planning: To Be Determined: Possibly back to SNF with hospice services      Primary Diagnoses: Present on Admission: . Altered mental status   I have reviewed the medical record, interviewed the patient and family, and examined the patient. The following aspects are pertinent.  Past Medical History:  Diagnosis Date  . Anemia   . Arthritis   . Asthma    per pt  . Atherosclerosis of abdominal aorta ([place]) 07/2015   by CT  . Barrett's esophagus    on EGD 2008, EGD WNL 2013  . CAD (coronary artery disease) 07/2015   of LAD by CT  . Diverticulosis 2014   sigmoid on colonoscopy  . Fatty pancreas 07/2015   by CT  . Fibromyalgia    per pt, no records of this   . GERD (gastroesophageal reflux disease)    and esoph stricture s/p dilation 2008 HH by CT 2016  . Glaucoma   . History of anxiety    was on prozac then effexor (pt denies h/o anxiety/depression)  . History of cardiac murmur   . History of chicken pox   . History of CVA (cerebrovascular accident) 2008   "I've had several TIAs"  . History of right bundle branch block 2011  . History of ulcer disease    per pt, no records of this  . HLD (hyperlipidemia)   . HTN (hypertension)   . Kidney cyst, acquired 07/2015   by CT, rec rpt renal MRI in 6 months  . Mixed incontinence    on ditropan  . Nephrolithiasis    s/p surgery R kidney (55mm) 11/2009  . Nodular goiter, toxic or with hyperthyroidism    h/o toxic, on methimazole, known large left thyroid nodule 08/2013 s/p beign  biopsy per patient 2010, saw Dr. Gabriel Carina, Rad I ablation 04/2014  . Vitamin D deficiency    Social History   Socioeconomic History  . Marital status: Widowed    Spouse name: Not on file  . Number of children: Not on file  . Years of education: Not on file  . Highest education level: Not on file  Occupational History  . Not on file  Tobacco Use  . Smoking status: Never Smoker  . Smokeless tobacco: Never Used  Substance and Sexual Activity  . Alcohol use: No    Alcohol/week: 0.0 standard drinks  . Drug use: No  . Sexual activity: Never  Other Topics Concern  . 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 Difficulty of Paying Living Expenses:   Food Insecurity:   . Worried About Charity fundraiser in the Last Year:   . Arboriculturist in the Last Year:   Transportation Needs:   . Film/video editor (Medical):   Marland Kitchen Lack of Transportation (Non-Medical):   Physical Activity:   . Days of Exercise per Week:   . Minutes of Exercise per Session:   Stress:   . Feeling of Stress :   Social Connections:   . Frequency of Communication with Friends and Family:   . Frequency of Social Gatherings with Friends and Family:   . Attends Religious Services:   . Active Member of Clubs or Organizations:   . Attends Archivist Meetings:   Marland Kitchen Marital Status:    Family History  Problem Relation Age of Onset  . Hyperlipidemia Mother   . Hypertension Mother   . Stroke Father        hemorrhage  . Aneurysm Father 42       deceased  . Other Brother        TB  . Coronary artery disease Brother   . Aneurysm Son 48       sudden death  . Diabetes  Maternal Aunt   . Cancer Brother 4       lung   Scheduled Meds: . vitamin C  500 mg Oral Daily  . atorvastatin  40 mg Oral Q M,W,F  . Chlorhexidine Gluconate Cloth  6 each Topical Daily  . cyanocobalamin  1,000 mcg Intramuscular Daily  . divalproex  250 mg Oral BID  . famotidine  40 mg Oral QHS  . gabapentin  300 mg Oral QHS  . levothyroxine  12.5 mcg Intravenous q AM  . metoprolol succinate  25 mg Oral BID  . traZODone  50 mg Oral QHS   Continuous Infusions: . ceFEPime (MAXIPIME) IV 2 g (11/26/19 0706)  . dextrose 10 % 1,000 mL with potassium chloride 20 mEq infusion 150 mL/hr at 11/26/19 0401  . folic acid (FOLVITE) IVPB 1 mg (11/25/19 1643)  . potassium PHOSPHATE IVPB (in mmol)    . thiamine injection 500 mg (11/25/19 2212)   Followed by  . [START ON 11/28/2019] thiamine injection     PRN Meds:.acetaminophen **OR** acetaminophen, ketorolac, LORazepam, LORazepam, magnesium hydroxide, morphine injection, ondansetron **OR** ondansetron (ZOFRAN) IV, sodium chloride Medications Prior to Admission:  Prior to Admission medications   Medication Sig Start Date End Date Taking? Authorizing Provider  atorvastatin (LIPITOR) 40 MG tablet TAKE 1 TABLET ON MONDAY, [place] Patient taking differently: Take 40 mg by mouth every Monday, Wednesday, and Friday.  07/29/19  Yes Ria Bush, MD  divalproex (DEPAKOTE SPRINKLE) 125 MG capsule Take 250 mg by mouth 2 (two) times daily.   Yes [provider]  famotidine (PEPCID) 40 MG tablet TAKE 1 TABLET (40 MG TOTAL) BY MOUTH AT BEDTIME (REPLACES ZANTAC) Patient taking differently: Take 40 mg by mouth at bedtime.  09/24/19  Yes Ria Bush, MD  gabapentin (NEURONTIN) 300 MG capsule Take 1 capsule (300 mg total) by mouth at bedtime. 10/13/19  Yes Ria Bush, MD  levothyroxine (SYNTHROID) 25 MCG tablet Take 25 mcg by mouth daily before breakfast.   Yes [provider]  LORazepam (ATIVAN) 1 MG tablet Take 1 mg  by mouth at bedtime as  needed for anxiety.   Yes [provider]  losartan (COZAAR) 100 MG tablet Take 1 tablet (100 mg total) by mouth daily. 10/13/19  Yes Ria Bush, MD  metoprolol succinate (TOPROL-XL) 25 MG 24 hr tablet Take 25 mg by mouth 2 (two) times daily.   Yes [provider]  pantoprazole (PROTONIX) 40 MG tablet Take 80 mg by mouth daily.   Yes [provider]  potassium chloride 20 MEQ/15ML (10%) SOLN Take 20 mEq by mouth 2 (two) times daily.   Yes [provider]  traZODone (DESYREL) 100 MG tablet TAKE 1/2 TO 1 TABLET AT BEDTIME Patient taking differently: Take 50-100 mg by mouth at bedtime.  09/26/19  Yes Ria Bush, MD  vitamin C (ASCORBIC ACID) 500 MG tablet Take 500 mg by mouth daily.   Yes [provider]  esomeprazole (NEXIUM) 40 MG capsule TAKE 1 CAPSULE EVERY DAY AT 12 NOON Patient not taking: No sig reported 07/29/19   Ria Bush, MD  levothyroxine (SYNTHROID) 50 MCG tablet Take 50 mcg by mouth daily before breakfast.    [provider]  pirbuterol (MAXAIR) 200 MCG/INH inhaler Inhale 2 puffs into the lungs 4 (four) times daily. 09/28/11 01/22/12  Ria Bush, MD   Allergies  Allergen Reactions  . Ivp Dye [Iodinated Diagnostic Agents]     Turns red; BP and HR go up  . Penicillins     "Sends me in left field"  . Statins Other (See Comments)    Body aches (lipitor, simvastatin, pravastatin)   Review of Systems  Unable to perform ROS: Dementia   Physical Exam Vitals and nursing note reviewed.  Constitutional:      General: She is awake.     Appearance: She is ill-appearing.  HENT:     Head: Normocephalic and atraumatic.  Cardiovascular:     Rate and Rhythm: Normal rate.  Pulmonary:     Effort: No tachypnea, accessory muscle usage or respiratory distress.     Breath sounds: Normal breath sounds.  Abdominal:     Tenderness: There is no abdominal tenderness.  Skin:    General: Skin is  warm and dry.  Neurological:     Comments: Awake, disoriented, agitated. Not following commands.  Psychiatric:        Attention and Perception: She is inattentive.        Speech: She is noncommunicative.        Cognition and Memory: Cognition is impaired.    Vital Signs: BP (!) 156/90 (BP Location: Right Leg)   Pulse 88   Temp 98.2 F (36.8 C)   Resp 18   Ht 5\' 2"  (1.575 m)   Wt 62.6 kg   SpO2 100%   BMI 25.24 kg/m  Pain Scale: 0-10   Pain Score: 0-No pain   SpO2: SpO2: 100 % O2 Device:SpO2: 100 % O2 Flow Rate: .   IO: Intake/output summary:   Intake/Output Summary (Last 24 hours) at 11/26/2019 0857 Last data filed at 11/25/2019 1832 Gross per 24 hour  Intake 1371.64 ml  Output --  Net 1371.64 ml    LBM: Last BM Date: (unknown) Baseline Weight: Weight: 62.6 kg Most recent weight: Weight: 62.6 kg     Palliative Assessment/Data: PPS 20%   Flowsheet Rows     Most Recent Value  Intake Tab  Referral Department  Hospitalist  Unit at Time of Referral  Med/Surg Unit  Palliative Care Primary Diagnosis  Neurology  Palliative Care Type  New Palliative care  Reason for referral  Clarify Goals of Care  Date first seen by Palliative Care  11/25/19  Clinical Assessment  Palliative Performance Scale Score  20%  Psychosocial & Spiritual Assessment  Palliative Care Outcomes  Patient/Family meeting held?  Yes  Who was at the meeting?  son via telephone  Palliative Care Outcomes  Clarified goals of care, Counseled regarding hospice, Provided end of life care assistance, Provided psychosocial or spiritual support, ACP counseling assistance      Time In: 1330 Time Out: 1440 Time Total: 70 Greater than 50%  of this time was spent counseling and coordinating care related to the above assessment and plan.  Signed by:  Ihor Dow, DNP, FNP-C Palliative Medicine Team  Phone: [phone] Fax: [phone]  Please contact Palliative Medicine Team phone at [phone] for  questions and concerns.  For individual provider: See Shea Evans

## 2019-11-25 NOTE — Progress Notes (Addendum)
PROGRESS NOTE    Brianna Woods  UTM:[id number] DOB: 08/24/1935 DOA: 11/22/2019 PCP: Ria Bush, MD   Brief Narrative:  Brianna Woods  is Brianna Woods 85 y.o. African-American female with Brianna Woods known history of multiple medical problems that are mentioned below, including asthma, coronary artery disease, fibromyalgia, hypertension, dyslipidemia, who presented to the emergency room with acute onset of altered mental status and abnormal labs at [facility].  She is ambulatory at baseline over the last few days she not been able to ambulate.  She is been having significantly diminished appetite and decreased p.o. intake.  She had labs drawn revealed hypernatremia that was up to 172 and hypokalemia as well as hyperchloremia.  She was also hypoglycemic with Brianna Woods blood glucose of 44 that was up to 62 here on arrival.  No reported nausea or vomiting or diarrhea or abdominal pain.  No reported chest pain dyspnea or cough or wheezing.  The patient was nonverbal during my interview and fairly somnolent and therefore no history could be obtained from her.  She's been admitted for acute metabolic encephalopathy, hypothermia, and hypoglycemia.  She's been treated with antibiotics empirically for presumed infection, though no definitive evidence yet.  Her labs have shown vitamin b12 and folate deficiency, which are now being replaced.  Palliative care has been consulted due to her recent decline.   Assessment & Plan:   Active Problems:   Altered mental status   Encephalopathy   Hypernatremia  Goals of Care:  Patient seems to have been declining over the past several weeks.  Moved in with son b/c not doing well independently, found down Brianna Woods few times.  She lived with son, but declined with him as well, falling asleep in conversations, etc.  [facility]  Updated that she wasn't eating or drinking, not acting herself, abnormal labs and transferred here. Discussed code status with son, Brianna Woods who  noted that he thinks she would want to be DNR Will c/s palliative care as it seems she's failing to thrive - appreciate assistance  1.  SIRS  Hypothermia  Hypotension  AMS  R/o Sepsis  - intermittent hypothermia, persists - follow AM cortisol (18.1), TSH 6.814 with normal free T4 - Continue empiric IV abx - narrow to cefepime, will continue for now - UA does not appear c/w UTI, CXR without acute abnormality - follow blood and urine cultures NG - MRSA pcr negative  Acute metabolic encephalopathy - her encephalopathy is persistent, but slightly improved -> she's still significantly disoriented, unable to communicate clearly to me this morning, incomprehensible speech - TSH as above, with vitamin b12 deficiency and folate deficiency, will replace, VBG - high dose thiamine - delirium precautions - prolonged qt on EKG -> repeat EKG pending - ativan for agitation for now  # Hypothermia:  - follow cortisol, TSH 6.814 with normal free T4 - she's hypoglycemic, which is likely contributing - improved after bair hugger  2.  Hypoglycemia. - Continue to monitor on D5 fluids - Q4 POC BG checks  # Hypernatremia  Hyperchloremia: 2/2 dehydration, follow on hypotonic fluids (with hyperchloremia, ? Related to NS administration as well?) - was gradually improving, stalled, increase rate of fluids  # Hypophosphatemia: replace and follow  # Hypokalemia:  Replace and follow   4.  Hypothyroidism. -We will continue Synthroid.  5.  Hypertension. -We will hold off Cozaar and resume Toprol-XL with adequate BP.  6.  GERD. -We will continue PPI therapy as well as H2 blocker.  7.  Peripheral neuropathy. -We will continue Neurontin.  8.  Dyslipidemia. -We will continue statin therapy.  # Anemia  Thrombocytopenia: thrombocytopenia is new, could be related to sepsis with hypothermia, continue to monitor.   B12 and folate def likely contributing Follow iron panel, ferritin   DVT  prophylaxis: SCD Code Status: DNR Family Communication: son no answer 3/15 Disposition Plan:  . Patient came from: SNF            . Anticipated d/c place:SNF . Barriers to d/c OR conditions which need to be met to effect Brianna Woods safe d/c: pending improvement in electrolyte abnormalities and AMS, palliative care c/s   Consultants:   Palliative care  Procedures:   none  Antimicrobials:  Anti-infectives (From admission, onward)   Start     Dose/Rate Route Frequency Ordered Stop   11/24/19 0630  vancomycin (VANCOREADY) IVPB 750 mg/150 mL  Status:  Discontinued     750 mg 150 mL/hr over 60 Minutes Intravenous Every 24 hours 11/24/19 0644 11/24/19 1118   11/23/19 2200  vancomycin (VANCOREADY) IVPB 750 mg/150 mL  Status:  Discontinued     750 mg 150 mL/hr over 60 Minutes Intravenous Every 24 hours 11/23/19 0838 11/24/19 0644   11/23/19 0600  metroNIDAZOLE (FLAGYL) IVPB 500 mg  Status:  Discontinued     500 mg 100 mL/hr over 60 Minutes Intravenous Every 8 hours 11/23/19 0439 11/23/19 1258   11/23/19 0515  ceFEPIme (MAXIPIME) 2 g in sodium chloride 0.9 % 100 mL IVPB     2 g 200 mL/hr over 30 Minutes Intravenous Every 12 hours 11/23/19 0439     11/23/19 0445  vancomycin (VANCOCIN) IVPB 1000 mg/200 mL premix  Status:  Discontinued     1,000 mg 200 mL/hr over 60 Minutes Intravenous  Once 11/23/19 0439 11/23/19 0507   11/22/19 2330  vancomycin (VANCOCIN) IVPB 1000 mg/200 mL premix     1,000 mg 200 mL/hr over 60 Minutes Intravenous  Once 11/22/19 2324 11/23/19 0315   11/22/19 2330  vancomycin (VANCOREADY) IVPB 500 mg/100 mL     500 mg 100 mL/hr over 60 Minutes Intravenous  Once 11/22/19 2328 11/23/19 0513   11/22/19 2230  meropenem (MERREM) 1 g in sodium chloride 0.9 % 100 mL IVPB     1 g 200 mL/hr over 30 Minutes Intravenous  Once 11/22/19 2219 11/22/19 2313     Subjective: Moaning, incomprehensible speech  Objective: Vitals:   11/24/19 2350 11/25/19 0828 11/25/19 0922 11/25/19 1014   BP: 109/82 (!) 158/87 (!) 154/82 (!) 150/73  Pulse: 83  (!) 103   Resp: (!) 21   18  Temp: 97.7 F (36.5 C) 98.2 F (36.8 C)  98.3 F (36.8 C)  TempSrc: Rectal Axillary  Rectal  SpO2: 97%   100%  Weight:      Height:        Intake/Output Summary (Last 24 hours) at 11/25/2019 1454 Last data filed at 11/25/2019 0400 Gross per 24 hour  Intake 2350.59 ml  Output --  Net 2350.59 ml   Filed Weights   11/22/19 2157 11/22/19 2251  Weight: 62.6 kg 62.6 kg    Examination:  General: agitated, lethargic  Cardiovascular: Heart sounds show Katelind Pytel regular rate, and rhythm.  Lungs: Clear to auscultation bilaterally  Abdomen: Soft, nontender, nondistended  Neurological: disoriented, restless. Moves all extremities 4 . Cranial nerves II through XII grossly intact. Skin: Warm and dry. No rashes or lesions. Extremities: No clubbing or cyanosis. No edema.  Data Reviewed: I have personally reviewed following labs and imaging studies  CBC: Recent Labs  Lab 11/22/19 2220 11/23/19 0614 11/24/19 0612 11/25/19 0710  WBC 5.2 6.5 7.8 8.1  NEUTROABS 4.0  --  5.0 4.9  HGB 9.7* 8.9* 9.4* 9.8*  HCT 32.9* 29.4* 31.3* 30.7*  MCV 104.8* 102.8* 102.0* 98.4  PLT 75* 65* 59* 63*   Basic Metabolic Panel: Recent Labs  Lab 11/22/19 2220 11/23/19 0041 11/23/19 0614 11/23/19 1416 11/24/19 0612 11/24/19 2031 11/25/19 0710  NA 157*   < > 157* 155* 152* 153* 154*  K 2.4*   < > 2.8* 3.4* 3.4* 3.5 3.7  CL 120*   < > 124* 123* 122* 124* 121*  CO2 27   < > '27 24 26 26 24  ' GLUCOSE 154*   < > 87 58* 97 110* 86  BUN 35*   < > 31* 26* '19 15 13  ' CREATININE 1.15*   < > 1.00 0.98 0.85 0.82 0.78  CALCIUM 8.6*   < > 8.4* 8.4* 8.8* 8.6* 8.8*  MG 2.2  --   --   --  1.7  --  2.2  PHOS  --   --   --   --  1.7*  --  2.2*   < > = values in this interval not displayed.   GFR: Estimated Creatinine Clearance: 47.2 mL/min (by C-G formula based on SCr of 0.78 mg/dL). Liver Function Tests: Recent Labs  Lab  11/22/19 2220 11/24/19 0612 11/25/19 0710  AST '25 28 25  ' ALT '23 24 23  ' ALKPHOS 69 60 64  BILITOT 1.2 0.8 0.9  PROT 5.4* 5.3* 5.2*  ALBUMIN 3.1* 2.8* 2.7*   No results for input(s): LIPASE, AMYLASE in the last 168 hours. No results for input(s): AMMONIA in the last 168 hours. Coagulation Profile: Recent Labs  Lab 11/22/19 2220 11/23/19 0614  INR 1.1 1.1   Cardiac Enzymes: No results for input(s): CKTOTAL, CKMB, CKMBINDEX, TROPONINI in the last 168 hours. BNP (last 3 results) No results for input(s): PROBNP in the last 8760 hours. HbA1C: No results for input(s): HGBA1C in the last 72 hours. CBG: Recent Labs  Lab 11/24/19 1649 11/24/19 2006 11/25/19 0027 11/25/19 0400 11/25/19 1138  GLUCAP 105* 89 97 115* 96   Lipid Profile: No results for input(s): CHOL, HDL, LDLCALC, TRIG, CHOLHDL, LDLDIRECT in the last 72 hours. Thyroid Function Tests: Recent Labs    11/22/19 2220  TSH 6.814*  FREET4 1.03   Anemia Panel: Recent Labs    11/25/19 0710  VITAMINB12 161*  FOLATE 2.5*   Sepsis Labs: Recent Labs  Lab 11/22/19 2220 11/23/19 0614  PROCALCITON <0.10 <0.10  LATICACIDVEN 1.2  --     Recent Results (from the past 240 hour(s))  Urine culture     Status: None   Collection Time: 11/22/19 10:20 PM   Specimen: Urine, Catheterized  Result Value Ref Range Status   Specimen Description   Final    URINE, CATHETERIZED Performed at [facility], [address], [place], [place] [address]    Special Requests   Final    NONE Performed at [facility], [address], [place], [place] [address]    Culture   Final    NO GROWTH Performed at [facility], [place] [address], [place], [place] [address]    Report Status 11/24/2019 FINAL  Final  Blood culture (routine x 2)     Status: None (Preliminary result)  Collection Time: 11/22/19 10:20 PM   Specimen: BLOOD  Result Value Ref Range Status   Specimen Description BLOOD Blood Culture adequate  volume  Final   Special Requests   Final    BOTTLES DRAWN AEROBIC AND ANAEROBIC LEFT ANTECUBITAL   Culture   Final    NO GROWTH 3 DAYS Performed at [facility], [address] [place], [place] [address]    Report Status PENDING  Incomplete  Blood culture (routine x 2)     Status: None (Preliminary result)   Collection Time: 11/22/19 10:20 PM   Specimen: BLOOD  Result Value Ref Range Status   Specimen Description BLOOD LEFT WRIST  Final   Special Requests   Final    BOTTLES DRAWN AEROBIC AND ANAEROBIC Blood Culture adequate volume   Culture   Final    NO GROWTH 3 DAYS Performed at [facility], [address] [place], [place] [address]    Report Status PENDING  Incomplete  Respiratory Panel by RT PCR (Flu Brendalyn Vallely&B, Covid) - Nasopharyngeal Swab     Status: None   Collection Time: 11/23/19 12:41 AM   Specimen: Nasopharyngeal Swab  Result Value Ref Range Status   SARS Coronavirus 2 by RT PCR NEGATIVE NEGATIVE Final    Comment: (NOTE) SARS-CoV-2 target nucleic acids are NOT DETECTED. The SARS-CoV-2 RNA is generally detectable in upper respiratoy specimens during the acute phase of infection. The lowest concentration of SARS-CoV-2 viral copies this assay can detect is 131 copies/mL. Kaysha Parsell negative result does not preclude SARS-Cov-2 infection and should not be used as the sole basis for treatment or other patient management decisions. Abdulrahim Siddiqi negative result may occur with  improper specimen collection/handling, submission of specimen other than nasopharyngeal swab, presence of viral mutation(s) within the areas targeted by this assay, and inadequate number of viral copies (<131 copies/mL). Sharnise Blough negative result must be combined with clinical observations, patient history, and epidemiological information. The expected result is Negative. Fact Sheet for Patients:  PinkCheek.be Fact Sheet for Healthcare Providers:   GravelBags.it This test is not yet ap proved or cleared by the [place] FDA and  has been authorized for detection and/or diagnosis of SARS-CoV-2 by FDA under an Emergency Use Authorization (EUA). This EUA will remain  in effect (meaning this test can be used) for the duration of the COVID-19 declaration under Section 564(b)(1) of the Act, 21 U.S.C. section 360bbb-3(b)(1), unless the authorization is terminated or revoked sooner.    Influenza Damonta Cossey by PCR NEGATIVE NEGATIVE Final   Influenza B by PCR NEGATIVE NEGATIVE Final    Comment: (NOTE) The Xpert Xpress SARS-CoV-2/FLU/RSV assay is intended as an aid in  the diagnosis of influenza from Nasopharyngeal swab specimens and  should not be used as Neely Cecena sole basis for treatment. Nasal washings and  aspirates are unacceptable for Xpert Xpress SARS-CoV-2/FLU/RSV  testing. Fact Sheet for Patients: PinkCheek.be Fact Sheet for Healthcare Providers: GravelBags.it This test is not yet approved or cleared by the [place] FDA and  has been authorized for detection and/or diagnosis of SARS-CoV-2 by  FDA under an Emergency Use Authorization (EUA). This EUA will remain  in effect (meaning this test can be used) for the duration of the  Covid-19 declaration under Section 564(b)(1) of the Act, 21  U.S.C. section 360bbb-3(b)(1), unless the authorization is  terminated or revoked. Performed at [facility], [address] [place], [place] [address]   MRSA PCR Screening     Status: None  Collection Time: 11/25/19  5:36 AM   Specimen: Nasopharyngeal  Result Value Ref Range Status   MRSA by PCR NEGATIVE NEGATIVE Final    Comment:        The GeneXpert MRSA Assay (FDA approved for NASAL specimens only), is one component of Adalia Pettis comprehensive MRSA colonization surveillance program. It is not intended to diagnose MRSA infection nor to guide or monitor  treatment for MRSA infections. Performed at [facility], [address] [place], [place] [address]          Radiology Studies: No results found.      Scheduled Meds: . vitamin C  500 mg Oral Daily  . atorvastatin  40 mg Oral Q M,W,F  . Chlorhexidine Gluconate Cloth  6 each Topical Daily  . divalproex  250 mg Oral BID  . famotidine  40 mg Oral QHS  . gabapentin  300 mg Oral QHS  . levothyroxine  12.5 mcg Intravenous q AM  . metoprolol succinate  25 mg Oral BID  . traZODone  50 mg Oral QHS   Continuous Infusions: . ceFEPime (MAXIPIME) IV 2 g (11/25/19 0641)  . dextrose 10 % 1,000 mL with potassium chloride 20 mEq infusion 100 mL/hr at 11/25/19 0635  . folic acid (FOLVITE) IVPB       LOS: 2 days    Time spent: over 30 min    Fayrene Helper, MD [facility]   To contact the attending provider between 7A-7P or the covering provider during after hours 7P-7A, please log into the web site [email or web address] and access using universal [place] password for that web site. If you do not have the password, please call the hospital operator.  11/25/2019, 2:54 PM

## 2019-11-25 NOTE — Progress Notes (Signed)
PT Cancellation Note  Patient Details Name: Brianna Woods MRN: [id number] DOB: [birth year]   Cancelled Treatment:    Reason Eval/Treat Not Completed: Patient's level of consciousness, Patient is not able to participate with her level of alertness.    Alanson Puls, , PT DPT 11/25/2019, 11:45 AM

## 2019-11-26 DIAGNOSIS — R627 Adult failure to thrive: Secondary | ICD-10-CM

## 2019-11-26 DIAGNOSIS — E876 Hypokalemia: Secondary | ICD-10-CM

## 2019-11-26 DIAGNOSIS — Z515 Encounter for palliative care: Secondary | ICD-10-CM

## 2019-11-26 DIAGNOSIS — E162 Hypoglycemia, unspecified: Secondary | ICD-10-CM

## 2019-11-26 DIAGNOSIS — R4182 Altered mental status, unspecified: Secondary | ICD-10-CM

## 2019-11-26 LAB — MAGNESIUM: Magnesium: 1.8 mg/dL (ref 1.7–2.4)

## 2019-11-26 LAB — GLUCOSE, CAPILLARY
Glucose-Capillary: 102 mg/dL — ABNORMAL HIGH (ref 70–99)
Glucose-Capillary: 115 mg/dL — ABNORMAL HIGH (ref 70–99)
Glucose-Capillary: 128 mg/dL — ABNORMAL HIGH (ref 70–99)
Glucose-Capillary: 83 mg/dL (ref 70–99)
Glucose-Capillary: 83 mg/dL (ref 70–99)
Glucose-Capillary: 89 mg/dL (ref 70–99)

## 2019-11-26 LAB — COMPREHENSIVE METABOLIC PANEL
ALT: 25 U/L (ref 0–44)
AST: 25 U/L (ref 15–41)
Albumin: 2.8 g/dL — ABNORMAL LOW (ref 3.5–5.0)
Alkaline Phosphatase: 63 U/L (ref 38–126)
Anion gap: 2 — ABNORMAL LOW (ref 5–15)
BUN: 11 mg/dL (ref 8–23)
CO2: 31 mmol/L (ref 22–32)
Calcium: 8.7 mg/dL — ABNORMAL LOW (ref 8.9–10.3)
Chloride: 119 mmol/L — ABNORMAL HIGH (ref 98–111)
Creatinine, Ser: 0.71 mg/dL (ref 0.44–1.00)
GFR calc Af Amer: >60 mL/min (ref >60)
GFR calc non Af Amer: >60 mL/min (ref >60)
Glucose, Bld: 101 mg/dL — ABNORMAL HIGH (ref 70–99)
Potassium: 3.4 mmol/L — ABNORMAL LOW (ref 3.5–5.1)
Sodium: 152 mmol/L — ABNORMAL HIGH (ref 135–145)
Total Bilirubin: 0.9 mg/dL (ref 0.3–1.2)
Total Protein: 5.2 g/dL — ABNORMAL LOW (ref 6.5–8.1)

## 2019-11-26 LAB — CBC WITH DIFFERENTIAL/PLATELET
Abs Immature Granulocytes: 0.09 10*3/uL — ABNORMAL HIGH (ref 0.00–0.07)
Basophils Absolute: 0 10*3/uL (ref 0.0–0.1)
Basophils Relative: 0 %
Eosinophils Absolute: 0.1 10*3/uL (ref 0.0–0.5)
Eosinophils Relative: 1 %
HCT: 31.2 % — ABNORMAL LOW (ref 36.0–46.0)
Hemoglobin: 9.8 g/dL — ABNORMAL LOW (ref 12.0–15.0)
Immature Granulocytes: 1 %
Lymphocytes Relative: 22 %
Lymphs Abs: 1.9 10*3/uL (ref 0.7–4.0)
MCH: 30.4 pg (ref 26.0–34.0)
MCHC: 31.4 g/dL (ref 30.0–36.0)
MCV: 96.9 fL (ref 80.0–100.0)
Monocytes Absolute: 1.4 10*3/uL — ABNORMAL HIGH (ref 0.1–1.0)
Monocytes Relative: 17 %
Neutro Abs: 5.2 10*3/uL (ref 1.7–7.7)
Neutrophils Relative %: 59 %
Platelets: 70 10*3/uL — ABNORMAL LOW (ref 150–400)
RBC: 3.22 MIL/uL — ABNORMAL LOW (ref 3.87–5.11)
RDW: 16.6 % — ABNORMAL HIGH (ref 11.5–15.5)
WBC: 8.7 10*3/uL (ref 4.0–10.5)
nRBC: 0 % (ref 0.0–0.2)

## 2019-11-26 LAB — VITAMIN D 25 HYDROXY (VIT D DEFICIENCY, FRACTURES): Vit D, 25-Hydroxy: 19.96 ng/mL — ABNORMAL LOW (ref 30–100)

## 2019-11-26 LAB — PHOSPHORUS: Phosphorus: 2 mg/dL — ABNORMAL LOW (ref 2.5–4.6)

## 2019-11-26 MED ORDER — HYDRALAZINE HCL 20 MG/ML IJ SOLN
10.0000 mg | Freq: Four times a day (QID) | INTRAMUSCULAR | Status: DC | PRN
  Administered 2019-11-27: 10 mg via INTRAVENOUS
  Filled 2019-11-26: qty 1

## 2019-11-26 MED ORDER — POTASSIUM PHOSPHATES 15 MMOLE/5ML IV SOLN
30.0000 mmol | Freq: Once | INTRAVENOUS | Status: AC
  Administered 2019-11-26: 22:00:00 30 mmol via INTRAVENOUS
  Filled 2019-11-26: qty 10

## 2019-11-26 NOTE — Progress Notes (Addendum)
SLP Cancellation Note  Patient Details Name: Brianna Woods MRN: [id number] DOB: 31-Aug-1935   Cancelled treatment:       Reason Eval/Treat Not Completed: Patient not medically ready;Medical issues which prohibited therapy;Fatigue/lethargy limiting ability to participate(chart reviewed; met w/ pt) Upon entering the room, the pt lying in the bed w/ her arms raised in the air. She was grunting/moaning constantly and did not stop when given verbal/tactile stim. She did not respond to give her first name, nor open her eyes when asked. She does Not aware of her situation, environment. Muttered speech continued w/ loud phonations noted x2; no verbal responses to basic questions.   Due to her Poor Cognitive awareness, pt is at High risk for aspiration/choking w/ any oral intake despite precautions. She is Not safe for oral intake at this time. Recommend continue current NPO status w/ frequent oral care as able to promote hygiene and stimulation of swallowing. ST services will be available for any further needs when pt is appropriate for participation in oral intake. Please re-consult. Precautions posted. MD and NSG updated.     Orinda Kenner, MS, CCC-SLP Dexter Signor 11/26/2019, 3:21 PM

## 2019-11-26 NOTE — Progress Notes (Signed)
PROGRESS NOTE    Brianna Woods  CZY:[id number] DOB: 06-Apr-1935 DOA: 11/22/2019 PCP: Ria Bush, MD   Brief Narrative:  Brianna Woods  is a 85 y.o. African-American female with a known history of multiple medical problems that are mentioned below, including asthma, coronary artery disease, fibromyalgia, hypertension, dyslipidemia, who presented to the emergency room with acute onset of altered mental status and abnormal labs at [facility].  She is ambulatory at baseline over the last few days she not been able to ambulate.  She is been having significantly diminished appetite and decreased p.o. intake.  She had labs drawn revealed hypernatremia that was up to 172 and hypokalemia as well as hyperchloremia.  She was also hypoglycemic with a blood glucose of 44 that was up to 62 here on arrival.  No reported nausea or vomiting or diarrhea or abdominal pain.  No reported chest pain dyspnea or cough or wheezing.  The patient was nonverbal during my interview and fairly somnolent and therefore no history could be obtained from her.  She's been admitted for acute metabolic encephalopathy, hypothermia, and hypoglycemia.  She's been treated with antibiotics empirically for presumed infection, though no definitive evidence yet.  Her labs have shown vitamin b12 and folate deficiency, which are now being replaced.  Palliative care has been consulted due to her recent decline.   Assessment & Plan:   Active Problems:   Goals of care, counseling/discussion   Altered mental status   Encephalopathy   Hypernatremia   Palliative care by specialist   Hypokalemia   Hypoglycemia   Adult failure to thrive  Goals of Care:  Patient seems to have been declining over the past several weeks.  Moved in with son b/c not doing well independently, found down a few times.  She lived with son, but declined with him as well, falling asleep in conversations, etc.  [facility]  Updated that she  wasn't eating or drinking, not acting herself, abnormal labs and transferred here. Discussed code status with son, [name] who noted that he thinks she would want to be DNR Will c/s palliative care as it seems she's failing to thrive - appreciate assistance  1.  SIRS  Hypothermia  Hypotension  AMS  R/o Sepsis  - intermittent hypothermia, persists - follow AM cortisol (18.1), TSH 6.814 with normal free T4 - s/p empiric IV abx cefepime x 5 days, d/c'd on 3/17 as all w/up negative so far  - UA does not appear c/w UTI, CXR without acute abnormality - follow blood and urine cultures NG - MRSA pcr negative  Acute metabolic encephalopathy - her encephalopathy is persistent, but slightly improved -> she's still significantly disoriented, unable to communicate clearly to me this morning, incomprehensible speech - TSH as above, with vitamin b12 deficiency and folate deficiency, will replace, VBG - high dose thiamine - delirium precautions - prolonged qt on EKG -> repeat EKG pending - ativan for agitation for now  # Hypothermia:  - follow cortisol, TSH 6.814 with normal free T4 - she's hypoglycemic, which is likely contributing - improved after bair hugger  2.  Hypoglycemia. - Continue to monitor on D10 w/K 10 IVF - Q4 POC BG checks  # Hypernatremia  Hyperchloremia: 2/2 dehydration, follow on hypotonic fluids (with hyperchloremia, ? Related to NS administration as well?) - was gradually improving, stalled, increase rate of fluids  # Hypophosphatemia: replace and follow  # Hypokalemia:  Replace and follow   4.  Hypothyroidism. -We will  continue Synthroid.  5.  Hypertension. -We will hold off Cozaar and resume Toprol-XL with adequate BP.  6.  GERD. -We will continue PPI therapy as well as H2 blocker.  7.  Peripheral neuropathy. -We will continue Neurontin.  8.  Dyslipidemia. -We will continue statin therapy.  # Anemia  Thrombocytopenia: thrombocytopenia is new,  could be related to sepsis with hypothermia, continue to monitor.   B12 and folate def likely contributing Follow iron panel, ferritin   Overall poor prognosis, palliative care is following  DVT prophylaxis: SCD Code Status: DNR Family Communication: son no answer 3/15, palliative care will continue to discuss with the family Disposition Plan:  . Patient came from: SNF            . Anticipated d/c place:SNF with hospice services if family agrees . Barriers to d/c OR conditions which need to be met to effect a safe d/c: pending improvement in electrolyte abnormalities and AMS, palliative care c/s   Consultants:   Palliative care  Procedures:   none  Antimicrobials:  Anti-infectives (From admission, onward)   Start     Dose/Rate Route Frequency Ordered Stop   11/24/19 0630  vancomycin (VANCOREADY) IVPB 750 mg/150 mL  Status:  Discontinued     750 mg 150 mL/hr over 60 Minutes Intravenous Every 24 hours 11/24/19 0644 11/24/19 1118   11/23/19 2200  vancomycin (VANCOREADY) IVPB 750 mg/150 mL  Status:  Discontinued     750 mg 150 mL/hr over 60 Minutes Intravenous Every 24 hours 11/23/19 0838 11/24/19 0644   11/23/19 0600  metroNIDAZOLE (FLAGYL) IVPB 500 mg  Status:  Discontinued     500 mg 100 mL/hr over 60 Minutes Intravenous Every 8 hours 11/23/19 0439 11/23/19 1258   11/23/19 0515  ceFEPIme (MAXIPIME) 2 g in sodium chloride 0.9 % 100 mL IVPB  Status:  Discontinued     2 g 200 mL/hr over 30 Minutes Intravenous Every 12 hours 11/23/19 0439 11/26/19 1203   11/23/19 0445  vancomycin (VANCOCIN) IVPB 1000 mg/200 mL premix  Status:  Discontinued     1,000 mg 200 mL/hr over 60 Minutes Intravenous  Once 11/23/19 0439 11/23/19 0507   11/22/19 2330  vancomycin (VANCOCIN) IVPB 1000 mg/200 mL premix     1,000 mg 200 mL/hr over 60 Minutes Intravenous  Once 11/22/19 2324 11/23/19 0315   11/22/19 2330  vancomycin (VANCOREADY) IVPB 500 mg/100 mL     500 mg 100 mL/hr over 60 Minutes  Intravenous  Once 11/22/19 2328 11/23/19 0513   11/22/19 2230  meropenem (MERREM) 1 g in sodium chloride 0.9 % 100 mL IVPB     1 g 200 mL/hr over 30 Minutes Intravenous  Once 11/22/19 2219 11/22/19 2313     Subjective: Moaning, incomprehensible speech  Objective: Vitals:   11/25/19 1014 11/25/19 1518 11/25/19 2312 11/26/19 0736  BP: (!) 150/73 (!) 175/66 (!) 169/97 (!) 156/90  Pulse:   96 88  Resp: _0 Temp: 98.3 F (36.8 C)  98.2 F (36.8 C) 98.2 F (36.8 C)  TempSrc: Rectal  Rectal   SpO2: 100% 96% 100% 100%  Weight:      Height:        Intake/Output Summary (Last 24 hours) at 11/26/2019 1348 Last data filed at 11/25/2019 1832 Gross per 24 hour  Intake 1371.64 ml  Output --  Net 1371.64 ml   Filed Weights   11/22/19 2157 11/22/19 2251  Weight: 62.6 kg 62.6 kg  Examination:  General: agitated, lethargic  Cardiovascular: Heart sounds show a regular rate, and rhythm.  Lungs: Clear to auscultation bilaterally  Abdomen: Soft, nontender, nondistended  Neurological: disoriented, restless. Moves all extremities 4 . Cranial nerves II through XII grossly intact. Skin: Warm and dry. No rashes or lesions. Extremities: No clubbing or cyanosis. No edema.    Data Reviewed: I have personally reviewed following labs and imaging studies  CBC: Recent Labs  Lab 11/22/19 2220 11/23/19 0614 11/24/19 0612 11/25/19 0710 11/26/19 0404  WBC 5.2 6.5 7.8 8.1 8.7  NEUTROABS 4.0  --  5.0 4.9 5.2  HGB 9.7* 8.9* 9.4* 9.8* 9.8*  HCT 32.9* 29.4* 31.3* 30.7* 31.2*  MCV 104.8* 102.8* 102.0* 98.4 96.9  PLT 75* 65* 59* 63* 70*   Basic Metabolic Panel: Recent Labs  Lab 11/22/19 2220 11/23/19 0041 11/23/19 1416 11/24/19 0612 11/24/19 2031 11/25/19 0710 11/26/19 0404  NA 157*   < > 155* 152* 153* 154* 152*  K 2.4*   < > 3.4* 3.4* 3.5 3.7 3.4*  CL 120*   < > 123* 122* 124* 121* 119*  CO2 27   < > _0 GLUCOSE 154*   < > 58* 97 110* 86 101*  BUN 35*   < >  26* _1 CREATININE 1.15*   < > 0.98 0.85 0.82 0.78 0.71  CALCIUM 8.6*   < > 8.4* 8.8* 8.6* 8.8* 8.7*  MG 2.2  --   --  1.7  --  2.2 1.8  PHOS  --   --   --  1.7*  --  2.2* 2.0*   < > = values in this interval not displayed.   GFR: Estimated Creatinine Clearance: 47.2 mL/min (by C-G formula based on SCr of 0.71 mg/dL). Liver Function Tests: Recent Labs  Lab 11/22/19 2220 11/24/19 0612 11/25/19 0710 11/26/19 0404  AST _2 ALT _3 ALKPHOS 69 60 64 63  BILITOT 1.2 0.8 0.9 0.9  PROT 5.4* 5.3* 5.2* 5.2*  ALBUMIN 3.1* 2.8* 2.7* 2.8*   No results for input(s): LIPASE, AMYLASE in the last 168 hours. No results for input(s): AMMONIA in the last 168 hours. Coagulation Profile: Recent Labs  Lab 11/22/19 2220 11/23/19 0614  INR 1.1 1.1   Cardiac Enzymes: No results for input(s): CKTOTAL, CKMB, CKMBINDEX, TROPONINI in the last 168 hours. BNP (last 3 results) No results for input(s): PROBNP in the last 8760 hours. HbA1C: No results for input(s): HGBA1C in the last 72 hours. CBG: Recent Labs  Lab 11/25/19 2048 11/26/19 0040 11/26/19 0423 11/26/19 0739 11/26/19 1138  GLUCAP 96 128* 115* 89 102*   Lipid Profile: No results for input(s): CHOL, HDL, LDLCALC, TRIG, CHOLHDL, LDLDIRECT in the last 72 hours. Thyroid Function Tests: No results for input(s): TSH, T4TOTAL, FREET4, T3FREE, THYROIDAB in the last 72 hours. Anemia Panel: Recent Labs    11/25/19 0710  VITAMINB12 161*  FOLATE 2.5*  FERRITIN 230  TIBC 192*  IRON 117   Sepsis Labs: Recent Labs  Lab 11/22/19 2220 11/23/19 0614  PROCALCITON <0.10 <0.10  LATICACIDVEN 1.2  --     Recent Results (from the past 240 hour(s))  Urine culture     Status: None   Collection Time: 11/22/19 10:20 PM   Specimen: Urine, Catheterized  Result Value Ref Range Status   Specimen Description   Final    URINE, CATHETERIZED Performed at [facility], [address] [place], [place] [address]     Special Requests   Final    NONE Performed at [facility], [address], [place], [place] [address]    Culture   Final    NO GROWTH Performed at [facility], [place] [address], [place], [place] [address]    Report Status 11/24/2019 FINAL  Final  Blood culture (routine x 2)     Status: None (Preliminary result)   Collection Time: 11/22/19 10:20 PM   Specimen: BLOOD  Result Value Ref Range Status   Specimen Description BLOOD Blood Culture adequate volume  Final   Special Requests   Final    BOTTLES DRAWN AEROBIC AND ANAEROBIC LEFT ANTECUBITAL   Culture   Final    NO GROWTH 4 DAYS Performed at [facility], [address], [place], [place] [address]    Report Status PENDING  Incomplete  Blood culture (routine x 2)     Status: None (Preliminary result)   Collection Time: 11/22/19 10:20 PM   Specimen: BLOOD  Result Value Ref Range Status   Specimen Description BLOOD LEFT WRIST  Final   Special Requests   Final    BOTTLES DRAWN AEROBIC AND ANAEROBIC Blood Culture adequate volume   Culture   Final    NO GROWTH 4 DAYS Performed at [facility], [address] [place], [place] [address]    Report Status PENDING  Incomplete  Respiratory Panel by RT PCR (Flu A&B, Covid) - Nasopharyngeal Swab     Status: None   Collection Time: 11/23/19 12:41 AM   Specimen: Nasopharyngeal Swab  Result Value Ref Range Status   SARS Coronavirus 2 by RT PCR NEGATIVE NEGATIVE Final    Comment: (NOTE) SARS-CoV-2 target nucleic acids are NOT DETECTED. The SARS-CoV-2 RNA is generally detectable in upper respiratoy specimens during the acute phase of infection. The lowest concentration of SARS-CoV-2 viral copies this assay can detect is 131 copies/mL. A negative result does not preclude SARS-Cov-2 infection and should not be used as the sole basis for treatment or other patient management decisions. A negative result may occur with  improper specimen  collection/handling, submission of specimen other than nasopharyngeal swab, presence of viral mutation(s) within the areas targeted by this assay, and inadequate number of viral copies (<131 copies/mL). A negative result must be combined with clinical observations, patient history, and epidemiological information. The expected result is Negative. Fact Sheet for Patients:  PinkCheek.be Fact Sheet for Healthcare Providers:  GravelBags.it This test is not yet ap proved or cleared by the [place] FDA and  has been authorized for detection and/or diagnosis of SARS-CoV-2 by FDA under an Emergency Use Authorization (EUA). This EUA will remain  in effect (meaning this test can be used) for the duration of the COVID-19 declaration under Section 564(b)(1) of the Act, 21 U.S.C. section 360bbb-3(b)(1), unless the authorization is terminated or revoked sooner.    Influenza A by PCR NEGATIVE NEGATIVE Final   Influenza B by PCR NEGATIVE NEGATIVE Final    Comment: (NOTE) The Xpert Xpress SARS-CoV-2/FLU/RSV assay is intended as an aid in  the diagnosis of influenza from Nasopharyngeal swab specimens and  should not be used as a sole basis for treatment. Nasal washings and  aspirates are unacceptable for Xpert Xpress SARS-CoV-2/FLU/RSV  testing. Fact Sheet for Patients: PinkCheek.be Fact Sheet for Healthcare Providers: GravelBags.it This test is not yet approved or cleared by the [place] and  has been authorized  for detection and/or diagnosis of SARS-CoV-2 by  FDA under an Emergency Use Authorization (EUA). This EUA will remain  in effect (meaning this test can be used) for the duration of the  Covid-19 declaration under Section 564(b)(1) of the Act, 21  U.S.C. section 360bbb-3(b)(1), unless the authorization is  terminated or revoked. Performed at [facility],  [address] [place], [place] [address]   MRSA PCR Screening     Status: None   Collection Time: 11/25/19  5:36 AM   Specimen: Nasopharyngeal  Result Value Ref Range Status   MRSA by PCR NEGATIVE NEGATIVE Final    Comment:        The GeneXpert MRSA Assay (FDA approved for NASAL specimens only), is one component of a comprehensive MRSA colonization surveillance program. It is not intended to diagnose MRSA infection nor to guide or monitor treatment for MRSA infections. Performed at [facility], [address], [place], [place] [address]          Radiology Studies: No results found.      Scheduled Meds: . vitamin C  500 mg Oral Daily  . atorvastatin  40 mg Oral Q M,W,F  . Chlorhexidine Gluconate Cloth  6 each Topical Daily  . cyanocobalamin  1,000 mcg Intramuscular Daily  . divalproex  250 mg Oral BID  . famotidine  40 mg Oral QHS  . gabapentin  300 mg Oral QHS  . levothyroxine  12.5 mcg Intravenous q AM  . metoprolol succinate  25 mg Oral BID  . traZODone  50 mg Oral QHS   Continuous Infusions: . dextrose 10 % 1,000 mL with potassium chloride 20 mEq infusion 150 mL/hr at 11/26/19 0401  . folic acid (FOLVITE) IVPB 1 mg (11/25/19 1643)  . potassium PHOSPHATE IVPB (in mmol)    . thiamine injection 500 mg (11/25/19 2212)   Followed by  . [START ON 11/28/2019] thiamine injection       LOS: 3 days    Time spent: over 6 min    Val Riles, MD [facility]   To contact the attending provider between 7A-7P or the covering provider during after hours 7P-7A, please log into the web site [email or web address] and access using universal [place] password for that web site. If you do not have the password, please call the hospital operator.  11/26/2019, 1:48 PM

## 2019-11-26 NOTE — Progress Notes (Signed)
PT Cancellation Note  Patient Details Name: Brianna Woods MRN: [id number] DOB: Jan 15, 1935   Cancelled Treatment:    Reason Eval/Treat Not Completed: Patient's level of consciousness.  PT consult received.  Chart reviewed.  Pt sleeping in bed upon PT arrival.  Attempted to wake pt via vc's and tactile cues but pt did not open her eyes (pt moving around in bed a little in general but did not follow any cues and resisted any attempt at gentle PROM with UE's/LE's).  Will re-attempt PT evaluation at a later date/time.  Leitha Bleak, PT 11/26/19, 11:45 AM

## 2019-11-26 NOTE — Care Management Important Message (Signed)
Important Message  Patient Details  Name: Brianna Woods MRN: [id number] Date of Birth: [birth year]   Medicare Important Message Given:  Yes     Shelbie Ammons, RN 11/26/2019, 10:34 AM

## 2019-11-26 NOTE — Progress Notes (Signed)
Daily Progress Note   Patient Name: Brianna Woods       Date: 11/26/2019 DOB: [birth year]  Age: 85 y.o. MRN#: [id number] Attending Physician: Val Riles, MD Primary Care Physician: Ria Bush, MD Admit Date: 11/22/2019  Reason for Consultation/Follow-up: Establishing goals of care  Subjective/GOC: Patient is awake but disoriented. Nonverbal. Does not follow commands. Does not appear to be in pain or discomfort. Not working with PT or SLP. Unable to take pills.   No family at bedside. Spoke with sone Brianna Woods via telephone to provide update including diagnoses, interventions, plan of care. Brianna Woods shares that he is "[id number]" certain he will chose hospice services but is not ready to make that decision today. He tells me he plans to make that decision by Thursday or Friday. Brianna Woods wishes to continue current plan of care and plans to visit his mother again this evening.    Length of Stay: 3  Current Medications: Scheduled Meds:  . vitamin C  500 mg Oral Daily  . atorvastatin  40 mg Oral Q M,W,F  . Chlorhexidine Gluconate Cloth  6 each Topical Daily  . cyanocobalamin  1,000 mcg Intramuscular Daily  . divalproex  250 mg Oral BID  . famotidine  40 mg Oral QHS  . gabapentin  300 mg Oral QHS  . levothyroxine  12.5 mcg Intravenous q AM  . metoprolol succinate  25 mg Oral BID  . traZODone  50 mg Oral QHS    Continuous Infusions: . dextrose 10 % 1,000 mL with potassium chloride 20 mEq infusion 150 mL/hr at 11/26/19 0401  . folic acid (FOLVITE) IVPB 1 mg (11/25/19 1643)  . potassium PHOSPHATE IVPB (in mmol)    . thiamine injection 500 mg (11/25/19 2212)   Followed by  . [START ON 11/28/2019] thiamine injection      PRN Meds: acetaminophen **OR** acetaminophen, ketorolac, LORazepam,  LORazepam, magnesium hydroxide, morphine injection, ondansetron **OR** ondansetron (ZOFRAN) IV, sodium chloride  Physical Exam Vitals and nursing note reviewed.  Constitutional:      Appearance: She is ill-appearing.  HENT:     Head: Normocephalic and atraumatic.  Pulmonary:     Effort: No tachypnea, accessory muscle usage or respiratory distress.  Skin:    General: Skin is warm and dry.  Neurological:     Mental Status: She is easily  aroused.     Comments: Periods of agitation, disoriented, does not follow commands  Psychiatric:        Attention and Perception: She is inattentive.        Cognition and Memory: Cognition is impaired.            Vital Signs: BP (!) 156/90 (BP Location: Right Leg)   Pulse 88   Temp 98.2 F (36.8 C)   Resp 18   Ht 5\' 2"  (1.575 m)   Wt 62.6 kg   SpO2 100%   BMI 25.24 kg/m  SpO2: SpO2: 100 % O2 Device: O2 Device: Room Air O2 Flow Rate:    Intake/output summary:   Intake/Output Summary (Last 24 hours) at 11/26/2019 1412 Last data filed at 11/25/2019 1832 Gross per 24 hour  Intake 1371.64 ml  Output --  Net 1371.64 ml   LBM: Last BM Date: (unknown) Baseline Weight: Weight: 62.6 kg Most recent weight: Weight: 62.6 kg       Palliative Assessment/Data: PPS 20%    Flowsheet Rows     Most Recent Value  Intake Tab  Referral Department  Hospitalist  Unit at Time of Referral  Med/Surg Unit  Palliative Care Primary Diagnosis  Neurology  Palliative Care Type  New Palliative care  Reason for referral  Clarify Goals of Care  Date first seen by Palliative Care  11/25/19  Clinical Assessment  Palliative Performance Scale Score  20%  Psychosocial & Spiritual Assessment  Palliative Care Outcomes  Patient/Family meeting held?  Yes  Who was at the meeting?  son via telephone  Palliative Care Outcomes  Clarified goals of care, Counseled regarding hospice, Provided end of life care assistance, Provided psychosocial or spiritual support, ACP  counseling assistance      Patient Active Problem List   Diagnosis Date Noted  . Palliative care by specialist   . Hypokalemia   . Hypoglycemia   . Adult failure to thrive   . Hypernatremia   . Altered mental status 11/23/2019  . Encephalopathy   . Somnolence 10/13/2019  . Low back pain 02/11/2019  . Acquired hypothyroidism 02/11/2019  . Injury of face 11/21/2018  . MDD (major depressive disorder), single episode, moderate ([place]) 07/09/2018  . Glaucoma   . Osteopenia 06/09/2018  . Hiatal hernia 06/09/2018  . Diverticulosis 06/09/2018  . Weight loss 05/23/2018  . Dysphagia 05/23/2018  . Insomnia 05/23/2018  . Uterine mass 05/23/2018  . Tricompartment osteoarthritis of right knee 02/13/2018  . Skin rash 04/21/2016  . Increased endometrial stripe thickness 10/21/2015  . Nephrolithiasis   . Pedal edema 09/25/2015  . Generalized abdominal pain 07/26/2015  . Kidney cyst, acquired 07/13/2015  . CAD (coronary artery disease) 07/13/2015  . Atherosclerosis of abdominal aorta ([place]) 07/13/2015  . Fatty pancreas 07/13/2015  . Goals of care, counseling/discussion 03/22/2015  . Intertrigo 03/22/2015  . Advanced care planning/counseling discussion 09/21/2014  . Medicare annual wellness visit, subsequent 03/20/2014  . Urinary incontinence 03/09/2014  . Chest discomfort 08/15/2013  . Anxiety attack 05/18/2012  . History of right bundle branch block   . Vitamin D deficiency 02/10/2012  . Dizziness 01/22/2012  . Fibromyalgia   . History of CVA (cerebrovascular accident)   . Asthma   . Nodular goiter, toxic or with hyperthyroidism   . GERD (gastroesophageal reflux disease)   . Mixed incontinence   . HTN (hypertension)   . HLD (hyperlipidemia)     Palliative Care Assessment & Plan   Patient Profile:  85 y.o.  female  with past medical history of asthma, CAD, fibromyalgia, HTN, dyslipidemia admitted on 11/22/2019 with altered mental status and abnormal labs from [facility].  Hospital admission for acute metabolic encephalopathy, hypothermia, hypoglycemia. Antibiotics started empirically, workup does not reveal infection. Labs revealed vitamin B12 and folate deficiency. CT head negative for acute findings, but age-related atrophy and chronic microvascular ischemic changes, old left inferior frontal infarct and encephalomalacia. Patient remains encephalopathic and unable to work with PT/SLP. Palliative medicine consultation for goals of care.   Assessment: SIRS Hypothermia Hypotension Acute metabolic encephalopathy Vitamin B12 deficiency Folate deficiency Hypernatremia Hypercholoremia Anemia Thrombocytopenia  Recommendations/Plan: DNR/DNI. NO feeding tube placement. Continue current plan of care and medical management.  Son considering initiation of hospice services at SNF. Son wishes for ongoing medical management and plans to make his decision about hospice in the next 24-48 hours.   Code Status: DNR/DNI   Code Status Orders  (From admission, onward)         Start     Ordered   11/23/19 0941  Do not attempt resuscitation (DNR)  Continuous    Question Answer Comment  In the event of cardiac or respiratory ARREST Do not call a "code blue"   In the event of cardiac or respiratory ARREST Do not perform Intubation, CPR, defibrillation or ACLS   In the event of cardiac or respiratory ARREST Use medication by any route, position, wound care, and other measures to relive pain and suffering. May use oxygen, suction and manual treatment of airway obstruction as needed for comfort.      11/23/19 0941        Code Status History    Date Active Date Inactive Code Status Order ID Comments User Context   11/23/2019 0439 11/23/2019 0941 Full Code [id number]  Mansy, Arvella Merles, MD ED   Advance Care Planning Activity      Prognosis:  Poor prognosis with declining functional/cognitive/nutritional status, adult failure to thrive likely from dementia with atrophy and  microvascular changes on CT head.  Discharge Planning: To Be Determined  Care plan was discussed with RN, RN CM, son, updated Dr. Dwyane Dee via epic chat  Thank you for allowing the Palliative Medicine Team to assist in the care of this patient.   Time In: 1350 Time Out: 1415 Total Time 25 Prolonged Time Billed no      Greater than 50%  of this time was spent counseling and coordinating care related to the above assessment and plan.  Ihor Dow, DNP, FNP-C Palliative Medicine Team  Phone: [phone] Fax: [phone]  Please contact Palliative Medicine Team phone at [phone] for questions and concerns.

## 2019-11-26 NOTE — Progress Notes (Signed)
Pharmacy Antibiotic Note  Brianna Woods is a 85 y.o. female admitted on 11/22/2019 with AMS w/ Tmin of 91.9, hypotensive w/ systolics of 70 - [id number], plts of 75 (baseline 200 - 300) 1/4 SIRS, but 2/3 qSOFA.  Pharmacy has been consulted for vanc/cefepime dosing. Patient received vanc load of 1.5g IV and meropenem 1g IV x 1, but then switched to cefepime 2g IV.  Vancomycin discontinued 3/15  Patient is currently on Cefepime day 5. PCT <0.10 x 2, WBC: wnl, patient is afebrile, Cx remain negative, and no source of infection.  Plan: Day 5 IV abx. Will continue cefepime 2g IV q12h   Recommend DC antibiotics since PCT <0.10 x 2, WBC: wnl, Cx remain negative, and no source of infection.  Height: 5\' 2"  (157.5 cm) Weight: 138 lb 0.1 oz (62.6 kg) IBW/kg (Calculated) : 50.1  Temp (24hrs), Avg:98.2 F (36.8 C), Min:98.2 F (36.8 C), Max:98.2 F (36.8 C)  Recent Labs  Lab 11/22/19 2220 11/23/19 0041 11/23/19 0614 11/23/19 0614 11/23/19 1416 11/24/19 0612 11/24/19 2031 11/25/19 0710 11/26/19 0404  WBC 5.2  --  6.5  --   --  7.8  --  8.1 8.7  CREATININE 1.15*   < > 1.00   < > 0.98 0.85 0.82 0.78 0.71  LATICACIDVEN 1.2  --   --   --   --   --   --   --   --    < > = values in this interval not displayed.    Estimated Creatinine Clearance: 47.2 mL/min (by C-G formula based on SCr of 0.71 mg/dL).     Thank you for allowing pharmacy to be a part of this patient's care.  Vallery Sa, PharmD, BCPS Clinical Pharmacist 11/26/2019 11:57 AM

## 2019-11-27 LAB — GLUCOSE, CAPILLARY
Glucose-Capillary: 100 mg/dL — ABNORMAL HIGH (ref 70–99)
Glucose-Capillary: 117 mg/dL — ABNORMAL HIGH (ref 70–99)
Glucose-Capillary: 145 mg/dL — ABNORMAL HIGH (ref 70–99)
Glucose-Capillary: 148 mg/dL — ABNORMAL HIGH (ref 70–99)
Glucose-Capillary: 90 mg/dL (ref 70–99)
Glucose-Capillary: 95 mg/dL (ref 70–99)

## 2019-11-27 LAB — CULTURE, BLOOD (ROUTINE X 2)
Culture: NO GROWTH
Culture: NO GROWTH
Special Requests: ADEQUATE
Specimen Description: ADEQUATE

## 2019-11-27 LAB — CBC
HCT: 31.4 % — ABNORMAL LOW (ref 36.0–46.0)
Hemoglobin: 10.2 g/dL — ABNORMAL LOW (ref 12.0–15.0)
MCH: 31.2 pg (ref 26.0–34.0)
MCHC: 32.5 g/dL (ref 30.0–36.0)
MCV: 96 fL (ref 80.0–100.0)
Platelets: 98 10*3/uL — ABNORMAL LOW (ref 150–400)
RBC: 3.27 MIL/uL — ABNORMAL LOW (ref 3.87–5.11)
RDW: 16.7 % — ABNORMAL HIGH (ref 11.5–15.5)
WBC: 10 10*3/uL (ref 4.0–10.5)
nRBC: 0 % (ref 0.0–0.2)

## 2019-11-27 LAB — BASIC METABOLIC PANEL
Anion gap: 6 (ref 5–15)
BUN: 10 mg/dL (ref 8–23)
CO2: 27 mmol/L (ref 22–32)
Calcium: 8.7 mg/dL — ABNORMAL LOW (ref 8.9–10.3)
Chloride: 114 mmol/L — ABNORMAL HIGH (ref 98–111)
Creatinine, Ser: 0.84 mg/dL (ref 0.44–1.00)
GFR calc Af Amer: >60 mL/min (ref >60)
GFR calc non Af Amer: >60 mL/min (ref >60)
Glucose, Bld: 145 mg/dL — ABNORMAL HIGH (ref 70–99)
Potassium: 3.4 mmol/L — ABNORMAL LOW (ref 3.5–5.1)
Sodium: 147 mmol/L — ABNORMAL HIGH (ref 135–145)

## 2019-11-27 LAB — MAGNESIUM: Magnesium: 1.6 mg/dL — ABNORMAL LOW (ref 1.7–2.4)

## 2019-11-27 LAB — PHOSPHORUS: Phosphorus: 4.2 mg/dL (ref 2.5–4.6)

## 2019-11-27 MED ORDER — MAGNESIUM SULFATE 2 GM/50ML IV SOLN
2.0000 g | Freq: Once | INTRAVENOUS | Status: AC
  Administered 2019-11-27: 2 g via INTRAVENOUS
  Filled 2019-11-27: qty 50

## 2019-11-27 NOTE — TOC Progression Note (Signed)
Transition of Care [facility]) - Progression Note    Patient Details  Name: Brianna Woods MRN: [id number] Date of Birth: 1935-03-17  Transition of Care [facility]) CM/SW Contact  Shelbie Hutching, RN Phone Number: 11/27/2019, 3:36 PM  Clinical Narrative:    Patient remains NPO at this time, speech is following.  Plan will be for discharge back to [facility].  Palliative is following to see if family wants hospice at [facility].     Expected Discharge Plan: [place] Barriers to Discharge: Continued Medical Work up  Expected Discharge Plan and Services Expected Discharge Plan: [address] In-house Referral: Hospice / Palliative Care Discharge Planning Services: CM Consult   Living arrangements for the past 2 months: [place]                                       Social Determinants of Health (SDOH) Interventions    Readmission Risk Interventions No flowsheet data found.

## 2019-11-27 NOTE — Evaluation (Signed)
Clinical/Bedside Swallow Evaluation Patient Details  Name: Brianna Woods MRN: [id number] Date of Birth: December 28, 1934  Today's Date: 11/27/2019 Time: SLP Start Time (ACUTE ONLY): 1000 SLP Stop Time (ACUTE ONLY): 1058 SLP Time Calculation (min) (ACUTE ONLY): 58 min  Past Medical History:  Past Medical History:  Diagnosis Date  . Anemia   . Arthritis   . Asthma    per pt  . Atherosclerosis of abdominal aorta ([place]) 07/2015   by CT  . Barrett's esophagus    on EGD 2008, EGD WNL 2013  . CAD (coronary artery disease) 07/2015   of LAD by CT  . Diverticulosis 2014   sigmoid on colonoscopy  . Fatty pancreas 07/2015   by CT  . Fibromyalgia    per pt, no records of this   . GERD (gastroesophageal reflux disease)    and esoph stricture s/p dilation 2008 HH by CT 2016  . Glaucoma   . History of anxiety    was on prozac then effexor (pt denies h/o anxiety/depression)  . History of cardiac murmur   . History of chicken pox   . History of CVA (cerebrovascular accident) 2008   "I've had several TIAs"  . History of right bundle branch block 2011  . History of ulcer disease    per pt, no records of this  . HLD (hyperlipidemia)   . HTN (hypertension)   . Kidney cyst, acquired 07/2015   by CT, rec rpt renal MRI in 6 months  . Mixed incontinence    on ditropan  . Nephrolithiasis    s/p surgery R kidney (64mm) 11/2009  . Nodular goiter, toxic or with hyperthyroidism    h/o toxic, on methimazole, known large left thyroid nodule 08/2013 s/p beign biopsy per patient 2010, saw Dr. Gabriel Carina, Rad I ablation 04/2014  . Vitamin D deficiency    Past Surgical History:  Past Surgical History:  Procedure Laterality Date  . CATARACT EXTRACTION  1990   w/ implants  . CHOLECYSTECTOMY  2006  . COLONOSCOPY  10/02/12   diverticulosis, o/w WNL ([place])  . dexa     no records received  . ESOPHAGOGASTRODUODENOSCOPY  11/2011   LA grade A esophagitis lower 1/3, dilated, med HH, o/w WNL - path: + GERD, no barrett's -  f/u 11/2016  . LITHOTRIPSY Right 2011  . thyroid uptake scan  04/2014   unifrm uptake, enlarged thyroid consistent with grave's dz   HPI:  Brianna Woods  is a 85 y.o. African-American female with a known history of multiple medical problems that are mentioned below, including asthma, coronary artery disease, fibromyalgia, hypertension, dyslipidemia, who presented to the emergency room with acute onset of altered mental status and abnormal labs at [facility].  She is ambulatory at baseline over the last few days she not been able to ambulate.  She is been having significantly diminished appetite and decreased p.o. intake.  She had labs drawn revealed hypernatremia that was up to 172 and hypokalemia as well as hyperchloremia.  She was also hypoglycemic with a blood glucose of 44 that was up to 62 here on arrival.  No reported nausea or vomiting or diarrhea or abdominal pain.  No reported chest pain dyspnea or cough or wheezing.  The patient was nonverbal during my interview and fairly somnolent and therefore no history could be obtained from her.  She's been admitted for acute metabolic encephalopathy, hypothermia, and hypoglycemia.  She's been treated with antibiotics empirically for presumed infection, though no  definitive evidence yet.  Her labs have shown vitamin b12 and folate deficiency, which are now being replaced.  Palliative care has been consulted due to her recent decline.  Today, the patient is more alert/oriented to others and her surroundings.   Assessment / Plan / Recommendation Clinical Impression  The patient is significantly more oriented to her surroundings and swallowing.  She opened her mouth for the spoon and increased mouth opening with visual/verbal cues.  It is difficult to see/palpate laryngeal elevation, but the oral cavity was clear between trials.  She did not have clear or visual response to ice chip or small amount of water pipetted into the oral  cavity.  The patient told me her name with good intelligibility and with clear vocal quality.  She fell asleep after 4-5 trials.  Recommend she remain NPO for now, but she can have ice chips and bites/sips with meds. Recommend continue w/ frequent oral care as able to promote hygiene and stimulation of swallowing. ST services will follow for readiness for an oral diet.  Prognosis for return to an oral diet is good given her improvement in mental status, no overt clinical indicators of aspiration, and clear chest X-ray.  SLP Visit Diagnosis: Dysphagia, oropharyngeal phase (R13.12)    Aspiration Risk  Mild aspiration risk    Diet Recommendation NPO except meds;Ice chips PRN after oral care        Other  Recommendations Oral Care Recommendations: Oral care BID;Staff/trained caregiver to provide oral care   Follow up Recommendations Other (comment)(TBD)      Frequency and Duration min 3x week  2 weeks       Prognosis Prognosis for Safe Diet Advancement: Good Barriers to Reach Goals: Cognitive deficits      Swallow Study   General Date of Onset: 11/23/19 HPI: Brianna Woods  is a 85 y.o. African-American female with a known history of multiple medical problems that are mentioned below, including asthma, coronary artery disease, fibromyalgia, hypertension, dyslipidemia, who presented to the emergency room with acute onset of altered mental status and abnormal labs at [facility].  She is ambulatory at baseline over the last few days she not been able to ambulate.  She is been having significantly diminished appetite and decreased p.o. intake.  She had labs drawn revealed hypernatremia that was up to 172 and hypokalemia as well as hyperchloremia.  She was also hypoglycemic with a blood glucose of 44 that was up to 62 here on arrival.  No reported nausea or vomiting or diarrhea or abdominal pain.  No reported chest pain dyspnea or cough or wheezing.  The patient was  nonverbal during my interview and fairly somnolent and therefore no history could be obtained from her.  She's been admitted for acute metabolic encephalopathy, hypothermia, and hypoglycemia.  She's been treated with antibiotics empirically for presumed infection, though no definitive evidence yet.  Her labs have shown vitamin b12 and folate deficiency, which are now being replaced.  Palliative care has been consulted due to her recent decline.  Today, the patient is more alert/oriented to others and her surroundings. Type of Study: Bedside Swallow Evaluation Previous Swallow Assessment: None     - unable to participate Diet Prior to this Study: NPO Behavior/Cognition: Impulsive;Distractible;Requires cueing Oral Cavity Assessment: Within Functional Limits Patient Positioning: Upright in bed;Other (comment)(Moves about ) Baseline Vocal Quality: Not observed Volitional Cough: Cognitively unable to elicit Volitional Swallow: Unable to elicit    Oral/Motor/Sensory Function Overall Oral Motor/Sensory  Function: Within functional limits   Ice Chips Ice chips: Impaired Presentation: Spoon Other Comments: No appreciable response- no oral manipulation but no cough   Thin Liquid Thin Liquid: Impaired Presentation: (Pipetted with straw) Other Comments: No appreciable response- no oral manipulation but no cough    Nectar Thick     Honey Thick     Puree Puree: Impaired Presentation: Spoon Other Comments: Not able to maintain arousal   Solid           Leroy Sea, MS/CCC- SLP  Valetta Fuller, [place]aine [place] 11/27/2019,11:49 AM

## 2019-11-27 NOTE — Evaluation (Signed)
Physical Therapy Evaluation Patient Details Name: Brianna Woods MRN: [id number] DOB: February 15, 1935 Today's Date: 11/27/2019   History of Present Illness  Pt is an 85 y.o. female presenting to hospital 11/22/19 with AMS and abnormal labs; also decreased PO intake.  Pt admitted with SIRS manifested with hypothermia, hypotension, acute encephalopathy, hyperglycemia, abnormal electrolytes, hypothyroidism, and htn.  PMH includes Barrett's esophagus, fibromyalgia, h/o anxiety, h/o CVA, dementia, GERD, hypothyroidism, htn, anemia.  Clinical Impression  Prior to hospital admission, pt was ambulatory; recently at [facility].  Pt resting in bed with eyes open upon PT arrival to pt's room.  Pt intermittently talking but difficult to understand pt at times.  Currently pt is max assist semi-supine to sitting edge of bed.  Initially pt was max assist for sitting balance but improved to close SBA to min to mod assist; pt intermittently leaning to L or forward requiring assist for upright.  Pt tending to posture with L UE stiff and extended (and occasionally R UE as well) but with cueing pt able to relax B UE's to flex B elbows and wrists (but pt would typically posture with L UE in extended position again).  Pt would benefit from skilled PT to address noted impairments and functional limitations (see below for any additional details).  Upon hospital discharge, pt would benefit from STR.    Follow Up Recommendations SNF    Equipment Recommendations  Rolling walker with 5" wheels;3in1 (PT);Wheelchair (measurements PT);Wheelchair cushion (measurements PT)    Recommendations for Other Services       Precautions / Restrictions Precautions Precautions: Fall Precaution Comments: R UE PICC; NPO; aspiration Restrictions Weight Bearing Restrictions: No      Mobility  Bed Mobility Overal bed mobility: Needs Assistance Bed Mobility: Supine to Sit;Sit to Supine     Supine to sit: Max assist;HOB elevated Sit to  supine: +2 for physical assistance;HOB elevated   General bed mobility comments: max assist x1 semi-supine to sitting edge of bed; 2 assist sit to semi-supine  Transfers                 General transfer comment: Deferred d/t safety concerns  Ambulation/Gait                Stairs            Wheelchair Mobility    Modified Rankin (Stroke Patients Only)       Balance Overall balance assessment: Needs assistance Sitting-balance support: Single extremity supported;Feet supported Sitting balance-Leahy Scale: Poor Sitting balance - Comments: Initially max assist for sitting balance but improved to close SBA to min to mod assist; pt intermittently leaning to L or forward requiring assist for upright Postural control: Left lateral lean                                   Pertinent Vitals/Pain  Painful to touch L foot.    Home Living Family/patient expects to be discharged to:: Other (Comment)                 Additional Comments: Per chart pt was living with son but most recently pt was at [facility] since early February    Prior Function           Comments: Per chart pt is normally able to ambulate.     Hand Dominance        Extremity/Trunk Assessment   Upper Extremity  Assessment Upper Extremity Assessment: Difficult to assess due to impaired cognition    Lower Extremity Assessment Lower Extremity Assessment: Difficult to assess due to impaired cognition    Cervical / Trunk Assessment Cervical / Trunk Assessment: Kyphotic  Communication   Communication: (Pt intermittently verbalizing (difficult to understand pt at times))  Cognition Arousal/Alertness: Awake/alert Behavior During Therapy: Flat affect Overall Cognitive Status: No family/caregiver present to determine baseline cognitive functioning                                 General Comments: Pt did not know she was in the hospital; oriented to  self only      General Comments   Nursing cleared pt for participation in physical therapy.    Exercises  Pt able to sit edge of bed approximately 10 minutes with assist.   Assessment/Plan    PT Assessment Patient needs continued PT services  PT Problem List Decreased strength;Decreased range of motion;Decreased activity tolerance;Decreased balance;Decreased mobility;Decreased knowledge of use of DME;Decreased knowledge of precautions       PT Treatment Interventions DME instruction;Gait training;Functional mobility training;Therapeutic activities;Therapeutic exercise;Balance training;Patient/family education    PT Goals (Current goals can be found in the Care Plan section)  Acute Rehab PT Goals Patient Stated Goal: pt did not state any goals PT Goal Formulation: Patient unable to participate in goal setting Time For Goal Achievement: 12/11/19 Potential to Achieve Goals: Fair    Frequency Min 2X/week   Barriers to discharge Decreased caregiver support      Co-evaluation               AM-PAC PT "6 Clicks" Mobility  Outcome Measure Help needed turning from your back to your side while in a flat bed without using bedrails?: A Lot Help needed moving from lying on your back to sitting on the side of a flat bed without using bedrails?: A Lot Help needed moving to and from a bed to a chair (including a wheelchair)?: Total Help needed standing up from a chair using your arms (e.g., wheelchair or bedside chair)?: Total Help needed to walk in hospital room?: Total Help needed climbing 3-5 steps with a railing? : Total 6 Click Score: 8    End of Session   Activity Tolerance: Patient limited by fatigue Patient left: in bed(nursing present assisting pt with clean-up; nursing aware pt with B mitts off) Nurse Communication: Mobility status;Precautions PT Visit Diagnosis: Other abnormalities of gait and mobility (R26.89);Muscle weakness (generalized) (M62.81);Difficulty in  walking, not elsewhere classified (R26.2)    Time: [id number] PT Time Calculation (min) (ACUTE ONLY): 21 min   Charges:   PT Evaluation $PT Eval Low Complexity: 1 Low PT Treatments $Therapeutic Exercise: 8-22 mins        Leitha Bleak, PT 11/27/19, 4:20 PM

## 2019-11-27 NOTE — Progress Notes (Signed)
PROGRESS NOTE    Brianna Woods  CZY:[id number] DOB: 06-Apr-1935 DOA: 11/22/2019 PCP: Ria Bush, MD   Brief Narrative:  Brianna Woods  is a 85 y.o. African-American female with a known history of multiple medical problems that are mentioned below, including asthma, coronary artery disease, fibromyalgia, hypertension, dyslipidemia, who presented to the emergency room with acute onset of altered mental status and abnormal labs at [facility] facility.  She is ambulatory at baseline over the last few days she not been able to ambulate.  She is been having significantly diminished appetite and decreased p.o. intake.  She had labs drawn revealed hypernatremia that was up to 172 and hypokalemia as well as hyperchloremia.  She was also hypoglycemic with a blood glucose of 44 that was up to 62 here on arrival.  No reported nausea or vomiting or diarrhea or abdominal pain.  No reported chest pain dyspnea or cough or wheezing.  The patient was nonverbal during my interview and fairly somnolent and therefore no history could be obtained from her.  She's been admitted for acute metabolic encephalopathy, hypothermia, and hypoglycemia.  She's been treated with antibiotics empirically for presumed infection, though no definitive evidence yet.  Her labs have shown vitamin b12 and folate deficiency, which are now being replaced.  Palliative care has been consulted due to her recent decline.   Assessment & Plan:   Active Problems:   Goals of care, counseling/discussion   Altered mental status   Encephalopathy   Hypernatremia   Palliative care by specialist   Hypokalemia   Hypoglycemia   Adult failure to thrive  Goals of Care:  Patient seems to have been declining over the past several weeks.  Moved in with son b/c not doing well independently, found down a few times.  She lived with son, but declined with him as well, falling asleep in conversations, etc.  [facility]  Updated that she  wasn't eating or drinking, not acting herself, abnormal labs and transferred here. Discussed code status with son, [name] who noted that he thinks she would want to be DNR Will c/s palliative care as it seems she's failing to thrive - appreciate assistance  1.  SIRS  Hypothermia  Hypotension  AMS  R/o Sepsis  - intermittent hypothermia, persists - follow AM cortisol (18.1), TSH 6.814 with normal free T4 - s/p empiric IV abx cefepime x 5 days, d/c'd on 3/17 as all w/up negative so far  - UA does not appear c/w UTI, CXR without acute abnormality - follow blood and urine cultures NG - MRSA pcr negative  Acute metabolic encephalopathy - her encephalopathy is persistent, but slightly improved -> she's still significantly disoriented, unable to communicate clearly to me this morning, incomprehensible speech - TSH as above, with vitamin b12 deficiency and folate deficiency, will replace, VBG - high dose thiamine - delirium precautions - prolonged qt on EKG -> repeat EKG pending - ativan for agitation for now  # Hypothermia:  - follow cortisol, TSH 6.814 with normal free T4 - she's hypoglycemic, which is likely contributing - improved after bair hugger  2.  Hypoglycemia. - Continue to monitor on D10 w/K 10 IVF - Q4 POC BG checks  # Hypernatremia  Hyperchloremia: 2/2 dehydration, follow on hypotonic fluids (with hyperchloremia, ? Related to NS administration as well?) - was gradually improving, stalled, increase rate of fluids  # Hypophosphatemia: replace and follow  # Hypokalemia:  Replace and follow   4.  Hypothyroidism. -We will  continue Synthroid.  5.  Hypertension. -We will hold off Cozaar and resume Toprol-XL with adequate BP.  6.  GERD. -We will continue PPI therapy as well as H2 blocker.  7.  Peripheral neuropathy. -We will continue Neurontin.  8.  Dyslipidemia. -We will continue statin therapy.  # Anemia  Thrombocytopenia: thrombocytopenia is new,  could be related to sepsis with hypothermia, continue to monitor.   B12 and folate def likely contributing Follow iron panel, ferritin   Overall poor prognosis, palliative care is following  DVT prophylaxis: SCD Code Status: DNR Family Communication: son no answer 3/15, palliative care will continue to discuss with the family Disposition Plan:  . Patient came from: SNF            . Anticipated d/c place:SNF with hospice services if family agrees . Barriers to d/c OR conditions which need to be met to effect a safe d/c: pending improvement in electrolyte abnormalities and AMS, palliative care c/s . Still patient is n.p.o. due to risk of aspiration, speech and swallow following.   Consultants:   Palliative care  Procedures:   none  Antimicrobials:  Anti-infectives (From admission, onward)   Start     Dose/Rate Route Frequency Ordered Stop   11/24/19 0630  vancomycin (VANCOREADY) IVPB 750 mg/150 mL  Status:  Discontinued     750 mg 150 mL/hr over 60 Minutes Intravenous Every 24 hours 11/24/19 0644 11/24/19 1118   11/23/19 2200  vancomycin (VANCOREADY) IVPB 750 mg/150 mL  Status:  Discontinued     750 mg 150 mL/hr over 60 Minutes Intravenous Every 24 hours 11/23/19 0838 11/24/19 0644   11/23/19 0600  metroNIDAZOLE (FLAGYL) IVPB 500 mg  Status:  Discontinued     500 mg 100 mL/hr over 60 Minutes Intravenous Every 8 hours 11/23/19 0439 11/23/19 1258   11/23/19 0515  ceFEPIme (MAXIPIME) 2 g in sodium chloride 0.9 % 100 mL IVPB  Status:  Discontinued     2 g 200 mL/hr over 30 Minutes Intravenous Every 12 hours 11/23/19 0439 11/26/19 1203   11/23/19 0445  vancomycin (VANCOCIN) IVPB 1000 mg/200 mL premix  Status:  Discontinued     1,000 mg 200 mL/hr over 60 Minutes Intravenous  Once 11/23/19 0439 11/23/19 0507   11/22/19 2330  vancomycin (VANCOCIN) IVPB 1000 mg/200 mL premix     1,000 mg 200 mL/hr over 60 Minutes Intravenous  Once 11/22/19 2324 11/23/19 0315   11/22/19 2330   vancomycin (VANCOREADY) IVPB 500 mg/100 mL     500 mg 100 mL/hr over 60 Minutes Intravenous  Once 11/22/19 2328 11/23/19 0513   11/22/19 2230  meropenem (MERREM) 1 g in sodium chloride 0.9 % 100 mL IVPB     1 g 200 mL/hr over 30 Minutes Intravenous  Once 11/22/19 2219 11/22/19 2313     Subjective: No overnight issues, patient was seen and examined at bedside.  As per RN patient is little bit more alert but still AAO x0. Patient is able to answer some of the questions to the RN as she stated that it has been hurting and she has been replying with nodding her head. She open her eyes for me and noted head for being in discomfort.  Objective: Vitals:   11/26/19 0736 11/26/19 1614 11/27/19 0004 11/27/19 0825  BP: (!) 156/90 (!) 147/84 (!) 172/104 (!) 191/95  Pulse: 88 81 91 89  Resp: '18 18 18 18  '$ Temp: 98.2 F (36.8 C) 98.6 F (37 C) 98.1 F (36.7  C) 99 F (37.2 C)  TempSrc:  Axillary Axillary   SpO2: 100% 100% 100% 100%  Weight:      Height:        Intake/Output Summary (Last 24 hours) at 11/27/2019 1352 Last data filed at 11/27/2019 0500 Gross per 24 hour  Intake 3259.84 ml  Output --  Net 3259.84 ml   Filed Weights   11/22/19 2157 11/22/19 2251  Weight: 62.6 kg 62.6 kg    Examination:  General: Sleepy and  lethargic  Cardiovascular: Heart sounds show a regular rate, and rhythm.  Lungs: Clear to auscultation bilaterally  Abdomen: Soft, nontender, nondistended  Neurological: disoriented, restless. Moves all extremities 4 . Cranial nerves II through XII grossly intact. Skin: Warm and dry. No rashes or lesions. Extremities: No clubbing or cyanosis. No edema.    Data Reviewed: I have personally reviewed following labs and imaging studies  CBC: Recent Labs  Lab 11/22/19 2220 11/22/19 2220 11/23/19 0614 11/24/19 0612 11/25/19 0710 11/26/19 0404 11/27/19 0235  WBC 5.2   < > 6.5 7.8 8.1 8.7 10.0  NEUTROABS 4.0  --   --  5.0 4.9 5.2  --   HGB 9.7*   < > 8.9* 9.4*  9.8* 9.8* 10.2*  HCT 32.9*   < > 29.4* 31.3* 30.7* 31.2* 31.4*  MCV 104.8*   < > 102.8* 102.0* 98.4 96.9 96.0  PLT 75*   < > 65* 59* 63* 70* 98*   < > = values in this interval not displayed.   Basic Metabolic Panel: Recent Labs  Lab 11/22/19 2220 11/23/19 0041 11/24/19 0612 11/24/19 2031 11/25/19 0710 11/26/19 0404 11/27/19 0235  NA 157*   < > 152* 153* 154* 152* 147*  K 2.4*   < > 3.4* 3.5 3.7 3.4* 3.4*  CL 120*   < > 122* 124* 121* 119* 114*  CO2 27   < > '26 26 24 31 27  '$ GLUCOSE 154*   < > 97 110* 86 101* 145*  BUN 35*   < > '19 15 13 11 10  '$ CREATININE 1.15*   < > 0.85 0.82 0.78 0.71 0.84  CALCIUM 8.6*   < > 8.8* 8.6* 8.8* 8.7* 8.7*  MG 2.2  --  1.7  --  2.2 1.8 1.6*  PHOS  --   --  1.7*  --  2.2* 2.0* 4.2   < > = values in this interval not displayed.   GFR: Estimated Creatinine Clearance: 44.9 mL/min (by C-G formula based on SCr of 0.84 mg/dL). Liver Function Tests: Recent Labs  Lab 11/22/19 2220 11/24/19 0612 11/25/19 0710 11/26/19 0404  AST '25 28 25 25  '$ ALT '23 24 23 25  '$ ALKPHOS 69 60 64 63  BILITOT 1.2 0.8 0.9 0.9  PROT 5.4* 5.3* 5.2* 5.2*  ALBUMIN 3.1* 2.8* 2.7* 2.8*   No results for input(s): LIPASE, AMYLASE in the last 168 hours. No results for input(s): AMMONIA in the last 168 hours. Coagulation Profile: Recent Labs  Lab 11/22/19 2220 11/23/19 0614  INR 1.1 1.1   Cardiac Enzymes: No results for input(s): CKTOTAL, CKMB, CKMBINDEX, TROPONINI in the last 168 hours. BNP (last 3 results) No results for input(s): PROBNP in the last 8760 hours. HbA1C: No results for input(s): HGBA1C in the last 72 hours. CBG: Recent Labs  Lab 11/26/19 2018 11/26/19 2359 11/27/19 0424 11/27/19 0823 11/27/19 1143  GLUCAP 83 90 117* 95 100*   Lipid Profile: No results for input(s): CHOL, HDL, LDLCALC, TRIG, CHOLHDL,  LDLDIRECT in the last 72 hours. Thyroid Function Tests: No results for input(s): TSH, T4TOTAL, FREET4, T3FREE, THYROIDAB in the last 72  hours. Anemia Panel: Recent Labs    11/25/19 0710  VITAMINB12 161*  FOLATE 2.5*  FERRITIN 230  TIBC 192*  IRON 117   Sepsis Labs: Recent Labs  Lab 11/22/19 2220 11/23/19 0614  PROCALCITON <0.10 <0.10  LATICACIDVEN 1.2  --     Recent Results (from the past 240 hour(s))  Urine culture     Status: None   Collection Time: 11/22/19 10:20 PM   Specimen: Urine, Catheterized  Result Value Ref Range Status   Specimen Description   Final    URINE, CATHETERIZED Performed at [facility], [address], [place], [place] [address]    Special Requests   Final    NONE Performed at [facility], [address] [place], [place] [address]    Culture   Final    NO GROWTH Performed at [facility], [place] [address], [place], [place] [address]    Report Status 11/24/2019 FINAL  Final  Blood culture (routine x 2)     Status: None   Collection Time: 11/22/19 10:20 PM   Specimen: BLOOD  Result Value Ref Range Status   Specimen Description BLOOD Blood Culture adequate volume  Final   Special Requests   Final    BOTTLES DRAWN AEROBIC AND ANAEROBIC LEFT ANTECUBITAL   Culture   Final    NO GROWTH 5 DAYS Performed at [facility], [address], [place], [place] [address]    Report Status 11/27/2019 FINAL  Final  Blood culture (routine x 2)     Status: None   Collection Time: 11/22/19 10:20 PM   Specimen: BLOOD  Result Value Ref Range Status   Specimen Description BLOOD LEFT WRIST  Final   Special Requests   Final    BOTTLES DRAWN AEROBIC AND ANAEROBIC Blood Culture adequate volume   Culture   Final    NO GROWTH 5 DAYS Performed at [facility], [address] [place], [place] [address]    Report Status 11/27/2019 FINAL  Final  Respiratory Panel by RT PCR (Flu A&B, Covid) - Nasopharyngeal Swab     Status: None   Collection Time: 11/23/19 12:41 AM   Specimen: Nasopharyngeal Swab  Result Value Ref Range Status   SARS Coronavirus 2 by  RT PCR NEGATIVE NEGATIVE Final    Comment: (NOTE) SARS-CoV-2 target nucleic acids are NOT DETECTED. The SARS-CoV-2 RNA is generally detectable in upper respiratoy specimens during the acute phase of infection. The lowest concentration of SARS-CoV-2 viral copies this assay can detect is 131 copies/mL. A negative result does not preclude SARS-Cov-2 infection and should not be used as the sole basis for treatment or other patient management decisions. A negative result may occur with  improper specimen collection/handling, submission of specimen other than nasopharyngeal swab, presence of viral mutation(s) within the areas targeted by this assay, and inadequate number of viral copies (<131 copies/mL). A negative result must be combined with clinical observations, patient history, and epidemiological information. The expected result is Negative. Fact Sheet for Patients:  PinkCheek.be Fact Sheet for Healthcare Providers:  GravelBags.it This test is not yet ap proved or cleared by the [place] FDA and  has been authorized for detection and/or diagnosis of SARS-CoV-2 by FDA under an Emergency Use Authorization (EUA). This EUA will remain  in effect (meaning this test can be used) for  the duration of the COVID-19 declaration under Section 564(b)(1) of the Act, 21 U.S.C. section 360bbb-3(b)(1), unless the authorization is terminated or revoked sooner.    Influenza A by PCR NEGATIVE NEGATIVE Final   Influenza B by PCR NEGATIVE NEGATIVE Final    Comment: (NOTE) The Xpert Xpress SARS-CoV-2/FLU/RSV assay is intended as an aid in  the diagnosis of influenza from Nasopharyngeal swab specimens and  should not be used as a sole basis for treatment. Nasal washings and  aspirates are unacceptable for Xpert Xpress SARS-CoV-2/FLU/RSV  testing. Fact Sheet for Patients: PinkCheek.be Fact Sheet for Healthcare  Providers: GravelBags.it This test is not yet approved or cleared by the [place] FDA and  has been authorized for detection and/or diagnosis of SARS-CoV-2 by  FDA under an Emergency Use Authorization (EUA). This EUA will remain  in effect (meaning this test can be used) for the duration of the  Covid-19 declaration under Section 564(b)(1) of the Act, 21  U.S.C. section 360bbb-3(b)(1), unless the authorization is  terminated or revoked. Performed at [facility], [address] [place], [place] [address]   MRSA PCR Screening     Status: None   Collection Time: 11/25/19  5:36 AM   Specimen: Nasopharyngeal  Result Value Ref Range Status   MRSA by PCR NEGATIVE NEGATIVE Final    Comment:        The GeneXpert MRSA Assay (FDA approved for NASAL specimens only), is one component of a comprehensive MRSA colonization surveillance program. It is not intended to diagnose MRSA infection nor to guide or monitor treatment for MRSA infections. Performed at [facility], [address] [place], [place] [address]          Radiology Studies: No results found.      Scheduled Meds: . vitamin C  500 mg Oral Daily  . atorvastatin  40 mg Oral Q M,W,F  . Chlorhexidine Gluconate Cloth  6 each Topical Daily  . cyanocobalamin  1,000 mcg Intramuscular Daily  . divalproex  250 mg Oral BID  . famotidine  40 mg Oral QHS  . gabapentin  300 mg Oral QHS  . levothyroxine  12.5 mcg Intravenous q AM  . metoprolol succinate  25 mg Oral BID  . traZODone  50 mg Oral QHS   Continuous Infusions: . dextrose 10 % 1,000 mL with potassium chloride 20 mEq infusion 150 mL/hr at 11/27/19 1224  . folic acid (FOLVITE) IVPB Stopped (11/26/19 1948)  . thiamine injection 500 mg (11/27/19 1226)   Followed by  . [START ON 11/28/2019] thiamine injection       LOS: 4 days    Time spent: over 41 min    Val Riles, MD [facility]   To  contact the attending provider between 7A-7P or the covering provider during after hours 7P-7A, please log into the web site [email or web address] and access using universal [place] password for that web site. If you do not have the password, please call the hospital operator.  11/27/2019, 1:52 PM

## 2019-11-27 NOTE — Progress Notes (Signed)
Daily Progress Note   Patient Name: Brianna Woods       Date: 11/27/2019 DOB: 1935-06-27  Age: 85 y.o. MRN#: [id number] Attending Physician: Val Riles, MD Primary Care Physician: Ria Bush, MD Admit Date: 11/22/2019  Reason for Consultation/Follow-up: Establishing goals of care  Subjective/GOC: Patient slightly more awake/alert today but remains disoriented and poor progression with PT/SLP. She does not appear to be in pain or discomfort.   No family at bedside. Spoke with son, [name] via telephone to provide update. [name] shares that he plans to discuss with his daughter this evening and make decision about hospice tomorrow. I did explain concerns with ongoing poor progression with PT/SLP and concern that once IVF are discontinued, she may decline quickly with poor oral intake. He does acknowledge "she can't live off IV's." Discussed hospice philosophy and comfort feeds.   Updated Dr. Dwyane Dee on my conversation with son.   Length of Stay: 4  Current Medications: Scheduled Meds:  . vitamin C  500 mg Oral Daily  . atorvastatin  40 mg Oral Q M,W,F  . Chlorhexidine Gluconate Cloth  6 each Topical Daily  . cyanocobalamin  1,000 mcg Intramuscular Daily  . divalproex  250 mg Oral BID  . famotidine  40 mg Oral QHS  . gabapentin  300 mg Oral QHS  . levothyroxine  12.5 mcg Intravenous q AM  . metoprolol succinate  25 mg Oral BID  . traZODone  50 mg Oral QHS    Continuous Infusions: . dextrose 10 % 1,000 mL with potassium chloride 20 mEq infusion 150 mL/hr at 11/27/19 1224  . folic acid (FOLVITE) IVPB 1 mg (11/27/19 1700)  . thiamine injection 500 mg (11/27/19 1226)   Followed by  . [START ON 11/28/2019] thiamine injection      PRN Meds: acetaminophen **OR** acetaminophen,  hydrALAZINE, ketorolac, LORazepam, LORazepam, magnesium hydroxide, morphine injection, ondansetron **OR** ondansetron (ZOFRAN) IV, sodium chloride  Physical Exam Vitals and nursing note reviewed.  Constitutional:      Appearance: She is ill-appearing.  HENT:     Head: Normocephalic and atraumatic.  Pulmonary:     Effort: No tachypnea, accessory muscle usage or respiratory distress.  Skin:    General: Skin is warm and dry.  Neurological:     Mental Status: She is easily aroused.  Comments: Periods of agitation, disoriented, does not follow commands  Psychiatric:        Attention and Perception: She is inattentive.        Cognition and Memory: Cognition is impaired.            Vital Signs: BP (!) 174/80 (BP Location: Right Leg)   Pulse 74   Temp (!) 95.8 F (35.4 C)   Resp 16   Ht 5\' 2"  (1.575 m)   Wt 62.6 kg   SpO2 100%   BMI 25.24 kg/m  SpO2: SpO2: 100 % O2 Device: O2 Device: Room Air O2 Flow Rate:    Intake/output summary:   Intake/Output Summary (Last 24 hours) at 11/27/2019 1736 Last data filed at 11/27/2019 0500 Gross per 24 hour  Intake 3259.84 ml  Output --  Net 3259.84 ml   LBM: Last BM Date: (unknown) Baseline Weight: Weight: 62.6 kg Most recent weight: Weight: 62.6 kg       Palliative Assessment/Data: PPS 20%    Flowsheet Rows     Most Recent Value  Intake Tab  Referral Department  Hospitalist  Unit at Time of Referral  Med/Surg Unit  Palliative Care Primary Diagnosis  Neurology  Palliative Care Type  New Palliative care  Reason for referral  Clarify Goals of Care  Date first seen by Palliative Care  11/25/19  Clinical Assessment  Palliative Performance Scale Score  20%  Psychosocial & Spiritual Assessment  Palliative Care Outcomes  Patient/Family meeting held?  Yes  Who was at the meeting?  son via telephone  Palliative Care Outcomes  Clarified goals of care, Counseled regarding hospice, Provided end of life care assistance, Provided  psychosocial or spiritual support, ACP counseling assistance      Patient Active Problem List   Diagnosis Date Noted  . Palliative care by specialist   . Hypokalemia   . Hypoglycemia   . Adult failure to thrive   . Hypernatremia   . Altered mental status 11/23/2019  . Encephalopathy   . Somnolence 10/13/2019  . Low back pain 02/11/2019  . Acquired hypothyroidism 02/11/2019  . Injury of face 11/21/2018  . MDD (major depressive disorder), single episode, moderate ([place]) 07/09/2018  . Glaucoma   . Osteopenia 06/09/2018  . Hiatal hernia 06/09/2018  . Diverticulosis 06/09/2018  . Weight loss 05/23/2018  . Dysphagia 05/23/2018  . Insomnia 05/23/2018  . Uterine mass 05/23/2018  . Tricompartment osteoarthritis of right knee 02/13/2018  . Skin rash 04/21/2016  . Increased endometrial stripe thickness 10/21/2015  . Nephrolithiasis   . Pedal edema 09/25/2015  . Generalized abdominal pain 07/26/2015  . Kidney cyst, acquired 07/13/2015  . CAD (coronary artery disease) 07/13/2015  . Atherosclerosis of abdominal aorta ([place]) 07/13/2015  . Fatty pancreas 07/13/2015  . Goals of care, counseling/discussion 03/22/2015  . Intertrigo 03/22/2015  . Advanced care planning/counseling discussion 09/21/2014  . Medicare annual wellness visit, subsequent 03/20/2014  . Urinary incontinence 03/09/2014  . Chest discomfort 08/15/2013  . Anxiety attack 05/18/2012  . History of right bundle branch block   . Vitamin D deficiency 02/10/2012  . Dizziness 01/22/2012  . Fibromyalgia   . History of CVA (cerebrovascular accident)   . Asthma   . Nodular goiter, toxic or with hyperthyroidism   . GERD (gastroesophageal reflux disease)   . Mixed incontinence   . HTN (hypertension)   . HLD (hyperlipidemia)     Palliative Care Assessment & Plan   Patient Profile:  85 y.o. female  with past  medical history of asthma, CAD, fibromyalgia, HTN, dyslipidemia admitted on 11/22/2019 with altered mental status and  abnormal labs from [facility]. Hospital admission for acute metabolic encephalopathy, hypothermia, hypoglycemia. Antibiotics started empirically, workup does not reveal infection. Labs revealed vitamin B12 and folate deficiency. CT head negative for acute findings, but age-related atrophy and chronic microvascular ischemic changes, old left inferior frontal infarct and encephalomalacia. Patient remains encephalopathic and unable to work with PT/SLP. Palliative medicine consultation for goals of care.   Assessment: SIRS Hypothermia Hypotension Acute metabolic encephalopathy Vitamin B12 deficiency Folate deficiency Hypernatremia Hypercholoremia Anemia Thrombocytopenia  Recommendations/Plan: DNR/DNI. NO feeding tube placement. Continue current plan of care and medical management.  Son considering initiation of hospice services at SNF. Son wishes for ongoing medical management and plans to make his decision about hospice in the next 24 hours. Updated attending.   Code Status: DNR/DNI   Code Status Orders  (From admission, onward)         Start     Ordered   11/23/19 0941  Do not attempt resuscitation (DNR)  Continuous    Question Answer Comment  In the event of cardiac or respiratory ARREST Do not call a "code blue"   In the event of cardiac or respiratory ARREST Do not perform Intubation, CPR, defibrillation or ACLS   In the event of cardiac or respiratory ARREST Use medication by any route, position, wound care, and other measures to relive pain and suffering. May use oxygen, suction and manual treatment of airway obstruction as needed for comfort.      11/23/19 0941        Code Status History    Date Active Date Inactive Code Status Order ID Comments User Context   11/23/2019 0439 11/23/2019 0941 Full Code [id number]  Mansy, Arvella Merles, MD ED   Advance Care Planning Activity      Prognosis:  Poor prognosis with declining functional/cognitive/nutritional status, adult failure  to thrive likely from dementia with atrophy and microvascular changes on CT head.  Discharge Planning: To Be Determined  Care plan was discussed with RN, son, updated Dr. Dwyane Dee via epic chat  Thank you for allowing the Palliative Medicine Team to assist in the care of this patient.   Time In: 1600 Time Out: 1615 Total Time 15 Prolonged Time Billed no      Greater than 50%  of this time was spent counseling and coordinating care related to the above assessment and plan.  Ihor Dow, DNP, FNP-C Palliative Medicine Team  Phone: [phone] Fax: [phone]  Please contact Palliative Medicine Team phone at [phone] for questions and concerns.

## 2019-11-27 NOTE — Progress Notes (Signed)
Patient had applesauce and water-like regurgitation. Patient appears to be in no distress at this time. Lung sounds unchanged. Will continue to monitor.

## 2019-11-28 ENCOUNTER — Other Ambulatory Visit: Payer: Self-pay

## 2019-11-28 ENCOUNTER — Inpatient Hospital Stay: Payer: Medicare HMO

## 2019-11-28 ENCOUNTER — Encounter: Payer: Self-pay | Admitting: Family Medicine

## 2019-11-28 LAB — URINALYSIS, COMPLETE (UACMP) WITH MICROSCOPIC
Bacteria, UA: NONE SEEN
Bilirubin Urine: NEGATIVE
Glucose, UA: NEGATIVE mg/dL
Hgb urine dipstick: NEGATIVE
Ketones, ur: NEGATIVE mg/dL
Leukocytes,Ua: NEGATIVE
Nitrite: NEGATIVE
Protein, ur: NEGATIVE mg/dL
Specific Gravity, Urine: 1.002 — ABNORMAL LOW (ref 1.005–1.030)
pH: 7 (ref 5.0–8.0)

## 2019-11-28 LAB — BASIC METABOLIC PANEL
Anion gap: 6 (ref 5–15)
BUN: 8 mg/dL (ref 8–23)
CO2: 27 mmol/L (ref 22–32)
Calcium: 8.5 mg/dL — ABNORMAL LOW (ref 8.9–10.3)
Chloride: 112 mmol/L — ABNORMAL HIGH (ref 98–111)
Creatinine, Ser: 0.74 mg/dL (ref 0.44–1.00)
GFR calc Af Amer: >60 mL/min (ref >60)
GFR calc non Af Amer: >60 mL/min (ref >60)
Glucose, Bld: 131 mg/dL — ABNORMAL HIGH (ref 70–99)
Potassium: 3.2 mmol/L — ABNORMAL LOW (ref 3.5–5.1)
Sodium: 145 mmol/L (ref 135–145)

## 2019-11-28 LAB — GLUCOSE, CAPILLARY
Glucose-Capillary: 139 mg/dL — ABNORMAL HIGH (ref 70–99)
Glucose-Capillary: 140 mg/dL — ABNORMAL HIGH (ref 70–99)
Glucose-Capillary: 151 mg/dL — ABNORMAL HIGH (ref 70–99)
Glucose-Capillary: 78 mg/dL (ref 70–99)
Glucose-Capillary: 79 mg/dL (ref 70–99)
Glucose-Capillary: 83 mg/dL (ref 70–99)

## 2019-11-28 LAB — CBC
HCT: 31.2 % — ABNORMAL LOW (ref 36.0–46.0)
Hemoglobin: 10 g/dL — ABNORMAL LOW (ref 12.0–15.0)
MCH: 31.3 pg (ref 26.0–34.0)
MCHC: 32.1 g/dL (ref 30.0–36.0)
MCV: 97.5 fL (ref 80.0–100.0)
Platelets: 97 10*3/uL — ABNORMAL LOW (ref 150–400)
RBC: 3.2 MIL/uL — ABNORMAL LOW (ref 3.87–5.11)
RDW: 16.9 % — ABNORMAL HIGH (ref 11.5–15.5)
WBC: 13 10*3/uL — ABNORMAL HIGH (ref 4.0–10.5)
nRBC: 0.2 % (ref 0.0–0.2)

## 2019-11-28 LAB — PHOSPHORUS: Phosphorus: 3.6 mg/dL (ref 2.5–4.6)

## 2019-11-28 LAB — MAGNESIUM: Magnesium: 2.2 mg/dL (ref 1.7–2.4)

## 2019-11-28 LAB — PROCALCITONIN: Procalcitonin: <0.1 ng/mL

## 2019-11-28 IMAGING — CT CT CHEST W/O CM
2 of 4 series · 13 of 36 positions shown, 16 images · non-contrast
Comparison: [DATE]

CLINICAL DATA: Failure to thrive, admitted with altered mental
status and abnormal labs. Patient remains in cephalo pathic after
several days admission.

EXAM:
CT CHEST, ABDOMEN AND PELVIS WITHOUT CONTRAST
TECHNIQUE: Multidetector CT imaging of the chest, abdomen and pelvis was
performed following the standard protocol without IV contrast.

[Series 504: axial st · axial · 0.74mm/px · z∈[-610,-115]mm · 10 of 119 slices shown, 13 images]
[im 10/119  mediastinal]
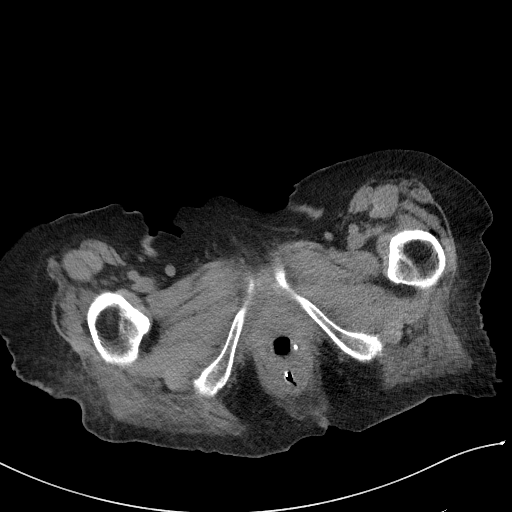
[im 10/119  lung]
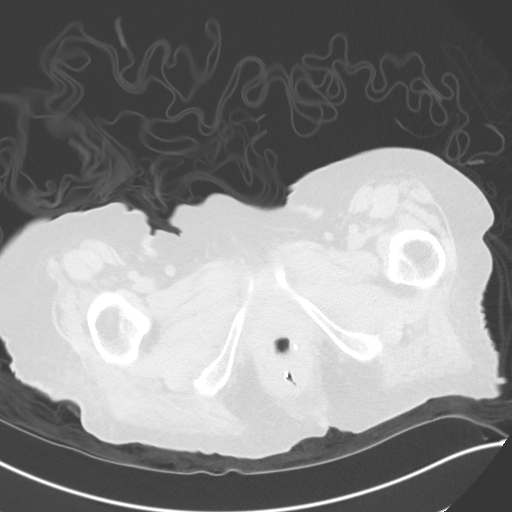
[im 20/119  lung]
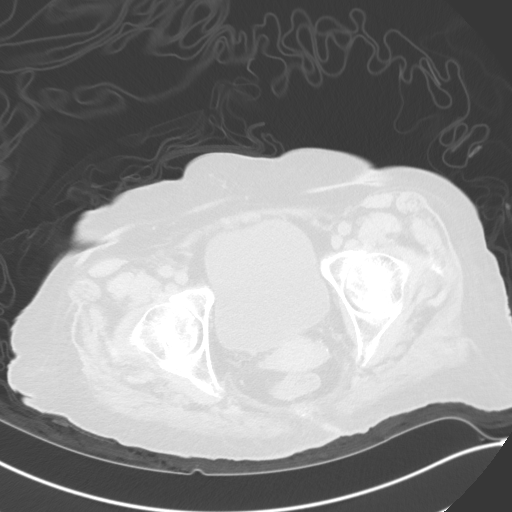
[im 30/119  lung]
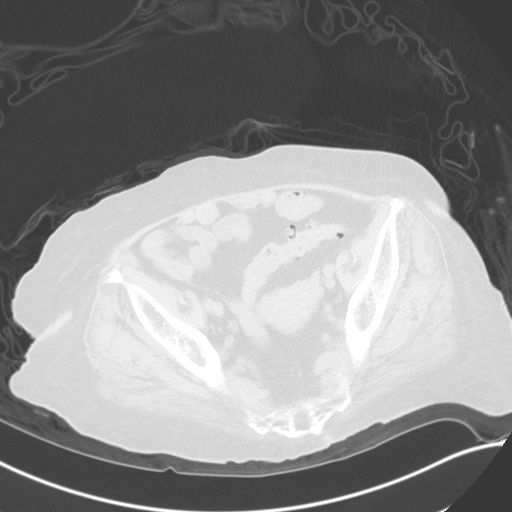
[im 40/119  lung]
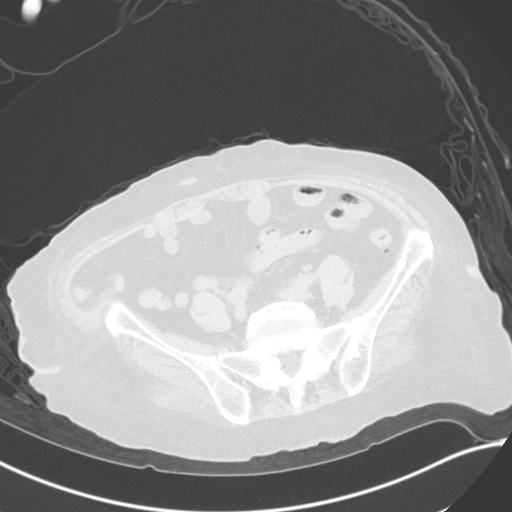
[im 50/119  mediastinal]
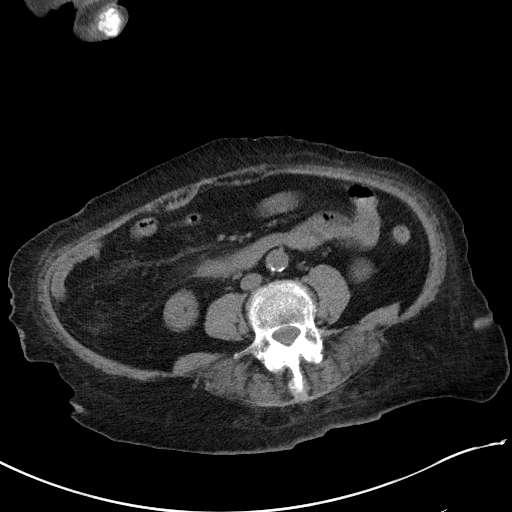
[im 50/119  lung]
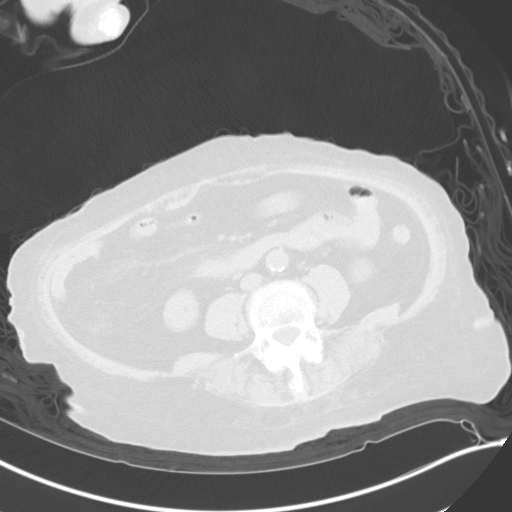
[im 69/119  lung]
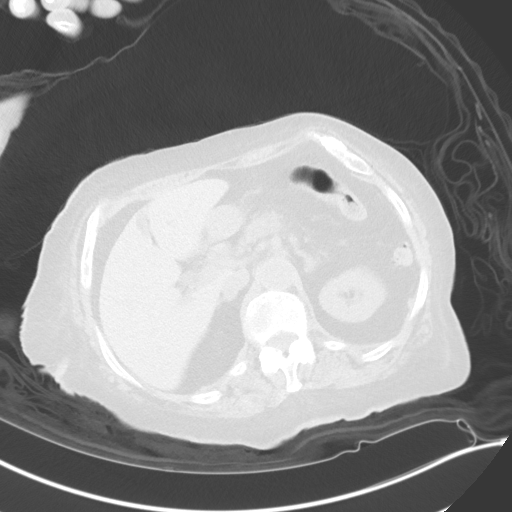
[im 79/119  lung]
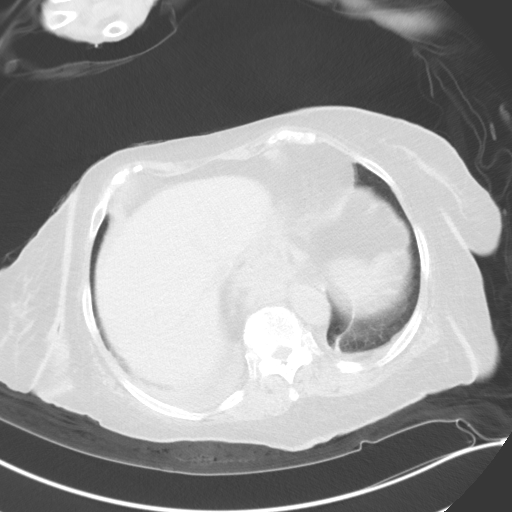
[im 89/119  lung]
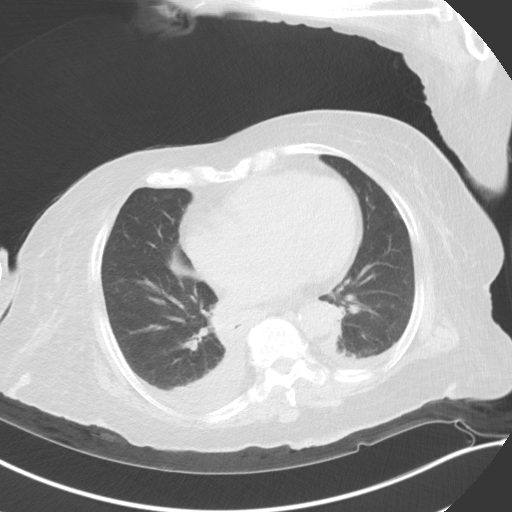
[im 99/119  mediastinal]
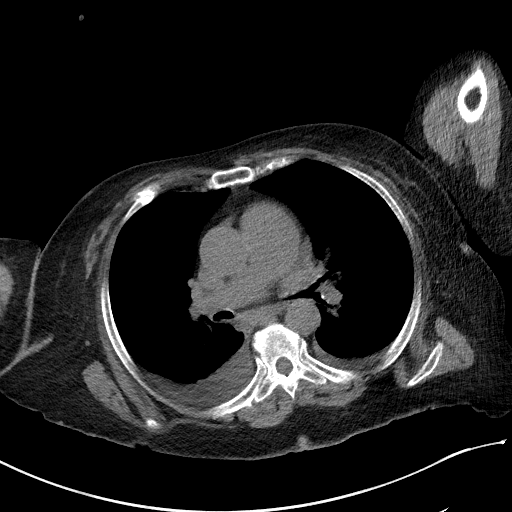
[im 99/119  lung]
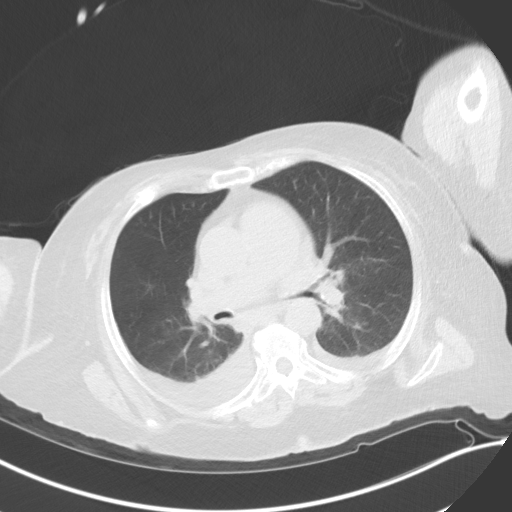
[im 109/119  lung]
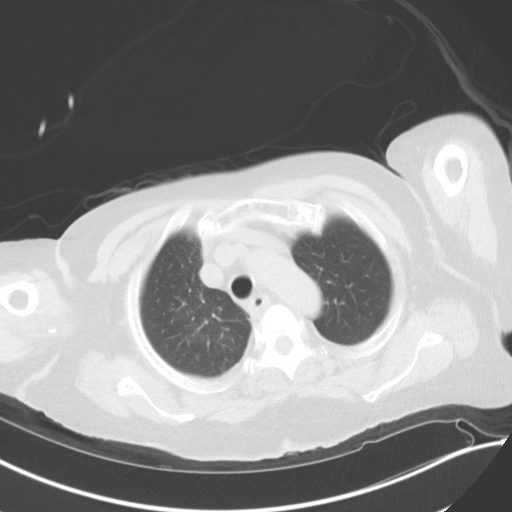

[Series 506: coronal · coronal · 0.87mm/px · 3 of 127 slices shown]
[im 26/127  lung]
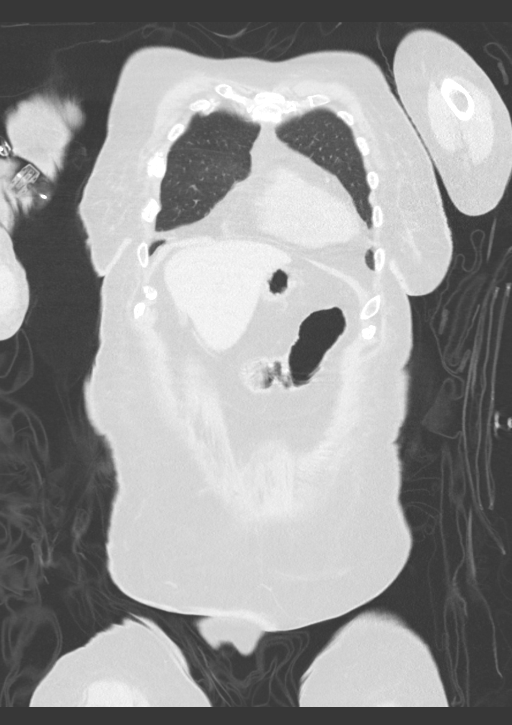
[im 51/127  lung]
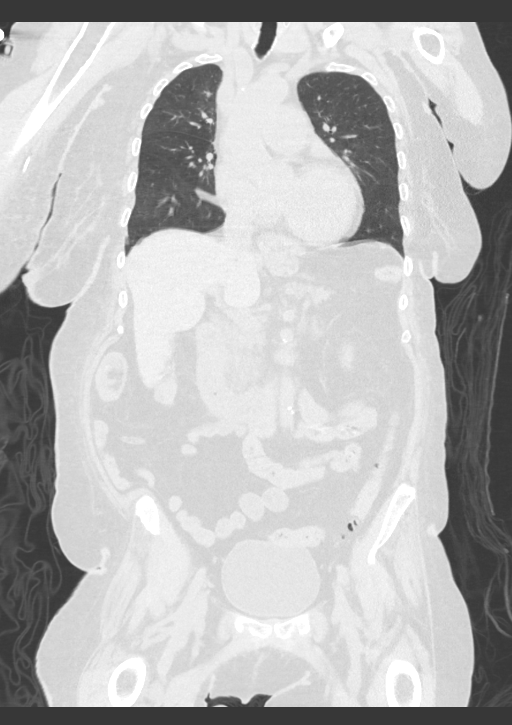
[im 76/127  lung]
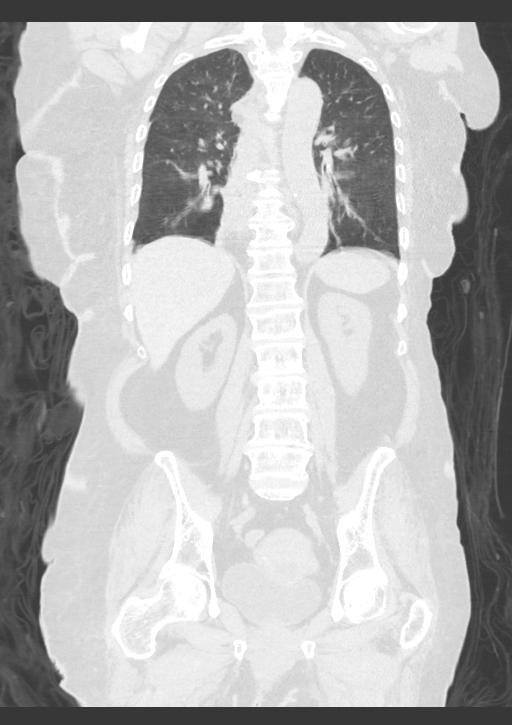

[13 of 36 positions shown; findings below may reference images not displayed]

FINDINGS: CT CHEST FINDINGS

Cardiovascular: Main pulmonary artery 3.4 cm. Scattered aortic
atherosclerosis. The left coronary circulation with calcified
coronary artery disease. No pericardial effusion. Heart size is
normal. Limited assessment of vascular structures in the chest due
to lack of intravenous contrast.

Mediastinum/Nodes: Left hemi thyroid is enlarged measuring 3.5 x
cm, previously referenced as enlarged on a thyroid ultrasound from
[0P]. Large hiatal hernia with much of the stomach herniated into
the chest. Mild circumferential distal esophageal thickening. No
adenopathy in the chest.

Lungs/Pleura: Tiny pulmonary nodule in the right upper lobe measures
4 mm (image 21, series 4) calcified granuloma in the right middle
lobe. Basilar atelectasis on the left. Small to moderate right
effusion and trace left effusion also with right basilar
atelectasis. Airways are patent.

Musculoskeletal: Subacute and chronic right-sided rib fractures
involving ribs 3 through 6 on the right anteriorly. No chest wall
mass.

CT ABDOMEN PELVIS FINDINGS

Hepatobiliary: Low-density area in the medial segment of the left
hepatic lobe along the fissure for falciform ligament is likely
focal fat deposition. Post cholecystectomy with post cholecystectomy
ductal distension that is similar to the previous study.

Pancreas: Peripancreatic stranding has developed since the previous
exam with stranding about the pancreatic head and uncinate and
pancreatic neck and proximal body.

Spleen: Spleen is normal in size without focal lesion.

Adrenals/Urinary Tract: Adrenal glands are normal.

Nonobstructing right upper pole calculus at 8 mm similar to the
prior exam. No hydronephrosis. Low-density lesion arising from
lateral left kidney likely a small cyst.

Stomach/Bowel: Colonic diverticulosis. Bowel management tube in the
rectum. No acute gastrointestinal process. Normal appendix.

Large hiatal hernia with greater than 50% of the stomach herniated
into the chest.

Vascular/Lymphatic: Atheromatous plaque in the abdominal aorta
without aneurysm. No adenopathy in the upper abdomen or in the
retroperitoneum.

No pelvic adenopathy.

Reproductive: Ill-defined low-attenuation areas associated with the
cervix are unchanged over a series of prior exams. Likely
corresponding to nabothian cysts seen on previous sonogram. No
adnexal mass.

Other: No signs of hernia, free air or ascites.

Musculoskeletal: Signs of subacute and/or chronic fractures
involving anterior right-sided ribs. Spinal degenerative change no
acute or destructive bone process.
IMPRESSION: 1. Peripancreatic stranding indicative of acute interstitial
pancreatitis. Correlate with pancreatic enzymes.
2. Post cholecystectomy biliary ductal dilation similar to imaging
dating back to [0P]. Correlate with biliary enzymes to determine
whether this is a contributing facture to changes of pancreatitis.
3. Subacute and/or chronic right-sided rib fractures as described.
4. Bilateral pleural effusions and basilar atelectasis.
5. Right-sided nephrolithiasis with large calculus in the upper pole
as before.
6. Large hiatal hernia with greater than 50% of the stomach
herniated into the chest.
7. 4 mm pulmonary nodule right upper lobe. No follow-up needed if
patient is low-risk. Non-contrast chest CT can be considered in 12
months if patient is high-risk. This recommendation follows the
consensus statement: Guidelines for Management of Incidental
Pulmonary Nodules Detected on CT Images: From the [HOSPITAL]
8. Enlargement of the left hemi thyroid enlarged on previous thyroid
ultrasound. Shown to have history of Graves disease. No follow-up
recommended unless clinically warranted based on previous imaging
and workup.(Ref: [HOSPITAL]. [DATE]): 143-50).

Aortic Atherosclerosis ([0P]-[0P]).

## 2019-11-28 IMAGING — CT CT ABD-PELV W/O CM
2 of 4 series · 13 of 36 positions shown, 16 images · non-contrast
Comparison: [DATE]

CLINICAL DATA: Failure to thrive, admitted with altered mental
status and abnormal labs. Patient remains in cephalo pathic after
several days admission.

EXAM:
CT CHEST, ABDOMEN AND PELVIS WITHOUT CONTRAST
TECHNIQUE: Multidetector CT imaging of the chest, abdomen and pelvis was
performed following the standard protocol without IV contrast.

[Series 2: axial st · axial · 0.74mm/px · z∈[-610,-115]mm · 10 of 119 slices shown, 13 images]
[im 10/119  mediastinal]
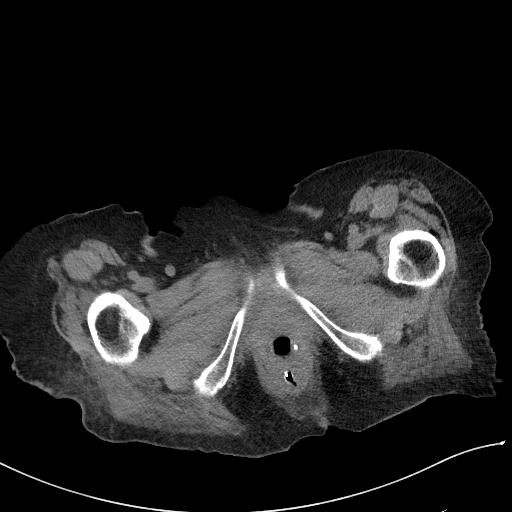
[im 10/119  lung]
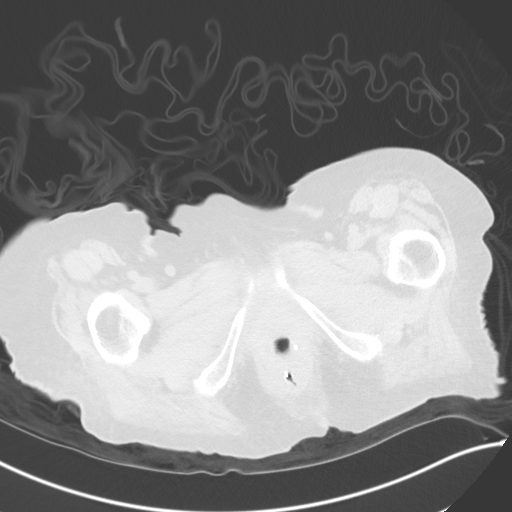
[im 20/119  lung]
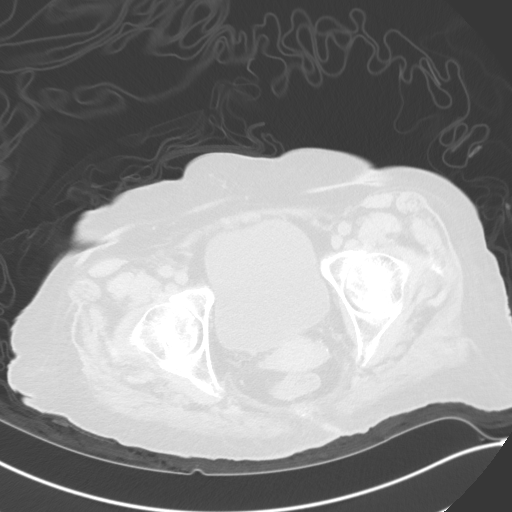
[im 30/119  lung]
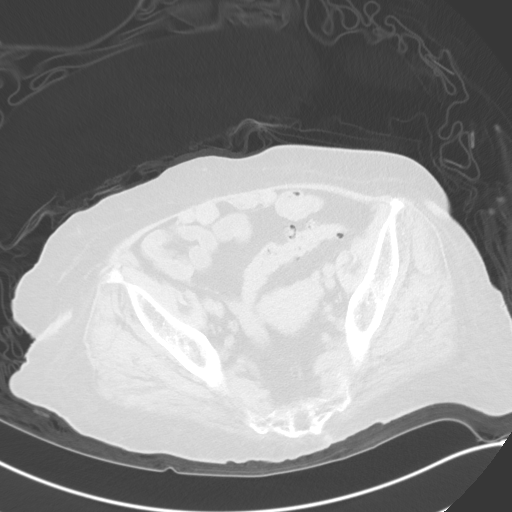
[im 40/119  lung]
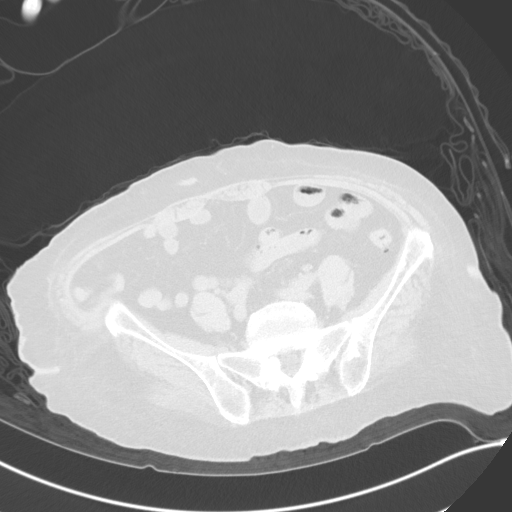
[im 50/119  mediastinal]
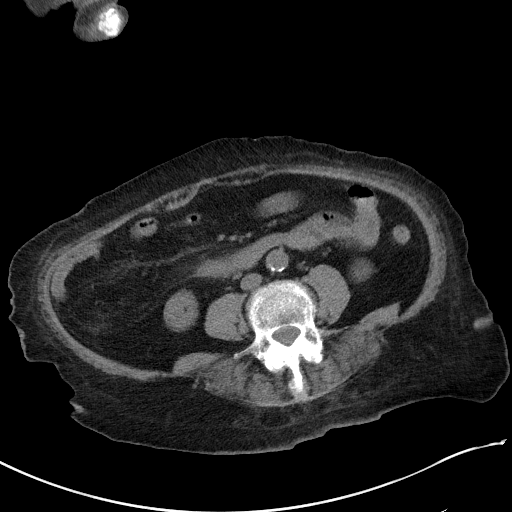
[im 50/119  lung]
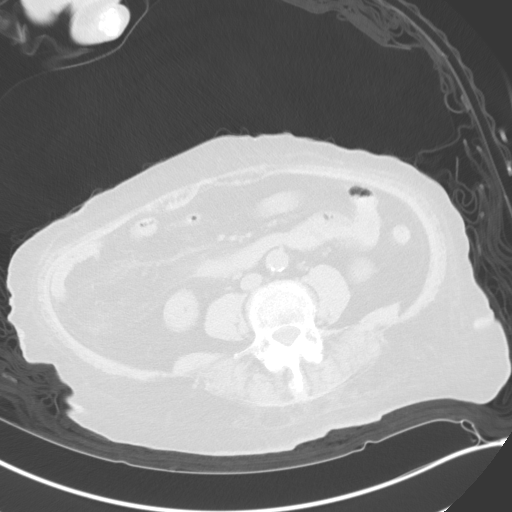
[im 69/119  lung]
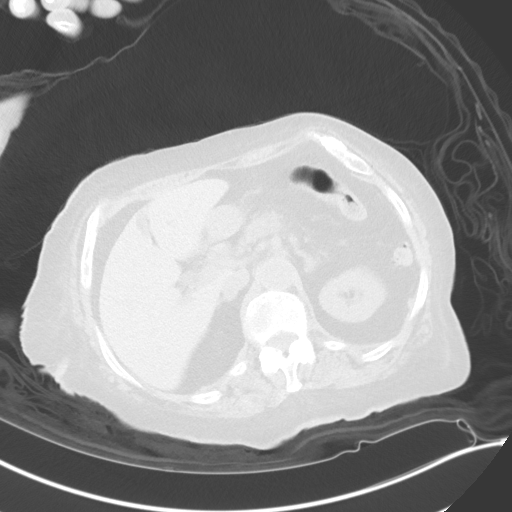
[im 79/119  lung]
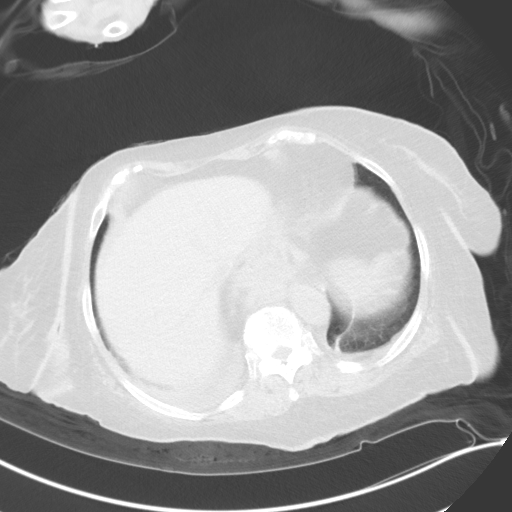
[im 89/119  lung]
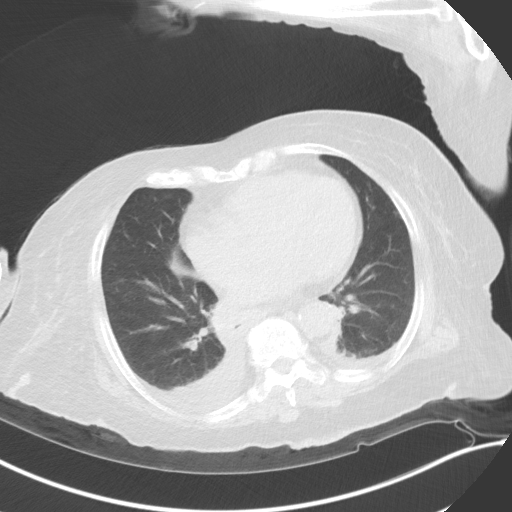
[im 99/119  mediastinal]
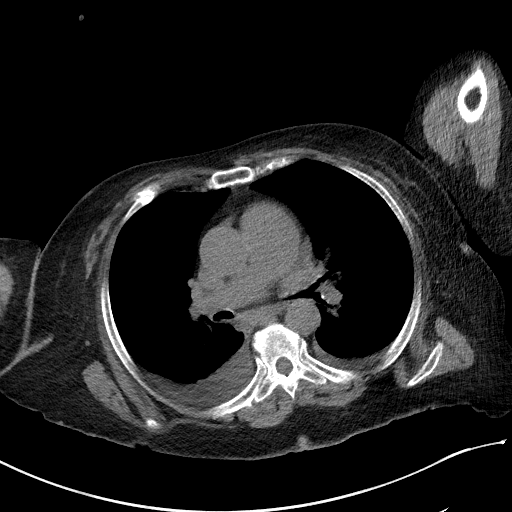
[im 99/119  lung]
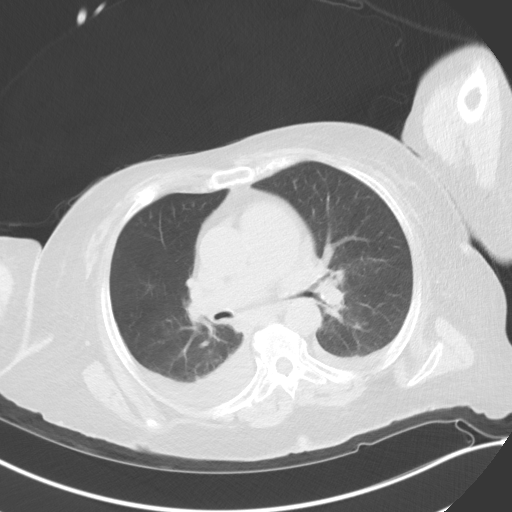
[im 109/119  lung]
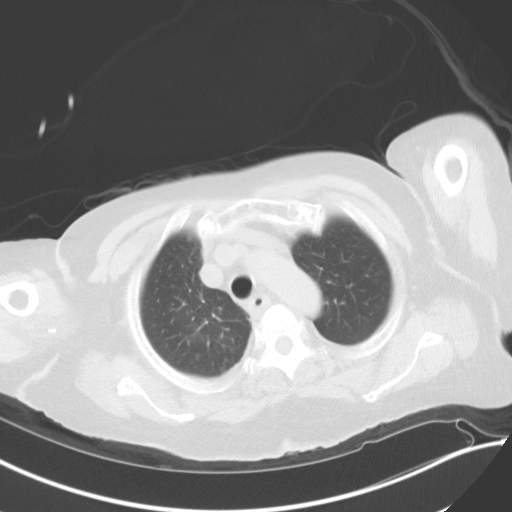

[Series 5: coronal · coronal · 0.87mm/px · 3 of 127 slices shown]
[im 26/127  lung]
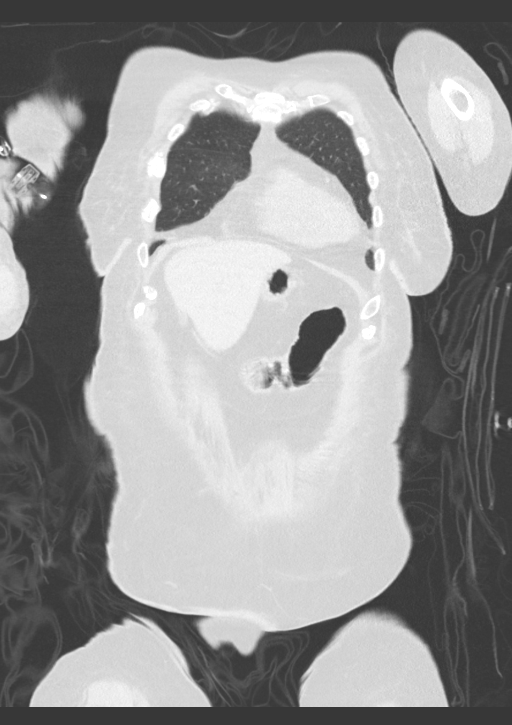
[im 51/127  lung]
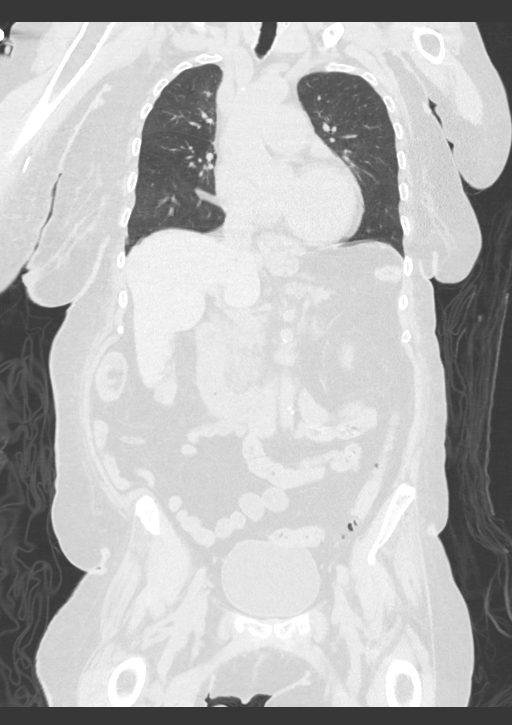
[im 76/127  lung]
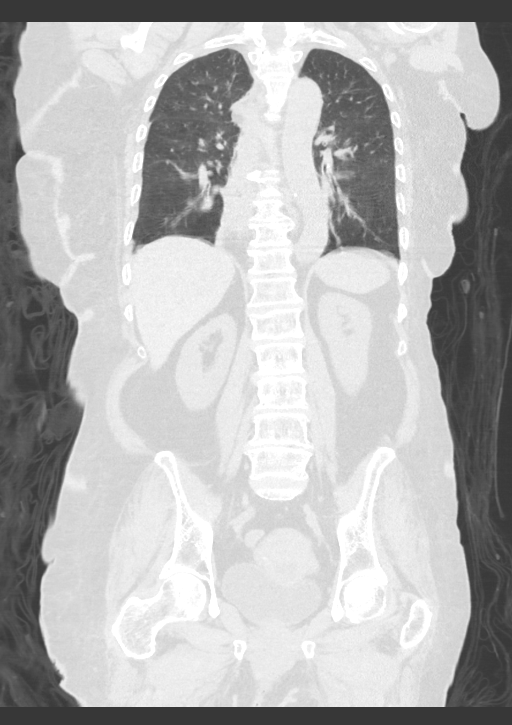

[13 of 36 positions shown; findings below may reference images not displayed]

FINDINGS: CT CHEST FINDINGS

Cardiovascular: Main pulmonary artery 3.4 cm. Scattered aortic
atherosclerosis. The left coronary circulation with calcified
coronary artery disease. No pericardial effusion. Heart size is
normal. Limited assessment of vascular structures in the chest due
to lack of intravenous contrast.

Mediastinum/Nodes: Left hemi thyroid is enlarged measuring 3.5 x
cm, previously referenced as enlarged on a thyroid ultrasound from
[0P]. Large hiatal hernia with much of the stomach herniated into
the chest. Mild circumferential distal esophageal thickening. No
adenopathy in the chest.

Lungs/Pleura: Tiny pulmonary nodule in the right upper lobe measures
4 mm (image 21, series 4) calcified granuloma in the right middle
lobe. Basilar atelectasis on the left. Small to moderate right
effusion and trace left effusion also with right basilar
atelectasis. Airways are patent.

Musculoskeletal: Subacute and chronic right-sided rib fractures
involving ribs 3 through 6 on the right anteriorly. No chest wall
mass.

CT ABDOMEN PELVIS FINDINGS

Hepatobiliary: Low-density area in the medial segment of the left
hepatic lobe along the fissure for falciform ligament is likely
focal fat deposition. Post cholecystectomy with post cholecystectomy
ductal distension that is similar to the previous study.

Pancreas: Peripancreatic stranding has developed since the previous
exam with stranding about the pancreatic head and uncinate and
pancreatic neck and proximal body.

Spleen: Spleen is normal in size without focal lesion.

Adrenals/Urinary Tract: Adrenal glands are normal.

Nonobstructing right upper pole calculus at 8 mm similar to the
prior exam. No hydronephrosis. Low-density lesion arising from
lateral left kidney likely a small cyst.

Stomach/Bowel: Colonic diverticulosis. Bowel management tube in the
rectum. No acute gastrointestinal process. Normal appendix.

Large hiatal hernia with greater than 50% of the stomach herniated
into the chest.

Vascular/Lymphatic: Atheromatous plaque in the abdominal aorta
without aneurysm. No adenopathy in the upper abdomen or in the
retroperitoneum.

No pelvic adenopathy.

Reproductive: Ill-defined low-attenuation areas associated with the
cervix are unchanged over a series of prior exams. Likely
corresponding to nabothian cysts seen on previous sonogram. No
adnexal mass.

Other: No signs of hernia, free air or ascites.

Musculoskeletal: Signs of subacute and/or chronic fractures
involving anterior right-sided ribs. Spinal degenerative change no
acute or destructive bone process.
IMPRESSION: 1. Peripancreatic stranding indicative of acute interstitial
pancreatitis. Correlate with pancreatic enzymes.
2. Post cholecystectomy biliary ductal dilation similar to imaging
dating back to [0P]. Correlate with biliary enzymes to determine
whether this is a contributing facture to changes of pancreatitis.
3. Subacute and/or chronic right-sided rib fractures as described.
4. Bilateral pleural effusions and basilar atelectasis.
5. Right-sided nephrolithiasis with large calculus in the upper pole
as before.
6. Large hiatal hernia with greater than 50% of the stomach
herniated into the chest.
7. 4 mm pulmonary nodule right upper lobe. No follow-up needed if
patient is low-risk. Non-contrast chest CT can be considered in 12
months if patient is high-risk. This recommendation follows the
consensus statement: Guidelines for Management of Incidental
Pulmonary Nodules Detected on CT Images: From the [HOSPITAL]
8. Enlargement of the left hemi thyroid enlarged on previous thyroid
ultrasound. Shown to have history of Graves disease. No follow-up
recommended unless clinically warranted based on previous imaging
and workup.(Ref: [HOSPITAL]. [DATE]): 143-50).

Aortic Atherosclerosis ([0P]-[0P]).

## 2019-11-28 MED ORDER — METRONIDAZOLE IN NACL 5-0.79 MG/ML-% IV SOLN
500.0000 mg | Freq: Three times a day (TID) | INTRAVENOUS | Status: DC
  Administered 2019-11-28 – 2019-11-29 (×4): 500 mg via INTRAVENOUS
  Filled 2019-11-28 (×6): qty 100

## 2019-11-28 MED ORDER — POTASSIUM CHLORIDE 10 MEQ/100ML IV SOLN
10.0000 meq | INTRAVENOUS | Status: AC
  Administered 2019-11-28 (×4): 10 meq via INTRAVENOUS
  Filled 2019-11-28 (×3): qty 100

## 2019-11-28 MED ORDER — LEVOFLOXACIN IN D5W 750 MG/150ML IV SOLN
750.0000 mg | INTRAVENOUS | Status: DC
  Filled 2019-11-28: qty 150

## 2019-11-28 MED ORDER — POTASSIUM CL IN DEXTROSE 5% 20 MEQ/L IV SOLN
20.0000 meq | INTRAVENOUS | Status: DC
  Administered 2019-11-28: 10:00:00 20 meq via INTRAVENOUS
  Filled 2019-11-28 (×5): qty 1000

## 2019-11-28 MED ORDER — CLONIDINE HCL 0.3 MG/24HR TD PTWK
0.3000 mg | MEDICATED_PATCH | TRANSDERMAL | Status: DC
  Administered 2019-11-28: 0.3 mg via TRANSDERMAL
  Filled 2019-11-28: qty 1

## 2019-11-28 MED ORDER — LEVOFLOXACIN IN D5W 750 MG/150ML IV SOLN
750.0000 mg | INTRAVENOUS | Status: DC
  Administered 2019-11-28: 750 mg via INTRAVENOUS
  Filled 2019-11-28: qty 150

## 2019-11-28 NOTE — Care Management Important Message (Signed)
Important Message  Patient Details  Name: Brianna Woods MRN: [id number] Date of Birth: 1934-10-03   Medicare Important Message Given:  Yes     Juliann Pulse A Laveyah Oriol 11/28/2019, 2:32 PM

## 2019-11-28 NOTE — Progress Notes (Signed)
PROGRESS NOTE    Brianna Woods  TGY:[id number] DOB: [birth year] DOA: 11/22/2019 PCP: Ria Bush, MD   Brief Narrative:  Brianna Woods  is a 85 y.o. African-American female with a known history of multiple medical problems that are mentioned below, including asthma, coronary artery disease, fibromyalgia, hypertension, dyslipidemia, who presented to the emergency room with acute onset of altered mental status and abnormal labs at [facility].  She is ambulatory at baseline over the last few days she not been able to ambulate.  She is been having significantly diminished appetite and decreased p.o. intake.  She had labs drawn revealed hypernatremia that was up to 172 and hypokalemia as well as hyperchloremia.  She was also hypoglycemic with a blood glucose of 44 that was up to 62 here on arrival.  No reported nausea or vomiting or diarrhea or abdominal pain.  No reported chest pain dyspnea or cough or wheezing.  The patient was nonverbal during my interview and fairly somnolent and therefore no history could be obtained from her.  She's been admitted for acute metabolic encephalopathy, hypothermia, and hypoglycemia.  She's been treated with antibiotics empirically for presumed infection, though no definitive evidence yet.  Her labs have shown vitamin b12 and folate deficiency, which are now being replaced.  Palliative care has been consulted due to her recent decline.   Assessment & Plan:   Active Problems:   Goals of care, counseling/discussion   Altered mental status   Encephalopathy   Hypernatremia   Palliative care by specialist   Hypokalemia   Hypoglycemia   Adult failure to thrive  Goals of Care:  Patient seems to have been declining over the past several weeks.  Moved in with son b/c not doing well independently, found down a few times.  She lived with son, but declined with him as well, falling asleep in conversations, etc.  [facility]ent to [facility].  Updated that she  wasn't eating or drinking, not acting herself, abnormal labs and transferred here. Discussed code status with son, [name] who noted that he thinks she would want to be DNR Will c/s palliative care as it seems she's failing to thrive - appreciate assistance  1.  SIRS  Hypothermia  Hypotension  AMS  R/o Sepsis  - intermittent hypothermia, persists - AM cortisol (18.1), TSH 6.814 with normal free T4 - s/p empiric IV abx cefepime x 5 days, d/c'd on 3/17 as all w/up negative so far  - UA does not appear c/w UTI, CXR without acute abnormality - follow blood and urine cultures NG - MRSA pcr negative --3/19 still low Temp, so empiric antibiotics started again, Levaquin 75 mg IV daily and Flagyl 500 mg every 8 hourly -3/19 extensive work-up procalcitonin negative, follow repeat blood culture, urine culture, CT chest and abdomen to rule out any infection  Acute metabolic encephalopathy - her encephalopathy is persistent, but slightly improved -> she's still significantly disoriented, unable to communicate clearly to me this morning, incomprehensible speech - TSH as above, with vitamin b12 deficiency and folate deficiency,  - high dose thiamine - delirium precautions - prolonged qt on EKG -> f/u repeat EKG  - ativan for agitation for now  # Hypothermia:  - cortisol 18.1, TSH 6.814 with normal free T4 - she's hypoglycemic, which is likely contributing - improved after bair hugger  2.  Hypoglycemia. - s/p D10 w/K 10 IVF, change IV fluid on 3/19 D5 w/K 100 ml/h - Q4 POC BG checks  # Hypernatremia  Hyperchloremia: 2/2 dehydration, (with hyperchloremia, ? Related to NS administration as well?) -  Sodium level improved with D10 IV fluid.  Hyponatremia resolved, sodium 145 today  # Hypophosphatemia: replace and follow  # Hypokalemia:  Replace and follow   4.  Hypothyroidism.  TSH 6.8, could be due to noncompliance/decreased oral intake -We will continue Synthroid IV as patient is not  taking oral medications, n.p.o. due to risk of aspiration  5.  Hypertension. -Held oral meds Cozaar and Toprol-XL due to n.p.o. status  3/19 started clonidine patch 0.3 mg/24-hour Use IV hydralazine as needed, we will monitor BP and titrate medications accordingly  6.  GERD. -We will continue PPI therapy as well as H2 blocker.  7.  Peripheral neuropathy. -We will continue Neurontin.  8.  Dyslipidemia. -We will continue statin therapy.  # Anemia  Thrombocytopenia: thrombocytopenia is new, could be related to sepsis with hypothermia, continue to monitor.   B12 and folate def likely contributing  iron panel iron level 117, saturation 61%, no supplemental needs  Vitamin D deficiency, start vitamin D oral supplement when patient is stable enough to start oral diet  Overall poor prognosis, palliative care is following  DVT prophylaxis: SCD Code Status: DNR Family Communication: son no answer 3/15, palliative care will continue to discuss with the family Disposition Plan:  . Patient came from: SNF            . Anticipated d/c place:SNF with hospice services if family agrees . Barriers to d/c OR conditions which need to be met to effect a safe d/c: pending improvement in electrolyte abnormalities and AMS, palliative care c/s . Still patient is n.p.o. due to risk of aspiration, speech and swallow following.   Consultants:   Palliative care  Procedures:   none  Antimicrobials:  Anti-infectives (From admission, onward)   Start     Dose/Rate Route Frequency Ordered Stop   11/28/19 1000  levofloxacin (LEVAQUIN) IVPB 750 mg     750 mg 100 mL/hr over 90 Minutes Intravenous Every 24 hours 11/28/19 0905     11/28/19 1000  metroNIDAZOLE (FLAGYL) IVPB 500 mg     500 mg 100 mL/hr over 60 Minutes Intravenous Every 8 hours 11/28/19 0905     11/24/19 0630  vancomycin (VANCOREADY) IVPB 750 mg/150 mL  Status:  Discontinued     750 mg 150 mL/hr over 60 Minutes Intravenous Every 24  hours 11/24/19 0644 11/24/19 1118   11/23/19 2200  vancomycin (VANCOREADY) IVPB 750 mg/150 mL  Status:  Discontinued     750 mg 150 mL/hr over 60 Minutes Intravenous Every 24 hours 11/23/19 0838 11/24/19 0644   11/23/19 0600  metroNIDAZOLE (FLAGYL) IVPB 500 mg  Status:  Discontinued     500 mg 100 mL/hr over 60 Minutes Intravenous Every 8 hours 11/23/19 0439 11/23/19 1258   11/23/19 0515  ceFEPIme (MAXIPIME) 2 g in sodium chloride 0.9 % 100 mL IVPB  Status:  Discontinued     2 g 200 mL/hr over 30 Minutes Intravenous Every 12 hours 11/23/19 0439 11/26/19 1203   11/23/19 0445  vancomycin (VANCOCIN) IVPB 1000 mg/200 mL premix  Status:  Discontinued     1,000 mg 200 mL/hr over 60 Minutes Intravenous  Once 11/23/19 0439 11/23/19 0507   11/22/19 2330  vancomycin (VANCOCIN) IVPB 1000 mg/200 mL premix     1,000 mg 200 mL/hr over 60 Minutes Intravenous  Once 11/22/19 2324 11/23/19 0315   11/22/19 2330  vancomycin (VANCOREADY) IVPB 500 mg/100 mL  500 mg 100 mL/hr over 60 Minutes Intravenous  Once 11/22/19 2328 11/23/19 0513   11/22/19 2230  meropenem (MERREM) 1 g in sodium chloride 0.9 % 100 mL IVPB     1 g 200 mL/hr over 30 Minutes Intravenous  Once 11/22/19 2219 11/22/19 2313     Subjective: No overnight issues, patient was seen and examined at bedside.  Patient still somnolent and very sleepy, AAO x0 Patient was moving all extremities spontaneously and was more awake when RN was checking fingerstick blood glucose so she was withdrawing her finger due to pain.  Objective: Vitals:   11/27/19 1708 11/28/19 0026 11/28/19 0852 11/28/19 0948  BP: (!) 174/80 (!) 177/72 107/62   Pulse: 74 72 61   Resp: _0 Temp: (!) 95.8 F (35.4 C) 98.7 F (37.1 C) (!) 94 F (34.4 C) (!) 95 F (35 C)  TempSrc:    Rectal  SpO2: 100% 100% 98%   Weight:      Height:       No intake or output data in the 24 hours ending 11/28/19 1232 Filed Weights   11/22/19 2157 11/22/19 2251  Weight: 62.6 kg  62.6 kg    Examination:  General: Sleepy and  lethargic  Cardiovascular: Heart sounds show a regular rate, and rhythm.  Lungs: Clear to auscultation bilaterally  Abdomen: Soft, nontender, nondistended  Neurological: disoriented, sleepy and lethargic,. Moves all extremities 4 . Cranial nerves II through XII grossly intact. Skin: Warm and dry. No rashes or lesions. Extremities: No clubbing or cyanosis. No edema.    Data Reviewed: I have personally reviewed following labs and imaging studies  CBC: Recent Labs  Lab 11/22/19 2220 11/23/19 0614 11/24/19 0612 11/25/19 0710 11/26/19 0404 11/27/19 0235 11/28/19 0633  WBC 5.2   < > 7.8 8.1 8.7 10.0 13.0*  NEUTROABS 4.0  --  5.0 4.9 5.2  --   --   HGB 9.7*   < > 9.4* 9.8* 9.8* 10.2* 10.0*  HCT 32.9*   < > 31.3* 30.7* 31.2* 31.4* 31.2*  MCV 104.8*   < > 102.0* 98.4 96.9 96.0 97.5  PLT 75*   < > 59* 63* 70* 98* 97*   < > = values in this interval not displayed.   Basic Metabolic Panel: Recent Labs  Lab 11/24/19 0612 11/24/19 0612 11/24/19 2031 11/25/19 0710 11/26/19 0404 11/27/19 0235 11/28/19 0633  NA 152*   < > 153* 154* 152* 147* 145  K 3.4*   < > 3.5 3.7 3.4* 3.4* 3.2*  CL 122*   < > 124* 121* 119* 114* 112*  CO2 26   < > _1 GLUCOSE 97   < > 110* 86 101* 145* 131*  BUN 19   < > _2 CREATININE 0.85   < > 0.82 0.78 0.71 0.84 0.74  CALCIUM 8.8*   < > 8.6* 8.8* 8.7* 8.7* 8.5*  MG 1.7  --   --  2.2 1.8 1.6* 2.2  PHOS 1.7*  --   --  2.2* 2.0* 4.2 3.6   < > = values in this interval not displayed.   GFR: Estimated Creatinine Clearance: 47.2 mL/min (by C-G formula based on SCr of 0.74 mg/dL). Liver Function Tests: Recent Labs  Lab 11/22/19 2220 11/24/19 0612 11/25/19 0710 11/26/19 0404  AST _3 ALT _4 ALKPHOS 69 60 64 63  BILITOT 1.2 0.8  0.9 0.9  PROT 5.4* 5.3* 5.2* 5.2*  ALBUMIN 3.1* 2.8* 2.7* 2.8*   No results for input(s): LIPASE, AMYLASE in the last 168 hours. No  results for input(s): AMMONIA in the last 168 hours. Coagulation Profile: Recent Labs  Lab 11/22/19 2220 11/23/19 0614  INR 1.1 1.1   Cardiac Enzymes: No results for input(s): CKTOTAL, CKMB, CKMBINDEX, TROPONINI in the last 168 hours. BNP (last 3 results) No results for input(s): PROBNP in the last 8760 hours. HbA1C: No results for input(s): HGBA1C in the last 72 hours. CBG: Recent Labs  Lab 11/27/19 1709 11/27/19 2035 11/28/19 0025 11/28/19 0419 11/28/19 0849  GLUCAP 145* 148* 151* 140* 139*   Lipid Profile: No results for input(s): CHOL, HDL, LDLCALC, TRIG, CHOLHDL, LDLDIRECT in the last 72 hours. Thyroid Function Tests: No results for input(s): TSH, T4TOTAL, FREET4, T3FREE, THYROIDAB in the last 72 hours. Anemia Panel: No results for input(s): VITAMINB12, FOLATE, FERRITIN, TIBC, IRON, RETICCTPCT in the last 72 hours. Sepsis Labs: Recent Labs  Lab 11/22/19 2220 11/23/19 0614 11/28/19 0911  PROCALCITON <0.10 <0.10 <0.10  LATICACIDVEN 1.2  --   --     Recent Results (from the past 240 hour(s))  Urine culture     Status: None   Collection Time: 11/22/19 10:20 PM   Specimen: Urine, Catheterized  Result Value Ref Range Status   Specimen Description   Final    URINE, CATHETERIZED Performed at [facility], [address], [place], [place] [address]    Special Requests   Final    NONE Performed at [facility], [address], [place], [place] [address]    Culture   Final    NO GROWTH Performed at [facility], [place] [address] [place], [place] [address]    Report Status 11/24/2019 FINAL  Final  Blood culture (routine x 2)     Status: None   Collection Time: 11/22/19 10:20 PM   Specimen: BLOOD  Result Value Ref Range Status   Specimen Description BLOOD Blood Culture adequate volume  Final   Special Requests   Final    BOTTLES DRAWN AEROBIC AND ANAEROBIC LEFT ANTECUBITAL   Culture   Final    NO GROWTH 5 DAYS Performed at [facility], [address] [place], [place] [address]    Report Status 11/27/2019 FINAL  Final  Blood culture (routine x 2)     Status: None   Collection Time: 11/22/19 10:20 PM   Specimen: BLOOD  Result Value Ref Range Status   Specimen Description BLOOD LEFT WRIST  Final   Special Requests   Final    BOTTLES DRAWN AEROBIC AND ANAEROBIC Blood Culture adequate volume   Culture   Final    NO GROWTH 5 DAYS Performed at [facility], [address] [place], [place] [address]    Report Status 11/27/2019 FINAL  Final  Respiratory Panel by RT PCR (Flu A&B, Covid) - Nasopharyngeal Swab     Status: None   Collection Time: 11/23/19 12:41 AM   Specimen: Nasopharyngeal Swab  Result Value Ref Range Status   SARS Coronavirus 2 by RT PCR NEGATIVE NEGATIVE Final    Comment: (NOTE) SARS-CoV-2 target nucleic acids are NOT DETECTED. The SARS-CoV-2 RNA is generally detectable in upper respiratoy specimens during the acute phase of infection. The lowest concentration of SARS-CoV-2 viral copies this assay can detect is 131 copies/mL. A negative result does not preclude SARS-Cov-2 infection and should not be used as  the sole basis for treatment or other patient management decisions. A negative result may occur with  improper specimen collection/handling, submission of specimen other than nasopharyngeal swab, presence of viral mutation(s) within the areas targeted by this assay, and inadequate number of viral copies (<131 copies/mL). A negative result must be combined with clinical observations, patient history, and epidemiological information. The expected result is Negative. Fact Sheet for Patients:  PinkCheek.be Fact Sheet for Healthcare Providers:  GravelBags.it This test is not yet ap proved or cleared by the [place] FDA and  has been authorized for detection and/or diagnosis of SARS-CoV-2 by FDA under an Emergency  Use Authorization (EUA). This EUA will remain  in effect (meaning this test can be used) for the duration of the COVID-19 declaration under Section 564(b)(1) of the Act, 21 U.S.C. section 360bbb-3(b)(1), unless the authorization is terminated or revoked sooner.    Influenza A by PCR NEGATIVE NEGATIVE Final   Influenza B by PCR NEGATIVE NEGATIVE Final    Comment: (NOTE) The Xpert Xpress SARS-CoV-2/FLU/RSV assay is intended as an aid in  the diagnosis of influenza from Nasopharyngeal swab specimens and  should not be used as a sole basis for treatment. Nasal washings and  aspirates are unacceptable for Xpert Xpress SARS-CoV-2/FLU/RSV  testing. Fact Sheet for Patients: PinkCheek.be Fact Sheet for Healthcare Providers: GravelBags.it This test is not yet approved or cleared by the [place] FDA and  has been authorized for detection and/or diagnosis of SARS-CoV-2 by  FDA under an Emergency Use Authorization (EUA). This EUA will remain  in effect (meaning this test can be used) for the duration of the  Covid-19 declaration under Section 564(b)(1) of the Act, 21  U.S.C. section 360bbb-3(b)(1), unless the authorization is  terminated or revoked. Performed at [facility], [address] [place], [place] [address]   MRSA PCR Screening     Status: None   Collection Time: 11/25/19  5:36 AM   Specimen: Nasopharyngeal  Result Value Ref Range Status   MRSA by PCR NEGATIVE NEGATIVE Final    Comment:        The GeneXpert MRSA Assay (FDA approved for NASAL specimens only), is one component of a comprehensive MRSA colonization surveillance program. It is not intended to diagnose MRSA infection nor to guide or monitor treatment for MRSA infections. Performed at [facility], [address], [place], [place] [address]          Radiology Studies: No results found.      Scheduled Meds: . vitamin C   500 mg Oral Daily  . atorvastatin  40 mg Oral Q M,W,F  . Chlorhexidine Gluconate Cloth  6 each Topical Daily  . cloNIDine  0.3 mg Transdermal Weekly  . cyanocobalamin  1,000 mcg Intramuscular Daily  . divalproex  250 mg Oral BID  . famotidine  40 mg Oral QHS  . gabapentin  300 mg Oral QHS  . levothyroxine  12.5 mcg Intravenous q AM  . metoprolol succinate  25 mg Oral BID  . traZODone  50 mg Oral QHS   Continuous Infusions: . dextrose 5 % with KCl 20 mEq / L 20 mEq (11/28/19 0934)  . folic acid (FOLVITE) IVPB 1 mg (11/27/19 1700)  . levofloxacin (LEVAQUIN) IV    . metronidazole 500 mg (11/28/19 1146)  . potassium chloride 10 mEq (11/28/19 1142)  . thiamine injection 500 mg (11/28/19 1007)   Followed by  . thiamine injection       LOS:  5 days    Time spent: over 30 min    Val Riles, MD [facility]   To contact the attending provider between 7A-7P or the covering provider during after hours 7P-7A, please log into the web site [email or web address] and access using universal [place] password for that web site. If you do not have the password, please call the hospital operator.  11/28/2019, 12:32 PM

## 2019-11-28 NOTE — Progress Notes (Signed)
Initial Nutrition Assessment  DOCUMENTATION CODES:   Severe malnutrition in context of social or environmental circumstances  INTERVENTION:  No appropriate nutrition interventions at this time. Patient is NPO following SLP evaluation. Will continue to monitor for plan of care and outcome of discussions regarding goals of care and make appropriate nutrition interventions as indicated.  Monitor magnesium, potassium, and phosphorus daily for at least 3 days, MD to replete as needed, as pt is at risk for refeeding syndrome given severe malnutrition.  NUTRITION DIAGNOSIS:   Severe Malnutrition related to social / environmental circumstances(inadequate oral intake) as evidenced by severe fat depletion, severe muscle depletion, 20.3% weight loss over the past year.  GOAL:   Patient will meet greater than or equal to 90% of their needs  MONITOR:   Diet advancement, Labs, Weight trends, I & O's  REASON FOR ASSESSMENT:   Malnutrition Screening Tool    ASSESSMENT:   85 year old female with PMHx of HLD, HTN, fibromyalgia, nephrolithiasis, GERD, Barrett's esophagus, asthma, hx CVA, vitamin D deficiency, arthritis, diverticulosis, CAD admitted with SIRS, acute metabolic encephalopathy, hypothermia, peripheral neuropathy.   Met with patient at bedside but she was unable to provide any history. According to chart patient has not been eating or drinking lately. Following SLP evaluation on 3/18 patient was made NPO. Palliative Medicine is following for goals of care.  Patient's weights fluctuate in chart. She was 78.5 kg on 02/11/2019. She is now 62.6 kg (138.01 lbs). She has lost 15.9 kg (20.3% body weight) over the past year, which is significant for time frame.  Medications reviewed and include: vitamin C 500 mg daily, vitamin B12 1000 micrograms daily IM, famotidine, gabapentin, levothyroxine, D5 with KCl 20 mEq/L at 650 mL/hr, folic acid 1 mg daily IV, Levaquin, Flagyl, potassium chloride 10 mEq  IV x 4 today, thiamine 500 mg IV x 2 today followed by 250 mg IV daily.  Labs reviewed: CBG 139-151, Potassium 3.2, Chloride 112. On 3/16 Folate 2.5 and vitamin B12 161. On 3/17 25-hydroxy vitamin D 19.96.  NUTRITION - FOCUSED PHYSICAL EXAM:    Most Recent Value  Orbital Region  Moderate depletion  Upper Arm Region  Severe depletion  Thoracic and Lumbar Region  Severe depletion  Buccal Region  Moderate depletion  Temple Region  Moderate depletion  Clavicle Bone Region  Severe depletion  Clavicle and Acromion Bone Region  Severe depletion  Scapular Bone Region  Moderate depletion  Dorsal Hand  Severe depletion  Patellar Region  Severe depletion  Anterior Thigh Region  Severe depletion  Posterior Calf Region  Severe depletion  Edema (RD Assessment)  None  Hair  Reviewed  Eyes  Unable to assess  Mouth  Unable to assess  Skin  Reviewed  Nails  Reviewed     Diet Order:   Diet Order            Diet NPO time specified Except for: Sips with Meds, Other (See Comments), Ice Chips  Diet effective now             EDUCATION NEEDS:   No education needs have been identified at this time  Skin:  Skin Assessment: Reviewed RN Assessment  Last BM:  11/27/2019 large type 7  Height:   Ht Readings from Last 1 Encounters:  11/22/19 '5\' 2"'  (1.575 m)   Weight:   Wt Readings from Last 1 Encounters:  11/22/19 62.6 kg   Ideal Body Weight:  50 kg  BMI:  Body mass index is  25.24 kg/m.  Estimated Nutritional Needs:   Kcal:  1500-1700  Protein:  75-85 grams  Fluid:  1.5 L/day  Jacklynn Barnacle, MS, RD, LDN Pager number available on Amion

## 2019-11-28 NOTE — Progress Notes (Signed)
PHARMACY NOTE:  ANTIMICROBIAL RENAL DOSAGE ADJUSTMENT  Current antimicrobial regimen includes a mismatch between antimicrobial dosage and estimated renal function.  As per policy approved by the Pharmacy & Therapeutics and Medical Executive Committees, the antimicrobial dosage will be adjusted accordingly.  Current antimicrobial dosage:  Levofloxacin 750mg  IV every 24 hours   Indication: SIRs  Renal Function:  Estimated Creatinine Clearance: 47.2 mL/min (by C-G formula based on SCr of 0.74 mg/dL).  Antimicrobial dosage has been changed to: Levofloxacin 750mg  IV every 48 hours for CrCl <53ml/min   Thank you for allowing pharmacy to be a part of this patient's care.  Pernell Dupre, PharmD, BCPS Clinical Pharmacist 11/28/2019 2:02 PM

## 2019-11-28 NOTE — Progress Notes (Signed)
  Speech Language Pathology Treatment: Dysphagia  Patient Details Name: Brianna Woods MRN: [id number] DOB: [birth year] Today's Date: 11/28/2019 Time: [id number] SLP Time Calculation (min) (ACUTE ONLY): 45 min  Assessment / Plan / Recommendation Clinical Impression  The patient was seen for ongoing assessment of swallowing and readiness for POs.  The patient was awake and verbally responsive.  She tolerated being repositioned in the bed and agreed to try applesauce ("I'm hungry").  The patient has mitts on and cannot handle a spoon.  She opened her mouth for the spoon but did not use her lips to secure the applesauce from the spoon.  She has a mildly delayed posterior transfer, but swallows with no overt clinical indicators of aspiration or oral residue.  She took 5-7 bites and then stated that she did not want more.  She was able to pull thin liquid via straw and swallow with no clinical indicators of aspiration.  She took approximately 2 ounces.  She was left upright in bed.  The patient is much more interactive with others and her environment and did not wiggle out of position today.  Recommend continuing NPO for now.  SLP will re-assess in the morning to insure that she did not reflux or vomit the small amount she consumed.    HPI HPI: Brianna Woods  is a 85 y.o. African-American female with a known history of multiple medical problems that are mentioned below, including asthma, coronary artery disease, fibromyalgia, hypertension, dyslipidemia, who presented to the emergency room with acute onset of altered mental status and abnormal labs at [facility].  She is ambulatory at baseline over the last few days she not been able to ambulate.  She is been having significantly diminished appetite and decreased p.o. intake.  She had labs drawn revealed hypernatremia that was up to 172 and hypokalemia as well as hyperchloremia.  She was also hypoglycemic with a blood glucose of 44 that  was up to 62 here on arrival.  No reported nausea or vomiting or diarrhea or abdominal pain.  No reported chest pain dyspnea or cough or wheezing.  The patient was nonverbal during my interview and fairly somnolent and therefore no history could be obtained from her.  She's been admitted for acute metabolic encephalopathy, hypothermia, and hypoglycemia.  She's been treated with antibiotics empirically for presumed infection, though no definitive evidence yet.  Her labs have shown vitamin b12 and folate deficiency, which are now being replaced.  Palliative care has been consulted due to her recent decline.  Today, the patient is more alert/oriented to others and her surroundings.      SLP Plan  Continue with current plan of care       Recommendations  Diet recommendations: NPO;Other(comment)(Trial POs w/ speech)                Oral Care Recommendations: Oral care BID;Staff/trained caregiver to provide oral care Follow up Recommendations: Skilled Nursing facility SLP Visit Diagnosis: Dysphagia, oropharyngeal phase (R13.12) Plan: Continue with current plan of care       GO               Leroy Sea, [place], Susie 11/28/2019, 3:45 PM

## 2019-11-29 DIAGNOSIS — E43 Unspecified severe protein-calorie malnutrition: Secondary | ICD-10-CM | POA: Insufficient documentation

## 2019-11-29 LAB — GLUCOSE, CAPILLARY
Glucose-Capillary: 91 mg/dL (ref 70–99)
Glucose-Capillary: 80 mg/dL (ref 70–99)
Glucose-Capillary: 67 mg/dL — ABNORMAL LOW (ref 70–99)
Glucose-Capillary: 77 mg/dL (ref 70–99)
Glucose-Capillary: 76 mg/dL (ref 70–99)
Glucose-Capillary: 96 mg/dL (ref 70–99)

## 2019-11-29 LAB — CBC WITH DIFFERENTIAL/PLATELET
Abs Immature Granulocytes: 0.08 10*3/uL — ABNORMAL HIGH (ref 0.00–0.07)
Basophils Absolute: 0 10*3/uL (ref 0.0–0.1)
Basophils Relative: 0 %
Eosinophils Absolute: 0.2 10*3/uL (ref 0.0–0.5)
Eosinophils Relative: 2 %
HCT: 24.4 % — ABNORMAL LOW (ref 36.0–46.0)
Hemoglobin: 7.8 g/dL — ABNORMAL LOW (ref 12.0–15.0)
Immature Granulocytes: 1 %
Lymphocytes Relative: 15 %
Lymphs Abs: 1.6 10*3/uL (ref 0.7–4.0)
MCH: 31.5 pg (ref 26.0–34.0)
MCHC: 32 g/dL (ref 30.0–36.0)
MCV: 98.4 fL (ref 80.0–100.0)
Monocytes Absolute: 1.4 10*3/uL — ABNORMAL HIGH (ref 0.1–1.0)
Monocytes Relative: 13 %
Neutro Abs: 7.3 10*3/uL (ref 1.7–7.7)
Neutrophils Relative %: 69 %
Platelets: 87 10*3/uL — ABNORMAL LOW (ref 150–400)
RBC: 2.48 MIL/uL — ABNORMAL LOW (ref 3.87–5.11)
RDW: 17.6 % — ABNORMAL HIGH (ref 11.5–15.5)
WBC: 10.4 10*3/uL (ref 4.0–10.5)
nRBC: 0 % (ref 0.0–0.2)

## 2019-11-29 LAB — MAGNESIUM: Magnesium: 1.8 mg/dL (ref 1.7–2.4)

## 2019-11-29 LAB — BASIC METABOLIC PANEL
Anion gap: 5 (ref 5–15)
BUN: 11 mg/dL (ref 8–23)
CO2: 23 mmol/L (ref 22–32)
Calcium: 8.3 mg/dL — ABNORMAL LOW (ref 8.9–10.3)
Chloride: 113 mmol/L — ABNORMAL HIGH (ref 98–111)
Creatinine, Ser: 1.19 mg/dL — ABNORMAL HIGH (ref 0.44–1.00)
GFR calc Af Amer: 49 mL/min — ABNORMAL LOW (ref >60)
GFR calc non Af Amer: 42 mL/min — ABNORMAL LOW (ref >60)
Glucose, Bld: 82 mg/dL (ref 70–99)
Potassium: 4.7 mmol/L (ref 3.5–5.1)
Sodium: 141 mmol/L (ref 135–145)

## 2019-11-29 LAB — LIPASE, BLOOD: Lipase: 19 U/L (ref 11–51)

## 2019-11-29 LAB — PHOSPHORUS: Phosphorus: 2.3 mg/dL — ABNORMAL LOW (ref 2.5–4.6)

## 2019-11-29 MED ORDER — LEVOTHYROXINE SODIUM 25 MCG PO TABS
25.0000 ug | ORAL_TABLET | Freq: Every day | ORAL | Status: DC
  Administered 2019-11-30 – 2019-12-01 (×2): 25 ug via ORAL
  Filled 2019-11-29 (×2): qty 1

## 2019-11-29 MED ORDER — KETOROLAC TROMETHAMINE 15 MG/ML IJ SOLN
15.0000 mg | Freq: Three times a day (TID) | INTRAMUSCULAR | Status: AC | PRN
  Administered 2019-11-29 – 2019-11-30 (×2): 15 mg via INTRAVENOUS
  Filled 2019-11-29 (×2): qty 1

## 2019-11-29 MED ORDER — DEXTROSE-NACL 5-0.45 % IV SOLN: INTRAVENOUS | Status: DC

## 2019-11-29 MED ORDER — CHOLECALCIFEROL 10 MCG (400 UNIT) PO TABS
400.0000 [IU] | ORAL_TABLET | Freq: Every day | ORAL | Status: DC
  Administered 2019-11-30 – 2019-12-01 (×2): 400 [IU] via ORAL
  Filled 2019-11-29 (×3): qty 1

## 2019-11-29 NOTE — Progress Notes (Signed)
  Speech Language Pathology Treatment: Dysphagia  Patient Details Name: Brianna Woods MRN: [id number] DOB: 1935-01-09 Today's Date: 11/29/2019 Time: 1100-1130 SLP Time Calculation (min) (ACUTE ONLY): 30 min  Assessment / Plan / Recommendation Clinical Impression  The patient was seen for ongoing assessment of swallowing and readiness for POs.  The patient was awake and verbally responsive.  She was found appropriately positioned in the bed and tolerated having the head of her bed elevated.  The patient has mitts on and cannot handle a spoon.  She opened her mouth for the spoon but did not use her lips to secure the applesauce from the spoon.  She has a mildly delayed posterior transfer, but swallows with no overt clinical indicators of aspiration or oral residue.  She took 5-7 bites and then stated that she did not want more.  She was able to pull thin liquid via straw and swallow with no clinical indicators of aspiration.  She took approximately 2 ounces.  She was left upright in bed.  The patient is much more interactive with others and her environment and did not wiggle out of position today.  Recommend initiating Dysphagia 1 diet with thin liquid.  The patient is exhibiting signs of esophageal dysphagia- belching and regurgitation.  Recommend strict reflux precautions in addition to aspiration precautions.    HPI HPI: Brianna Woods  is a 85 y.o. African-American female with a known history of multiple medical problems that are mentioned below, including asthma, coronary artery disease, fibromyalgia, hypertension, dyslipidemia, who presented to the emergency room with acute onset of altered mental status and abnormal labs at [facility].  She is ambulatory at baseline over the last few days she not been able to ambulate.  She is been having significantly diminished appetite and decreased p.o. intake.  She had labs drawn revealed hypernatremia that was up to 172 and hypokalemia as  well as hyperchloremia.  She was also hypoglycemic with a blood glucose of 44 that was up to 62 here on arrival.  No reported nausea or vomiting or diarrhea or abdominal pain.  No reported chest pain dyspnea or cough or wheezing.  The patient was nonverbal during my interview and fairly somnolent and therefore no history could be obtained from her.  She's been admitted for acute metabolic encephalopathy, hypothermia, and hypoglycemia.  She's been treated with antibiotics empirically for presumed infection, though no definitive evidence yet.  Her labs have shown vitamin b12 and folate deficiency, which are now being replaced.  Palliative care has been consulted due to her recent decline.  Today, the patient is more alert/oriented to others and her surroundings.      SLP Plan  Continue with current plan of care       Recommendations  Diet recommendations: Dysphagia 1 (puree);Thin liquid Liquids provided via: Cup;Straw Medication Administration: Crushed with puree Supervision: Full supervision/cueing for compensatory strategies Compensations: Minimize environmental distractions;Slow rate;Small sips/bites;Other (Comment)(REFLUX PRECAUTIONS)                Oral Care Recommendations: Oral care BID;Staff/trained caregiver to provide oral care Follow up Recommendations: Skilled Nursing facility SLP Visit Diagnosis: Dysphagia, oropharyngeal phase (R13.12) Plan: Continue with current plan of care       GO               Leroy Sea, MS/CCC- SLP  Valetta Fuller, Daine Floras 11/29/2019, 1:33 PM

## 2019-11-29 NOTE — Progress Notes (Signed)
PROGRESS NOTE    Brianna Woods  [id number] DOB: 01-24-1935 DOA: 11/22/2019 PCP: Ria Bush, MD    Brief Narrative:  JoettaFordis a84 y.o.African-American femalewith a known history of multiple medical problems that are mentioned below, including asthma, coronary artery disease, fibromyalgia, hypertension, dyslipidemia, who presented to the emergency room with acute onset ofaltered mental status and abnormal labs at [facility]. She is ambulatory at baseline over the last few days she not been able to ambulate. She is been having significantly diminished appetite and decreased p.o. intake. She had labs drawn revealed hypernatremia that was up to 172 and hypokalemia as well as hyperchloremia. She was also hypoglycemic with a blood glucose of 44 that was up to 62 here on arrival. No reported nausea or vomiting or diarrhea or abdominal pain. No reported chest pain dyspnea or cough or wheezing. The patient was nonverbal during my interview and fairly somnolent and therefore no history could be obtained from her.  She's been admitted for acute metabolic encephalopathy, hypothermia, and hypoglycemia.  She's been treated with antibiotics empirically for presumed infection, though no definitive evidence yet.  Her labs have shown vitamin b12 and folate deficiency, which are now being replaced.  Palliative care has been consulted due to her recent decline.   3/20: Patient seen and examined.  No interval status changes.  Verbally communicative but not directable.  No source of infection identified.  Hypothermia resolved after initiation of antibiotics.  Palliative care on consult.   Assessment & Plan:   Active Problems:   Goals of care, counseling/discussion   Altered mental status   Encephalopathy   Hypernatremia   Palliative care by specialist   Hypokalemia   Hypoglycemia   Adult failure to thrive   Protein-calorie malnutrition, severe  Goals of  Care:  Patient seems to have been declining over the past several weeks.  Moved in with son b/c not doing well independently, found down a few times.  She lived with son, but declined with him as well, falling asleep in conversations, etc.  [facility]  Updated that she wasn't eating or drinking, not acting herself, abnormal labs and transferred here. Discussed code status with son, [name] who noted that he thinks she would want to be DNR Will c/s palliative care as it seems she's failing to thrive - appreciate assistance\ Plan Per last palliative care note was consideration for discharge to skilled nursing facility with hospice backup.  3/20: Discussed overall plan of care with patient's son.  He indicates that he would like to bring patient back to [facility] with hospice services.  I feel this is an appropriate disposition.  Patient will need to demonstrate least 24hours fever free and without hypothermia and then will likely be stable for discharge back [facility]   SIRS  Hypothermia  Hypotension  AMS  R/o Sepsis  - intermittent hypothermia, last documented 11/28/2019 at 9:48 AM - AM cortisol (18.1), TSH 6.814 with normal free T4 - s/p empiric IV abx cefepime x 5 days, d/c'd on 3/17 as all w/up negative so far  - UA does not appear c/w UTI, CXR without acute abnormality - follow blood and urine cultures NG - MRSA pcr negative --3/19 still low Temp, so empiric antibiotics started again, Levaquin 75 mg IV daily and Flagyl 500 mg every 8 hourly -3/19 extensive work-up procalcitonin negative, follow repeat blood culture, urine culture, CT chest and abdomen to rule out any infection --Possible pancreatitis noted on CT.  No other obv source of infection Plan: Continue to monitor off abx Low threshold to restart should patient develop clinical signs of sepsis/infection Follow up serum lipase level  Acute metabolic encephalopathy - her encephalopathy is persistent, but slightly improved  -> she's still significantly disoriented, unable to communicate clearly to me this morning, incomprehensible speech - TSH as above, with vitamin b12 deficiency and folate deficiency,  - high dose thiamine - delirium precautions - prolonged qt on EKG -> f/u repeat EKG  - ativan for agitation for now Patient not acutely agitated  Hypothermia, improved - cortisol 18.1, TSH 6.814 with normal free T4 - improved after bair hugger  Hypoglycemia. - s/p D10 w/K 10 IVF, change IV fluid on 3/19 D5 w/K 100 ml/h Plan: Change fluids to D5 half NS at 100cc/hr - Q4 POC BG checks  Hypernatremia  Hyperchloremia, improved 2/2 dehydration, (with hyperchloremia, ? Related to NS administration as well?) -  Sodium level improving Currently on D5 half NS, will continue  Hypophosphatemia replace and follow  Hypokalemia:  Replace and follow   Hypothyroidism TSH 6.8, could be due to noncompliance/decreased oral intake Apparently patient has a history of Graves disease Plan: Resume home PO synthroid 25 mcg daily Outpatient followup  Hypertension. Home meds held due to NPO status Diet advanced per SLP Patient on cozaar and toprol xl PTA Plan: Continue metoprolol XL 25 BID per home dose Hold Cozaar for now, consider restarting tomorrow DC clonidine patch IV hydralazine PRN  GERD. On Pepcid  Peripheral neuropathy. Gabapentin  Dyslipidemia. Home statin  Anemia  Thrombocytopenia  thrombocytopenia is new, unclear etiology No bleeding B12 and folate def likely contributing Plan: continue to monitor.   Vit D supplmentation  Overall poor prognosis, palliative care is following   DVT prophylaxis: SCD  Code Status: DNR Family Communication: Son via phone 11/29/2019 Disposition Plan: Anticipate discharge back to [facility] with hospice backup.  Barriers to discharge.  Patient still with possibility of occult infection.  Will need a least 24 hours fever hypothermia free  prior to discharge.  She also has very poor p.o. intake.  Diet was advanced to dysphagia 1 per SLP today.  Patient will need to demonstrate some p.o. intake without need for continuous dextrose containing IV fluids.  Anticipate discharge on 12/01/2019.  Consultants:   none  Procedures:   none  Antimicrobials:   none   Subjective: Seen and examined Communicative, but remains altered Unable to provide history  Objective: Vitals:   11/28/19 2039 11/28/19 2040 11/29/19 0055 11/29/19 0130  BP: (!) 104/39  (!) 86/41 (!) 108/53  Pulse: (!) 152 98 90 75  Resp:   17   Temp:   (!) 97.5 F (36.4 C)   TempSrc:   Oral   SpO2:  98% 97%   Weight:      Height:        Intake/Output Summary (Last 24 hours) at 11/29/2019 1333 Last data filed at 11/29/2019 0500 Gross per 24 hour  Intake 2448.74 ml  Output 125 ml  Net 2323.74 ml   Filed Weights   11/22/19 2157 11/22/19 2251  Weight: 62.6 kg 62.6 kg    Examination:  General exam: Appears calm.  No apparent distress.  Appears stated age.  Appears chronically ill Respiratory system: Clear to auscultation. Respiratory effort normal. Cardiovascular system: S1-S2 heard, RRR, no murmurs, no pedal edema gastrointestinal system: NT/ND, positive bowel sounds Central nervous system: Alert.  Oriented x1.  No focal deficits Extremities: Symmetric 5 x  5 power. Skin: No rashes, lesions or ulcers Psychiatry: Confused.  Does not follow commands    Data Reviewed: I have personally reviewed following labs and imaging studies  CBC: Recent Labs  Lab 11/22/19 2220 11/23/19 0614 11/24/19 0612 11/24/19 0612 11/25/19 0710 11/26/19 0404 11/27/19 0235 11/28/19 [id number] 11/29/19 0558  WBC 5.2   < > 7.8   < > 8.1 8.7 10.0 13.0* 10.4  NEUTROABS 4.0  --  5.0  --  4.9 5.2  --   --  7.3  HGB 9.7*   < > 9.4*   < > 9.8* 9.8* 10.2* 10.0* 7.8*  HCT 32.9*   < > 31.3*   < > 30.7* 31.2* 31.4* 31.2* 24.4*  MCV 104.8*   < > 102.0*   < > 98.4 96.9 96.0 97.5  98.4  PLT 75*   < > 59*   < > 63* 70* 98* 97* 87*   < > = values in this interval not displayed.   Basic Metabolic Panel: Recent Labs  Lab 11/25/19 0710 11/26/19 0404 11/27/19 0235 11/28/19 0633 11/29/19 0558  NA 154* 152* 147* 145 141  K 3.7 3.4* 3.4* 3.2* 4.7  CL 121* 119* 114* 112* 113*  CO2 24 31 27 27 23   GLUCOSE 86 101* 145* 131* 82  BUN 13 11 10 8 11   CREATININE 0.78 0.71 0.84 0.74 1.19*  CALCIUM 8.8* 8.7* 8.7* 8.5* 8.3*  MG 2.2 1.8 1.6* 2.2 1.8  PHOS 2.2* 2.0* 4.2 3.6 2.3*   GFR: Estimated Creatinine Clearance: 31.7 mL/min (A) (by C-G formula based on SCr of 1.19 mg/dL (H)). Liver Function Tests: Recent Labs  Lab 11/22/19 2220 11/24/19 0612 11/25/19 0710 11/26/19 0404  AST 25 28 25 25   ALT 23 24 23 25   ALKPHOS 69 60 64 63  BILITOT 1.2 0.8 0.9 0.9  PROT 5.4* 5.3* 5.2* 5.2*  ALBUMIN 3.1* 2.8* 2.7* 2.8*   No results for input(s): LIPASE, AMYLASE in the last 168 hours. No results for input(s): AMMONIA in the last 168 hours. Coagulation Profile: Recent Labs  Lab 11/22/19 2220 11/23/19 0614  INR 1.1 1.1   Cardiac Enzymes: No results for input(s): CKTOTAL, CKMB, CKMBINDEX, TROPONINI in the last 168 hours. BNP (last 3 results) No results for input(s): PROBNP in the last 8760 hours. HbA1C: No results for input(s): HGBA1C in the last 72 hours. CBG: Recent Labs  Lab 11/28/19 2002 11/29/19 0518 11/29/19 0904 11/29/19 1212 11/29/19 1311  GLUCAP 79 91 80 67* 77   Lipid Profile: No results for input(s): CHOL, HDL, LDLCALC, TRIG, CHOLHDL, LDLDIRECT in the last 72 hours. Thyroid Function Tests: No results for input(s): TSH, T4TOTAL, FREET4, T3FREE, THYROIDAB in the last 72 hours. Anemia Panel: No results for input(s): VITAMINB12, FOLATE, FERRITIN, TIBC, IRON, RETICCTPCT in the last 72 hours. Sepsis Labs: Recent Labs  Lab 11/22/19 2220 11/23/19 0614 11/28/19 0911  PROCALCITON <0.10 <0.10 <0.10  LATICACIDVEN 1.2  --   --     Recent Results (from  the past 240 hour(s))  Urine culture     Status: None   Collection Time: 11/22/19 10:20 PM   Specimen: Urine, Catheterized  Result Value Ref Range Status   Specimen Description   Final    URINE, CATHETERIZED Performed at [facility], [address], [place], [place] [address]    Special Requests   Final    NONE Performed at [facility], [address], [place], [place] [address]    Culture  Final    NO GROWTH Performed at [facility], [place] [address] [place], [place] [address]    Report Status 11/24/2019 FINAL  Final  Blood culture (routine x 2)     Status: None   Collection Time: 11/22/19 10:20 PM   Specimen: BLOOD  Result Value Ref Range Status   Specimen Description BLOOD Blood Culture adequate volume  Final   Special Requests   Final    BOTTLES DRAWN AEROBIC AND ANAEROBIC LEFT ANTECUBITAL   Culture   Final    NO GROWTH 5 DAYS Performed at [facility], [address] [place], [place] [address]    Report Status 11/27/2019 FINAL  Final  Blood culture (routine x 2)     Status: None   Collection Time: 11/22/19 10:20 PM   Specimen: BLOOD  Result Value Ref Range Status   Specimen Description BLOOD LEFT WRIST  Final   Special Requests   Final    BOTTLES DRAWN AEROBIC AND ANAEROBIC Blood Culture adequate volume   Culture   Final    NO GROWTH 5 DAYS Performed at [facility], [address] [place], [place] [address]    Report Status 11/27/2019 FINAL  Final  Respiratory Panel by RT PCR (Flu A&B, Covid) - Nasopharyngeal Swab     Status: None   Collection Time: 11/23/19 12:41 AM   Specimen: Nasopharyngeal Swab  Result Value Ref Range Status   SARS Coronavirus 2 by RT PCR NEGATIVE NEGATIVE Final    Comment: (NOTE) SARS-CoV-2 target nucleic acids are NOT DETECTED. The SARS-CoV-2 RNA is generally detectable in upper respiratoy specimens during the acute phase of infection. The lowest concentration of SARS-CoV-2 viral copies  this assay can detect is 131 copies/mL. A negative result does not preclude SARS-Cov-2 infection and should not be used as the sole basis for treatment or other patient management decisions. A negative result may occur with  improper specimen collection/handling, submission of specimen other than nasopharyngeal swab, presence of viral mutation(s) within the areas targeted by this assay, and inadequate number of viral copies (<131 copies/mL). A negative result must be combined with clinical observations, patient history, and epidemiological information. The expected result is Negative. Fact Sheet for Patients:  PinkCheek.be Fact Sheet for Healthcare Providers:  GravelBags.it This test is not yet ap proved or cleared by the [place] FDA and  has been authorized for detection and/or diagnosis of SARS-CoV-2 by FDA under an Emergency Use Authorization (EUA). This EUA will remain  in effect (meaning this test can be used) for the duration of the COVID-19 declaration under Section 564(b)(1) of the Act, 21 U.S.C. section 360bbb-3(b)(1), unless the authorization is terminated or revoked sooner.    Influenza A by PCR NEGATIVE NEGATIVE Final   Influenza B by PCR NEGATIVE NEGATIVE Final    Comment: (NOTE) The Xpert Xpress SARS-CoV-2/FLU/RSV assay is intended as an aid in  the diagnosis of influenza from Nasopharyngeal swab specimens and  should not be used as a sole basis for treatment. Nasal washings and  aspirates are unacceptable for Xpert Xpress SARS-CoV-2/FLU/RSV  testing. Fact Sheet for Patients: PinkCheek.be Fact Sheet for Healthcare Providers: GravelBags.it This test is not yet approved or cleared by the [place] FDA and  has been authorized for detection and/or diagnosis of SARS-CoV-2 by  FDA under an Emergency Use Authorization (EUA). This EUA will remain  in  effect (meaning this test can be used) for the duration of the  Covid-19 declaration under Section  564(b)(1) of the Act, 21  U.S.C. section 360bbb-3(b)(1), unless the authorization is  terminated or revoked. Performed at [facility], [address] [place], [place] [address]   MRSA PCR Screening     Status: None   Collection Time: 11/25/19  5:36 AM   Specimen: Nasopharyngeal  Result Value Ref Range Status   MRSA by PCR NEGATIVE NEGATIVE Final    Comment:        The GeneXpert MRSA Assay (FDA approved for NASAL specimens only), is one component of a comprehensive MRSA colonization surveillance program. It is not intended to diagnose MRSA infection nor to guide or monitor treatment for MRSA infections. Performed at [facility], [address] [place], [place] [address]   CULTURE, BLOOD (ROUTINE X 2) w Reflex to ID Panel     Status: None (Preliminary result)   Collection Time: 11/28/19  9:10 AM   Specimen: BLOOD  Result Value Ref Range Status   Specimen Description BLOOD LEFT ANTECUBITAL  Final   Special Requests   Final    BOTTLES DRAWN AEROBIC AND ANAEROBIC Blood Culture results may not be optimal due to an excessive volume of blood received in culture bottles   Culture   Final    NO GROWTH < 24 HOURS Performed at [facility], [address], [place], [place] [address]    Report Status PENDING  Incomplete  CULTURE, BLOOD (ROUTINE X 2) w Reflex to ID Panel     Status: None (Preliminary result)   Collection Time: 11/28/19  9:16 AM   Specimen: BLOOD  Result Value Ref Range Status   Specimen Description BLOOD BLOOD LEFT FOREARM  Final   Special Requests   Final    BOTTLES DRAWN AEROBIC AND ANAEROBIC Blood Culture adequate volume   Culture   Final    NO GROWTH < 24 HOURS Performed at [facility], [address] [place], [place] [address]    Report Status PENDING  Incomplete         Radiology Studies: CT ABDOMEN PELVIS WO  CONTRAST  Result Date: 11/28/2019 CLINICAL DATA:  Failure to thrive, admitted with altered mental status and abnormal labs. Patient remains in cephalo pathic after several days admission. EXAM: CT CHEST, ABDOMEN AND PELVIS WITHOUT CONTRAST TECHNIQUE: Multidetector CT imaging of the chest, abdomen and pelvis was performed following the standard protocol without IV contrast. COMPARISON:  06/06/2018 FINDINGS: CT CHEST FINDINGS Cardiovascular: Main pulmonary artery 3.4 cm. Scattered aortic atherosclerosis. The left coronary circulation with calcified coronary artery disease. No pericardial effusion. Heart size is normal. Limited assessment of vascular structures in the chest due to lack of intravenous contrast. Mediastinum/Nodes: Left hemi thyroid is enlarged measuring 3.5 x 2.2 cm, previously referenced as enlarged on a thyroid ultrasound from 2014. Large hiatal hernia with much of the stomach herniated into the chest. Mild circumferential distal esophageal thickening. No adenopathy in the chest. Lungs/Pleura: Tiny pulmonary nodule in the right upper lobe measures 4 mm (image 21, series 4) calcified granuloma in the right middle lobe. Basilar atelectasis on the left. Small to moderate right effusion and trace left effusion also with right basilar atelectasis. Airways are patent. Musculoskeletal: Subacute and chronic right-sided rib fractures involving ribs 3 through 6 on the right anteriorly. No chest wall mass. CT ABDOMEN PELVIS FINDINGS Hepatobiliary: Low-density area in the medial segment of the left hepatic lobe along the fissure for falciform ligament is likely focal fat deposition. Post cholecystectomy with post cholecystectomy ductal distension that is  similar to the previous study. Pancreas: Peripancreatic stranding has developed since the previous exam with stranding about the pancreatic head and uncinate and pancreatic neck and proximal body. Spleen: Spleen is normal in size without focal lesion.  Adrenals/Urinary Tract: Adrenal glands are normal. Nonobstructing right upper pole calculus at 8 mm similar to the prior exam. No hydronephrosis. Low-density lesion arising from lateral left kidney likely a small cyst. Stomach/Bowel: Colonic diverticulosis. Bowel management tube in the rectum. No acute gastrointestinal process. Normal appendix. Large hiatal hernia with greater than 50% of the stomach herniated into the chest. Vascular/Lymphatic: Atheromatous plaque in the abdominal aorta without aneurysm. No adenopathy in the upper abdomen or in the retroperitoneum. No pelvic adenopathy. Reproductive: Ill-defined low-attenuation areas associated with the cervix are unchanged over a series of prior exams. Likely corresponding to nabothian cysts seen on previous sonogram. No adnexal mass. Other: No signs of hernia, free air or ascites. Musculoskeletal: Signs of subacute and/or chronic fractures involving anterior right-sided ribs. Spinal degenerative change no acute or destructive bone process. IMPRESSION: 1. Peripancreatic stranding indicative of acute interstitial pancreatitis. Correlate with pancreatic enzymes. 2. Post cholecystectomy biliary ductal dilation similar to imaging dating back to 2019. Correlate with biliary enzymes to determine whether this is a contributing facture to changes of pancreatitis. 3. Subacute and/or chronic right-sided rib fractures as described. 4. Bilateral pleural effusions and basilar atelectasis. 5. Right-sided nephrolithiasis with large calculus in the upper pole as before. 6. Large hiatal hernia with greater than 50% of the stomach herniated into the chest. 7. 4 mm pulmonary nodule right upper lobe. No follow-up needed if patient is low-risk. Non-contrast chest CT can be considered in 12 months if patient is high-risk. This recommendation follows the consensus statement: Guidelines for Management of Incidental Pulmonary Nodules Detected on CT Images: From the Fleischner Society  2017; Radiology 2017; 284:228-243. 8. Enlargement of the left hemi thyroid enlarged on previous thyroid ultrasound. Shown to have history of Graves disease. No follow-up recommended unless clinically warranted based on previous imaging and workup.(Ref: J Am Coll Radiol. 2015 Feb;12(2): 143-50). Aortic Atherosclerosis (ICD10-I70.0). Electronically Signed   By: Zetta Bills M.D.   On: 11/28/2019 11:25   CT CHEST WO CONTRAST  Result Date: 11/28/2019 CLINICAL DATA:  Failure to thrive, admitted with altered mental status and abnormal labs. Patient remains in cephalo pathic after several days admission. EXAM: CT CHEST, ABDOMEN AND PELVIS WITHOUT CONTRAST TECHNIQUE: Multidetector CT imaging of the chest, abdomen and pelvis was performed following the standard protocol without IV contrast. COMPARISON:  06/06/2018 FINDINGS: CT CHEST FINDINGS Cardiovascular: Main pulmonary artery 3.4 cm. Scattered aortic atherosclerosis. The left coronary circulation with calcified coronary artery disease. No pericardial effusion. Heart size is normal. Limited assessment of vascular structures in the chest due to lack of intravenous contrast. Mediastinum/Nodes: Left hemi thyroid is enlarged measuring 3.5 x 2.2 cm, previously referenced as enlarged on a thyroid ultrasound from 2014. Large hiatal hernia with much of the stomach herniated into the chest. Mild circumferential distal esophageal thickening. No adenopathy in the chest. Lungs/Pleura: Tiny pulmonary nodule in the right upper lobe measures 4 mm (image 21, series 4) calcified granuloma in the right middle lobe. Basilar atelectasis on the left. Small to moderate right effusion and trace left effusion also with right basilar atelectasis. Airways are patent. Musculoskeletal: Subacute and chronic right-sided rib fractures involving ribs 3 through 6 on the right anteriorly. No chest wall mass. CT ABDOMEN PELVIS FINDINGS Hepatobiliary: Low-density area in the medial segment of the left  hepatic lobe along the fissure for falciform ligament is likely focal fat deposition. Post cholecystectomy with post cholecystectomy ductal distension that is similar to the previous study. Pancreas: Peripancreatic stranding has developed since the previous exam with stranding about the pancreatic head and uncinate and pancreatic neck and proximal body. Spleen: Spleen is normal in size without focal lesion. Adrenals/Urinary Tract: Adrenal glands are normal. Nonobstructing right upper pole calculus at 8 mm similar to the prior exam. No hydronephrosis. Low-density lesion arising from lateral left kidney likely a small cyst. Stomach/Bowel: Colonic diverticulosis. Bowel management tube in the rectum. No acute gastrointestinal process. Normal appendix. Large hiatal hernia with greater than 50% of the stomach herniated into the chest. Vascular/Lymphatic: Atheromatous plaque in the abdominal aorta without aneurysm. No adenopathy in the upper abdomen or in the retroperitoneum. No pelvic adenopathy. Reproductive: Ill-defined low-attenuation areas associated with the cervix are unchanged over a series of prior exams. Likely corresponding to nabothian cysts seen on previous sonogram. No adnexal mass. Other: No signs of hernia, free air or ascites. Musculoskeletal: Signs of subacute and/or chronic fractures involving anterior right-sided ribs. Spinal degenerative change no acute or destructive bone process. IMPRESSION: 1. Peripancreatic stranding indicative of acute interstitial pancreatitis. Correlate with pancreatic enzymes. 2. Post cholecystectomy biliary ductal dilation similar to imaging dating back to 2019. Correlate with biliary enzymes to determine whether this is a contributing facture to changes of pancreatitis. 3. Subacute and/or chronic right-sided rib fractures as described. 4. Bilateral pleural effusions and basilar atelectasis. 5. Right-sided nephrolithiasis with large calculus in the upper pole as before. 6.  Large hiatal hernia with greater than 50% of the stomach herniated into the chest. 7. 4 mm pulmonary nodule right upper lobe. No follow-up needed if patient is low-risk. Non-contrast chest CT can be considered in 12 months if patient is high-risk. This recommendation follows the consensus statement: Guidelines for Management of Incidental Pulmonary Nodules Detected on CT Images: From the Fleischner Society 2017; Radiology 2017; 284:228-243. 8. Enlargement of the left hemi thyroid enlarged on previous thyroid ultrasound. Shown to have history of Graves disease. No follow-up recommended unless clinically warranted based on previous imaging and workup.(Ref: J Am Coll Radiol. 2015 Feb;12(2): 143-50). Aortic Atherosclerosis (ICD10-I70.0). Electronically Signed   By: Zetta Bills M.D.   On: 11/28/2019 11:25        Scheduled Meds: . vitamin C  500 mg Oral Daily  . atorvastatin  40 mg Oral Q M,W,F  . Chlorhexidine Gluconate Cloth  6 each Topical Daily  . cloNIDine  0.3 mg Transdermal Weekly  . cyanocobalamin  1,000 mcg Intramuscular Daily  . divalproex  250 mg Oral BID  . famotidine  40 mg Oral QHS  . gabapentin  300 mg Oral QHS  . levothyroxine  12.5 mcg Intravenous q AM  . metoprolol succinate  25 mg Oral BID  . traZODone  50 mg Oral QHS   Continuous Infusions: . dextrose 5 % and 0.45% NaCl 75 mL/hr at 11/29/19 1246  . folic acid (FOLVITE) IVPB Stopped (11/28/19 1611)  . [START ON 11/30/2019] levofloxacin (LEVAQUIN) IV    . metronidazole 500 mg (11/29/19 1249)  . thiamine injection Stopped (11/28/19 2049)     LOS: 6 days    Time spent: 35 minutes    Sidney Ace, MD [facility] Pager [phone]  If 7PM-7AM, please contact night-coverage  11/29/2019, 1:33 PM

## 2019-11-29 NOTE — Progress Notes (Signed)
PT Cancellation Note  Patient Details Name: Brianna Woods MRN: [id number] DOB: 1935-09-11   Cancelled Treatment:    Reason Eval/Treat Not Completed: Patient declined, no reason specified; Patient is confused and wants to wait until she is not "disabled". She wont allow the PT to adjust her position in bed or move her covers.    Alanson Puls , [place] DPT 11/29/2019, 9:21 AM

## 2019-11-30 LAB — BASIC METABOLIC PANEL
Anion gap: 8 (ref 5–15)
BUN: 14 mg/dL (ref 8–23)
CO2: 23 mmol/L (ref 22–32)
Calcium: 8.6 mg/dL — ABNORMAL LOW (ref 8.9–10.3)
Chloride: 112 mmol/L — ABNORMAL HIGH (ref 98–111)
Creatinine, Ser: 1.06 mg/dL — ABNORMAL HIGH (ref 0.44–1.00)
GFR calc Af Amer: 57 mL/min — ABNORMAL LOW (ref >60)
GFR calc non Af Amer: 49 mL/min — ABNORMAL LOW (ref >60)
Glucose, Bld: 95 mg/dL (ref 70–99)
Potassium: 4.5 mmol/L (ref 3.5–5.1)
Sodium: 143 mmol/L (ref 135–145)

## 2019-11-30 LAB — CBC WITH DIFFERENTIAL/PLATELET
Abs Immature Granulocytes: 0.04 10*3/uL (ref 0.00–0.07)
Basophils Absolute: 0 10*3/uL (ref 0.0–0.1)
Basophils Relative: 0 %
Eosinophils Absolute: 0.1 10*3/uL (ref 0.0–0.5)
Eosinophils Relative: 1 %
HCT: 25.4 % — ABNORMAL LOW (ref 36.0–46.0)
Hemoglobin: 7.9 g/dL — ABNORMAL LOW (ref 12.0–15.0)
Immature Granulocytes: 0 %
Lymphocytes Relative: 16 %
Lymphs Abs: 1.8 10*3/uL (ref 0.7–4.0)
MCH: 31.2 pg (ref 26.0–34.0)
MCHC: 31.1 g/dL (ref 30.0–36.0)
MCV: 100.4 fL — ABNORMAL HIGH (ref 80.0–100.0)
Monocytes Absolute: 1.5 10*3/uL — ABNORMAL HIGH (ref 0.1–1.0)
Monocytes Relative: 13 %
Neutro Abs: 7.7 10*3/uL (ref 1.7–7.7)
Neutrophils Relative %: 70 %
Platelets: 88 10*3/uL — ABNORMAL LOW (ref 150–400)
RBC: 2.53 MIL/uL — ABNORMAL LOW (ref 3.87–5.11)
RDW: 17.5 % — ABNORMAL HIGH (ref 11.5–15.5)
WBC: 11.1 10*3/uL — ABNORMAL HIGH (ref 4.0–10.5)
nRBC: 0 % (ref 0.0–0.2)

## 2019-11-30 LAB — MAGNESIUM: Magnesium: 1.8 mg/dL (ref 1.7–2.4)

## 2019-11-30 LAB — GLUCOSE, CAPILLARY
Glucose-Capillary: 101 mg/dL — ABNORMAL HIGH (ref 70–99)
Glucose-Capillary: 76 mg/dL (ref 70–99)
Glucose-Capillary: 79 mg/dL (ref 70–99)
Glucose-Capillary: 83 mg/dL (ref 70–99)
Glucose-Capillary: 84 mg/dL (ref 70–99)
Glucose-Capillary: 89 mg/dL (ref 70–99)

## 2019-11-30 LAB — PHOSPHORUS: Phosphorus: 3.3 mg/dL (ref 2.5–4.6)

## 2019-11-30 LAB — URINE CULTURE: Culture: NO GROWTH

## 2019-11-30 MED ORDER — METOPROLOL TARTRATE 25 MG PO TABS
25.0000 mg | ORAL_TABLET | Freq: Two times a day (BID) | ORAL | Status: DC
  Administered 2019-11-30 – 2019-12-01 (×2): 25 mg via ORAL
  Filled 2019-11-30 (×2): qty 1

## 2019-11-30 MED ORDER — LEVOFLOXACIN 750 MG PO TABS
750.0000 mg | ORAL_TABLET | ORAL | Status: DC
  Filled 2019-11-30: qty 1

## 2019-11-30 MED ORDER — FOLIC ACID 1 MG PO TABS
1.0000 mg | ORAL_TABLET | Freq: Every day | ORAL | Status: DC
  Administered 2019-12-01: 09:00:00 1 mg via ORAL
  Filled 2019-11-30: qty 1

## 2019-11-30 NOTE — Progress Notes (Signed)
PHARMACIST - PHYSICIAN COMMUNICATION  CONCERNING: IV to Oral Route Change Policy  RECOMMENDATION: This patient is receiving folic acid by the intravenous route.  Based on criteria approved by the Pharmacy and Therapeutics Committee, the intravenous medication(s) is/are being converted to the equivalent oral dose form(s).   DESCRIPTION: These criteria include:  The patient is eating (either orally or via tube) and/or has been taking other orally administered medications for a least 24 hours  The patient has no evidence of active gastrointestinal bleeding or impaired GI absorption (gastrectomy, short bowel, patient on TNA or NPO).  If you have questions about this conversion, please contact the Pharmacy Department at [phone].  Brianna Woods L, [facility] 11/30/2019 3:44 PM

## 2019-11-30 NOTE — Progress Notes (Signed)
PHARMACY NOTE:  ANTIMICROBIAL RENAL DOSAGE ADJUSTMENT  Current antimicrobial regimen includes a mismatch between antimicrobial dosage and estimated renal function.  As per policy approved by the Pharmacy & Therapeutics and Medical Executive Committees, the antimicrobial dosage will be adjusted accordingly.  Current antimicrobial dosage:  Levofloxacin 750mg  Daily   Renal Function:  Estimated Creatinine Clearance: 35.6 mL/min (A) (by C-G formula based on SCr of 1.06 mg/dL (H)).     Antimicrobial dosage has been changed to:  Levofloxacin 750mg  Q48hr.   Additional comments:   Thank you for allowing pharmacy to be a part of this patient's care.  Ashland Wiseman L, [facility] 11/30/2019 10:11 AM

## 2019-11-30 NOTE — Progress Notes (Signed)
PROGRESS NOTE    Maddix Cotherman  [id number] DOB: [birth year] DOA: 11/22/2019 PCP: Ria Bush, MD    Brief Narrative:  JoettaFordis a84 y.o.African-American femalewith a known history of multiple medical problems that are mentioned below, including asthma, coronary artery disease, fibromyalgia, hypertension, dyslipidemia, who presented to the emergency room with acute onset ofaltered mental status and abnormal labs at [facility]. She is ambulatory at baseline over the last few days she not been able to ambulate. She is been having significantly diminished appetite and decreased p.o. intake. She had labs drawn revealed hypernatremia that was up to 172 and hypokalemia as well as hyperchloremia. She was also hypoglycemic with a blood glucose of 44 that was up to 62 here on arrival. No reported nausea or vomiting or diarrhea or abdominal pain. No reported chest pain dyspnea or cough or wheezing. The patient was nonverbal during my interview and fairly somnolent and therefore no history could be obtained from her.  She's been admitted for acute metabolic encephalopathy, hypothermia, and hypoglycemia.  She's been treated with antibiotics empirically for presumed infection, though no definitive evidence yet.  Her labs have shown vitamin b12 and folate deficiency, which are now being replaced.  Palliative care has been consulted due to her recent decline.   3/20: Patient seen and examined.  No interval status changes.  Verbally communicative but not directable.  No source of infection identified.  Hypothermia resolved after initiation of antibiotics.  Palliative care on consult.  3/21: Patient seen and examined.  No clinical changes over interval.  Not very communicative this morning.  Intermittently yelling out.  As yet no source of infection identified.  No hypothermia noted over interval.  Discussed overall status with patient's son yesterday.  Current  discharge plan is to dispo to skilled nursing facility with hospice services backup.   Assessment & Plan:   Active Problems:   Goals of care, counseling/discussion   Altered mental status   Encephalopathy   Hypernatremia   Palliative care by specialist   Hypokalemia   Hypoglycemia   Adult failure to thrive   Protein-calorie malnutrition, severe  Goals of Care:  Patient seems to have been declining over the past several weeks.  Moved in with son b/c not doing well independently, found down a few times.  She lived with son, but declined with him as well, falling asleep in conversations, etc.  [facility]  Updated that she wasn't eating or drinking, not acting herself, abnormal labs and transferred here. Discussed code status with son, [name] who noted that he thinks she would want to be DNR Will c/s palliative care as it seems she's failing to thrive - appreciate assistance Plan Per last palliative care note was consideration for discharge to skilled nursing facility with hospice backup.  3/20: Discussed overall plan of care with patient's son.  He indicates that he would like to bring patient back to [facility] with hospice services.  I feel this is an appropriate disposition.  Patient will need to demonstrate least 24hours fever free and without hypothermia and then will likely be stable for discharge back to [facility]  3/21: Patient seems to have waxing and waning mental status.  This morning she is unable to hold a conversation.  Intermittently yelling out.  SIRS  Hypothermia  Hypotension  AMS  R/o Sepsis  - intermittent hypothermia, last documented 11/28/2019 at 9:48 AM - AM cortisol (18.1), TSH 6.814 with normal free T4 - s/p empiric IV abx cefepime  x 5 days, d/c'd on 3/17 as all w/up negative so far  - UA does not appear c/w UTI, CXR without acute abnormality - follow blood and urine cultures NG - MRSA pcr negative --3/19 still low Temp, so empiric antibiotics started again,  Levaquin 75 mg IV daily and Flagyl 500 mg every 8 hourly -3/19 extensive work-up procalcitonin negative, follow repeat blood culture, urine culture, CT chest and abdomen to rule out any infection --Possible pancreatitis noted on CT.   No other obv source of infection.  Serum lipase normal.  Not indicative of acute pancreatitis Plan: Continue empiric Levaquin, dose changed to p.o. Ceftin 50 mg every 48 hours Low threshold to restart broad-spectrum antibiotics should patient develop any obvious clinical signs of sepsis or infection DC IV fluids Patient may be stable for discharge to skilled nursing facility with hospice backup on 12/01/2019.  Likely would benefit from palliative care reevaluation on 12/01/2019.  No clear source of infection has been identified however we still do not have a clear etiology for hypothermia and hypotension noted.  Last episode of this was on 11/28/2019.  Acute metabolic encephalopathy - her encephalopathy is persistent -> she's still significantly disoriented, unable to communicate clearly to me this morning, incomprehensible speech -May be indicative of worsening of underlying cognitive decline -Has not really improved over the last 48 hours - TSH as above, with vitamin b12 deficiency and folate deficiency,  - high dose thiamine - delirium precautions -Avoid benzos as possible -Nightly as needed Ativan on board, it was a home medication  Hypothermia, improved - cortisol 18.1, TSH 6.814 with normal free T4 - improved after bair hugger  Hypoglycemia. - s/p D10 w/K 10 IVF, change IV fluid on 3/19 D5 w/K 100 ml/h D5 half NS at 100 cc/h started on 11/29/2019 Plan: DC all IV fluids Continue with every 4 hours CBGs If patient able to maintain euglycemia off intravenous fluids she may be stable for discharge to skilled nursing facility on 12/01/2019  Hypernatremia  Hyperchloremia, improved 2/2 dehydration, (with hyperchloremia, ? Related to NS administration as  well?) -  Sodium level improving Currently on D5 half NS, will continue  Hypophosphatemia replace and follow  Hypokalemia:  Replace and follow   Hypothyroidism TSH 6.8, could be due to noncompliance/decreased oral intake Apparently patient has a history of Graves disease Plan: Resume home PO synthroid 25 mcg daily Outpatient followup  Hypertension. Home meds held due to NPO status Diet advanced per SLP Patient on cozaar and toprol xl PTA Plan: Continue metoprolol XL 25 BID per home dose Hold Cozaar for now, consider restarting tomorrow.  At this time blood pressure does not allow for addition of another antihypertensive agent IV hydralazine PRN  GERD. On Pepcid  Peripheral neuropathy. Gabapentin  Dyslipidemia. Home statin  Anemia  Thrombocytopenia  thrombocytopenia is new, unclear etiology No bleeding B12 and folate def likely contributing Plan: continue to monitor.   Vit D supplmentation  Overall poor prognosis, palliative care is following   DVT prophylaxis: SCD  Code Status: DNR Family Communication: Son via phone 11/29/2019 Disposition Plan: Anticipate discharge back to [facility] with hospice backup.  Barriers to discharge.  Patient still with possibility of occult infection.  Will need a least 24 hours fever and hypothermia free prior to discharge.  She also has very poor p.o. intake.  Diet was advanced to dysphagia 1 per SLP today.  Patient will need to demonstrate some p.o. intake without need for continuous dextrose containing IV fluids.  IV fluids were stopped today 11/30/2019.  Will need close glucose monitoring.  Anticipate discharge on 12/01/2019.  Would benefit from reevaluation by palliative care service prior to disposition  Consultants:   none  Procedures:   none  Antimicrobials:   none   Subjective: Seen and examined Less communicative today Intermittently yelling out  Objective: Vitals:   11/30/19 0001 11/30/19 0056  11/30/19 0826 11/30/19 1036  BP: (!) 115/48 98/61 (!) 91/57 (!) 108/47  Pulse: 71 70 66   Resp:  18 18   Temp: 97.6 F (36.4 C) (!) 97.4 F (36.3 C) (!) 97.3 F (36.3 C)   TempSrc: Oral Oral    SpO2: 100% (!) 89% 100%   Weight:      Height:        Intake/Output Summary (Last 24 hours) at 11/30/2019 1209 Last data filed at 11/30/2019 [id number] Gross per 24 hour  Intake 1186.25 ml  Output --  Net 1186.25 ml   Filed Weights   11/22/19 2157 11/22/19 2251  Weight: 62.6 kg 62.6 kg    Examination:  General exam: Agitated.  Appears stated age.  Appears chronically ill respiratory system: Clear to auscultation. Respiratory effort normal. Cardiovascular system: S1-S2 heard, RRR, no murmurs, no pedal edema gastrointestinal system: NT/ND, positive bowel sounds Central nervous system: Alert.  Oriented x1.  No focal deficits Extremities: Symmetric 5 x 5 power. Skin: No rashes, lesions or ulcers Psychiatry: Confused.  Does not follow commands    Data Reviewed: I have personally reviewed following labs and imaging studies  CBC: Recent Labs  Lab 11/24/19 0612 11/24/19 0612 11/25/19 0710 11/25/19 0710 11/26/19 0404 11/27/19 0235 11/28/19 [id number] 11/29/19 0558 11/30/19 0528  WBC 7.8   < > 8.1   < > 8.7 10.0 13.0* 10.4 11.1*  NEUTROABS 5.0  --  4.9  --  5.2  --   --  7.3 7.7  HGB 9.4*   < > 9.8*   < > 9.8* 10.2* 10.0* 7.8* 7.9*  HCT 31.3*   < > 30.7*   < > 31.2* 31.4* 31.2* 24.4* 25.4*  MCV 102.0*   < > 98.4   < > 96.9 96.0 97.5 98.4 100.4*  PLT 59*   < > 63*   < > 70* 98* 97* 87* 88*   < > = values in this interval not displayed.   Basic Metabolic Panel: Recent Labs  Lab 11/26/19 0404 11/27/19 0235 11/28/19 0633 11/29/19 0558 11/30/19 0528  NA 152* 147* 145 141 143  K 3.4* 3.4* 3.2* 4.7 4.5  CL 119* 114* 112* 113* 112*  CO2 31 27 27 23 23   GLUCOSE 101* 145* 131* 82 95  BUN 11 10 8 11 14   CREATININE 0.71 0.84 0.74 1.19* 1.06*  CALCIUM 8.7* 8.7* 8.5* 8.3* 8.6*  MG 1.8 1.6*  2.2 1.8 1.8  PHOS 2.0* 4.2 3.6 2.3* 3.3   GFR: Estimated Creatinine Clearance: 35.6 mL/min (A) (by C-G formula based on SCr of 1.06 mg/dL (H)). Liver Function Tests: Recent Labs  Lab 11/24/19 0612 11/25/19 0710 11/26/19 0404  AST 28 25 25   ALT 24 23 25   ALKPHOS 60 64 63  BILITOT 0.8 0.9 0.9  PROT 5.3* 5.2* 5.2*  ALBUMIN 2.8* 2.7* 2.8*   Recent Labs  Lab 11/29/19 0552  LIPASE 19   No results for input(s): AMMONIA in the last 168 hours. Coagulation Profile: No results for input(s): INR, PROTIME in the last 168 hours. Cardiac Enzymes: No results for input(s): CKTOTAL, CKMB,  CKMBINDEX, TROPONINI in the last 168 hours. BNP (last 3 results) No results for input(s): PROBNP in the last 8760 hours. HbA1C: No results for input(s): HGBA1C in the last 72 hours. CBG: Recent Labs  Lab 11/29/19 2049 11/30/19 0003 11/30/19 0402 11/30/19 0828 11/30/19 1159  GLUCAP 96 101* 89 84 79   Lipid Profile: No results for input(s): CHOL, HDL, LDLCALC, TRIG, CHOLHDL, LDLDIRECT in the last 72 hours. Thyroid Function Tests: No results for input(s): TSH, T4TOTAL, FREET4, T3FREE, THYROIDAB in the last 72 hours. Anemia Panel: No results for input(s): VITAMINB12, FOLATE, FERRITIN, TIBC, IRON, RETICCTPCT in the last 72 hours. Sepsis Labs: Recent Labs  Lab 11/28/19 0911  PROCALCITON <0.10    Recent Results (from the past 240 hour(s))  Urine culture     Status: None   Collection Time: 11/22/19 10:20 PM   Specimen: Urine, Catheterized  Result Value Ref Range Status   Specimen Description   Final    URINE, CATHETERIZED Performed at [facility], [address], [place], [place] [address]    Special Requests   Final    NONE Performed at [facility], [address], [place], [place] [address]    Culture   Final    NO GROWTH Performed at [facility], [place] [address], [place], [place] [address]    Report Status 11/24/2019 FINAL  Final  Blood culture (routine x  2)     Status: None   Collection Time: 11/22/19 10:20 PM   Specimen: BLOOD  Result Value Ref Range Status   Specimen Description BLOOD Blood Culture adequate volume  Final   Special Requests   Final    BOTTLES DRAWN AEROBIC AND ANAEROBIC LEFT ANTECUBITAL   Culture   Final    NO GROWTH 5 DAYS Performed at [facility], [address], [place], [place] [address]    Report Status 11/27/2019 FINAL  Final  Blood culture (routine x 2)     Status: None   Collection Time: 11/22/19 10:20 PM   Specimen: BLOOD  Result Value Ref Range Status   Specimen Description BLOOD LEFT WRIST  Final   Special Requests   Final    BOTTLES DRAWN AEROBIC AND ANAEROBIC Blood Culture adequate volume   Culture   Final    NO GROWTH 5 DAYS Performed at [facility], [address] [place], [place] [address]    Report Status 11/27/2019 FINAL  Final  Respiratory Panel by RT PCR (Flu A&B, Covid) - Nasopharyngeal Swab     Status: None   Collection Time: 11/23/19 12:41 AM   Specimen: Nasopharyngeal Swab  Result Value Ref Range Status   SARS Coronavirus 2 by RT PCR NEGATIVE NEGATIVE Final    Comment: (NOTE) SARS-CoV-2 target nucleic acids are NOT DETECTED. The SARS-CoV-2 RNA is generally detectable in upper respiratoy specimens during the acute phase of infection. The lowest concentration of SARS-CoV-2 viral copies this assay can detect is 131 copies/mL. A negative result does not preclude SARS-Cov-2 infection and should not be used as the sole basis for treatment or other patient management decisions. A negative result may occur with  improper specimen collection/handling, submission of specimen other than nasopharyngeal swab, presence of viral mutation(s) within the areas targeted by this assay, and inadequate number of viral copies (<131 copies/mL). A negative result must be combined with clinical observations, patient history, and epidemiological information. The expected result is  Negative. Fact Sheet for Patients:  PinkCheek.be Fact Sheet for Healthcare Providers:  GravelBags.it This test is not yet ap proved or cleared by the [place] and  has been authorized for detection and/or diagnosis of SARS-CoV-2 by FDA under an Emergency Use Authorization (EUA). This EUA will remain  in effect (meaning this test can be used) for the duration of the COVID-19 declaration under Section 564(b)(1) of the Act, 21 U.S.C. section 360bbb-3(b)(1), unless the authorization is terminated or revoked sooner.    Influenza A by PCR NEGATIVE NEGATIVE Final   Influenza B by PCR NEGATIVE NEGATIVE Final    Comment: (NOTE) The Xpert Xpress SARS-CoV-2/FLU/RSV assay is intended as an aid in  the diagnosis of influenza from Nasopharyngeal swab specimens and  should not be used as a sole basis for treatment. Nasal washings and  aspirates are unacceptable for Xpert Xpress SARS-CoV-2/FLU/RSV  testing. Fact Sheet for Patients: PinkCheek.be Fact Sheet for Healthcare Providers: GravelBags.it This test is not yet approved or cleared by the [place] FDA and  has been authorized for detection and/or diagnosis of SARS-CoV-2 by  FDA under an Emergency Use Authorization (EUA). This EUA will remain  in effect (meaning this test can be used) for the duration of the  Covid-19 declaration under Section 564(b)(1) of the Act, 21  U.S.C. section 360bbb-3(b)(1), unless the authorization is  terminated or revoked. Performed at [facility], [address] [place], [place] [address]   MRSA PCR Screening     Status: None   Collection Time: 11/25/19  5:36 AM   Specimen: Nasopharyngeal  Result Value Ref Range Status   MRSA by PCR NEGATIVE NEGATIVE Final    Comment:        The GeneXpert MRSA Assay (FDA approved for NASAL specimens only), is one component of  a comprehensive MRSA colonization surveillance program. It is not intended to diagnose MRSA infection nor to guide or monitor treatment for MRSA infections. Performed at [facility], [address] [place], [place] [address]   CULTURE, BLOOD (ROUTINE X 2) w Reflex to ID Panel     Status: None (Preliminary result)   Collection Time: 11/28/19  9:10 AM   Specimen: BLOOD  Result Value Ref Range Status   Specimen Description BLOOD LEFT ANTECUBITAL  Final   Special Requests   Final    BOTTLES DRAWN AEROBIC AND ANAEROBIC Blood Culture results may not be optimal due to an excessive volume of blood received in culture bottles   Culture   Final    NO GROWTH 2 DAYS Performed at [facility], [address], [place], [place] [address]    Report Status PENDING  Incomplete  CULTURE, BLOOD (ROUTINE X 2) w Reflex to ID Panel     Status: None (Preliminary result)   Collection Time: 11/28/19  9:16 AM   Specimen: BLOOD  Result Value Ref Range Status   Specimen Description BLOOD BLOOD LEFT FOREARM  Final   Special Requests   Final    BOTTLES DRAWN AEROBIC AND ANAEROBIC Blood Culture adequate volume   Culture   Final    NO GROWTH 2 DAYS Performed at [facility], [address] [place], [place] [address]    Report Status PENDING  Incomplete  Urine Culture     Status: None   Collection Time: 11/28/19  4:33 PM   Specimen: Urine, Random  Result Value Ref Range Status   Specimen Description URINE, RANDOM  Final   Special Requests   Final    NONE Performed at [facility], [address] [place], [place] [address]    Culture NO GROWTH  Final   Report Status 11/30/2019 FINAL  Final         Radiology Studies: No results found.      Scheduled Meds: . vitamin C  500 mg Oral Daily  . atorvastatin  40 mg Oral Q M,W,F  . Chlorhexidine Gluconate Cloth  6 each Topical Daily  . cholecalciferol  400 Units Oral Daily  . cyanocobalamin  1,000 mcg  Intramuscular Daily  . divalproex  250 mg Oral BID  . famotidine  40 mg Oral QHS  . gabapentin  300 mg Oral QHS  . levofloxacin  750 mg Oral Q48H  . levothyroxine  25 mcg Oral Q0600  . metoprolol tartrate  25 mg Oral BID  . traZODone  50 mg Oral QHS   Continuous Infusions: . folic acid (FOLVITE) IVPB 1 mg (11/30/19 1012)  . thiamine injection Stopped (11/29/19 1816)     LOS: 7 days    Time spent: 35 minutes    Sidney Ace, MD [facility] Pager [phone]  If 7PM-7AM, please contact night-coverage  11/30/2019, 12:09 PM

## 2019-11-30 NOTE — Progress Notes (Signed)
Physical Therapy Treatment Patient Details Name: Brianna Woods MRN: [id number] DOB: [birth year] Today's Date: 11/30/2019    History of Present Illness Pt is an 85 y.o. female presenting to hospital 11/22/19 with AMS and abnormal labs; also decreased PO intake.  Pt admitted with SIRS manifested with hypothermia, hypotension, acute encephalopathy, hyperglycemia, abnormal electrolytes, hypothyroidism, and htn.  PMH includes Barrett's esophagus, fibromyalgia, h/o anxiety, h/o CVA, dementia, GERD, hypothyroidism, htn, anemia.    PT Comments    Patient is asleep upon arrival but is easily woken up. She agrees to PT treatment. She performs AAROM BLE including knee to chest, hip abd/add, hip flex bilaterally x 10 reps. She needs max assist for supine <> sit bed mobility. She needs max assist for sitting edge of bed and does not attempt to assist herself with her balance by UE support. She is talking today and answering questions appropriately. She will continue to benefit from skilled PT to improve mobility and strength.   Follow Up Recommendations  SNF     Equipment Recommendations  Rolling walker with 5" wheels    Recommendations for Other Services       Precautions / Restrictions Restrictions Weight Bearing Restrictions: No    Mobility  Bed Mobility Overal bed mobility: Needs Assistance Bed Mobility: Supine to Sit;Sit to Supine     Supine to sit: Max assist Sit to supine: Max assist   General bed mobility comments: unable to follow commands  Transfers                    Ambulation/Gait                 Stairs             Wheelchair Mobility    Modified Rankin (Stroke Patients Only)       Balance Overall balance assessment: Needs assistance Sitting-balance support: Single extremity supported;Feet supported Sitting balance-Leahy Scale: Poor   Postural control: Posterior lean                                  Cognition  Arousal/Alertness: Awake/alert Behavior During Therapy: Flat affect Overall Cognitive Status: Difficult to assess                                        Exercises      General Comments        Pertinent Vitals/Pain Pain Assessment: Faces Faces Pain Scale: Hurts little more Pain Location: (left foot) Pain Intervention(s): Limited activity within patient's tolerance;Monitored during session;Premedicated before session    Home Living                      Prior Function            PT Goals (current goals can now be found in the care plan section)      Frequency    Min 2X/week      PT Plan Current plan remains appropriate    Co-evaluation              AM-PAC PT "6 Clicks" Mobility   Outcome Measure  Help needed turning from your back to your side while in a flat bed without using bedrails?: Total Help needed moving from lying on your back to sitting on the side of a flat  bed without using bedrails?: Total Help needed moving to and from a bed to a chair (including a wheelchair)?: Total Help needed standing up from a chair using your arms (e.g., wheelchair or bedside chair)?: Total Help needed to walk in hospital room?: Total Help needed climbing 3-5 steps with a railing? : Total 6 Click Score: 6    End of Session Equipment Utilized During Treatment: Gait belt Activity Tolerance: Patient tolerated treatment well Patient left: in bed;with bed alarm set Nurse Communication: Mobility status PT Visit Diagnosis: Unsteadiness on feet (R26.81);Muscle weakness (generalized) (M62.81);Difficulty in walking, not elsewhere classified (R26.2)     Time: 1030-1100 PT Time Calculation (min) (ACUTE ONLY): 30 min  Charges:  $Therapeutic Exercise: 8-22 mins $Therapeutic Activity: 8-22 mins                        Alanson Puls, PT DPT 11/30/2019, 11:17 AM

## 2019-12-01 DIAGNOSIS — G4700 Insomnia, unspecified: Secondary | ICD-10-CM | POA: Diagnosis not present

## 2019-12-01 DIAGNOSIS — F3289 Other specified depressive episodes: Secondary | ICD-10-CM | POA: Diagnosis not present

## 2019-12-01 DIAGNOSIS — F0390 Unspecified dementia without behavioral disturbance: Secondary | ICD-10-CM | POA: Diagnosis not present

## 2019-12-01 DIAGNOSIS — R451 Restlessness and agitation: Secondary | ICD-10-CM | POA: Diagnosis not present

## 2019-12-01 LAB — CBC WITH DIFFERENTIAL/PLATELET
Abs Immature Granulocytes: 0.04 10*3/uL (ref 0.00–0.07)
Basophils Absolute: 0 10*3/uL (ref 0.0–0.1)
Basophils Relative: 0 %
Eosinophils Absolute: 0.3 10*3/uL (ref 0.0–0.5)
Eosinophils Relative: 3 %
HCT: 23.3 % — ABNORMAL LOW (ref 36.0–46.0)
Hemoglobin: 7.4 g/dL — ABNORMAL LOW (ref 12.0–15.0)
Immature Granulocytes: 1 %
Lymphocytes Relative: 16 %
Lymphs Abs: 1.3 10*3/uL (ref 0.7–4.0)
MCH: 31.5 pg (ref 26.0–34.0)
MCHC: 31.8 g/dL (ref 30.0–36.0)
MCV: 99.1 fL (ref 80.0–100.0)
Monocytes Absolute: 1 10*3/uL (ref 0.1–1.0)
Monocytes Relative: 12 %
Neutro Abs: 5.8 10*3/uL (ref 1.7–7.7)
Neutrophils Relative %: 68 %
Platelets: 84 10*3/uL — ABNORMAL LOW (ref 150–400)
RBC: 2.35 MIL/uL — ABNORMAL LOW (ref 3.87–5.11)
RDW: 17.4 % — ABNORMAL HIGH (ref 11.5–15.5)
WBC: 8.5 10*3/uL (ref 4.0–10.5)
nRBC: 0 % (ref 0.0–0.2)

## 2019-12-01 LAB — GLUCOSE, CAPILLARY
Glucose-Capillary: 108 mg/dL — ABNORMAL HIGH (ref 70–99)
Glucose-Capillary: 63 mg/dL — ABNORMAL LOW (ref 70–99)
Glucose-Capillary: 74 mg/dL (ref 70–99)
Glucose-Capillary: 75 mg/dL (ref 70–99)
Glucose-Capillary: 82 mg/dL (ref 70–99)
Glucose-Capillary: 97 mg/dL (ref 70–99)

## 2019-12-01 LAB — BASIC METABOLIC PANEL
Anion gap: 6 (ref 5–15)
BUN: 12 mg/dL (ref 8–23)
CO2: 22 mmol/L (ref 22–32)
Calcium: 8.4 mg/dL — ABNORMAL LOW (ref 8.9–10.3)
Chloride: 114 mmol/L — ABNORMAL HIGH (ref 98–111)
Creatinine, Ser: 0.88 mg/dL (ref 0.44–1.00)
GFR calc Af Amer: >60 mL/min (ref >60)
GFR calc non Af Amer: >60 mL/min (ref >60)
Glucose, Bld: 94 mg/dL (ref 70–99)
Potassium: 4.2 mmol/L (ref 3.5–5.1)
Sodium: 142 mmol/L (ref 135–145)

## 2019-12-01 LAB — RESPIRATORY PANEL BY RT PCR (FLU A&B, COVID)
Influenza A by PCR: NEGATIVE
Influenza B by PCR: NEGATIVE
SARS Coronavirus 2 by RT PCR: NEGATIVE

## 2019-12-01 LAB — PHOSPHORUS: Phosphorus: 3.9 mg/dL (ref 2.5–4.6)

## 2019-12-01 LAB — MAGNESIUM: Magnesium: 1.9 mg/dL (ref 1.7–2.4)

## 2019-12-01 MED ORDER — MORPHINE SULFATE (CONCENTRATE) 10 MG /0.5 ML PO SOLN: 5.0000 mg | ORAL | 0 refills | Status: AC | PRN

## 2019-12-01 MED ORDER — MORPHINE SULFATE (CONCENTRATE) 10 MG /0.5 ML PO SOLN: 5.0000 mg | ORAL | 0 refills | Status: DC | PRN

## 2019-12-01 MED ORDER — LORAZEPAM 1 MG PO TABS: 1.0000 mg | ORAL_TABLET | Freq: Every evening | ORAL | 0 refills | Status: DC | PRN

## 2019-12-01 NOTE — TOC Transition Note (Signed)
Transition of Care [facility]) - CM/SW Discharge Note   Patient Details  Name: Brianna Woods MRN: [id number] Date of Birth: 1934/11/29  [facility]) CM/SW Contact:  Shelbie Hutching, RN Phone Number: 12/01/2019, 3:31 PM   Clinical Narrative:    Patient will discharge back to [facility] with [facility] services.  Patient is going to room 604, Bedside RN has called report to [phone] and this RNCM has arranged EMS transport.  Patient's family is aware of discharge today.     Final next level of care: Home w Hospice Care(To [facility] with [facility]) Barriers to Discharge: Continued Medical Work up   Patient Goals and CMS Choice Patient states their goals for this hospitalization and ongoing recovery are:: Family would like for patient to be hospice CMS Medicare.gov Compare Post Acute Care list provided to:: Patient Represenative (must comment) Choice offered to / list presented to : Adult Children(son Ray)  Discharge Placement              Patient chooses bed at: [facility] [place] Patient to be transferred to facility by: [facility] Name of family member notified: Ladona Horns- son Patient and family notified of of transfer: 12/01/19  Discharge Plan and Services In-house Referral: Hospice / Palliative Care Discharge Planning Services: CM Consult                                 Social Determinants of Health (SDOH) Interventions     Readmission Risk Interventions No flowsheet data found.

## 2019-12-01 NOTE — Discharge Summary (Signed)
[facility] [place] NAME: Brianna Woods    MR#:  [id number]  DATE OF BIRTH:  [birth year]  DATE OF ADMISSION:  11/22/2019   ADMITTING PHYSICIAN: Christel Mormon, MD  DATE OF DISCHARGE: 12/01/2019  PRIMARY CARE PHYSICIAN: Ria Bush, MD   ADMISSION DIAGNOSIS:  Hypokalemia [E87.6] Hypernatremia [E87.0] Altered mental status [R41.82] Hypoglycemia [E16.2] Encephalopathy [G93.40] Hypothermia, initial encounter [T68.XXXA] DISCHARGE DIAGNOSIS:  Active Problems:   Goals of care, counseling/discussion   Altered mental status   Encephalopathy   Hypernatremia   Palliative care by specialist   Hypokalemia   Hypoglycemia   Adult failure to thrive   Protein-calorie malnutrition, severe  SECONDARY DIAGNOSIS:   Past Medical History:  Diagnosis Date  . Anemia   . Arthritis   . Asthma    per pt  . Atherosclerosis of abdominal aorta (Iron) 07/2015   by CT  . Barrett's esophagus    on EGD 2008, EGD WNL 2013  . CAD (coronary artery disease) 07/2015   of LAD by CT  . Diverticulosis 2014   sigmoid on colonoscopy  . Fatty pancreas 07/2015   by CT  . Fibromyalgia    per pt, no records of this   . GERD (gastroesophageal reflux disease)    and esoph stricture s/p dilation 2008 HH by CT 2016  . Glaucoma   . History of anxiety    was on prozac then effexor (pt denies h/o anxiety/depression)  . History of cardiac murmur   . History of chicken pox   . History of CVA (cerebrovascular accident) 2008   "I've had several TIAs"  . History of right bundle branch block 2011  . History of ulcer disease    per pt, no records of this  . HLD (hyperlipidemia)   . HTN (hypertension)   . Kidney cyst, acquired 07/2015   by CT, rec rpt renal MRI in 6 months  . Mixed incontinence    on ditropan  . Nephrolithiasis    s/p surgery R kidney (13mm) 11/2009  . Nodular goiter, toxic or with hyperthyroidism    h/o toxic, on methimazole, known large left thyroid nodule 08/2013  s/p beign biopsy per patient 2010, saw Dr. Gabriel Carina, Rad I ablation 04/2014  . Vitamin D deficiency    HOSPITAL COURSE:  JoettaFordis a85 y.o.African-American femalewith a known history of multiple medical problems including asthma, coronary artery disease, fibromyalgia, hypertension, dyslipidemia admitted for acute onset ofaltered mental status and abnormal labs at [facility]. She's been admitted for acute metabolic encephalopathy, hypothermia, and hypoglycemia. She's been treated with antibiotics empirically for presumed infection, though no definitive evidence yet.   Sepsis -present on admission, treated. Acute metabolic encephalopathy Hypothermia Hypoglycemia Hypernatremia  Hyperchloremia Hypophosphatemia Hypokalemia:  Hypothyroidism Hypertension. GERD. Peripheral neuropathy. Dyslipidemia. Anemia  Thrombocytopenia  Overall poor prognosis  Goals of Care: [facility] has [facility] her comfortable and avoiding hospitalization. DISCHARGE CONDITIONS:  Fair CONSULTS OBTAINED:   DRUG ALLERGIES:   Allergies  Allergen Reactions  . Ivp Dye [Iodinated Diagnostic Agents]     Turns red; BP and HR go up  . Penicillins     "Sends me in left field"  . Statins Other (See Comments)    Body aches (lipitor, simvastatin, pravastatin)   DISCHARGE MEDICATIONS:   Allergies as of 12/01/2019      Reactions   Ivp Dye [iodinated Diagnostic Agents]    Turns red; BP and HR go up  Penicillins    "Sends me in left field"   Statins Other (See Comments)   Body aches (lipitor, simvastatin, pravastatin)      Medication List    STOP taking these medications   atorvastatin 40 MG tablet Commonly known as: LIPITOR   esomeprazole 40 MG capsule Commonly known as: NEXIUM   famotidine 40 MG tablet Commonly known as: PEPCID   losartan 100 MG tablet Commonly known as: COZAAR   metoprolol succinate 25 MG 24 hr  tablet Commonly known as: TOPROL-XL   pantoprazole 40 MG tablet Commonly known as: PROTONIX   potassium chloride 20 MEQ/15ML (10%) Soln   traZODone 100 MG tablet Commonly known as: DESYREL   vitamin C 500 MG tablet Commonly known as: ASCORBIC ACID     TAKE these medications   divalproex 125 MG capsule Commonly known as: DEPAKOTE SPRINKLE Take 250 mg by mouth 2 (two) times daily.   gabapentin 300 MG capsule Commonly known as: NEURONTIN Take 1 capsule (300 mg total) by mouth at bedtime.   levothyroxine 25 MCG tablet Commonly known as: SYNTHROID Take 25 mcg by mouth daily before breakfast. What changed: Another medication with the same name was removed. Continue taking this medication, and follow the directions you see here.   LORazepam 1 MG tablet Commonly known as: ATIVAN Take 1 tablet (1 mg total) by mouth at bedtime as needed for anxiety.   morphine CONCENTRATE 10 mg / 0.5 ml concentrated solution Take 0.25 mLs (5 mg total) by mouth every 2 (two) hours as needed for up to 3 days for severe pain, anxiety or shortness of breath.      DISCHARGE INSTRUCTIONS:   DIET:  Pleasure feeding DISCHARGE CONDITION:  Fair ACTIVITY:  Activity as tolerated OXYGEN:  Home Oxygen: No.  Oxygen Delivery: room air DISCHARGE LOCATION:  [facility]/long-term care with hospice  If you experience worsening of your admission symptoms, develop shortness of breath, life threatening emergency, suicidal or homicidal thoughts you must seek medical attention immediately by calling 911 or calling your MD immediately  if symptoms less severe.  You Must read complete instructions/literature along with all the possible adverse reactions/side effects for all the Medicines you take and that have been prescribed to you. Take any new Medicines after you have completely understood and accpet all the possible adverse reactions/side effects.   Please note  You were cared for by a  hospitalist during your hospital stay. If you have any questions about your discharge medications or the care you received while you were in the hospital after you are discharged, you can call the unit and asked to speak with the hospitalist on call if the hospitalist that took care of you is not available. Once you are discharged, your primary care physician will handle any further medical issues. Please note that NO REFILLS for any discharge medications will be authorized once you are discharged, as it is imperative that you return to your primary care physician (or establish a relationship with a primary care physician if you do not have one) for your aftercare needs so that they can reassess your need for medications and monitor your lab values.    On the day of Discharge:  VITAL SIGNS:  Blood pressure 116/63, pulse (!) 56, temperature (!) 94.1 F (34.5 C), resp. rate 18, height 5\' 2"  (1.575 m), weight 62.6 kg, SpO2 100 %. PHYSICAL EXAMINATION:  GENERAL:  85 y.o.-year-old patient lying in the bed with no acute distress.  General  exam: Agitated.  Appears stated age.  Appears chronically ill  respiratory system: Clear to auscultation. Respiratory effort normal. Cardiovascular system: S1-S2 heard, RRR, no murmurs, no pedal edema  gastrointestinal system: NT/ND, positive bowel sounds Central nervous system: Alert.  Oriented x1.  No focal deficits Extremities: Symmetric 5 x 5 power. Skin: No rashes, lesions or ulcers Psychiatry: Confused.  Does not follow commands DATA REVIEW:   CBC Recent Labs  Lab 12/01/19 0539  WBC 8.5  HGB 7.4*  HCT 23.3*  PLT 84*    Chemistries  Recent Labs  Lab 11/26/19 0404 11/27/19 0235 12/01/19 0539  NA 152*   < > 142  K 3.4*   < > 4.2  CL 119*   < > 114*  CO2 31   < > 22  GLUCOSE 101*   < > 94  BUN 11   < > 12  CREATININE 0.71   < > 0.88  CALCIUM 8.7*   < > 8.4*  MG 1.8   < > 1.9  AST 25  --   --   ALT 25  --   --   ALKPHOS 63  --   --    BILITOT 0.9  --   --    < > = values in this interval not displayed.     Outpatient follow-up Follow-up Information    Ria Bush, MD. Schedule an appointment as soon as possible for a visit in 1 week(s).   Specialty: Family Medicine Contact information: [place] [address] [phone]            Management plans discussed with the patient, family (son over phone) and they are in agreement.  CODE STATUS: DNR   TOTAL TIME TAKING CARE OF THIS PATIENT: 45 minutes.    Max Sane M.D on 12/01/2019 at 1:37 PM  [facility]   CC: Primary care physician; Ria Bush, MD   Note: This dictation was prepared with Dragon dictation along with smaller phrase technology. Any transcriptional errors that result from this process are unintentional.

## 2019-12-01 NOTE — Care Management Important Message (Signed)
Important Message  Patient Details  Name: [name] MRN: [id number] Date of Birth: 1935-04-18   Medicare Important Message Given:  Yes     Shelbie Ammons, RN 12/01/2019, 10:44 AM

## 2019-12-01 NOTE — Progress Notes (Signed)
New referral for [facility] hospice services at [facility] received from [facility]. Patient information sent to referral. Writer spoke via telephone to patient's son [name] "Ray" Harrington Challenger to initiatd education regarding hospice services, philosophy and team approach to care with understanding voiced. Questions answered. Plan is for patient to discharge back to [facility] today via EMS. Signed out of facility DNR to accompany patient. TOC updated. Thank you for this referral. Flo Shanks BSN, RN, [place] [phone]

## 2019-12-03 LAB — CULTURE, BLOOD (ROUTINE X 2)
Culture: NO GROWTH
Culture: NO GROWTH
Special Requests: ADEQUATE

## 2019-12-08 ENCOUNTER — Inpatient Hospital Stay: Payer: Medicare HMO | Admitting: Family Medicine

## 2019-12-09 DIAGNOSIS — E039 Hypothyroidism, unspecified: Secondary | ICD-10-CM | POA: Diagnosis not present

## 2019-12-09 DIAGNOSIS — F0391 Unspecified dementia with behavioral disturbance: Secondary | ICD-10-CM | POA: Diagnosis not present

## 2019-12-09 DIAGNOSIS — R4182 Altered mental status, unspecified: Secondary | ICD-10-CM | POA: Diagnosis not present

## 2019-12-09 DIAGNOSIS — I10 Essential (primary) hypertension: Secondary | ICD-10-CM | POA: Diagnosis not present

## 2019-12-09 DIAGNOSIS — M6281 Muscle weakness (generalized): Secondary | ICD-10-CM | POA: Diagnosis not present

## 2019-12-10 DIAGNOSIS — E569 Vitamin deficiency, unspecified: Secondary | ICD-10-CM | POA: Diagnosis not present

## 2019-12-10 DIAGNOSIS — R569 Unspecified convulsions: Secondary | ICD-10-CM | POA: Diagnosis not present

## 2019-12-10 DIAGNOSIS — D649 Anemia, unspecified: Secondary | ICD-10-CM | POA: Diagnosis not present

## 2019-12-10 DIAGNOSIS — Z79899 Other long term (current) drug therapy: Secondary | ICD-10-CM | POA: Diagnosis not present

## 2019-12-16 DIAGNOSIS — I10 Essential (primary) hypertension: Secondary | ICD-10-CM | POA: Diagnosis not present

## 2019-12-19 DIAGNOSIS — D649 Anemia, unspecified: Secondary | ICD-10-CM | POA: Diagnosis not present

## 2019-12-19 DIAGNOSIS — E039 Hypothyroidism, unspecified: Secondary | ICD-10-CM | POA: Diagnosis not present

## 2019-12-19 DIAGNOSIS — F0390 Unspecified dementia without behavioral disturbance: Secondary | ICD-10-CM | POA: Diagnosis not present

## 2019-12-19 DIAGNOSIS — I110 Hypertensive heart disease with heart failure: Secondary | ICD-10-CM | POA: Diagnosis not present

## 2019-12-19 DIAGNOSIS — M5489 Other dorsalgia: Secondary | ICD-10-CM | POA: Diagnosis not present

## 2019-12-24 DIAGNOSIS — Z1159 Encounter for screening for other viral diseases: Secondary | ICD-10-CM | POA: Diagnosis not present

## 2019-12-30 DIAGNOSIS — Z1159 Encounter for screening for other viral diseases: Secondary | ICD-10-CM | POA: Diagnosis not present

## 2020-01-06 DIAGNOSIS — Z1159 Encounter for screening for other viral diseases: Secondary | ICD-10-CM | POA: Diagnosis not present

## 2020-01-07 DIAGNOSIS — E039 Hypothyroidism, unspecified: Secondary | ICD-10-CM | POA: Diagnosis not present

## 2020-01-07 DIAGNOSIS — F0390 Unspecified dementia without behavioral disturbance: Secondary | ICD-10-CM | POA: Diagnosis not present

## 2020-01-07 DIAGNOSIS — R4182 Altered mental status, unspecified: Secondary | ICD-10-CM | POA: Diagnosis not present

## 2020-01-07 DIAGNOSIS — M79642 Pain in left hand: Secondary | ICD-10-CM | POA: Diagnosis not present

## 2020-01-07 DIAGNOSIS — M79641 Pain in right hand: Secondary | ICD-10-CM | POA: Diagnosis not present

## 2020-01-07 DIAGNOSIS — I10 Essential (primary) hypertension: Secondary | ICD-10-CM | POA: Diagnosis not present

## 2020-01-08 DIAGNOSIS — I10 Essential (primary) hypertension: Secondary | ICD-10-CM | POA: Diagnosis not present

## 2020-01-08 DIAGNOSIS — R569 Unspecified convulsions: Secondary | ICD-10-CM | POA: Diagnosis not present

## 2020-01-08 DIAGNOSIS — E569 Vitamin deficiency, unspecified: Secondary | ICD-10-CM | POA: Diagnosis not present

## 2020-02-05 DIAGNOSIS — D649 Anemia, unspecified: Secondary | ICD-10-CM | POA: Diagnosis not present

## 2020-02-05 DIAGNOSIS — K219 Gastro-esophageal reflux disease without esophagitis: Secondary | ICD-10-CM | POA: Diagnosis not present

## 2020-02-05 DIAGNOSIS — I10 Essential (primary) hypertension: Secondary | ICD-10-CM | POA: Diagnosis not present

## 2020-02-05 DIAGNOSIS — M79641 Pain in right hand: Secondary | ICD-10-CM | POA: Diagnosis not present

## 2020-02-05 DIAGNOSIS — F0390 Unspecified dementia without behavioral disturbance: Secondary | ICD-10-CM | POA: Diagnosis not present

## 2020-02-05 DIAGNOSIS — K2270 Barrett's esophagus without dysplasia: Secondary | ICD-10-CM | POA: Diagnosis not present

## 2020-02-05 DIAGNOSIS — E039 Hypothyroidism, unspecified: Secondary | ICD-10-CM | POA: Diagnosis not present

## 2020-02-05 DIAGNOSIS — R4182 Altered mental status, unspecified: Secondary | ICD-10-CM | POA: Diagnosis not present

## 2020-02-05 DIAGNOSIS — M79642 Pain in left hand: Secondary | ICD-10-CM | POA: Diagnosis not present

## 2020-03-29 DIAGNOSIS — F0390 Unspecified dementia without behavioral disturbance: Secondary | ICD-10-CM | POA: Diagnosis not present

## 2020-03-29 DIAGNOSIS — K219 Gastro-esophageal reflux disease without esophagitis: Secondary | ICD-10-CM | POA: Diagnosis not present

## 2020-03-29 DIAGNOSIS — F3289 Other specified depressive episodes: Secondary | ICD-10-CM | POA: Diagnosis not present

## 2020-03-29 DIAGNOSIS — E569 Vitamin deficiency, unspecified: Secondary | ICD-10-CM | POA: Diagnosis not present

## 2020-03-29 DIAGNOSIS — R1312 Dysphagia, oropharyngeal phase: Secondary | ICD-10-CM | POA: Diagnosis not present

## 2020-03-29 DIAGNOSIS — E7849 Other hyperlipidemia: Secondary | ICD-10-CM | POA: Diagnosis not present

## 2020-03-29 DIAGNOSIS — I10 Essential (primary) hypertension: Secondary | ICD-10-CM | POA: Diagnosis not present

## 2020-03-29 DIAGNOSIS — E039 Hypothyroidism, unspecified: Secondary | ICD-10-CM | POA: Diagnosis not present

## 2020-03-30 DIAGNOSIS — I10 Essential (primary) hypertension: Secondary | ICD-10-CM | POA: Diagnosis not present

## 2020-03-30 DIAGNOSIS — R1312 Dysphagia, oropharyngeal phase: Secondary | ICD-10-CM | POA: Diagnosis not present

## 2020-03-30 DIAGNOSIS — F3289 Other specified depressive episodes: Secondary | ICD-10-CM | POA: Diagnosis not present

## 2020-03-30 DIAGNOSIS — E7849 Other hyperlipidemia: Secondary | ICD-10-CM | POA: Diagnosis not present

## 2020-03-30 DIAGNOSIS — K219 Gastro-esophageal reflux disease without esophagitis: Secondary | ICD-10-CM | POA: Diagnosis not present

## 2020-03-30 DIAGNOSIS — E039 Hypothyroidism, unspecified: Secondary | ICD-10-CM | POA: Diagnosis not present

## 2020-03-30 DIAGNOSIS — F0390 Unspecified dementia without behavioral disturbance: Secondary | ICD-10-CM | POA: Diagnosis not present

## 2020-03-30 DIAGNOSIS — E569 Vitamin deficiency, unspecified: Secondary | ICD-10-CM | POA: Diagnosis not present

## 2020-03-31 DIAGNOSIS — F3289 Other specified depressive episodes: Secondary | ICD-10-CM | POA: Diagnosis not present

## 2020-03-31 DIAGNOSIS — E569 Vitamin deficiency, unspecified: Secondary | ICD-10-CM | POA: Diagnosis not present

## 2020-03-31 DIAGNOSIS — E039 Hypothyroidism, unspecified: Secondary | ICD-10-CM | POA: Diagnosis not present

## 2020-03-31 DIAGNOSIS — K219 Gastro-esophageal reflux disease without esophagitis: Secondary | ICD-10-CM | POA: Diagnosis not present

## 2020-03-31 DIAGNOSIS — E7849 Other hyperlipidemia: Secondary | ICD-10-CM | POA: Diagnosis not present

## 2020-03-31 DIAGNOSIS — I10 Essential (primary) hypertension: Secondary | ICD-10-CM | POA: Diagnosis not present

## 2020-03-31 DIAGNOSIS — F0390 Unspecified dementia without behavioral disturbance: Secondary | ICD-10-CM | POA: Diagnosis not present

## 2020-03-31 DIAGNOSIS — R1312 Dysphagia, oropharyngeal phase: Secondary | ICD-10-CM | POA: Diagnosis not present

## 2020-04-01 DIAGNOSIS — E569 Vitamin deficiency, unspecified: Secondary | ICD-10-CM | POA: Diagnosis not present

## 2020-04-01 DIAGNOSIS — R1312 Dysphagia, oropharyngeal phase: Secondary | ICD-10-CM | POA: Diagnosis not present

## 2020-04-01 DIAGNOSIS — F0390 Unspecified dementia without behavioral disturbance: Secondary | ICD-10-CM | POA: Diagnosis not present

## 2020-04-01 DIAGNOSIS — I10 Essential (primary) hypertension: Secondary | ICD-10-CM | POA: Diagnosis not present

## 2020-04-01 DIAGNOSIS — F3289 Other specified depressive episodes: Secondary | ICD-10-CM | POA: Diagnosis not present

## 2020-04-01 DIAGNOSIS — E039 Hypothyroidism, unspecified: Secondary | ICD-10-CM | POA: Diagnosis not present

## 2020-04-01 DIAGNOSIS — E7849 Other hyperlipidemia: Secondary | ICD-10-CM | POA: Diagnosis not present

## 2020-04-01 DIAGNOSIS — K219 Gastro-esophageal reflux disease without esophagitis: Secondary | ICD-10-CM | POA: Diagnosis not present

## 2020-04-06 DIAGNOSIS — I10 Essential (primary) hypertension: Secondary | ICD-10-CM | POA: Diagnosis not present

## 2020-04-06 DIAGNOSIS — M797 Fibromyalgia: Secondary | ICD-10-CM | POA: Diagnosis not present

## 2020-04-06 DIAGNOSIS — F0390 Unspecified dementia without behavioral disturbance: Secondary | ICD-10-CM | POA: Diagnosis not present

## 2020-04-06 DIAGNOSIS — E039 Hypothyroidism, unspecified: Secondary | ICD-10-CM | POA: Diagnosis not present

## 2020-05-25 DIAGNOSIS — F3289 Other specified depressive episodes: Secondary | ICD-10-CM | POA: Diagnosis not present

## 2020-05-25 DIAGNOSIS — E039 Hypothyroidism, unspecified: Secondary | ICD-10-CM | POA: Diagnosis not present

## 2020-05-25 DIAGNOSIS — K219 Gastro-esophageal reflux disease without esophagitis: Secondary | ICD-10-CM | POA: Diagnosis not present

## 2020-05-25 DIAGNOSIS — F0390 Unspecified dementia without behavioral disturbance: Secondary | ICD-10-CM | POA: Diagnosis not present

## 2020-05-25 DIAGNOSIS — R2681 Unsteadiness on feet: Secondary | ICD-10-CM | POA: Diagnosis not present

## 2020-05-25 DIAGNOSIS — E7849 Other hyperlipidemia: Secondary | ICD-10-CM | POA: Diagnosis not present

## 2020-05-25 DIAGNOSIS — I10 Essential (primary) hypertension: Secondary | ICD-10-CM | POA: Diagnosis not present

## 2020-05-25 DIAGNOSIS — R262 Difficulty in walking, not elsewhere classified: Secondary | ICD-10-CM | POA: Diagnosis not present

## 2020-05-25 DIAGNOSIS — E569 Vitamin deficiency, unspecified: Secondary | ICD-10-CM | POA: Diagnosis not present

## 2020-05-27 DIAGNOSIS — F3289 Other specified depressive episodes: Secondary | ICD-10-CM | POA: Diagnosis not present

## 2020-05-27 DIAGNOSIS — I10 Essential (primary) hypertension: Secondary | ICD-10-CM | POA: Diagnosis not present

## 2020-05-27 DIAGNOSIS — R262 Difficulty in walking, not elsewhere classified: Secondary | ICD-10-CM | POA: Diagnosis not present

## 2020-05-27 DIAGNOSIS — E039 Hypothyroidism, unspecified: Secondary | ICD-10-CM | POA: Diagnosis not present

## 2020-05-27 DIAGNOSIS — E569 Vitamin deficiency, unspecified: Secondary | ICD-10-CM | POA: Diagnosis not present

## 2020-05-27 DIAGNOSIS — K219 Gastro-esophageal reflux disease without esophagitis: Secondary | ICD-10-CM | POA: Diagnosis not present

## 2020-05-27 DIAGNOSIS — F0390 Unspecified dementia without behavioral disturbance: Secondary | ICD-10-CM | POA: Diagnosis not present

## 2020-05-27 DIAGNOSIS — R2681 Unsteadiness on feet: Secondary | ICD-10-CM | POA: Diagnosis not present

## 2020-05-27 DIAGNOSIS — E7849 Other hyperlipidemia: Secondary | ICD-10-CM | POA: Diagnosis not present

## 2020-05-28 DIAGNOSIS — E039 Hypothyroidism, unspecified: Secondary | ICD-10-CM | POA: Diagnosis not present

## 2020-05-28 DIAGNOSIS — K219 Gastro-esophageal reflux disease without esophagitis: Secondary | ICD-10-CM | POA: Diagnosis not present

## 2020-05-28 DIAGNOSIS — R2681 Unsteadiness on feet: Secondary | ICD-10-CM | POA: Diagnosis not present

## 2020-05-28 DIAGNOSIS — F3289 Other specified depressive episodes: Secondary | ICD-10-CM | POA: Diagnosis not present

## 2020-05-28 DIAGNOSIS — F0390 Unspecified dementia without behavioral disturbance: Secondary | ICD-10-CM | POA: Diagnosis not present

## 2020-05-28 DIAGNOSIS — I10 Essential (primary) hypertension: Secondary | ICD-10-CM | POA: Diagnosis not present

## 2020-05-28 DIAGNOSIS — E7849 Other hyperlipidemia: Secondary | ICD-10-CM | POA: Diagnosis not present

## 2020-05-28 DIAGNOSIS — R262 Difficulty in walking, not elsewhere classified: Secondary | ICD-10-CM | POA: Diagnosis not present

## 2020-05-28 DIAGNOSIS — E569 Vitamin deficiency, unspecified: Secondary | ICD-10-CM | POA: Diagnosis not present

## 2020-05-31 DIAGNOSIS — F3289 Other specified depressive episodes: Secondary | ICD-10-CM | POA: Diagnosis not present

## 2020-05-31 DIAGNOSIS — E039 Hypothyroidism, unspecified: Secondary | ICD-10-CM | POA: Diagnosis not present

## 2020-05-31 DIAGNOSIS — F0390 Unspecified dementia without behavioral disturbance: Secondary | ICD-10-CM | POA: Diagnosis not present

## 2020-05-31 DIAGNOSIS — K219 Gastro-esophageal reflux disease without esophagitis: Secondary | ICD-10-CM | POA: Diagnosis not present

## 2020-05-31 DIAGNOSIS — I10 Essential (primary) hypertension: Secondary | ICD-10-CM | POA: Diagnosis not present

## 2020-05-31 DIAGNOSIS — E7849 Other hyperlipidemia: Secondary | ICD-10-CM | POA: Diagnosis not present

## 2020-05-31 DIAGNOSIS — E569 Vitamin deficiency, unspecified: Secondary | ICD-10-CM | POA: Diagnosis not present

## 2020-05-31 DIAGNOSIS — R2681 Unsteadiness on feet: Secondary | ICD-10-CM | POA: Diagnosis not present

## 2020-05-31 DIAGNOSIS — R262 Difficulty in walking, not elsewhere classified: Secondary | ICD-10-CM | POA: Diagnosis not present

## 2020-06-01 DIAGNOSIS — F0390 Unspecified dementia without behavioral disturbance: Secondary | ICD-10-CM | POA: Diagnosis not present

## 2020-06-01 DIAGNOSIS — E039 Hypothyroidism, unspecified: Secondary | ICD-10-CM | POA: Diagnosis not present

## 2020-06-01 DIAGNOSIS — R262 Difficulty in walking, not elsewhere classified: Secondary | ICD-10-CM | POA: Diagnosis not present

## 2020-06-01 DIAGNOSIS — K219 Gastro-esophageal reflux disease without esophagitis: Secondary | ICD-10-CM | POA: Diagnosis not present

## 2020-06-01 DIAGNOSIS — E7849 Other hyperlipidemia: Secondary | ICD-10-CM | POA: Diagnosis not present

## 2020-06-01 DIAGNOSIS — E569 Vitamin deficiency, unspecified: Secondary | ICD-10-CM | POA: Diagnosis not present

## 2020-06-01 DIAGNOSIS — F3289 Other specified depressive episodes: Secondary | ICD-10-CM | POA: Diagnosis not present

## 2020-06-01 DIAGNOSIS — R2681 Unsteadiness on feet: Secondary | ICD-10-CM | POA: Diagnosis not present

## 2020-06-01 DIAGNOSIS — I10 Essential (primary) hypertension: Secondary | ICD-10-CM | POA: Diagnosis not present

## 2020-06-02 DIAGNOSIS — K219 Gastro-esophageal reflux disease without esophagitis: Secondary | ICD-10-CM | POA: Diagnosis not present

## 2020-06-02 DIAGNOSIS — F3289 Other specified depressive episodes: Secondary | ICD-10-CM | POA: Diagnosis not present

## 2020-06-02 DIAGNOSIS — I10 Essential (primary) hypertension: Secondary | ICD-10-CM | POA: Diagnosis not present

## 2020-06-02 DIAGNOSIS — R262 Difficulty in walking, not elsewhere classified: Secondary | ICD-10-CM | POA: Diagnosis not present

## 2020-06-02 DIAGNOSIS — E039 Hypothyroidism, unspecified: Secondary | ICD-10-CM | POA: Diagnosis not present

## 2020-06-02 DIAGNOSIS — R2681 Unsteadiness on feet: Secondary | ICD-10-CM | POA: Diagnosis not present

## 2020-06-02 DIAGNOSIS — E7849 Other hyperlipidemia: Secondary | ICD-10-CM | POA: Diagnosis not present

## 2020-06-02 DIAGNOSIS — E569 Vitamin deficiency, unspecified: Secondary | ICD-10-CM | POA: Diagnosis not present

## 2020-06-02 DIAGNOSIS — F0390 Unspecified dementia without behavioral disturbance: Secondary | ICD-10-CM | POA: Diagnosis not present

## 2020-06-03 DIAGNOSIS — I10 Essential (primary) hypertension: Secondary | ICD-10-CM | POA: Diagnosis not present

## 2020-06-03 DIAGNOSIS — F3289 Other specified depressive episodes: Secondary | ICD-10-CM | POA: Diagnosis not present

## 2020-06-03 DIAGNOSIS — R2681 Unsteadiness on feet: Secondary | ICD-10-CM | POA: Diagnosis not present

## 2020-06-03 DIAGNOSIS — E7849 Other hyperlipidemia: Secondary | ICD-10-CM | POA: Diagnosis not present

## 2020-06-03 DIAGNOSIS — E039 Hypothyroidism, unspecified: Secondary | ICD-10-CM | POA: Diagnosis not present

## 2020-06-03 DIAGNOSIS — E569 Vitamin deficiency, unspecified: Secondary | ICD-10-CM | POA: Diagnosis not present

## 2020-06-03 DIAGNOSIS — R262 Difficulty in walking, not elsewhere classified: Secondary | ICD-10-CM | POA: Diagnosis not present

## 2020-06-03 DIAGNOSIS — F0390 Unspecified dementia without behavioral disturbance: Secondary | ICD-10-CM | POA: Diagnosis not present

## 2020-06-03 DIAGNOSIS — K219 Gastro-esophageal reflux disease without esophagitis: Secondary | ICD-10-CM | POA: Diagnosis not present

## 2020-06-04 DIAGNOSIS — K219 Gastro-esophageal reflux disease without esophagitis: Secondary | ICD-10-CM | POA: Diagnosis not present

## 2020-06-04 DIAGNOSIS — R2681 Unsteadiness on feet: Secondary | ICD-10-CM | POA: Diagnosis not present

## 2020-06-04 DIAGNOSIS — F3289 Other specified depressive episodes: Secondary | ICD-10-CM | POA: Diagnosis not present

## 2020-06-04 DIAGNOSIS — E039 Hypothyroidism, unspecified: Secondary | ICD-10-CM | POA: Diagnosis not present

## 2020-06-04 DIAGNOSIS — E569 Vitamin deficiency, unspecified: Secondary | ICD-10-CM | POA: Diagnosis not present

## 2020-06-04 DIAGNOSIS — R262 Difficulty in walking, not elsewhere classified: Secondary | ICD-10-CM | POA: Diagnosis not present

## 2020-06-04 DIAGNOSIS — F0390 Unspecified dementia without behavioral disturbance: Secondary | ICD-10-CM | POA: Diagnosis not present

## 2020-06-04 DIAGNOSIS — I10 Essential (primary) hypertension: Secondary | ICD-10-CM | POA: Diagnosis not present

## 2020-06-04 DIAGNOSIS — E7849 Other hyperlipidemia: Secondary | ICD-10-CM | POA: Diagnosis not present

## 2020-06-07 DIAGNOSIS — K219 Gastro-esophageal reflux disease without esophagitis: Secondary | ICD-10-CM | POA: Diagnosis not present

## 2020-06-07 DIAGNOSIS — E7849 Other hyperlipidemia: Secondary | ICD-10-CM | POA: Diagnosis not present

## 2020-06-07 DIAGNOSIS — E039 Hypothyroidism, unspecified: Secondary | ICD-10-CM | POA: Diagnosis not present

## 2020-06-07 DIAGNOSIS — R2681 Unsteadiness on feet: Secondary | ICD-10-CM | POA: Diagnosis not present

## 2020-06-07 DIAGNOSIS — R262 Difficulty in walking, not elsewhere classified: Secondary | ICD-10-CM | POA: Diagnosis not present

## 2020-06-07 DIAGNOSIS — F3289 Other specified depressive episodes: Secondary | ICD-10-CM | POA: Diagnosis not present

## 2020-06-07 DIAGNOSIS — I10 Essential (primary) hypertension: Secondary | ICD-10-CM | POA: Diagnosis not present

## 2020-06-07 DIAGNOSIS — E569 Vitamin deficiency, unspecified: Secondary | ICD-10-CM | POA: Diagnosis not present

## 2020-06-07 DIAGNOSIS — F0390 Unspecified dementia without behavioral disturbance: Secondary | ICD-10-CM | POA: Diagnosis not present

## 2020-06-08 DIAGNOSIS — E7849 Other hyperlipidemia: Secondary | ICD-10-CM | POA: Diagnosis not present

## 2020-06-08 DIAGNOSIS — E039 Hypothyroidism, unspecified: Secondary | ICD-10-CM | POA: Diagnosis not present

## 2020-06-08 DIAGNOSIS — F3289 Other specified depressive episodes: Secondary | ICD-10-CM | POA: Diagnosis not present

## 2020-06-08 DIAGNOSIS — R339 Retention of urine, unspecified: Secondary | ICD-10-CM | POA: Diagnosis not present

## 2020-06-08 DIAGNOSIS — R451 Restlessness and agitation: Secondary | ICD-10-CM | POA: Diagnosis not present

## 2020-06-08 DIAGNOSIS — G9009 Other idiopathic peripheral autonomic neuropathy: Secondary | ICD-10-CM | POA: Diagnosis not present

## 2020-06-08 DIAGNOSIS — M797 Fibromyalgia: Secondary | ICD-10-CM | POA: Diagnosis not present

## 2020-06-08 DIAGNOSIS — E569 Vitamin deficiency, unspecified: Secondary | ICD-10-CM | POA: Diagnosis not present

## 2020-06-08 DIAGNOSIS — K219 Gastro-esophageal reflux disease without esophagitis: Secondary | ICD-10-CM | POA: Diagnosis not present

## 2020-06-08 DIAGNOSIS — R262 Difficulty in walking, not elsewhere classified: Secondary | ICD-10-CM | POA: Diagnosis not present

## 2020-06-08 DIAGNOSIS — I10 Essential (primary) hypertension: Secondary | ICD-10-CM | POA: Diagnosis not present

## 2020-06-08 DIAGNOSIS — F0390 Unspecified dementia without behavioral disturbance: Secondary | ICD-10-CM | POA: Diagnosis not present

## 2020-06-08 DIAGNOSIS — R2681 Unsteadiness on feet: Secondary | ICD-10-CM | POA: Diagnosis not present

## 2020-06-08 DIAGNOSIS — G4701 Insomnia due to medical condition: Secondary | ICD-10-CM | POA: Diagnosis not present

## 2020-06-09 DIAGNOSIS — F3289 Other specified depressive episodes: Secondary | ICD-10-CM | POA: Diagnosis not present

## 2020-06-09 DIAGNOSIS — R262 Difficulty in walking, not elsewhere classified: Secondary | ICD-10-CM | POA: Diagnosis not present

## 2020-06-09 DIAGNOSIS — K219 Gastro-esophageal reflux disease without esophagitis: Secondary | ICD-10-CM | POA: Diagnosis not present

## 2020-06-09 DIAGNOSIS — R2681 Unsteadiness on feet: Secondary | ICD-10-CM | POA: Diagnosis not present

## 2020-06-09 DIAGNOSIS — E7849 Other hyperlipidemia: Secondary | ICD-10-CM | POA: Diagnosis not present

## 2020-06-09 DIAGNOSIS — F0390 Unspecified dementia without behavioral disturbance: Secondary | ICD-10-CM | POA: Diagnosis not present

## 2020-06-09 DIAGNOSIS — E039 Hypothyroidism, unspecified: Secondary | ICD-10-CM | POA: Diagnosis not present

## 2020-06-09 DIAGNOSIS — E569 Vitamin deficiency, unspecified: Secondary | ICD-10-CM | POA: Diagnosis not present

## 2020-06-09 DIAGNOSIS — I10 Essential (primary) hypertension: Secondary | ICD-10-CM | POA: Diagnosis not present

## 2020-06-10 DIAGNOSIS — R262 Difficulty in walking, not elsewhere classified: Secondary | ICD-10-CM | POA: Diagnosis not present

## 2020-06-10 DIAGNOSIS — F3289 Other specified depressive episodes: Secondary | ICD-10-CM | POA: Diagnosis not present

## 2020-06-10 DIAGNOSIS — F0390 Unspecified dementia without behavioral disturbance: Secondary | ICD-10-CM | POA: Diagnosis not present

## 2020-06-10 DIAGNOSIS — E569 Vitamin deficiency, unspecified: Secondary | ICD-10-CM | POA: Diagnosis not present

## 2020-06-10 DIAGNOSIS — I10 Essential (primary) hypertension: Secondary | ICD-10-CM | POA: Diagnosis not present

## 2020-06-10 DIAGNOSIS — R2681 Unsteadiness on feet: Secondary | ICD-10-CM | POA: Diagnosis not present

## 2020-06-10 DIAGNOSIS — K219 Gastro-esophageal reflux disease without esophagitis: Secondary | ICD-10-CM | POA: Diagnosis not present

## 2020-06-10 DIAGNOSIS — E7849 Other hyperlipidemia: Secondary | ICD-10-CM | POA: Diagnosis not present

## 2020-06-10 DIAGNOSIS — E039 Hypothyroidism, unspecified: Secondary | ICD-10-CM | POA: Diagnosis not present

## 2020-06-11 DIAGNOSIS — M6281 Muscle weakness (generalized): Secondary | ICD-10-CM | POA: Diagnosis not present

## 2020-06-11 DIAGNOSIS — R262 Difficulty in walking, not elsewhere classified: Secondary | ICD-10-CM | POA: Diagnosis not present

## 2020-06-14 DIAGNOSIS — M6281 Muscle weakness (generalized): Secondary | ICD-10-CM | POA: Diagnosis not present

## 2020-06-14 DIAGNOSIS — R262 Difficulty in walking, not elsewhere classified: Secondary | ICD-10-CM | POA: Diagnosis not present

## 2020-06-15 DIAGNOSIS — E569 Vitamin deficiency, unspecified: Secondary | ICD-10-CM | POA: Diagnosis not present

## 2020-06-15 DIAGNOSIS — Z23 Encounter for immunization: Secondary | ICD-10-CM | POA: Diagnosis not present

## 2020-06-15 DIAGNOSIS — E7849 Other hyperlipidemia: Secondary | ICD-10-CM | POA: Diagnosis not present

## 2020-06-15 DIAGNOSIS — K219 Gastro-esophageal reflux disease without esophagitis: Secondary | ICD-10-CM | POA: Diagnosis not present

## 2020-06-15 DIAGNOSIS — E039 Hypothyroidism, unspecified: Secondary | ICD-10-CM | POA: Diagnosis not present

## 2020-06-15 DIAGNOSIS — F3289 Other specified depressive episodes: Secondary | ICD-10-CM | POA: Diagnosis not present

## 2020-06-15 DIAGNOSIS — I10 Essential (primary) hypertension: Secondary | ICD-10-CM | POA: Diagnosis not present

## 2020-06-15 DIAGNOSIS — M6281 Muscle weakness (generalized): Secondary | ICD-10-CM | POA: Diagnosis not present

## 2020-06-15 DIAGNOSIS — R262 Difficulty in walking, not elsewhere classified: Secondary | ICD-10-CM | POA: Diagnosis not present

## 2020-06-16 DIAGNOSIS — M6281 Muscle weakness (generalized): Secondary | ICD-10-CM | POA: Diagnosis not present

## 2020-06-16 DIAGNOSIS — R262 Difficulty in walking, not elsewhere classified: Secondary | ICD-10-CM | POA: Diagnosis not present

## 2020-06-17 DIAGNOSIS — M6281 Muscle weakness (generalized): Secondary | ICD-10-CM | POA: Diagnosis not present

## 2020-06-17 DIAGNOSIS — R262 Difficulty in walking, not elsewhere classified: Secondary | ICD-10-CM | POA: Diagnosis not present

## 2020-06-18 DIAGNOSIS — R262 Difficulty in walking, not elsewhere classified: Secondary | ICD-10-CM | POA: Diagnosis not present

## 2020-06-18 DIAGNOSIS — M6281 Muscle weakness (generalized): Secondary | ICD-10-CM | POA: Diagnosis not present

## 2020-07-13 DIAGNOSIS — I10 Essential (primary) hypertension: Secondary | ICD-10-CM | POA: Diagnosis not present

## 2020-07-13 DIAGNOSIS — F331 Major depressive disorder, recurrent, moderate: Secondary | ICD-10-CM | POA: Diagnosis not present

## 2020-07-13 DIAGNOSIS — R451 Restlessness and agitation: Secondary | ICD-10-CM | POA: Diagnosis not present

## 2020-07-13 DIAGNOSIS — E039 Hypothyroidism, unspecified: Secondary | ICD-10-CM | POA: Diagnosis not present

## 2020-07-13 DIAGNOSIS — G4701 Insomnia due to medical condition: Secondary | ICD-10-CM | POA: Diagnosis not present

## 2020-07-13 DIAGNOSIS — M792 Neuralgia and neuritis, unspecified: Secondary | ICD-10-CM | POA: Diagnosis not present

## 2020-07-13 DIAGNOSIS — F0390 Unspecified dementia without behavioral disturbance: Secondary | ICD-10-CM | POA: Diagnosis not present

## 2020-08-16 DIAGNOSIS — F0390 Unspecified dementia without behavioral disturbance: Secondary | ICD-10-CM | POA: Diagnosis not present

## 2020-08-16 DIAGNOSIS — E569 Vitamin deficiency, unspecified: Secondary | ICD-10-CM | POA: Diagnosis not present

## 2020-08-16 DIAGNOSIS — U071 COVID-19: Secondary | ICD-10-CM | POA: Diagnosis not present

## 2020-08-16 DIAGNOSIS — E7849 Other hyperlipidemia: Secondary | ICD-10-CM | POA: Diagnosis not present

## 2020-08-16 DIAGNOSIS — E039 Hypothyroidism, unspecified: Secondary | ICD-10-CM | POA: Diagnosis not present

## 2020-08-16 DIAGNOSIS — R2681 Unsteadiness on feet: Secondary | ICD-10-CM | POA: Diagnosis not present

## 2020-08-16 DIAGNOSIS — I10 Essential (primary) hypertension: Secondary | ICD-10-CM | POA: Diagnosis not present

## 2020-08-16 DIAGNOSIS — F3289 Other specified depressive episodes: Secondary | ICD-10-CM | POA: Diagnosis not present

## 2020-08-16 DIAGNOSIS — K219 Gastro-esophageal reflux disease without esophagitis: Secondary | ICD-10-CM | POA: Diagnosis not present

## 2020-08-16 DIAGNOSIS — M792 Neuralgia and neuritis, unspecified: Secondary | ICD-10-CM | POA: Diagnosis not present

## 2020-08-17 DIAGNOSIS — R451 Restlessness and agitation: Secondary | ICD-10-CM | POA: Diagnosis not present

## 2020-08-17 DIAGNOSIS — G4701 Insomnia due to medical condition: Secondary | ICD-10-CM | POA: Diagnosis not present

## 2020-08-17 DIAGNOSIS — F0390 Unspecified dementia without behavioral disturbance: Secondary | ICD-10-CM | POA: Diagnosis not present

## 2020-08-17 DIAGNOSIS — F331 Major depressive disorder, recurrent, moderate: Secondary | ICD-10-CM | POA: Diagnosis not present

## 2020-08-19 DIAGNOSIS — K219 Gastro-esophageal reflux disease without esophagitis: Secondary | ICD-10-CM | POA: Diagnosis not present

## 2020-08-19 DIAGNOSIS — I10 Essential (primary) hypertension: Secondary | ICD-10-CM | POA: Diagnosis not present

## 2020-08-19 DIAGNOSIS — E039 Hypothyroidism, unspecified: Secondary | ICD-10-CM | POA: Diagnosis not present

## 2020-08-19 DIAGNOSIS — E569 Vitamin deficiency, unspecified: Secondary | ICD-10-CM | POA: Diagnosis not present

## 2020-08-19 DIAGNOSIS — U071 COVID-19: Secondary | ICD-10-CM | POA: Diagnosis not present

## 2020-08-19 DIAGNOSIS — F3289 Other specified depressive episodes: Secondary | ICD-10-CM | POA: Diagnosis not present

## 2020-08-19 DIAGNOSIS — R2681 Unsteadiness on feet: Secondary | ICD-10-CM | POA: Diagnosis not present

## 2020-08-19 DIAGNOSIS — F0390 Unspecified dementia without behavioral disturbance: Secondary | ICD-10-CM | POA: Diagnosis not present

## 2020-08-19 DIAGNOSIS — E7849 Other hyperlipidemia: Secondary | ICD-10-CM | POA: Diagnosis not present

## 2020-08-20 DIAGNOSIS — U071 COVID-19: Secondary | ICD-10-CM | POA: Diagnosis not present

## 2020-08-20 DIAGNOSIS — F3289 Other specified depressive episodes: Secondary | ICD-10-CM | POA: Diagnosis not present

## 2020-08-20 DIAGNOSIS — K219 Gastro-esophageal reflux disease without esophagitis: Secondary | ICD-10-CM | POA: Diagnosis not present

## 2020-08-20 DIAGNOSIS — I10 Essential (primary) hypertension: Secondary | ICD-10-CM | POA: Diagnosis not present

## 2020-08-20 DIAGNOSIS — R2681 Unsteadiness on feet: Secondary | ICD-10-CM | POA: Diagnosis not present

## 2020-08-20 DIAGNOSIS — E7849 Other hyperlipidemia: Secondary | ICD-10-CM | POA: Diagnosis not present

## 2020-08-20 DIAGNOSIS — E569 Vitamin deficiency, unspecified: Secondary | ICD-10-CM | POA: Diagnosis not present

## 2020-08-20 DIAGNOSIS — F0390 Unspecified dementia without behavioral disturbance: Secondary | ICD-10-CM | POA: Diagnosis not present

## 2020-08-20 DIAGNOSIS — E039 Hypothyroidism, unspecified: Secondary | ICD-10-CM | POA: Diagnosis not present

## 2020-08-22 DIAGNOSIS — K219 Gastro-esophageal reflux disease without esophagitis: Secondary | ICD-10-CM | POA: Diagnosis not present

## 2020-08-22 DIAGNOSIS — R2681 Unsteadiness on feet: Secondary | ICD-10-CM | POA: Diagnosis not present

## 2020-08-22 DIAGNOSIS — E569 Vitamin deficiency, unspecified: Secondary | ICD-10-CM | POA: Diagnosis not present

## 2020-08-22 DIAGNOSIS — U071 COVID-19: Secondary | ICD-10-CM | POA: Diagnosis not present

## 2020-08-22 DIAGNOSIS — E7849 Other hyperlipidemia: Secondary | ICD-10-CM | POA: Diagnosis not present

## 2020-08-22 DIAGNOSIS — E039 Hypothyroidism, unspecified: Secondary | ICD-10-CM | POA: Diagnosis not present

## 2020-08-22 DIAGNOSIS — F3289 Other specified depressive episodes: Secondary | ICD-10-CM | POA: Diagnosis not present

## 2020-08-22 DIAGNOSIS — I10 Essential (primary) hypertension: Secondary | ICD-10-CM | POA: Diagnosis not present

## 2020-08-22 DIAGNOSIS — F0390 Unspecified dementia without behavioral disturbance: Secondary | ICD-10-CM | POA: Diagnosis not present

## 2020-08-23 DIAGNOSIS — I10 Essential (primary) hypertension: Secondary | ICD-10-CM | POA: Diagnosis not present

## 2020-08-23 DIAGNOSIS — E039 Hypothyroidism, unspecified: Secondary | ICD-10-CM | POA: Diagnosis not present

## 2020-08-23 DIAGNOSIS — R2681 Unsteadiness on feet: Secondary | ICD-10-CM | POA: Diagnosis not present

## 2020-08-23 DIAGNOSIS — F3289 Other specified depressive episodes: Secondary | ICD-10-CM | POA: Diagnosis not present

## 2020-08-23 DIAGNOSIS — E569 Vitamin deficiency, unspecified: Secondary | ICD-10-CM | POA: Diagnosis not present

## 2020-08-23 DIAGNOSIS — E7849 Other hyperlipidemia: Secondary | ICD-10-CM | POA: Diagnosis not present

## 2020-08-23 DIAGNOSIS — K219 Gastro-esophageal reflux disease without esophagitis: Secondary | ICD-10-CM | POA: Diagnosis not present

## 2020-08-23 DIAGNOSIS — U071 COVID-19: Secondary | ICD-10-CM | POA: Diagnosis not present

## 2020-08-23 DIAGNOSIS — F0390 Unspecified dementia without behavioral disturbance: Secondary | ICD-10-CM | POA: Diagnosis not present

## 2020-08-24 DIAGNOSIS — I10 Essential (primary) hypertension: Secondary | ICD-10-CM | POA: Diagnosis not present

## 2020-08-24 DIAGNOSIS — F0390 Unspecified dementia without behavioral disturbance: Secondary | ICD-10-CM | POA: Diagnosis not present

## 2020-08-24 DIAGNOSIS — F3289 Other specified depressive episodes: Secondary | ICD-10-CM | POA: Diagnosis not present

## 2020-08-24 DIAGNOSIS — R2681 Unsteadiness on feet: Secondary | ICD-10-CM | POA: Diagnosis not present

## 2020-08-24 DIAGNOSIS — E7849 Other hyperlipidemia: Secondary | ICD-10-CM | POA: Diagnosis not present

## 2020-08-24 DIAGNOSIS — U071 COVID-19: Secondary | ICD-10-CM | POA: Diagnosis not present

## 2020-08-24 DIAGNOSIS — E039 Hypothyroidism, unspecified: Secondary | ICD-10-CM | POA: Diagnosis not present

## 2020-08-24 DIAGNOSIS — E569 Vitamin deficiency, unspecified: Secondary | ICD-10-CM | POA: Diagnosis not present

## 2020-08-24 DIAGNOSIS — K219 Gastro-esophageal reflux disease without esophagitis: Secondary | ICD-10-CM | POA: Diagnosis not present

## 2020-08-25 DIAGNOSIS — E569 Vitamin deficiency, unspecified: Secondary | ICD-10-CM | POA: Diagnosis not present

## 2020-08-25 DIAGNOSIS — F3289 Other specified depressive episodes: Secondary | ICD-10-CM | POA: Diagnosis not present

## 2020-08-25 DIAGNOSIS — E039 Hypothyroidism, unspecified: Secondary | ICD-10-CM | POA: Diagnosis not present

## 2020-08-25 DIAGNOSIS — R2681 Unsteadiness on feet: Secondary | ICD-10-CM | POA: Diagnosis not present

## 2020-08-25 DIAGNOSIS — E7849 Other hyperlipidemia: Secondary | ICD-10-CM | POA: Diagnosis not present

## 2020-08-25 DIAGNOSIS — U071 COVID-19: Secondary | ICD-10-CM | POA: Diagnosis not present

## 2020-08-25 DIAGNOSIS — K219 Gastro-esophageal reflux disease without esophagitis: Secondary | ICD-10-CM | POA: Diagnosis not present

## 2020-08-25 DIAGNOSIS — I10 Essential (primary) hypertension: Secondary | ICD-10-CM | POA: Diagnosis not present

## 2020-08-25 DIAGNOSIS — F0390 Unspecified dementia without behavioral disturbance: Secondary | ICD-10-CM | POA: Diagnosis not present

## 2020-08-26 DIAGNOSIS — R2681 Unsteadiness on feet: Secondary | ICD-10-CM | POA: Diagnosis not present

## 2020-08-26 DIAGNOSIS — E039 Hypothyroidism, unspecified: Secondary | ICD-10-CM | POA: Diagnosis not present

## 2020-08-26 DIAGNOSIS — E7849 Other hyperlipidemia: Secondary | ICD-10-CM | POA: Diagnosis not present

## 2020-08-26 DIAGNOSIS — E569 Vitamin deficiency, unspecified: Secondary | ICD-10-CM | POA: Diagnosis not present

## 2020-08-26 DIAGNOSIS — U071 COVID-19: Secondary | ICD-10-CM | POA: Diagnosis not present

## 2020-08-26 DIAGNOSIS — I10 Essential (primary) hypertension: Secondary | ICD-10-CM | POA: Diagnosis not present

## 2020-08-26 DIAGNOSIS — K219 Gastro-esophageal reflux disease without esophagitis: Secondary | ICD-10-CM | POA: Diagnosis not present

## 2020-08-26 DIAGNOSIS — F3289 Other specified depressive episodes: Secondary | ICD-10-CM | POA: Diagnosis not present

## 2020-08-26 DIAGNOSIS — F0390 Unspecified dementia without behavioral disturbance: Secondary | ICD-10-CM | POA: Diagnosis not present

## 2020-08-27 DIAGNOSIS — E569 Vitamin deficiency, unspecified: Secondary | ICD-10-CM | POA: Diagnosis not present

## 2020-08-27 DIAGNOSIS — F0390 Unspecified dementia without behavioral disturbance: Secondary | ICD-10-CM | POA: Diagnosis not present

## 2020-08-27 DIAGNOSIS — U071 COVID-19: Secondary | ICD-10-CM | POA: Diagnosis not present

## 2020-08-27 DIAGNOSIS — R2681 Unsteadiness on feet: Secondary | ICD-10-CM | POA: Diagnosis not present

## 2020-08-27 DIAGNOSIS — E039 Hypothyroidism, unspecified: Secondary | ICD-10-CM | POA: Diagnosis not present

## 2020-08-27 DIAGNOSIS — E7849 Other hyperlipidemia: Secondary | ICD-10-CM | POA: Diagnosis not present

## 2020-08-27 DIAGNOSIS — I10 Essential (primary) hypertension: Secondary | ICD-10-CM | POA: Diagnosis not present

## 2020-08-27 DIAGNOSIS — K219 Gastro-esophageal reflux disease without esophagitis: Secondary | ICD-10-CM | POA: Diagnosis not present

## 2020-08-27 DIAGNOSIS — F3289 Other specified depressive episodes: Secondary | ICD-10-CM | POA: Diagnosis not present

## 2020-08-30 DIAGNOSIS — E7849 Other hyperlipidemia: Secondary | ICD-10-CM | POA: Diagnosis not present

## 2020-08-30 DIAGNOSIS — F0390 Unspecified dementia without behavioral disturbance: Secondary | ICD-10-CM | POA: Diagnosis not present

## 2020-08-30 DIAGNOSIS — U071 COVID-19: Secondary | ICD-10-CM | POA: Diagnosis not present

## 2020-08-30 DIAGNOSIS — R2681 Unsteadiness on feet: Secondary | ICD-10-CM | POA: Diagnosis not present

## 2020-08-30 DIAGNOSIS — E039 Hypothyroidism, unspecified: Secondary | ICD-10-CM | POA: Diagnosis not present

## 2020-08-30 DIAGNOSIS — K219 Gastro-esophageal reflux disease without esophagitis: Secondary | ICD-10-CM | POA: Diagnosis not present

## 2020-08-30 DIAGNOSIS — I10 Essential (primary) hypertension: Secondary | ICD-10-CM | POA: Diagnosis not present

## 2020-08-30 DIAGNOSIS — E569 Vitamin deficiency, unspecified: Secondary | ICD-10-CM | POA: Diagnosis not present

## 2020-08-30 DIAGNOSIS — F3289 Other specified depressive episodes: Secondary | ICD-10-CM | POA: Diagnosis not present

## 2020-08-31 DIAGNOSIS — I10 Essential (primary) hypertension: Secondary | ICD-10-CM | POA: Diagnosis not present

## 2020-08-31 DIAGNOSIS — E7849 Other hyperlipidemia: Secondary | ICD-10-CM | POA: Diagnosis not present

## 2020-08-31 DIAGNOSIS — K219 Gastro-esophageal reflux disease without esophagitis: Secondary | ICD-10-CM | POA: Diagnosis not present

## 2020-08-31 DIAGNOSIS — E039 Hypothyroidism, unspecified: Secondary | ICD-10-CM | POA: Diagnosis not present

## 2020-08-31 DIAGNOSIS — U071 COVID-19: Secondary | ICD-10-CM | POA: Diagnosis not present

## 2020-08-31 DIAGNOSIS — F3289 Other specified depressive episodes: Secondary | ICD-10-CM | POA: Diagnosis not present

## 2020-08-31 DIAGNOSIS — E569 Vitamin deficiency, unspecified: Secondary | ICD-10-CM | POA: Diagnosis not present

## 2020-08-31 DIAGNOSIS — R2681 Unsteadiness on feet: Secondary | ICD-10-CM | POA: Diagnosis not present

## 2020-08-31 DIAGNOSIS — F0390 Unspecified dementia without behavioral disturbance: Secondary | ICD-10-CM | POA: Diagnosis not present

## 2020-09-01 DIAGNOSIS — E039 Hypothyroidism, unspecified: Secondary | ICD-10-CM | POA: Diagnosis not present

## 2020-09-01 DIAGNOSIS — E7849 Other hyperlipidemia: Secondary | ICD-10-CM | POA: Diagnosis not present

## 2020-09-01 DIAGNOSIS — R2681 Unsteadiness on feet: Secondary | ICD-10-CM | POA: Diagnosis not present

## 2020-09-01 DIAGNOSIS — F3289 Other specified depressive episodes: Secondary | ICD-10-CM | POA: Diagnosis not present

## 2020-09-01 DIAGNOSIS — U071 COVID-19: Secondary | ICD-10-CM | POA: Diagnosis not present

## 2020-09-01 DIAGNOSIS — E569 Vitamin deficiency, unspecified: Secondary | ICD-10-CM | POA: Diagnosis not present

## 2020-09-01 DIAGNOSIS — K219 Gastro-esophageal reflux disease without esophagitis: Secondary | ICD-10-CM | POA: Diagnosis not present

## 2020-09-01 DIAGNOSIS — F0390 Unspecified dementia without behavioral disturbance: Secondary | ICD-10-CM | POA: Diagnosis not present

## 2020-09-01 DIAGNOSIS — I10 Essential (primary) hypertension: Secondary | ICD-10-CM | POA: Diagnosis not present

## 2020-09-02 DIAGNOSIS — R079 Chest pain, unspecified: Secondary | ICD-10-CM | POA: Diagnosis not present

## 2020-09-02 DIAGNOSIS — J45909 Unspecified asthma, uncomplicated: Secondary | ICD-10-CM | POA: Diagnosis not present

## 2020-09-02 DIAGNOSIS — E039 Hypothyroidism, unspecified: Secondary | ICD-10-CM | POA: Diagnosis not present

## 2020-09-02 DIAGNOSIS — K2270 Barrett's esophagus without dysplasia: Secondary | ICD-10-CM | POA: Diagnosis not present

## 2020-09-02 DIAGNOSIS — I10 Essential (primary) hypertension: Secondary | ICD-10-CM | POA: Diagnosis not present

## 2020-09-03 DIAGNOSIS — E569 Vitamin deficiency, unspecified: Secondary | ICD-10-CM | POA: Diagnosis not present

## 2020-09-03 DIAGNOSIS — R2681 Unsteadiness on feet: Secondary | ICD-10-CM | POA: Diagnosis not present

## 2020-09-03 DIAGNOSIS — E039 Hypothyroidism, unspecified: Secondary | ICD-10-CM | POA: Diagnosis not present

## 2020-09-03 DIAGNOSIS — U071 COVID-19: Secondary | ICD-10-CM | POA: Diagnosis not present

## 2020-09-03 DIAGNOSIS — K219 Gastro-esophageal reflux disease without esophagitis: Secondary | ICD-10-CM | POA: Diagnosis not present

## 2020-09-03 DIAGNOSIS — E7849 Other hyperlipidemia: Secondary | ICD-10-CM | POA: Diagnosis not present

## 2020-09-03 DIAGNOSIS — F3289 Other specified depressive episodes: Secondary | ICD-10-CM | POA: Diagnosis not present

## 2020-09-03 DIAGNOSIS — F0390 Unspecified dementia without behavioral disturbance: Secondary | ICD-10-CM | POA: Diagnosis not present

## 2020-09-03 DIAGNOSIS — I10 Essential (primary) hypertension: Secondary | ICD-10-CM | POA: Diagnosis not present

## 2020-09-06 DIAGNOSIS — K2270 Barrett's esophagus without dysplasia: Secondary | ICD-10-CM | POA: Diagnosis not present

## 2020-09-06 DIAGNOSIS — R079 Chest pain, unspecified: Secondary | ICD-10-CM | POA: Diagnosis not present

## 2020-09-06 DIAGNOSIS — E039 Hypothyroidism, unspecified: Secondary | ICD-10-CM | POA: Diagnosis not present

## 2020-09-06 DIAGNOSIS — I10 Essential (primary) hypertension: Secondary | ICD-10-CM | POA: Diagnosis not present

## 2020-09-06 DIAGNOSIS — U071 COVID-19: Secondary | ICD-10-CM | POA: Diagnosis not present

## 2020-09-06 DIAGNOSIS — J45909 Unspecified asthma, uncomplicated: Secondary | ICD-10-CM | POA: Diagnosis not present

## 2020-09-07 DIAGNOSIS — F0390 Unspecified dementia without behavioral disturbance: Secondary | ICD-10-CM | POA: Diagnosis not present

## 2020-09-07 DIAGNOSIS — U071 COVID-19: Secondary | ICD-10-CM | POA: Diagnosis not present

## 2020-09-07 DIAGNOSIS — E039 Hypothyroidism, unspecified: Secondary | ICD-10-CM | POA: Diagnosis not present

## 2020-09-07 DIAGNOSIS — E7849 Other hyperlipidemia: Secondary | ICD-10-CM | POA: Diagnosis not present

## 2020-09-07 DIAGNOSIS — K219 Gastro-esophageal reflux disease without esophagitis: Secondary | ICD-10-CM | POA: Diagnosis not present

## 2020-09-07 DIAGNOSIS — E569 Vitamin deficiency, unspecified: Secondary | ICD-10-CM | POA: Diagnosis not present

## 2020-09-07 DIAGNOSIS — F3289 Other specified depressive episodes: Secondary | ICD-10-CM | POA: Diagnosis not present

## 2020-09-07 DIAGNOSIS — I10 Essential (primary) hypertension: Secondary | ICD-10-CM | POA: Diagnosis not present

## 2020-09-07 DIAGNOSIS — R2681 Unsteadiness on feet: Secondary | ICD-10-CM | POA: Diagnosis not present

## 2020-09-08 DIAGNOSIS — E7849 Other hyperlipidemia: Secondary | ICD-10-CM | POA: Diagnosis not present

## 2020-09-08 DIAGNOSIS — F3289 Other specified depressive episodes: Secondary | ICD-10-CM | POA: Diagnosis not present

## 2020-09-08 DIAGNOSIS — R2681 Unsteadiness on feet: Secondary | ICD-10-CM | POA: Diagnosis not present

## 2020-09-08 DIAGNOSIS — K219 Gastro-esophageal reflux disease without esophagitis: Secondary | ICD-10-CM | POA: Diagnosis not present

## 2020-09-08 DIAGNOSIS — U071 COVID-19: Secondary | ICD-10-CM | POA: Diagnosis not present

## 2020-09-08 DIAGNOSIS — E569 Vitamin deficiency, unspecified: Secondary | ICD-10-CM | POA: Diagnosis not present

## 2020-09-08 DIAGNOSIS — E039 Hypothyroidism, unspecified: Secondary | ICD-10-CM | POA: Diagnosis not present

## 2020-09-08 DIAGNOSIS — I10 Essential (primary) hypertension: Secondary | ICD-10-CM | POA: Diagnosis not present

## 2020-09-08 DIAGNOSIS — F0390 Unspecified dementia without behavioral disturbance: Secondary | ICD-10-CM | POA: Diagnosis not present

## 2020-09-14 DIAGNOSIS — K2270 Barrett's esophagus without dysplasia: Secondary | ICD-10-CM | POA: Diagnosis not present

## 2020-09-14 DIAGNOSIS — F0390 Unspecified dementia without behavioral disturbance: Secondary | ICD-10-CM | POA: Diagnosis not present

## 2020-09-14 DIAGNOSIS — F3289 Other specified depressive episodes: Secondary | ICD-10-CM | POA: Diagnosis not present

## 2020-09-14 DIAGNOSIS — I10 Essential (primary) hypertension: Secondary | ICD-10-CM | POA: Diagnosis not present

## 2020-09-14 DIAGNOSIS — U071 COVID-19: Secondary | ICD-10-CM | POA: Diagnosis not present

## 2020-09-14 DIAGNOSIS — R079 Chest pain, unspecified: Secondary | ICD-10-CM | POA: Diagnosis not present

## 2020-09-28 DIAGNOSIS — F331 Major depressive disorder, recurrent, moderate: Secondary | ICD-10-CM | POA: Diagnosis not present

## 2020-09-28 DIAGNOSIS — G9009 Other idiopathic peripheral autonomic neuropathy: Secondary | ICD-10-CM | POA: Diagnosis not present

## 2020-09-28 DIAGNOSIS — F0390 Unspecified dementia without behavioral disturbance: Secondary | ICD-10-CM | POA: Diagnosis not present

## 2020-10-06 DIAGNOSIS — K2270 Barrett's esophagus without dysplasia: Secondary | ICD-10-CM | POA: Diagnosis not present

## 2020-10-06 DIAGNOSIS — I10 Essential (primary) hypertension: Secondary | ICD-10-CM | POA: Diagnosis not present

## 2020-10-06 DIAGNOSIS — R531 Weakness: Secondary | ICD-10-CM | POA: Diagnosis not present

## 2020-10-06 DIAGNOSIS — F0390 Unspecified dementia without behavioral disturbance: Secondary | ICD-10-CM | POA: Diagnosis not present

## 2020-10-06 DIAGNOSIS — M797 Fibromyalgia: Secondary | ICD-10-CM | POA: Diagnosis not present

## 2020-10-13 DIAGNOSIS — I110 Hypertensive heart disease with heart failure: Secondary | ICD-10-CM | POA: Diagnosis not present

## 2020-10-13 DIAGNOSIS — I959 Hypotension, unspecified: Secondary | ICD-10-CM | POA: Diagnosis not present

## 2020-10-13 DIAGNOSIS — F0390 Unspecified dementia without behavioral disturbance: Secondary | ICD-10-CM | POA: Diagnosis not present

## 2020-10-13 DIAGNOSIS — R531 Weakness: Secondary | ICD-10-CM | POA: Diagnosis not present

## 2020-10-14 DIAGNOSIS — E869 Volume depletion, unspecified: Secondary | ICD-10-CM | POA: Diagnosis not present

## 2020-10-14 DIAGNOSIS — I959 Hypotension, unspecified: Secondary | ICD-10-CM | POA: Diagnosis not present

## 2020-10-14 DIAGNOSIS — F0390 Unspecified dementia without behavioral disturbance: Secondary | ICD-10-CM | POA: Diagnosis not present

## 2020-10-14 DIAGNOSIS — R531 Weakness: Secondary | ICD-10-CM | POA: Diagnosis not present

## 2020-10-19 DIAGNOSIS — J45909 Unspecified asthma, uncomplicated: Secondary | ICD-10-CM | POA: Diagnosis not present

## 2020-10-19 DIAGNOSIS — F0390 Unspecified dementia without behavioral disturbance: Secondary | ICD-10-CM | POA: Diagnosis not present

## 2020-10-19 DIAGNOSIS — I959 Hypotension, unspecified: Secondary | ICD-10-CM | POA: Diagnosis not present

## 2020-10-19 DIAGNOSIS — N183 Chronic kidney disease, stage 3 unspecified: Secondary | ICD-10-CM | POA: Diagnosis not present

## 2020-10-19 DIAGNOSIS — G8929 Other chronic pain: Secondary | ICD-10-CM | POA: Diagnosis not present

## 2020-10-19 DIAGNOSIS — M6281 Muscle weakness (generalized): Secondary | ICD-10-CM | POA: Diagnosis not present

## 2020-10-19 DIAGNOSIS — K219 Gastro-esophageal reflux disease without esophagitis: Secondary | ICD-10-CM | POA: Diagnosis not present

## 2020-10-19 DIAGNOSIS — D649 Anemia, unspecified: Secondary | ICD-10-CM | POA: Diagnosis not present

## 2020-10-19 DIAGNOSIS — E039 Hypothyroidism, unspecified: Secondary | ICD-10-CM | POA: Diagnosis not present

## 2020-10-28 DIAGNOSIS — F0390 Unspecified dementia without behavioral disturbance: Secondary | ICD-10-CM | POA: Diagnosis not present

## 2020-10-28 DIAGNOSIS — R410 Disorientation, unspecified: Secondary | ICD-10-CM | POA: Diagnosis not present

## 2020-10-29 DIAGNOSIS — N390 Urinary tract infection, site not specified: Secondary | ICD-10-CM | POA: Diagnosis not present

## 2020-11-02 DIAGNOSIS — N390 Urinary tract infection, site not specified: Secondary | ICD-10-CM | POA: Diagnosis not present

## 2020-11-02 DIAGNOSIS — F331 Major depressive disorder, recurrent, moderate: Secondary | ICD-10-CM | POA: Diagnosis not present

## 2020-11-02 DIAGNOSIS — F0390 Unspecified dementia without behavioral disturbance: Secondary | ICD-10-CM | POA: Diagnosis not present

## 2020-11-02 DIAGNOSIS — G4701 Insomnia due to medical condition: Secondary | ICD-10-CM | POA: Diagnosis not present

## 2020-11-04 DIAGNOSIS — F0390 Unspecified dementia without behavioral disturbance: Secondary | ICD-10-CM | POA: Diagnosis not present

## 2020-11-04 DIAGNOSIS — N390 Urinary tract infection, site not specified: Secondary | ICD-10-CM | POA: Diagnosis not present

## 2020-11-05 DIAGNOSIS — R2681 Unsteadiness on feet: Secondary | ICD-10-CM | POA: Diagnosis not present

## 2020-11-05 DIAGNOSIS — M6281 Muscle weakness (generalized): Secondary | ICD-10-CM | POA: Diagnosis not present

## 2020-11-10 DIAGNOSIS — R2681 Unsteadiness on feet: Secondary | ICD-10-CM | POA: Diagnosis not present

## 2020-11-10 DIAGNOSIS — F0390 Unspecified dementia without behavioral disturbance: Secondary | ICD-10-CM | POA: Diagnosis not present

## 2020-11-10 DIAGNOSIS — K219 Gastro-esophageal reflux disease without esophagitis: Secondary | ICD-10-CM | POA: Diagnosis not present

## 2020-11-10 DIAGNOSIS — E569 Vitamin deficiency, unspecified: Secondary | ICD-10-CM | POA: Diagnosis not present

## 2020-11-10 DIAGNOSIS — I10 Essential (primary) hypertension: Secondary | ICD-10-CM | POA: Diagnosis not present

## 2020-11-10 DIAGNOSIS — E039 Hypothyroidism, unspecified: Secondary | ICD-10-CM | POA: Diagnosis not present

## 2020-11-11 DIAGNOSIS — E569 Vitamin deficiency, unspecified: Secondary | ICD-10-CM | POA: Diagnosis not present

## 2020-11-11 DIAGNOSIS — K219 Gastro-esophageal reflux disease without esophagitis: Secondary | ICD-10-CM | POA: Diagnosis not present

## 2020-11-11 DIAGNOSIS — I10 Essential (primary) hypertension: Secondary | ICD-10-CM | POA: Diagnosis not present

## 2020-11-11 DIAGNOSIS — R2681 Unsteadiness on feet: Secondary | ICD-10-CM | POA: Diagnosis not present

## 2020-11-11 DIAGNOSIS — F0390 Unspecified dementia without behavioral disturbance: Secondary | ICD-10-CM | POA: Diagnosis not present

## 2020-11-11 DIAGNOSIS — E039 Hypothyroidism, unspecified: Secondary | ICD-10-CM | POA: Diagnosis not present

## 2020-11-12 DIAGNOSIS — F0390 Unspecified dementia without behavioral disturbance: Secondary | ICD-10-CM | POA: Diagnosis not present

## 2020-11-12 DIAGNOSIS — K219 Gastro-esophageal reflux disease without esophagitis: Secondary | ICD-10-CM | POA: Diagnosis not present

## 2020-11-12 DIAGNOSIS — E569 Vitamin deficiency, unspecified: Secondary | ICD-10-CM | POA: Diagnosis not present

## 2020-11-12 DIAGNOSIS — R2681 Unsteadiness on feet: Secondary | ICD-10-CM | POA: Diagnosis not present

## 2020-11-12 DIAGNOSIS — I10 Essential (primary) hypertension: Secondary | ICD-10-CM | POA: Diagnosis not present

## 2020-11-12 DIAGNOSIS — E039 Hypothyroidism, unspecified: Secondary | ICD-10-CM | POA: Diagnosis not present

## 2020-11-14 DIAGNOSIS — E039 Hypothyroidism, unspecified: Secondary | ICD-10-CM | POA: Diagnosis not present

## 2020-11-14 DIAGNOSIS — I10 Essential (primary) hypertension: Secondary | ICD-10-CM | POA: Diagnosis not present

## 2020-11-14 DIAGNOSIS — F0390 Unspecified dementia without behavioral disturbance: Secondary | ICD-10-CM | POA: Diagnosis not present

## 2020-11-14 DIAGNOSIS — K219 Gastro-esophageal reflux disease without esophagitis: Secondary | ICD-10-CM | POA: Diagnosis not present

## 2020-11-14 DIAGNOSIS — R2681 Unsteadiness on feet: Secondary | ICD-10-CM | POA: Diagnosis not present

## 2020-11-14 DIAGNOSIS — E569 Vitamin deficiency, unspecified: Secondary | ICD-10-CM | POA: Diagnosis not present

## 2020-11-15 DIAGNOSIS — E039 Hypothyroidism, unspecified: Secondary | ICD-10-CM | POA: Diagnosis not present

## 2020-11-15 DIAGNOSIS — K219 Gastro-esophageal reflux disease without esophagitis: Secondary | ICD-10-CM | POA: Diagnosis not present

## 2020-11-15 DIAGNOSIS — F0390 Unspecified dementia without behavioral disturbance: Secondary | ICD-10-CM | POA: Diagnosis not present

## 2020-11-15 DIAGNOSIS — E569 Vitamin deficiency, unspecified: Secondary | ICD-10-CM | POA: Diagnosis not present

## 2020-11-15 DIAGNOSIS — I10 Essential (primary) hypertension: Secondary | ICD-10-CM | POA: Diagnosis not present

## 2020-11-15 DIAGNOSIS — R2681 Unsteadiness on feet: Secondary | ICD-10-CM | POA: Diagnosis not present

## 2020-11-16 DIAGNOSIS — K219 Gastro-esophageal reflux disease without esophagitis: Secondary | ICD-10-CM | POA: Diagnosis not present

## 2020-11-16 DIAGNOSIS — F0390 Unspecified dementia without behavioral disturbance: Secondary | ICD-10-CM | POA: Diagnosis not present

## 2020-11-16 DIAGNOSIS — R2681 Unsteadiness on feet: Secondary | ICD-10-CM | POA: Diagnosis not present

## 2020-11-16 DIAGNOSIS — I10 Essential (primary) hypertension: Secondary | ICD-10-CM | POA: Diagnosis not present

## 2020-11-16 DIAGNOSIS — E039 Hypothyroidism, unspecified: Secondary | ICD-10-CM | POA: Diagnosis not present

## 2020-11-16 DIAGNOSIS — E569 Vitamin deficiency, unspecified: Secondary | ICD-10-CM | POA: Diagnosis not present

## 2020-11-17 DIAGNOSIS — I10 Essential (primary) hypertension: Secondary | ICD-10-CM | POA: Diagnosis not present

## 2020-11-17 DIAGNOSIS — F0390 Unspecified dementia without behavioral disturbance: Secondary | ICD-10-CM | POA: Diagnosis not present

## 2020-11-17 DIAGNOSIS — K219 Gastro-esophageal reflux disease without esophagitis: Secondary | ICD-10-CM | POA: Diagnosis not present

## 2020-11-17 DIAGNOSIS — E039 Hypothyroidism, unspecified: Secondary | ICD-10-CM | POA: Diagnosis not present

## 2020-11-17 DIAGNOSIS — E569 Vitamin deficiency, unspecified: Secondary | ICD-10-CM | POA: Diagnosis not present

## 2020-11-17 DIAGNOSIS — R2681 Unsteadiness on feet: Secondary | ICD-10-CM | POA: Diagnosis not present

## 2020-11-18 DIAGNOSIS — F0390 Unspecified dementia without behavioral disturbance: Secondary | ICD-10-CM | POA: Diagnosis not present

## 2020-11-18 DIAGNOSIS — W19XXXA Unspecified fall, initial encounter: Secondary | ICD-10-CM | POA: Diagnosis not present

## 2020-11-19 DIAGNOSIS — I10 Essential (primary) hypertension: Secondary | ICD-10-CM | POA: Diagnosis not present

## 2020-11-19 DIAGNOSIS — R2681 Unsteadiness on feet: Secondary | ICD-10-CM | POA: Diagnosis not present

## 2020-11-19 DIAGNOSIS — K219 Gastro-esophageal reflux disease without esophagitis: Secondary | ICD-10-CM | POA: Diagnosis not present

## 2020-11-19 DIAGNOSIS — E569 Vitamin deficiency, unspecified: Secondary | ICD-10-CM | POA: Diagnosis not present

## 2020-11-19 DIAGNOSIS — E039 Hypothyroidism, unspecified: Secondary | ICD-10-CM | POA: Diagnosis not present

## 2020-11-19 DIAGNOSIS — F0390 Unspecified dementia without behavioral disturbance: Secondary | ICD-10-CM | POA: Diagnosis not present

## 2020-11-22 DIAGNOSIS — R2681 Unsteadiness on feet: Secondary | ICD-10-CM | POA: Diagnosis not present

## 2020-11-22 DIAGNOSIS — E569 Vitamin deficiency, unspecified: Secondary | ICD-10-CM | POA: Diagnosis not present

## 2020-11-22 DIAGNOSIS — K219 Gastro-esophageal reflux disease without esophagitis: Secondary | ICD-10-CM | POA: Diagnosis not present

## 2020-11-22 DIAGNOSIS — E039 Hypothyroidism, unspecified: Secondary | ICD-10-CM | POA: Diagnosis not present

## 2020-11-22 DIAGNOSIS — F0390 Unspecified dementia without behavioral disturbance: Secondary | ICD-10-CM | POA: Diagnosis not present

## 2020-11-22 DIAGNOSIS — I10 Essential (primary) hypertension: Secondary | ICD-10-CM | POA: Diagnosis not present

## 2020-11-23 DIAGNOSIS — E569 Vitamin deficiency, unspecified: Secondary | ICD-10-CM | POA: Diagnosis not present

## 2020-11-23 DIAGNOSIS — E039 Hypothyroidism, unspecified: Secondary | ICD-10-CM | POA: Diagnosis not present

## 2020-11-23 DIAGNOSIS — R2681 Unsteadiness on feet: Secondary | ICD-10-CM | POA: Diagnosis not present

## 2020-11-23 DIAGNOSIS — F0390 Unspecified dementia without behavioral disturbance: Secondary | ICD-10-CM | POA: Diagnosis not present

## 2020-11-23 DIAGNOSIS — I10 Essential (primary) hypertension: Secondary | ICD-10-CM | POA: Diagnosis not present

## 2020-11-23 DIAGNOSIS — K219 Gastro-esophageal reflux disease without esophagitis: Secondary | ICD-10-CM | POA: Diagnosis not present

## 2020-11-24 DIAGNOSIS — I10 Essential (primary) hypertension: Secondary | ICD-10-CM | POA: Diagnosis not present

## 2020-11-24 DIAGNOSIS — E039 Hypothyroidism, unspecified: Secondary | ICD-10-CM | POA: Diagnosis not present

## 2020-11-24 DIAGNOSIS — F0390 Unspecified dementia without behavioral disturbance: Secondary | ICD-10-CM | POA: Diagnosis not present

## 2020-11-24 DIAGNOSIS — R2681 Unsteadiness on feet: Secondary | ICD-10-CM | POA: Diagnosis not present

## 2020-11-24 DIAGNOSIS — E569 Vitamin deficiency, unspecified: Secondary | ICD-10-CM | POA: Diagnosis not present

## 2020-11-24 DIAGNOSIS — K219 Gastro-esophageal reflux disease without esophagitis: Secondary | ICD-10-CM | POA: Diagnosis not present

## 2020-11-25 DIAGNOSIS — I10 Essential (primary) hypertension: Secondary | ICD-10-CM | POA: Diagnosis not present

## 2020-11-25 DIAGNOSIS — F0390 Unspecified dementia without behavioral disturbance: Secondary | ICD-10-CM | POA: Diagnosis not present

## 2020-11-25 DIAGNOSIS — R2681 Unsteadiness on feet: Secondary | ICD-10-CM | POA: Diagnosis not present

## 2020-11-25 DIAGNOSIS — E039 Hypothyroidism, unspecified: Secondary | ICD-10-CM | POA: Diagnosis not present

## 2020-11-25 DIAGNOSIS — E569 Vitamin deficiency, unspecified: Secondary | ICD-10-CM | POA: Diagnosis not present

## 2020-11-25 DIAGNOSIS — K219 Gastro-esophageal reflux disease without esophagitis: Secondary | ICD-10-CM | POA: Diagnosis not present

## 2020-11-26 DIAGNOSIS — K219 Gastro-esophageal reflux disease without esophagitis: Secondary | ICD-10-CM | POA: Diagnosis not present

## 2020-11-26 DIAGNOSIS — F0390 Unspecified dementia without behavioral disturbance: Secondary | ICD-10-CM | POA: Diagnosis not present

## 2020-11-26 DIAGNOSIS — R2681 Unsteadiness on feet: Secondary | ICD-10-CM | POA: Diagnosis not present

## 2020-11-26 DIAGNOSIS — E039 Hypothyroidism, unspecified: Secondary | ICD-10-CM | POA: Diagnosis not present

## 2020-11-26 DIAGNOSIS — I10 Essential (primary) hypertension: Secondary | ICD-10-CM | POA: Diagnosis not present

## 2020-11-26 DIAGNOSIS — E569 Vitamin deficiency, unspecified: Secondary | ICD-10-CM | POA: Diagnosis not present

## 2020-11-29 DIAGNOSIS — F0390 Unspecified dementia without behavioral disturbance: Secondary | ICD-10-CM | POA: Diagnosis not present

## 2020-11-29 DIAGNOSIS — E039 Hypothyroidism, unspecified: Secondary | ICD-10-CM | POA: Diagnosis not present

## 2020-11-29 DIAGNOSIS — I10 Essential (primary) hypertension: Secondary | ICD-10-CM | POA: Diagnosis not present

## 2020-11-29 DIAGNOSIS — K219 Gastro-esophageal reflux disease without esophagitis: Secondary | ICD-10-CM | POA: Diagnosis not present

## 2020-11-29 DIAGNOSIS — E569 Vitamin deficiency, unspecified: Secondary | ICD-10-CM | POA: Diagnosis not present

## 2020-11-29 DIAGNOSIS — R2681 Unsteadiness on feet: Secondary | ICD-10-CM | POA: Diagnosis not present

## 2020-11-30 DIAGNOSIS — F0390 Unspecified dementia without behavioral disturbance: Secondary | ICD-10-CM | POA: Diagnosis not present

## 2020-11-30 DIAGNOSIS — I10 Essential (primary) hypertension: Secondary | ICD-10-CM | POA: Diagnosis not present

## 2020-11-30 DIAGNOSIS — R2681 Unsteadiness on feet: Secondary | ICD-10-CM | POA: Diagnosis not present

## 2020-11-30 DIAGNOSIS — K219 Gastro-esophageal reflux disease without esophagitis: Secondary | ICD-10-CM | POA: Diagnosis not present

## 2020-11-30 DIAGNOSIS — E569 Vitamin deficiency, unspecified: Secondary | ICD-10-CM | POA: Diagnosis not present

## 2020-11-30 DIAGNOSIS — E039 Hypothyroidism, unspecified: Secondary | ICD-10-CM | POA: Diagnosis not present

## 2020-12-01 DIAGNOSIS — I10 Essential (primary) hypertension: Secondary | ICD-10-CM | POA: Diagnosis not present

## 2020-12-01 DIAGNOSIS — R2681 Unsteadiness on feet: Secondary | ICD-10-CM | POA: Diagnosis not present

## 2020-12-01 DIAGNOSIS — E569 Vitamin deficiency, unspecified: Secondary | ICD-10-CM | POA: Diagnosis not present

## 2020-12-01 DIAGNOSIS — K219 Gastro-esophageal reflux disease without esophagitis: Secondary | ICD-10-CM | POA: Diagnosis not present

## 2020-12-01 DIAGNOSIS — E039 Hypothyroidism, unspecified: Secondary | ICD-10-CM | POA: Diagnosis not present

## 2020-12-01 DIAGNOSIS — F0390 Unspecified dementia without behavioral disturbance: Secondary | ICD-10-CM | POA: Diagnosis not present

## 2020-12-02 DIAGNOSIS — R2681 Unsteadiness on feet: Secondary | ICD-10-CM | POA: Diagnosis not present

## 2020-12-02 DIAGNOSIS — R4182 Altered mental status, unspecified: Secondary | ICD-10-CM | POA: Diagnosis not present

## 2020-12-02 DIAGNOSIS — D518 Other vitamin B12 deficiency anemias: Secondary | ICD-10-CM | POA: Diagnosis not present

## 2020-12-02 DIAGNOSIS — N390 Urinary tract infection, site not specified: Secondary | ICD-10-CM | POA: Diagnosis not present

## 2020-12-02 DIAGNOSIS — E569 Vitamin deficiency, unspecified: Secondary | ICD-10-CM | POA: Diagnosis not present

## 2020-12-02 DIAGNOSIS — F0390 Unspecified dementia without behavioral disturbance: Secondary | ICD-10-CM | POA: Diagnosis not present

## 2020-12-02 DIAGNOSIS — I10 Essential (primary) hypertension: Secondary | ICD-10-CM | POA: Diagnosis not present

## 2020-12-02 DIAGNOSIS — E039 Hypothyroidism, unspecified: Secondary | ICD-10-CM | POA: Diagnosis not present

## 2020-12-02 DIAGNOSIS — D508 Other iron deficiency anemias: Secondary | ICD-10-CM | POA: Diagnosis not present

## 2020-12-02 DIAGNOSIS — K219 Gastro-esophageal reflux disease without esophagitis: Secondary | ICD-10-CM | POA: Diagnosis not present

## 2020-12-03 DIAGNOSIS — E039 Hypothyroidism, unspecified: Secondary | ICD-10-CM | POA: Diagnosis not present

## 2020-12-03 DIAGNOSIS — D518 Other vitamin B12 deficiency anemias: Secondary | ICD-10-CM | POA: Diagnosis not present

## 2020-12-03 DIAGNOSIS — N390 Urinary tract infection, site not specified: Secondary | ICD-10-CM | POA: Diagnosis not present

## 2020-12-03 DIAGNOSIS — R4182 Altered mental status, unspecified: Secondary | ICD-10-CM | POA: Diagnosis not present

## 2020-12-06 DIAGNOSIS — E569 Vitamin deficiency, unspecified: Secondary | ICD-10-CM | POA: Diagnosis not present

## 2020-12-06 DIAGNOSIS — F0390 Unspecified dementia without behavioral disturbance: Secondary | ICD-10-CM | POA: Diagnosis not present

## 2020-12-06 DIAGNOSIS — K219 Gastro-esophageal reflux disease without esophagitis: Secondary | ICD-10-CM | POA: Diagnosis not present

## 2020-12-06 DIAGNOSIS — E039 Hypothyroidism, unspecified: Secondary | ICD-10-CM | POA: Diagnosis not present

## 2020-12-06 DIAGNOSIS — I10 Essential (primary) hypertension: Secondary | ICD-10-CM | POA: Diagnosis not present

## 2020-12-06 DIAGNOSIS — R2681 Unsteadiness on feet: Secondary | ICD-10-CM | POA: Diagnosis not present

## 2020-12-07 DIAGNOSIS — F331 Major depressive disorder, recurrent, moderate: Secondary | ICD-10-CM | POA: Diagnosis not present

## 2020-12-07 DIAGNOSIS — G9009 Other idiopathic peripheral autonomic neuropathy: Secondary | ICD-10-CM | POA: Diagnosis not present

## 2020-12-07 DIAGNOSIS — E039 Hypothyroidism, unspecified: Secondary | ICD-10-CM | POA: Diagnosis not present

## 2020-12-07 DIAGNOSIS — R2681 Unsteadiness on feet: Secondary | ICD-10-CM | POA: Diagnosis not present

## 2020-12-07 DIAGNOSIS — I10 Essential (primary) hypertension: Secondary | ICD-10-CM | POA: Diagnosis not present

## 2020-12-07 DIAGNOSIS — G4701 Insomnia due to medical condition: Secondary | ICD-10-CM | POA: Diagnosis not present

## 2020-12-07 DIAGNOSIS — K219 Gastro-esophageal reflux disease without esophagitis: Secondary | ICD-10-CM | POA: Diagnosis not present

## 2020-12-07 DIAGNOSIS — E569 Vitamin deficiency, unspecified: Secondary | ICD-10-CM | POA: Diagnosis not present

## 2020-12-07 DIAGNOSIS — F0390 Unspecified dementia without behavioral disturbance: Secondary | ICD-10-CM | POA: Diagnosis not present

## 2020-12-08 DIAGNOSIS — E039 Hypothyroidism, unspecified: Secondary | ICD-10-CM | POA: Diagnosis not present

## 2020-12-08 DIAGNOSIS — F0390 Unspecified dementia without behavioral disturbance: Secondary | ICD-10-CM | POA: Diagnosis not present

## 2020-12-08 DIAGNOSIS — R2681 Unsteadiness on feet: Secondary | ICD-10-CM | POA: Diagnosis not present

## 2020-12-08 DIAGNOSIS — I10 Essential (primary) hypertension: Secondary | ICD-10-CM | POA: Diagnosis not present

## 2020-12-08 DIAGNOSIS — E569 Vitamin deficiency, unspecified: Secondary | ICD-10-CM | POA: Diagnosis not present

## 2020-12-08 DIAGNOSIS — K219 Gastro-esophageal reflux disease without esophagitis: Secondary | ICD-10-CM | POA: Diagnosis not present

## 2020-12-09 DIAGNOSIS — E039 Hypothyroidism, unspecified: Secondary | ICD-10-CM | POA: Diagnosis not present

## 2020-12-09 DIAGNOSIS — F0390 Unspecified dementia without behavioral disturbance: Secondary | ICD-10-CM | POA: Diagnosis not present

## 2020-12-09 DIAGNOSIS — I10 Essential (primary) hypertension: Secondary | ICD-10-CM | POA: Diagnosis not present

## 2020-12-09 DIAGNOSIS — R2681 Unsteadiness on feet: Secondary | ICD-10-CM | POA: Diagnosis not present

## 2020-12-09 DIAGNOSIS — K219 Gastro-esophageal reflux disease without esophagitis: Secondary | ICD-10-CM | POA: Diagnosis not present

## 2020-12-09 DIAGNOSIS — E569 Vitamin deficiency, unspecified: Secondary | ICD-10-CM | POA: Diagnosis not present

## 2020-12-10 DIAGNOSIS — E569 Vitamin deficiency, unspecified: Secondary | ICD-10-CM | POA: Diagnosis not present

## 2020-12-10 DIAGNOSIS — N390 Urinary tract infection, site not specified: Secondary | ICD-10-CM | POA: Diagnosis not present

## 2020-12-10 DIAGNOSIS — E039 Hypothyroidism, unspecified: Secondary | ICD-10-CM | POA: Diagnosis not present

## 2020-12-10 DIAGNOSIS — Z8616 Personal history of COVID-19: Secondary | ICD-10-CM | POA: Diagnosis not present

## 2020-12-10 DIAGNOSIS — R2681 Unsteadiness on feet: Secondary | ICD-10-CM | POA: Diagnosis not present

## 2020-12-10 DIAGNOSIS — F0390 Unspecified dementia without behavioral disturbance: Secondary | ICD-10-CM | POA: Diagnosis not present

## 2020-12-10 DIAGNOSIS — I10 Essential (primary) hypertension: Secondary | ICD-10-CM | POA: Diagnosis not present

## 2020-12-13 DIAGNOSIS — R2681 Unsteadiness on feet: Secondary | ICD-10-CM | POA: Diagnosis not present

## 2020-12-13 DIAGNOSIS — Z8616 Personal history of COVID-19: Secondary | ICD-10-CM | POA: Diagnosis not present

## 2020-12-13 DIAGNOSIS — F0390 Unspecified dementia without behavioral disturbance: Secondary | ICD-10-CM | POA: Diagnosis not present

## 2020-12-13 DIAGNOSIS — I10 Essential (primary) hypertension: Secondary | ICD-10-CM | POA: Diagnosis not present

## 2020-12-13 DIAGNOSIS — E039 Hypothyroidism, unspecified: Secondary | ICD-10-CM | POA: Diagnosis not present

## 2020-12-13 DIAGNOSIS — E569 Vitamin deficiency, unspecified: Secondary | ICD-10-CM | POA: Diagnosis not present

## 2020-12-13 DIAGNOSIS — N390 Urinary tract infection, site not specified: Secondary | ICD-10-CM | POA: Diagnosis not present

## 2020-12-14 DIAGNOSIS — Z8616 Personal history of COVID-19: Secondary | ICD-10-CM | POA: Diagnosis not present

## 2020-12-14 DIAGNOSIS — E569 Vitamin deficiency, unspecified: Secondary | ICD-10-CM | POA: Diagnosis not present

## 2020-12-14 DIAGNOSIS — E039 Hypothyroidism, unspecified: Secondary | ICD-10-CM | POA: Diagnosis not present

## 2020-12-14 DIAGNOSIS — R2681 Unsteadiness on feet: Secondary | ICD-10-CM | POA: Diagnosis not present

## 2020-12-14 DIAGNOSIS — F0390 Unspecified dementia without behavioral disturbance: Secondary | ICD-10-CM | POA: Diagnosis not present

## 2020-12-14 DIAGNOSIS — I10 Essential (primary) hypertension: Secondary | ICD-10-CM | POA: Diagnosis not present

## 2020-12-14 DIAGNOSIS — N390 Urinary tract infection, site not specified: Secondary | ICD-10-CM | POA: Diagnosis not present

## 2020-12-15 DIAGNOSIS — I10 Essential (primary) hypertension: Secondary | ICD-10-CM | POA: Diagnosis not present

## 2020-12-15 DIAGNOSIS — E039 Hypothyroidism, unspecified: Secondary | ICD-10-CM | POA: Diagnosis not present

## 2020-12-15 DIAGNOSIS — Z8616 Personal history of COVID-19: Secondary | ICD-10-CM | POA: Diagnosis not present

## 2020-12-15 DIAGNOSIS — F0390 Unspecified dementia without behavioral disturbance: Secondary | ICD-10-CM | POA: Diagnosis not present

## 2020-12-15 DIAGNOSIS — R2681 Unsteadiness on feet: Secondary | ICD-10-CM | POA: Diagnosis not present

## 2020-12-15 DIAGNOSIS — N390 Urinary tract infection, site not specified: Secondary | ICD-10-CM | POA: Diagnosis not present

## 2020-12-15 DIAGNOSIS — E569 Vitamin deficiency, unspecified: Secondary | ICD-10-CM | POA: Diagnosis not present

## 2020-12-16 DIAGNOSIS — N390 Urinary tract infection, site not specified: Secondary | ICD-10-CM | POA: Diagnosis not present

## 2020-12-16 DIAGNOSIS — Z8616 Personal history of COVID-19: Secondary | ICD-10-CM | POA: Diagnosis not present

## 2020-12-16 DIAGNOSIS — F0390 Unspecified dementia without behavioral disturbance: Secondary | ICD-10-CM | POA: Diagnosis not present

## 2020-12-16 DIAGNOSIS — I10 Essential (primary) hypertension: Secondary | ICD-10-CM | POA: Diagnosis not present

## 2020-12-16 DIAGNOSIS — R2681 Unsteadiness on feet: Secondary | ICD-10-CM | POA: Diagnosis not present

## 2020-12-16 DIAGNOSIS — E039 Hypothyroidism, unspecified: Secondary | ICD-10-CM | POA: Diagnosis not present

## 2020-12-16 DIAGNOSIS — E569 Vitamin deficiency, unspecified: Secondary | ICD-10-CM | POA: Diagnosis not present

## 2020-12-17 DIAGNOSIS — Z8616 Personal history of COVID-19: Secondary | ICD-10-CM | POA: Diagnosis not present

## 2020-12-17 DIAGNOSIS — R2681 Unsteadiness on feet: Secondary | ICD-10-CM | POA: Diagnosis not present

## 2020-12-17 DIAGNOSIS — I10 Essential (primary) hypertension: Secondary | ICD-10-CM | POA: Diagnosis not present

## 2020-12-17 DIAGNOSIS — N390 Urinary tract infection, site not specified: Secondary | ICD-10-CM | POA: Diagnosis not present

## 2020-12-17 DIAGNOSIS — E039 Hypothyroidism, unspecified: Secondary | ICD-10-CM | POA: Diagnosis not present

## 2020-12-17 DIAGNOSIS — E569 Vitamin deficiency, unspecified: Secondary | ICD-10-CM | POA: Diagnosis not present

## 2020-12-17 DIAGNOSIS — F0390 Unspecified dementia without behavioral disturbance: Secondary | ICD-10-CM | POA: Diagnosis not present

## 2020-12-20 DIAGNOSIS — F0390 Unspecified dementia without behavioral disturbance: Secondary | ICD-10-CM | POA: Diagnosis not present

## 2020-12-20 DIAGNOSIS — I10 Essential (primary) hypertension: Secondary | ICD-10-CM | POA: Diagnosis not present

## 2020-12-20 DIAGNOSIS — E039 Hypothyroidism, unspecified: Secondary | ICD-10-CM | POA: Diagnosis not present

## 2020-12-20 DIAGNOSIS — R2681 Unsteadiness on feet: Secondary | ICD-10-CM | POA: Diagnosis not present

## 2020-12-20 DIAGNOSIS — E569 Vitamin deficiency, unspecified: Secondary | ICD-10-CM | POA: Diagnosis not present

## 2020-12-20 DIAGNOSIS — Z8616 Personal history of COVID-19: Secondary | ICD-10-CM | POA: Diagnosis not present

## 2020-12-20 DIAGNOSIS — N390 Urinary tract infection, site not specified: Secondary | ICD-10-CM | POA: Diagnosis not present

## 2020-12-21 DIAGNOSIS — Z8616 Personal history of COVID-19: Secondary | ICD-10-CM | POA: Diagnosis not present

## 2020-12-21 DIAGNOSIS — F0390 Unspecified dementia without behavioral disturbance: Secondary | ICD-10-CM | POA: Diagnosis not present

## 2020-12-21 DIAGNOSIS — E039 Hypothyroidism, unspecified: Secondary | ICD-10-CM | POA: Diagnosis not present

## 2020-12-21 DIAGNOSIS — E569 Vitamin deficiency, unspecified: Secondary | ICD-10-CM | POA: Diagnosis not present

## 2020-12-21 DIAGNOSIS — R2681 Unsteadiness on feet: Secondary | ICD-10-CM | POA: Diagnosis not present

## 2020-12-21 DIAGNOSIS — N390 Urinary tract infection, site not specified: Secondary | ICD-10-CM | POA: Diagnosis not present

## 2020-12-21 DIAGNOSIS — I10 Essential (primary) hypertension: Secondary | ICD-10-CM | POA: Diagnosis not present

## 2020-12-22 DIAGNOSIS — Z8616 Personal history of COVID-19: Secondary | ICD-10-CM | POA: Diagnosis not present

## 2020-12-22 DIAGNOSIS — E039 Hypothyroidism, unspecified: Secondary | ICD-10-CM | POA: Diagnosis not present

## 2020-12-22 DIAGNOSIS — N390 Urinary tract infection, site not specified: Secondary | ICD-10-CM | POA: Diagnosis not present

## 2020-12-22 DIAGNOSIS — I10 Essential (primary) hypertension: Secondary | ICD-10-CM | POA: Diagnosis not present

## 2020-12-22 DIAGNOSIS — E569 Vitamin deficiency, unspecified: Secondary | ICD-10-CM | POA: Diagnosis not present

## 2020-12-22 DIAGNOSIS — F0390 Unspecified dementia without behavioral disturbance: Secondary | ICD-10-CM | POA: Diagnosis not present

## 2020-12-22 DIAGNOSIS — R2681 Unsteadiness on feet: Secondary | ICD-10-CM | POA: Diagnosis not present

## 2020-12-28 DIAGNOSIS — K219 Gastro-esophageal reflux disease without esophagitis: Secondary | ICD-10-CM | POA: Diagnosis not present

## 2020-12-28 DIAGNOSIS — E039 Hypothyroidism, unspecified: Secondary | ICD-10-CM | POA: Diagnosis not present

## 2020-12-28 DIAGNOSIS — F0390 Unspecified dementia without behavioral disturbance: Secondary | ICD-10-CM | POA: Diagnosis not present

## 2020-12-28 DIAGNOSIS — R42 Dizziness and giddiness: Secondary | ICD-10-CM | POA: Diagnosis not present

## 2020-12-30 DIAGNOSIS — I10 Essential (primary) hypertension: Secondary | ICD-10-CM | POA: Diagnosis not present

## 2021-01-07 DIAGNOSIS — G9009 Other idiopathic peripheral autonomic neuropathy: Secondary | ICD-10-CM | POA: Diagnosis not present

## 2021-01-07 DIAGNOSIS — F3289 Other specified depressive episodes: Secondary | ICD-10-CM | POA: Diagnosis not present

## 2021-01-07 DIAGNOSIS — F331 Major depressive disorder, recurrent, moderate: Secondary | ICD-10-CM | POA: Diagnosis not present

## 2021-01-07 DIAGNOSIS — G4701 Insomnia due to medical condition: Secondary | ICD-10-CM | POA: Diagnosis not present

## 2021-01-12 DIAGNOSIS — K219 Gastro-esophageal reflux disease without esophagitis: Secondary | ICD-10-CM | POA: Diagnosis not present

## 2021-01-12 DIAGNOSIS — D649 Anemia, unspecified: Secondary | ICD-10-CM | POA: Diagnosis not present

## 2021-01-12 DIAGNOSIS — J45909 Unspecified asthma, uncomplicated: Secondary | ICD-10-CM | POA: Diagnosis not present

## 2021-01-12 DIAGNOSIS — F0390 Unspecified dementia without behavioral disturbance: Secondary | ICD-10-CM | POA: Diagnosis not present

## 2021-01-12 DIAGNOSIS — N189 Chronic kidney disease, unspecified: Secondary | ICD-10-CM | POA: Diagnosis not present

## 2021-01-12 DIAGNOSIS — E039 Hypothyroidism, unspecified: Secondary | ICD-10-CM | POA: Diagnosis not present

## 2021-01-12 DIAGNOSIS — G8929 Other chronic pain: Secondary | ICD-10-CM | POA: Diagnosis not present

## 2021-01-21 DIAGNOSIS — G8929 Other chronic pain: Secondary | ICD-10-CM | POA: Diagnosis not present

## 2021-01-21 DIAGNOSIS — R300 Dysuria: Secondary | ICD-10-CM | POA: Diagnosis not present

## 2021-01-21 DIAGNOSIS — F0390 Unspecified dementia without behavioral disturbance: Secondary | ICD-10-CM | POA: Diagnosis not present

## 2021-01-22 DIAGNOSIS — N390 Urinary tract infection, site not specified: Secondary | ICD-10-CM | POA: Diagnosis not present

## 2021-01-24 DIAGNOSIS — N390 Urinary tract infection, site not specified: Secondary | ICD-10-CM | POA: Diagnosis not present

## 2021-01-24 DIAGNOSIS — F0390 Unspecified dementia without behavioral disturbance: Secondary | ICD-10-CM | POA: Diagnosis not present

## 2021-01-24 DIAGNOSIS — G8929 Other chronic pain: Secondary | ICD-10-CM | POA: Diagnosis not present

## 2021-02-09 DIAGNOSIS — E039 Hypothyroidism, unspecified: Secondary | ICD-10-CM | POA: Diagnosis not present

## 2021-02-09 DIAGNOSIS — R41841 Cognitive communication deficit: Secondary | ICD-10-CM | POA: Diagnosis not present

## 2021-02-09 DIAGNOSIS — E7849 Other hyperlipidemia: Secondary | ICD-10-CM | POA: Diagnosis not present

## 2021-02-09 DIAGNOSIS — I10 Essential (primary) hypertension: Secondary | ICD-10-CM | POA: Diagnosis not present

## 2021-02-09 DIAGNOSIS — F0390 Unspecified dementia without behavioral disturbance: Secondary | ICD-10-CM | POA: Diagnosis not present

## 2021-02-09 DIAGNOSIS — M6281 Muscle weakness (generalized): Secondary | ICD-10-CM | POA: Diagnosis not present

## 2021-02-09 DIAGNOSIS — E569 Vitamin deficiency, unspecified: Secondary | ICD-10-CM | POA: Diagnosis not present

## 2021-02-09 DIAGNOSIS — K219 Gastro-esophageal reflux disease without esophagitis: Secondary | ICD-10-CM | POA: Diagnosis not present

## 2021-02-09 DIAGNOSIS — F3289 Other specified depressive episodes: Secondary | ICD-10-CM | POA: Diagnosis not present

## 2021-02-10 DIAGNOSIS — E039 Hypothyroidism, unspecified: Secondary | ICD-10-CM | POA: Diagnosis not present

## 2021-02-10 DIAGNOSIS — F3289 Other specified depressive episodes: Secondary | ICD-10-CM | POA: Diagnosis not present

## 2021-02-10 DIAGNOSIS — E569 Vitamin deficiency, unspecified: Secondary | ICD-10-CM | POA: Diagnosis not present

## 2021-02-10 DIAGNOSIS — I10 Essential (primary) hypertension: Secondary | ICD-10-CM | POA: Diagnosis not present

## 2021-02-10 DIAGNOSIS — E7849 Other hyperlipidemia: Secondary | ICD-10-CM | POA: Diagnosis not present

## 2021-02-10 DIAGNOSIS — K219 Gastro-esophageal reflux disease without esophagitis: Secondary | ICD-10-CM | POA: Diagnosis not present

## 2021-02-10 DIAGNOSIS — R41841 Cognitive communication deficit: Secondary | ICD-10-CM | POA: Diagnosis not present

## 2021-02-10 DIAGNOSIS — F0390 Unspecified dementia without behavioral disturbance: Secondary | ICD-10-CM | POA: Diagnosis not present

## 2021-02-10 DIAGNOSIS — M6281 Muscle weakness (generalized): Secondary | ICD-10-CM | POA: Diagnosis not present

## 2021-02-11 DIAGNOSIS — E039 Hypothyroidism, unspecified: Secondary | ICD-10-CM | POA: Diagnosis not present

## 2021-02-11 DIAGNOSIS — M6281 Muscle weakness (generalized): Secondary | ICD-10-CM | POA: Diagnosis not present

## 2021-02-11 DIAGNOSIS — K219 Gastro-esophageal reflux disease without esophagitis: Secondary | ICD-10-CM | POA: Diagnosis not present

## 2021-02-11 DIAGNOSIS — R41841 Cognitive communication deficit: Secondary | ICD-10-CM | POA: Diagnosis not present

## 2021-02-11 DIAGNOSIS — E7849 Other hyperlipidemia: Secondary | ICD-10-CM | POA: Diagnosis not present

## 2021-02-11 DIAGNOSIS — I10 Essential (primary) hypertension: Secondary | ICD-10-CM | POA: Diagnosis not present

## 2021-02-11 DIAGNOSIS — E569 Vitamin deficiency, unspecified: Secondary | ICD-10-CM | POA: Diagnosis not present

## 2021-02-11 DIAGNOSIS — F3289 Other specified depressive episodes: Secondary | ICD-10-CM | POA: Diagnosis not present

## 2021-02-11 DIAGNOSIS — F0390 Unspecified dementia without behavioral disturbance: Secondary | ICD-10-CM | POA: Diagnosis not present

## 2021-02-14 DIAGNOSIS — E569 Vitamin deficiency, unspecified: Secondary | ICD-10-CM | POA: Diagnosis not present

## 2021-02-14 DIAGNOSIS — F0390 Unspecified dementia without behavioral disturbance: Secondary | ICD-10-CM | POA: Diagnosis not present

## 2021-02-14 DIAGNOSIS — K219 Gastro-esophageal reflux disease without esophagitis: Secondary | ICD-10-CM | POA: Diagnosis not present

## 2021-02-14 DIAGNOSIS — E7849 Other hyperlipidemia: Secondary | ICD-10-CM | POA: Diagnosis not present

## 2021-02-14 DIAGNOSIS — R41841 Cognitive communication deficit: Secondary | ICD-10-CM | POA: Diagnosis not present

## 2021-02-14 DIAGNOSIS — F3289 Other specified depressive episodes: Secondary | ICD-10-CM | POA: Diagnosis not present

## 2021-02-14 DIAGNOSIS — E039 Hypothyroidism, unspecified: Secondary | ICD-10-CM | POA: Diagnosis not present

## 2021-02-14 DIAGNOSIS — I10 Essential (primary) hypertension: Secondary | ICD-10-CM | POA: Diagnosis not present

## 2021-02-14 DIAGNOSIS — M6281 Muscle weakness (generalized): Secondary | ICD-10-CM | POA: Diagnosis not present

## 2021-02-15 DIAGNOSIS — K219 Gastro-esophageal reflux disease without esophagitis: Secondary | ICD-10-CM | POA: Diagnosis not present

## 2021-02-15 DIAGNOSIS — I10 Essential (primary) hypertension: Secondary | ICD-10-CM | POA: Diagnosis not present

## 2021-02-15 DIAGNOSIS — E039 Hypothyroidism, unspecified: Secondary | ICD-10-CM | POA: Diagnosis not present

## 2021-02-15 DIAGNOSIS — R41841 Cognitive communication deficit: Secondary | ICD-10-CM | POA: Diagnosis not present

## 2021-02-15 DIAGNOSIS — E569 Vitamin deficiency, unspecified: Secondary | ICD-10-CM | POA: Diagnosis not present

## 2021-02-15 DIAGNOSIS — M6281 Muscle weakness (generalized): Secondary | ICD-10-CM | POA: Diagnosis not present

## 2021-02-15 DIAGNOSIS — F0390 Unspecified dementia without behavioral disturbance: Secondary | ICD-10-CM | POA: Diagnosis not present

## 2021-02-15 DIAGNOSIS — E7849 Other hyperlipidemia: Secondary | ICD-10-CM | POA: Diagnosis not present

## 2021-02-15 DIAGNOSIS — F3289 Other specified depressive episodes: Secondary | ICD-10-CM | POA: Diagnosis not present

## 2021-02-16 DIAGNOSIS — E039 Hypothyroidism, unspecified: Secondary | ICD-10-CM | POA: Diagnosis not present

## 2021-02-16 DIAGNOSIS — R41841 Cognitive communication deficit: Secondary | ICD-10-CM | POA: Diagnosis not present

## 2021-02-16 DIAGNOSIS — M6281 Muscle weakness (generalized): Secondary | ICD-10-CM | POA: Diagnosis not present

## 2021-02-16 DIAGNOSIS — F3289 Other specified depressive episodes: Secondary | ICD-10-CM | POA: Diagnosis not present

## 2021-02-16 DIAGNOSIS — E7849 Other hyperlipidemia: Secondary | ICD-10-CM | POA: Diagnosis not present

## 2021-02-16 DIAGNOSIS — F0390 Unspecified dementia without behavioral disturbance: Secondary | ICD-10-CM | POA: Diagnosis not present

## 2021-02-16 DIAGNOSIS — E569 Vitamin deficiency, unspecified: Secondary | ICD-10-CM | POA: Diagnosis not present

## 2021-02-16 DIAGNOSIS — I10 Essential (primary) hypertension: Secondary | ICD-10-CM | POA: Diagnosis not present

## 2021-02-16 DIAGNOSIS — K219 Gastro-esophageal reflux disease without esophagitis: Secondary | ICD-10-CM | POA: Diagnosis not present

## 2021-03-04 DIAGNOSIS — I517 Cardiomegaly: Secondary | ICD-10-CM | POA: Diagnosis not present

## 2021-03-04 DIAGNOSIS — F0390 Unspecified dementia without behavioral disturbance: Secondary | ICD-10-CM | POA: Diagnosis not present

## 2021-03-04 DIAGNOSIS — R059 Cough, unspecified: Secondary | ICD-10-CM | POA: Diagnosis not present

## 2021-03-04 DIAGNOSIS — J810 Acute pulmonary edema: Secondary | ICD-10-CM | POA: Diagnosis not present

## 2021-03-04 DIAGNOSIS — I509 Heart failure, unspecified: Secondary | ICD-10-CM | POA: Diagnosis not present

## 2021-03-08 DIAGNOSIS — I509 Heart failure, unspecified: Secondary | ICD-10-CM | POA: Diagnosis not present

## 2021-03-08 DIAGNOSIS — F0390 Unspecified dementia without behavioral disturbance: Secondary | ICD-10-CM | POA: Diagnosis not present

## 2021-03-08 DIAGNOSIS — R059 Cough, unspecified: Secondary | ICD-10-CM | POA: Diagnosis not present

## 2021-03-09 DIAGNOSIS — I5021 Acute systolic (congestive) heart failure: Secondary | ICD-10-CM | POA: Diagnosis not present

## 2021-03-09 DIAGNOSIS — I10 Essential (primary) hypertension: Secondary | ICD-10-CM | POA: Diagnosis not present

## 2021-03-11 DIAGNOSIS — F0390 Unspecified dementia without behavioral disturbance: Secondary | ICD-10-CM | POA: Diagnosis not present

## 2021-03-11 DIAGNOSIS — R059 Cough, unspecified: Secondary | ICD-10-CM | POA: Diagnosis not present

## 2021-03-11 DIAGNOSIS — I509 Heart failure, unspecified: Secondary | ICD-10-CM | POA: Diagnosis not present

## 2021-03-12 DIAGNOSIS — I10 Essential (primary) hypertension: Secondary | ICD-10-CM | POA: Diagnosis not present

## 2021-03-28 DIAGNOSIS — G8929 Other chronic pain: Secondary | ICD-10-CM | POA: Diagnosis not present

## 2021-03-28 DIAGNOSIS — D649 Anemia, unspecified: Secondary | ICD-10-CM | POA: Diagnosis not present

## 2021-03-28 DIAGNOSIS — J45909 Unspecified asthma, uncomplicated: Secondary | ICD-10-CM | POA: Diagnosis not present

## 2021-03-28 DIAGNOSIS — F0390 Unspecified dementia without behavioral disturbance: Secondary | ICD-10-CM | POA: Diagnosis not present

## 2021-03-28 DIAGNOSIS — R300 Dysuria: Secondary | ICD-10-CM | POA: Diagnosis not present

## 2021-03-28 DIAGNOSIS — K219 Gastro-esophageal reflux disease without esophagitis: Secondary | ICD-10-CM | POA: Diagnosis not present

## 2021-03-28 DIAGNOSIS — E039 Hypothyroidism, unspecified: Secondary | ICD-10-CM | POA: Diagnosis not present

## 2021-03-28 DIAGNOSIS — I509 Heart failure, unspecified: Secondary | ICD-10-CM | POA: Diagnosis not present

## 2021-03-28 DIAGNOSIS — R059 Cough, unspecified: Secondary | ICD-10-CM | POA: Diagnosis not present

## 2021-03-31 DIAGNOSIS — R4182 Altered mental status, unspecified: Secondary | ICD-10-CM | POA: Diagnosis not present

## 2021-04-20 DIAGNOSIS — M6281 Muscle weakness (generalized): Secondary | ICD-10-CM | POA: Diagnosis not present

## 2021-04-20 DIAGNOSIS — K219 Gastro-esophageal reflux disease without esophagitis: Secondary | ICD-10-CM | POA: Diagnosis not present

## 2021-04-20 DIAGNOSIS — Z8616 Personal history of COVID-19: Secondary | ICD-10-CM | POA: Diagnosis not present

## 2021-04-20 DIAGNOSIS — I10 Essential (primary) hypertension: Secondary | ICD-10-CM | POA: Diagnosis not present

## 2021-04-20 DIAGNOSIS — R41841 Cognitive communication deficit: Secondary | ICD-10-CM | POA: Diagnosis not present

## 2021-04-20 DIAGNOSIS — M797 Fibromyalgia: Secondary | ICD-10-CM | POA: Diagnosis not present

## 2021-04-20 DIAGNOSIS — F0390 Unspecified dementia without behavioral disturbance: Secondary | ICD-10-CM | POA: Diagnosis not present

## 2021-04-20 DIAGNOSIS — E569 Vitamin deficiency, unspecified: Secondary | ICD-10-CM | POA: Diagnosis not present

## 2021-04-20 DIAGNOSIS — R2681 Unsteadiness on feet: Secondary | ICD-10-CM | POA: Diagnosis not present

## 2021-04-22 DIAGNOSIS — Z8616 Personal history of COVID-19: Secondary | ICD-10-CM | POA: Diagnosis not present

## 2021-04-22 DIAGNOSIS — I10 Essential (primary) hypertension: Secondary | ICD-10-CM | POA: Diagnosis not present

## 2021-04-22 DIAGNOSIS — M797 Fibromyalgia: Secondary | ICD-10-CM | POA: Diagnosis not present

## 2021-04-22 DIAGNOSIS — K219 Gastro-esophageal reflux disease without esophagitis: Secondary | ICD-10-CM | POA: Diagnosis not present

## 2021-04-22 DIAGNOSIS — M6281 Muscle weakness (generalized): Secondary | ICD-10-CM | POA: Diagnosis not present

## 2021-04-22 DIAGNOSIS — R2681 Unsteadiness on feet: Secondary | ICD-10-CM | POA: Diagnosis not present

## 2021-04-22 DIAGNOSIS — E569 Vitamin deficiency, unspecified: Secondary | ICD-10-CM | POA: Diagnosis not present

## 2021-04-22 DIAGNOSIS — F0390 Unspecified dementia without behavioral disturbance: Secondary | ICD-10-CM | POA: Diagnosis not present

## 2021-04-22 DIAGNOSIS — R41841 Cognitive communication deficit: Secondary | ICD-10-CM | POA: Diagnosis not present

## 2021-04-24 DIAGNOSIS — R41841 Cognitive communication deficit: Secondary | ICD-10-CM | POA: Diagnosis not present

## 2021-04-24 DIAGNOSIS — M797 Fibromyalgia: Secondary | ICD-10-CM | POA: Diagnosis not present

## 2021-04-24 DIAGNOSIS — F0390 Unspecified dementia without behavioral disturbance: Secondary | ICD-10-CM | POA: Diagnosis not present

## 2021-04-24 DIAGNOSIS — I10 Essential (primary) hypertension: Secondary | ICD-10-CM | POA: Diagnosis not present

## 2021-04-24 DIAGNOSIS — K219 Gastro-esophageal reflux disease without esophagitis: Secondary | ICD-10-CM | POA: Diagnosis not present

## 2021-04-24 DIAGNOSIS — M6281 Muscle weakness (generalized): Secondary | ICD-10-CM | POA: Diagnosis not present

## 2021-04-24 DIAGNOSIS — Z8616 Personal history of COVID-19: Secondary | ICD-10-CM | POA: Diagnosis not present

## 2021-04-24 DIAGNOSIS — E569 Vitamin deficiency, unspecified: Secondary | ICD-10-CM | POA: Diagnosis not present

## 2021-04-24 DIAGNOSIS — R2681 Unsteadiness on feet: Secondary | ICD-10-CM | POA: Diagnosis not present

## 2021-04-25 DIAGNOSIS — M6281 Muscle weakness (generalized): Secondary | ICD-10-CM | POA: Diagnosis not present

## 2021-04-25 DIAGNOSIS — R41841 Cognitive communication deficit: Secondary | ICD-10-CM | POA: Diagnosis not present

## 2021-04-25 DIAGNOSIS — E569 Vitamin deficiency, unspecified: Secondary | ICD-10-CM | POA: Diagnosis not present

## 2021-04-25 DIAGNOSIS — M797 Fibromyalgia: Secondary | ICD-10-CM | POA: Diagnosis not present

## 2021-04-25 DIAGNOSIS — R2681 Unsteadiness on feet: Secondary | ICD-10-CM | POA: Diagnosis not present

## 2021-04-25 DIAGNOSIS — I10 Essential (primary) hypertension: Secondary | ICD-10-CM | POA: Diagnosis not present

## 2021-04-25 DIAGNOSIS — Z8616 Personal history of COVID-19: Secondary | ICD-10-CM | POA: Diagnosis not present

## 2021-04-25 DIAGNOSIS — K219 Gastro-esophageal reflux disease without esophagitis: Secondary | ICD-10-CM | POA: Diagnosis not present

## 2021-04-25 DIAGNOSIS — F0390 Unspecified dementia without behavioral disturbance: Secondary | ICD-10-CM | POA: Diagnosis not present

## 2021-04-26 DIAGNOSIS — F0390 Unspecified dementia without behavioral disturbance: Secondary | ICD-10-CM | POA: Diagnosis not present

## 2021-04-26 DIAGNOSIS — K219 Gastro-esophageal reflux disease without esophagitis: Secondary | ICD-10-CM | POA: Diagnosis not present

## 2021-04-26 DIAGNOSIS — I10 Essential (primary) hypertension: Secondary | ICD-10-CM | POA: Diagnosis not present

## 2021-04-26 DIAGNOSIS — Z8616 Personal history of COVID-19: Secondary | ICD-10-CM | POA: Diagnosis not present

## 2021-04-26 DIAGNOSIS — R41841 Cognitive communication deficit: Secondary | ICD-10-CM | POA: Diagnosis not present

## 2021-04-26 DIAGNOSIS — E569 Vitamin deficiency, unspecified: Secondary | ICD-10-CM | POA: Diagnosis not present

## 2021-04-26 DIAGNOSIS — M797 Fibromyalgia: Secondary | ICD-10-CM | POA: Diagnosis not present

## 2021-04-26 DIAGNOSIS — R2681 Unsteadiness on feet: Secondary | ICD-10-CM | POA: Diagnosis not present

## 2021-04-26 DIAGNOSIS — M6281 Muscle weakness (generalized): Secondary | ICD-10-CM | POA: Diagnosis not present

## 2021-04-27 DIAGNOSIS — G301 Alzheimer's disease with late onset: Secondary | ICD-10-CM | POA: Diagnosis not present

## 2021-04-27 DIAGNOSIS — G4701 Insomnia due to medical condition: Secondary | ICD-10-CM | POA: Diagnosis not present

## 2021-04-27 DIAGNOSIS — M797 Fibromyalgia: Secondary | ICD-10-CM | POA: Diagnosis not present

## 2021-04-27 DIAGNOSIS — M6281 Muscle weakness (generalized): Secondary | ICD-10-CM | POA: Diagnosis not present

## 2021-04-27 DIAGNOSIS — F331 Major depressive disorder, recurrent, moderate: Secondary | ICD-10-CM | POA: Diagnosis not present

## 2021-04-27 DIAGNOSIS — K219 Gastro-esophageal reflux disease without esophagitis: Secondary | ICD-10-CM | POA: Diagnosis not present

## 2021-04-27 DIAGNOSIS — Z8616 Personal history of COVID-19: Secondary | ICD-10-CM | POA: Diagnosis not present

## 2021-04-27 DIAGNOSIS — E569 Vitamin deficiency, unspecified: Secondary | ICD-10-CM | POA: Diagnosis not present

## 2021-04-27 DIAGNOSIS — R41841 Cognitive communication deficit: Secondary | ICD-10-CM | POA: Diagnosis not present

## 2021-04-27 DIAGNOSIS — R2681 Unsteadiness on feet: Secondary | ICD-10-CM | POA: Diagnosis not present

## 2021-04-27 DIAGNOSIS — F0280 Dementia in other diseases classified elsewhere without behavioral disturbance: Secondary | ICD-10-CM | POA: Diagnosis not present

## 2021-04-27 DIAGNOSIS — I10 Essential (primary) hypertension: Secondary | ICD-10-CM | POA: Diagnosis not present

## 2021-04-27 DIAGNOSIS — F0390 Unspecified dementia without behavioral disturbance: Secondary | ICD-10-CM | POA: Diagnosis not present

## 2021-04-28 DIAGNOSIS — M6281 Muscle weakness (generalized): Secondary | ICD-10-CM | POA: Diagnosis not present

## 2021-04-28 DIAGNOSIS — R2681 Unsteadiness on feet: Secondary | ICD-10-CM | POA: Diagnosis not present

## 2021-04-28 DIAGNOSIS — F0390 Unspecified dementia without behavioral disturbance: Secondary | ICD-10-CM | POA: Diagnosis not present

## 2021-04-28 DIAGNOSIS — Z8616 Personal history of COVID-19: Secondary | ICD-10-CM | POA: Diagnosis not present

## 2021-04-28 DIAGNOSIS — M797 Fibromyalgia: Secondary | ICD-10-CM | POA: Diagnosis not present

## 2021-04-28 DIAGNOSIS — R41841 Cognitive communication deficit: Secondary | ICD-10-CM | POA: Diagnosis not present

## 2021-04-28 DIAGNOSIS — E569 Vitamin deficiency, unspecified: Secondary | ICD-10-CM | POA: Diagnosis not present

## 2021-04-28 DIAGNOSIS — I10 Essential (primary) hypertension: Secondary | ICD-10-CM | POA: Diagnosis not present

## 2021-04-28 DIAGNOSIS — K219 Gastro-esophageal reflux disease without esophagitis: Secondary | ICD-10-CM | POA: Diagnosis not present

## 2021-04-29 DIAGNOSIS — R41841 Cognitive communication deficit: Secondary | ICD-10-CM | POA: Diagnosis not present

## 2021-04-29 DIAGNOSIS — I10 Essential (primary) hypertension: Secondary | ICD-10-CM | POA: Diagnosis not present

## 2021-04-29 DIAGNOSIS — Z8616 Personal history of COVID-19: Secondary | ICD-10-CM | POA: Diagnosis not present

## 2021-04-29 DIAGNOSIS — M6281 Muscle weakness (generalized): Secondary | ICD-10-CM | POA: Diagnosis not present

## 2021-04-29 DIAGNOSIS — E569 Vitamin deficiency, unspecified: Secondary | ICD-10-CM | POA: Diagnosis not present

## 2021-04-29 DIAGNOSIS — F0390 Unspecified dementia without behavioral disturbance: Secondary | ICD-10-CM | POA: Diagnosis not present

## 2021-04-29 DIAGNOSIS — M797 Fibromyalgia: Secondary | ICD-10-CM | POA: Diagnosis not present

## 2021-04-29 DIAGNOSIS — R2681 Unsteadiness on feet: Secondary | ICD-10-CM | POA: Diagnosis not present

## 2021-04-29 DIAGNOSIS — K219 Gastro-esophageal reflux disease without esophagitis: Secondary | ICD-10-CM | POA: Diagnosis not present

## 2021-04-30 DIAGNOSIS — R4182 Altered mental status, unspecified: Secondary | ICD-10-CM | POA: Diagnosis not present

## 2021-05-02 DIAGNOSIS — F0390 Unspecified dementia without behavioral disturbance: Secondary | ICD-10-CM | POA: Diagnosis not present

## 2021-05-02 DIAGNOSIS — I10 Essential (primary) hypertension: Secondary | ICD-10-CM | POA: Diagnosis not present

## 2021-05-02 DIAGNOSIS — R41841 Cognitive communication deficit: Secondary | ICD-10-CM | POA: Diagnosis not present

## 2021-05-02 DIAGNOSIS — M6281 Muscle weakness (generalized): Secondary | ICD-10-CM | POA: Diagnosis not present

## 2021-05-02 DIAGNOSIS — M797 Fibromyalgia: Secondary | ICD-10-CM | POA: Diagnosis not present

## 2021-05-02 DIAGNOSIS — E569 Vitamin deficiency, unspecified: Secondary | ICD-10-CM | POA: Diagnosis not present

## 2021-05-02 DIAGNOSIS — R2681 Unsteadiness on feet: Secondary | ICD-10-CM | POA: Diagnosis not present

## 2021-05-02 DIAGNOSIS — K219 Gastro-esophageal reflux disease without esophagitis: Secondary | ICD-10-CM | POA: Diagnosis not present

## 2021-05-02 DIAGNOSIS — Z8616 Personal history of COVID-19: Secondary | ICD-10-CM | POA: Diagnosis not present

## 2021-05-03 DIAGNOSIS — K219 Gastro-esophageal reflux disease without esophagitis: Secondary | ICD-10-CM | POA: Diagnosis not present

## 2021-05-03 DIAGNOSIS — M6281 Muscle weakness (generalized): Secondary | ICD-10-CM | POA: Diagnosis not present

## 2021-05-03 DIAGNOSIS — F0390 Unspecified dementia without behavioral disturbance: Secondary | ICD-10-CM | POA: Diagnosis not present

## 2021-05-03 DIAGNOSIS — R41841 Cognitive communication deficit: Secondary | ICD-10-CM | POA: Diagnosis not present

## 2021-05-03 DIAGNOSIS — M797 Fibromyalgia: Secondary | ICD-10-CM | POA: Diagnosis not present

## 2021-05-03 DIAGNOSIS — R2681 Unsteadiness on feet: Secondary | ICD-10-CM | POA: Diagnosis not present

## 2021-05-03 DIAGNOSIS — I10 Essential (primary) hypertension: Secondary | ICD-10-CM | POA: Diagnosis not present

## 2021-05-03 DIAGNOSIS — Z8616 Personal history of COVID-19: Secondary | ICD-10-CM | POA: Diagnosis not present

## 2021-05-03 DIAGNOSIS — E569 Vitamin deficiency, unspecified: Secondary | ICD-10-CM | POA: Diagnosis not present

## 2021-05-04 DIAGNOSIS — I10 Essential (primary) hypertension: Secondary | ICD-10-CM | POA: Diagnosis not present

## 2021-05-04 DIAGNOSIS — Z8616 Personal history of COVID-19: Secondary | ICD-10-CM | POA: Diagnosis not present

## 2021-05-04 DIAGNOSIS — M797 Fibromyalgia: Secondary | ICD-10-CM | POA: Diagnosis not present

## 2021-05-04 DIAGNOSIS — R41841 Cognitive communication deficit: Secondary | ICD-10-CM | POA: Diagnosis not present

## 2021-05-04 DIAGNOSIS — M6281 Muscle weakness (generalized): Secondary | ICD-10-CM | POA: Diagnosis not present

## 2021-05-04 DIAGNOSIS — R2681 Unsteadiness on feet: Secondary | ICD-10-CM | POA: Diagnosis not present

## 2021-05-04 DIAGNOSIS — K219 Gastro-esophageal reflux disease without esophagitis: Secondary | ICD-10-CM | POA: Diagnosis not present

## 2021-05-04 DIAGNOSIS — F0390 Unspecified dementia without behavioral disturbance: Secondary | ICD-10-CM | POA: Diagnosis not present

## 2021-05-04 DIAGNOSIS — M24571 Contracture, right ankle: Secondary | ICD-10-CM | POA: Diagnosis not present

## 2021-05-04 DIAGNOSIS — M24572 Contracture, left ankle: Secondary | ICD-10-CM | POA: Diagnosis not present

## 2021-05-04 DIAGNOSIS — E569 Vitamin deficiency, unspecified: Secondary | ICD-10-CM | POA: Diagnosis not present

## 2021-05-05 DIAGNOSIS — M797 Fibromyalgia: Secondary | ICD-10-CM | POA: Diagnosis not present

## 2021-05-05 DIAGNOSIS — R2681 Unsteadiness on feet: Secondary | ICD-10-CM | POA: Diagnosis not present

## 2021-05-05 DIAGNOSIS — F0390 Unspecified dementia without behavioral disturbance: Secondary | ICD-10-CM | POA: Diagnosis not present

## 2021-05-05 DIAGNOSIS — R41841 Cognitive communication deficit: Secondary | ICD-10-CM | POA: Diagnosis not present

## 2021-05-05 DIAGNOSIS — K219 Gastro-esophageal reflux disease without esophagitis: Secondary | ICD-10-CM | POA: Diagnosis not present

## 2021-05-05 DIAGNOSIS — E569 Vitamin deficiency, unspecified: Secondary | ICD-10-CM | POA: Diagnosis not present

## 2021-05-05 DIAGNOSIS — Z8616 Personal history of COVID-19: Secondary | ICD-10-CM | POA: Diagnosis not present

## 2021-05-05 DIAGNOSIS — M6281 Muscle weakness (generalized): Secondary | ICD-10-CM | POA: Diagnosis not present

## 2021-05-05 DIAGNOSIS — I10 Essential (primary) hypertension: Secondary | ICD-10-CM | POA: Diagnosis not present

## 2021-05-06 DIAGNOSIS — R41841 Cognitive communication deficit: Secondary | ICD-10-CM | POA: Diagnosis not present

## 2021-05-06 DIAGNOSIS — K219 Gastro-esophageal reflux disease without esophagitis: Secondary | ICD-10-CM | POA: Diagnosis not present

## 2021-05-06 DIAGNOSIS — M6281 Muscle weakness (generalized): Secondary | ICD-10-CM | POA: Diagnosis not present

## 2021-05-06 DIAGNOSIS — M797 Fibromyalgia: Secondary | ICD-10-CM | POA: Diagnosis not present

## 2021-05-06 DIAGNOSIS — Z8616 Personal history of COVID-19: Secondary | ICD-10-CM | POA: Diagnosis not present

## 2021-05-06 DIAGNOSIS — I10 Essential (primary) hypertension: Secondary | ICD-10-CM | POA: Diagnosis not present

## 2021-05-06 DIAGNOSIS — R2681 Unsteadiness on feet: Secondary | ICD-10-CM | POA: Diagnosis not present

## 2021-05-06 DIAGNOSIS — F0390 Unspecified dementia without behavioral disturbance: Secondary | ICD-10-CM | POA: Diagnosis not present

## 2021-05-06 DIAGNOSIS — E569 Vitamin deficiency, unspecified: Secondary | ICD-10-CM | POA: Diagnosis not present

## 2021-05-09 DIAGNOSIS — Z8616 Personal history of COVID-19: Secondary | ICD-10-CM | POA: Diagnosis not present

## 2021-05-09 DIAGNOSIS — M6281 Muscle weakness (generalized): Secondary | ICD-10-CM | POA: Diagnosis not present

## 2021-05-09 DIAGNOSIS — E569 Vitamin deficiency, unspecified: Secondary | ICD-10-CM | POA: Diagnosis not present

## 2021-05-09 DIAGNOSIS — R2681 Unsteadiness on feet: Secondary | ICD-10-CM | POA: Diagnosis not present

## 2021-05-09 DIAGNOSIS — R41841 Cognitive communication deficit: Secondary | ICD-10-CM | POA: Diagnosis not present

## 2021-05-09 DIAGNOSIS — I10 Essential (primary) hypertension: Secondary | ICD-10-CM | POA: Diagnosis not present

## 2021-05-09 DIAGNOSIS — F0390 Unspecified dementia without behavioral disturbance: Secondary | ICD-10-CM | POA: Diagnosis not present

## 2021-05-09 DIAGNOSIS — M797 Fibromyalgia: Secondary | ICD-10-CM | POA: Diagnosis not present

## 2021-05-09 DIAGNOSIS — K219 Gastro-esophageal reflux disease without esophagitis: Secondary | ICD-10-CM | POA: Diagnosis not present

## 2021-05-10 DIAGNOSIS — R2681 Unsteadiness on feet: Secondary | ICD-10-CM | POA: Diagnosis not present

## 2021-05-10 DIAGNOSIS — G8929 Other chronic pain: Secondary | ICD-10-CM | POA: Diagnosis not present

## 2021-05-10 DIAGNOSIS — J45909 Unspecified asthma, uncomplicated: Secondary | ICD-10-CM | POA: Diagnosis not present

## 2021-05-10 DIAGNOSIS — Z8616 Personal history of COVID-19: Secondary | ICD-10-CM | POA: Diagnosis not present

## 2021-05-10 DIAGNOSIS — R531 Weakness: Secondary | ICD-10-CM | POA: Diagnosis not present

## 2021-05-10 DIAGNOSIS — F0390 Unspecified dementia without behavioral disturbance: Secondary | ICD-10-CM | POA: Diagnosis not present

## 2021-05-10 DIAGNOSIS — E569 Vitamin deficiency, unspecified: Secondary | ICD-10-CM | POA: Diagnosis not present

## 2021-05-10 DIAGNOSIS — U071 COVID-19: Secondary | ICD-10-CM | POA: Diagnosis not present

## 2021-05-10 DIAGNOSIS — I10 Essential (primary) hypertension: Secondary | ICD-10-CM | POA: Diagnosis not present

## 2021-05-10 DIAGNOSIS — K219 Gastro-esophageal reflux disease without esophagitis: Secondary | ICD-10-CM | POA: Diagnosis not present

## 2021-05-10 DIAGNOSIS — F339 Major depressive disorder, recurrent, unspecified: Secondary | ICD-10-CM | POA: Diagnosis not present

## 2021-05-10 DIAGNOSIS — M797 Fibromyalgia: Secondary | ICD-10-CM | POA: Diagnosis not present

## 2021-05-10 DIAGNOSIS — E039 Hypothyroidism, unspecified: Secondary | ICD-10-CM | POA: Diagnosis not present

## 2021-05-10 DIAGNOSIS — M6281 Muscle weakness (generalized): Secondary | ICD-10-CM | POA: Diagnosis not present

## 2021-05-10 DIAGNOSIS — R41841 Cognitive communication deficit: Secondary | ICD-10-CM | POA: Diagnosis not present

## 2021-05-10 DIAGNOSIS — I130 Hypertensive heart and chronic kidney disease with heart failure and stage 1 through stage 4 chronic kidney disease, or unspecified chronic kidney disease: Secondary | ICD-10-CM | POA: Diagnosis not present

## 2021-05-12 DIAGNOSIS — K219 Gastro-esophageal reflux disease without esophagitis: Secondary | ICD-10-CM | POA: Diagnosis not present

## 2021-05-12 DIAGNOSIS — F0390 Unspecified dementia without behavioral disturbance: Secondary | ICD-10-CM | POA: Diagnosis not present

## 2021-05-12 DIAGNOSIS — E569 Vitamin deficiency, unspecified: Secondary | ICD-10-CM | POA: Diagnosis not present

## 2021-05-12 DIAGNOSIS — R2681 Unsteadiness on feet: Secondary | ICD-10-CM | POA: Diagnosis not present

## 2021-05-12 DIAGNOSIS — R41841 Cognitive communication deficit: Secondary | ICD-10-CM | POA: Diagnosis not present

## 2021-05-12 DIAGNOSIS — I10 Essential (primary) hypertension: Secondary | ICD-10-CM | POA: Diagnosis not present

## 2021-05-12 DIAGNOSIS — M797 Fibromyalgia: Secondary | ICD-10-CM | POA: Diagnosis not present

## 2021-05-12 DIAGNOSIS — M6281 Muscle weakness (generalized): Secondary | ICD-10-CM | POA: Diagnosis not present

## 2021-05-12 DIAGNOSIS — Z8616 Personal history of COVID-19: Secondary | ICD-10-CM | POA: Diagnosis not present

## 2021-05-13 DIAGNOSIS — R2681 Unsteadiness on feet: Secondary | ICD-10-CM | POA: Diagnosis not present

## 2021-05-13 DIAGNOSIS — Z8616 Personal history of COVID-19: Secondary | ICD-10-CM | POA: Diagnosis not present

## 2021-05-13 DIAGNOSIS — M6281 Muscle weakness (generalized): Secondary | ICD-10-CM | POA: Diagnosis not present

## 2021-05-13 DIAGNOSIS — R41841 Cognitive communication deficit: Secondary | ICD-10-CM | POA: Diagnosis not present

## 2021-05-13 DIAGNOSIS — I10 Essential (primary) hypertension: Secondary | ICD-10-CM | POA: Diagnosis not present

## 2021-05-13 DIAGNOSIS — K219 Gastro-esophageal reflux disease without esophagitis: Secondary | ICD-10-CM | POA: Diagnosis not present

## 2021-05-13 DIAGNOSIS — M797 Fibromyalgia: Secondary | ICD-10-CM | POA: Diagnosis not present

## 2021-05-13 DIAGNOSIS — E569 Vitamin deficiency, unspecified: Secondary | ICD-10-CM | POA: Diagnosis not present

## 2021-05-13 DIAGNOSIS — F0390 Unspecified dementia without behavioral disturbance: Secondary | ICD-10-CM | POA: Diagnosis not present

## 2021-05-14 DIAGNOSIS — R41841 Cognitive communication deficit: Secondary | ICD-10-CM | POA: Diagnosis not present

## 2021-05-14 DIAGNOSIS — M6281 Muscle weakness (generalized): Secondary | ICD-10-CM | POA: Diagnosis not present

## 2021-05-14 DIAGNOSIS — M797 Fibromyalgia: Secondary | ICD-10-CM | POA: Diagnosis not present

## 2021-05-14 DIAGNOSIS — Z8616 Personal history of COVID-19: Secondary | ICD-10-CM | POA: Diagnosis not present

## 2021-05-14 DIAGNOSIS — K219 Gastro-esophageal reflux disease without esophagitis: Secondary | ICD-10-CM | POA: Diagnosis not present

## 2021-05-14 DIAGNOSIS — E569 Vitamin deficiency, unspecified: Secondary | ICD-10-CM | POA: Diagnosis not present

## 2021-05-14 DIAGNOSIS — F0390 Unspecified dementia without behavioral disturbance: Secondary | ICD-10-CM | POA: Diagnosis not present

## 2021-05-14 DIAGNOSIS — I10 Essential (primary) hypertension: Secondary | ICD-10-CM | POA: Diagnosis not present

## 2021-05-14 DIAGNOSIS — R2681 Unsteadiness on feet: Secondary | ICD-10-CM | POA: Diagnosis not present

## 2021-05-16 DIAGNOSIS — M6281 Muscle weakness (generalized): Secondary | ICD-10-CM | POA: Diagnosis not present

## 2021-05-16 DIAGNOSIS — Z8616 Personal history of COVID-19: Secondary | ICD-10-CM | POA: Diagnosis not present

## 2021-05-16 DIAGNOSIS — K219 Gastro-esophageal reflux disease without esophagitis: Secondary | ICD-10-CM | POA: Diagnosis not present

## 2021-05-16 DIAGNOSIS — I10 Essential (primary) hypertension: Secondary | ICD-10-CM | POA: Diagnosis not present

## 2021-05-16 DIAGNOSIS — R41841 Cognitive communication deficit: Secondary | ICD-10-CM | POA: Diagnosis not present

## 2021-05-16 DIAGNOSIS — F0390 Unspecified dementia without behavioral disturbance: Secondary | ICD-10-CM | POA: Diagnosis not present

## 2021-05-16 DIAGNOSIS — R2681 Unsteadiness on feet: Secondary | ICD-10-CM | POA: Diagnosis not present

## 2021-05-16 DIAGNOSIS — E569 Vitamin deficiency, unspecified: Secondary | ICD-10-CM | POA: Diagnosis not present

## 2021-05-16 DIAGNOSIS — M797 Fibromyalgia: Secondary | ICD-10-CM | POA: Diagnosis not present

## 2021-06-07 DIAGNOSIS — L603 Nail dystrophy: Secondary | ICD-10-CM | POA: Diagnosis not present

## 2021-06-07 DIAGNOSIS — L602 Onychogryphosis: Secondary | ICD-10-CM | POA: Diagnosis not present

## 2021-06-07 DIAGNOSIS — I739 Peripheral vascular disease, unspecified: Secondary | ICD-10-CM | POA: Diagnosis not present

## 2021-06-08 DIAGNOSIS — G4701 Insomnia due to medical condition: Secondary | ICD-10-CM | POA: Diagnosis not present

## 2021-06-08 DIAGNOSIS — G301 Alzheimer's disease with late onset: Secondary | ICD-10-CM | POA: Diagnosis not present

## 2021-06-08 DIAGNOSIS — F0280 Dementia in other diseases classified elsewhere without behavioral disturbance: Secondary | ICD-10-CM | POA: Diagnosis not present

## 2021-06-08 DIAGNOSIS — F331 Major depressive disorder, recurrent, moderate: Secondary | ICD-10-CM | POA: Diagnosis not present

## 2021-07-06 DIAGNOSIS — G301 Alzheimer's disease with late onset: Secondary | ICD-10-CM | POA: Diagnosis not present

## 2021-07-06 DIAGNOSIS — F331 Major depressive disorder, recurrent, moderate: Secondary | ICD-10-CM | POA: Diagnosis not present

## 2021-07-06 DIAGNOSIS — G4701 Insomnia due to medical condition: Secondary | ICD-10-CM | POA: Diagnosis not present

## 2021-07-06 DIAGNOSIS — F0280 Dementia in other diseases classified elsewhere without behavioral disturbance: Secondary | ICD-10-CM | POA: Diagnosis not present

## 2021-07-07 DIAGNOSIS — J45909 Unspecified asthma, uncomplicated: Secondary | ICD-10-CM | POA: Diagnosis not present

## 2021-07-07 DIAGNOSIS — R531 Weakness: Secondary | ICD-10-CM | POA: Diagnosis not present

## 2021-07-07 DIAGNOSIS — E039 Hypothyroidism, unspecified: Secondary | ICD-10-CM | POA: Diagnosis not present

## 2021-07-07 DIAGNOSIS — I130 Hypertensive heart and chronic kidney disease with heart failure and stage 1 through stage 4 chronic kidney disease, or unspecified chronic kidney disease: Secondary | ICD-10-CM | POA: Diagnosis not present

## 2021-07-07 DIAGNOSIS — G8929 Other chronic pain: Secondary | ICD-10-CM | POA: Diagnosis not present

## 2021-07-07 DIAGNOSIS — F339 Major depressive disorder, recurrent, unspecified: Secondary | ICD-10-CM | POA: Diagnosis not present

## 2021-07-07 DIAGNOSIS — K219 Gastro-esophageal reflux disease without esophagitis: Secondary | ICD-10-CM | POA: Diagnosis not present

## 2021-07-07 DIAGNOSIS — F0390 Unspecified dementia without behavioral disturbance: Secondary | ICD-10-CM | POA: Diagnosis not present

## 2021-07-08 DIAGNOSIS — M6281 Muscle weakness (generalized): Secondary | ICD-10-CM | POA: Diagnosis not present

## 2021-07-08 DIAGNOSIS — E569 Vitamin deficiency, unspecified: Secondary | ICD-10-CM | POA: Diagnosis not present

## 2021-07-08 DIAGNOSIS — F3289 Other specified depressive episodes: Secondary | ICD-10-CM | POA: Diagnosis not present

## 2021-07-08 DIAGNOSIS — R41841 Cognitive communication deficit: Secondary | ICD-10-CM | POA: Diagnosis not present

## 2021-07-08 DIAGNOSIS — F0390 Unspecified dementia without behavioral disturbance: Secondary | ICD-10-CM | POA: Diagnosis not present

## 2021-07-08 DIAGNOSIS — R2681 Unsteadiness on feet: Secondary | ICD-10-CM | POA: Diagnosis not present

## 2021-07-08 DIAGNOSIS — E039 Hypothyroidism, unspecified: Secondary | ICD-10-CM | POA: Diagnosis not present

## 2021-07-08 DIAGNOSIS — K219 Gastro-esophageal reflux disease without esophagitis: Secondary | ICD-10-CM | POA: Diagnosis not present

## 2021-07-08 DIAGNOSIS — I10 Essential (primary) hypertension: Secondary | ICD-10-CM | POA: Diagnosis not present

## 2021-07-13 DIAGNOSIS — I10 Essential (primary) hypertension: Secondary | ICD-10-CM | POA: Diagnosis not present

## 2021-07-13 DIAGNOSIS — R2681 Unsteadiness on feet: Secondary | ICD-10-CM | POA: Diagnosis not present

## 2021-07-13 DIAGNOSIS — K219 Gastro-esophageal reflux disease without esophagitis: Secondary | ICD-10-CM | POA: Diagnosis not present

## 2021-07-13 DIAGNOSIS — F3289 Other specified depressive episodes: Secondary | ICD-10-CM | POA: Diagnosis not present

## 2021-07-13 DIAGNOSIS — E039 Hypothyroidism, unspecified: Secondary | ICD-10-CM | POA: Diagnosis not present

## 2021-07-13 DIAGNOSIS — F0390 Unspecified dementia without behavioral disturbance: Secondary | ICD-10-CM | POA: Diagnosis not present

## 2021-07-13 DIAGNOSIS — M6281 Muscle weakness (generalized): Secondary | ICD-10-CM | POA: Diagnosis not present

## 2021-07-13 DIAGNOSIS — E569 Vitamin deficiency, unspecified: Secondary | ICD-10-CM | POA: Diagnosis not present

## 2021-07-13 DIAGNOSIS — R41841 Cognitive communication deficit: Secondary | ICD-10-CM | POA: Diagnosis not present

## 2021-07-14 DIAGNOSIS — F0390 Unspecified dementia without behavioral disturbance: Secondary | ICD-10-CM | POA: Diagnosis not present

## 2021-07-14 DIAGNOSIS — I10 Essential (primary) hypertension: Secondary | ICD-10-CM | POA: Diagnosis not present

## 2021-07-14 DIAGNOSIS — R2681 Unsteadiness on feet: Secondary | ICD-10-CM | POA: Diagnosis not present

## 2021-07-14 DIAGNOSIS — M6281 Muscle weakness (generalized): Secondary | ICD-10-CM | POA: Diagnosis not present

## 2021-07-14 DIAGNOSIS — F3289 Other specified depressive episodes: Secondary | ICD-10-CM | POA: Diagnosis not present

## 2021-07-14 DIAGNOSIS — K219 Gastro-esophageal reflux disease without esophagitis: Secondary | ICD-10-CM | POA: Diagnosis not present

## 2021-07-14 DIAGNOSIS — E039 Hypothyroidism, unspecified: Secondary | ICD-10-CM | POA: Diagnosis not present

## 2021-07-14 DIAGNOSIS — E569 Vitamin deficiency, unspecified: Secondary | ICD-10-CM | POA: Diagnosis not present

## 2021-07-14 DIAGNOSIS — R41841 Cognitive communication deficit: Secondary | ICD-10-CM | POA: Diagnosis not present

## 2021-07-15 DIAGNOSIS — R41841 Cognitive communication deficit: Secondary | ICD-10-CM | POA: Diagnosis not present

## 2021-07-15 DIAGNOSIS — F0390 Unspecified dementia without behavioral disturbance: Secondary | ICD-10-CM | POA: Diagnosis not present

## 2021-07-15 DIAGNOSIS — E569 Vitamin deficiency, unspecified: Secondary | ICD-10-CM | POA: Diagnosis not present

## 2021-07-15 DIAGNOSIS — E039 Hypothyroidism, unspecified: Secondary | ICD-10-CM | POA: Diagnosis not present

## 2021-07-15 DIAGNOSIS — K219 Gastro-esophageal reflux disease without esophagitis: Secondary | ICD-10-CM | POA: Diagnosis not present

## 2021-07-15 DIAGNOSIS — R2681 Unsteadiness on feet: Secondary | ICD-10-CM | POA: Diagnosis not present

## 2021-07-15 DIAGNOSIS — F3289 Other specified depressive episodes: Secondary | ICD-10-CM | POA: Diagnosis not present

## 2021-07-15 DIAGNOSIS — M6281 Muscle weakness (generalized): Secondary | ICD-10-CM | POA: Diagnosis not present

## 2021-07-15 DIAGNOSIS — I10 Essential (primary) hypertension: Secondary | ICD-10-CM | POA: Diagnosis not present

## 2021-07-18 DIAGNOSIS — E039 Hypothyroidism, unspecified: Secondary | ICD-10-CM | POA: Diagnosis not present

## 2021-07-18 DIAGNOSIS — F3289 Other specified depressive episodes: Secondary | ICD-10-CM | POA: Diagnosis not present

## 2021-07-18 DIAGNOSIS — R41841 Cognitive communication deficit: Secondary | ICD-10-CM | POA: Diagnosis not present

## 2021-07-18 DIAGNOSIS — M6281 Muscle weakness (generalized): Secondary | ICD-10-CM | POA: Diagnosis not present

## 2021-07-18 DIAGNOSIS — F0390 Unspecified dementia without behavioral disturbance: Secondary | ICD-10-CM | POA: Diagnosis not present

## 2021-07-18 DIAGNOSIS — K219 Gastro-esophageal reflux disease without esophagitis: Secondary | ICD-10-CM | POA: Diagnosis not present

## 2021-07-18 DIAGNOSIS — I10 Essential (primary) hypertension: Secondary | ICD-10-CM | POA: Diagnosis not present

## 2021-07-18 DIAGNOSIS — E569 Vitamin deficiency, unspecified: Secondary | ICD-10-CM | POA: Diagnosis not present

## 2021-07-18 DIAGNOSIS — R2681 Unsteadiness on feet: Secondary | ICD-10-CM | POA: Diagnosis not present

## 2021-07-19 DIAGNOSIS — F3289 Other specified depressive episodes: Secondary | ICD-10-CM | POA: Diagnosis not present

## 2021-07-19 DIAGNOSIS — F0390 Unspecified dementia without behavioral disturbance: Secondary | ICD-10-CM | POA: Diagnosis not present

## 2021-07-19 DIAGNOSIS — I10 Essential (primary) hypertension: Secondary | ICD-10-CM | POA: Diagnosis not present

## 2021-07-19 DIAGNOSIS — E569 Vitamin deficiency, unspecified: Secondary | ICD-10-CM | POA: Diagnosis not present

## 2021-07-19 DIAGNOSIS — R41841 Cognitive communication deficit: Secondary | ICD-10-CM | POA: Diagnosis not present

## 2021-07-19 DIAGNOSIS — K219 Gastro-esophageal reflux disease without esophagitis: Secondary | ICD-10-CM | POA: Diagnosis not present

## 2021-07-19 DIAGNOSIS — R2681 Unsteadiness on feet: Secondary | ICD-10-CM | POA: Diagnosis not present

## 2021-07-19 DIAGNOSIS — M6281 Muscle weakness (generalized): Secondary | ICD-10-CM | POA: Diagnosis not present

## 2021-07-19 DIAGNOSIS — E039 Hypothyroidism, unspecified: Secondary | ICD-10-CM | POA: Diagnosis not present

## 2021-07-20 DIAGNOSIS — I10 Essential (primary) hypertension: Secondary | ICD-10-CM | POA: Diagnosis not present

## 2021-07-20 DIAGNOSIS — F0390 Unspecified dementia without behavioral disturbance: Secondary | ICD-10-CM | POA: Diagnosis not present

## 2021-07-20 DIAGNOSIS — M6281 Muscle weakness (generalized): Secondary | ICD-10-CM | POA: Diagnosis not present

## 2021-07-20 DIAGNOSIS — R2681 Unsteadiness on feet: Secondary | ICD-10-CM | POA: Diagnosis not present

## 2021-07-20 DIAGNOSIS — E569 Vitamin deficiency, unspecified: Secondary | ICD-10-CM | POA: Diagnosis not present

## 2021-07-20 DIAGNOSIS — F3289 Other specified depressive episodes: Secondary | ICD-10-CM | POA: Diagnosis not present

## 2021-07-20 DIAGNOSIS — E039 Hypothyroidism, unspecified: Secondary | ICD-10-CM | POA: Diagnosis not present

## 2021-07-20 DIAGNOSIS — R41841 Cognitive communication deficit: Secondary | ICD-10-CM | POA: Diagnosis not present

## 2021-07-20 DIAGNOSIS — K219 Gastro-esophageal reflux disease without esophagitis: Secondary | ICD-10-CM | POA: Diagnosis not present

## 2021-07-21 DIAGNOSIS — F3289 Other specified depressive episodes: Secondary | ICD-10-CM | POA: Diagnosis not present

## 2021-07-21 DIAGNOSIS — R41841 Cognitive communication deficit: Secondary | ICD-10-CM | POA: Diagnosis not present

## 2021-07-21 DIAGNOSIS — M6281 Muscle weakness (generalized): Secondary | ICD-10-CM | POA: Diagnosis not present

## 2021-07-21 DIAGNOSIS — K219 Gastro-esophageal reflux disease without esophagitis: Secondary | ICD-10-CM | POA: Diagnosis not present

## 2021-07-21 DIAGNOSIS — E569 Vitamin deficiency, unspecified: Secondary | ICD-10-CM | POA: Diagnosis not present

## 2021-07-21 DIAGNOSIS — E039 Hypothyroidism, unspecified: Secondary | ICD-10-CM | POA: Diagnosis not present

## 2021-07-21 DIAGNOSIS — R2681 Unsteadiness on feet: Secondary | ICD-10-CM | POA: Diagnosis not present

## 2021-07-21 DIAGNOSIS — F0390 Unspecified dementia without behavioral disturbance: Secondary | ICD-10-CM | POA: Diagnosis not present

## 2021-07-21 DIAGNOSIS — I10 Essential (primary) hypertension: Secondary | ICD-10-CM | POA: Diagnosis not present

## 2021-07-22 DIAGNOSIS — F0390 Unspecified dementia without behavioral disturbance: Secondary | ICD-10-CM | POA: Diagnosis not present

## 2021-07-22 DIAGNOSIS — I10 Essential (primary) hypertension: Secondary | ICD-10-CM | POA: Diagnosis not present

## 2021-07-22 DIAGNOSIS — R41841 Cognitive communication deficit: Secondary | ICD-10-CM | POA: Diagnosis not present

## 2021-07-22 DIAGNOSIS — K219 Gastro-esophageal reflux disease without esophagitis: Secondary | ICD-10-CM | POA: Diagnosis not present

## 2021-07-22 DIAGNOSIS — M6281 Muscle weakness (generalized): Secondary | ICD-10-CM | POA: Diagnosis not present

## 2021-07-22 DIAGNOSIS — F3289 Other specified depressive episodes: Secondary | ICD-10-CM | POA: Diagnosis not present

## 2021-07-22 DIAGNOSIS — E039 Hypothyroidism, unspecified: Secondary | ICD-10-CM | POA: Diagnosis not present

## 2021-07-22 DIAGNOSIS — E569 Vitamin deficiency, unspecified: Secondary | ICD-10-CM | POA: Diagnosis not present

## 2021-07-22 DIAGNOSIS — R2681 Unsteadiness on feet: Secondary | ICD-10-CM | POA: Diagnosis not present

## 2021-07-25 DIAGNOSIS — F3289 Other specified depressive episodes: Secondary | ICD-10-CM | POA: Diagnosis not present

## 2021-07-25 DIAGNOSIS — E039 Hypothyroidism, unspecified: Secondary | ICD-10-CM | POA: Diagnosis not present

## 2021-07-25 DIAGNOSIS — I10 Essential (primary) hypertension: Secondary | ICD-10-CM | POA: Diagnosis not present

## 2021-07-25 DIAGNOSIS — M6281 Muscle weakness (generalized): Secondary | ICD-10-CM | POA: Diagnosis not present

## 2021-07-25 DIAGNOSIS — F0390 Unspecified dementia without behavioral disturbance: Secondary | ICD-10-CM | POA: Diagnosis not present

## 2021-07-25 DIAGNOSIS — K219 Gastro-esophageal reflux disease without esophagitis: Secondary | ICD-10-CM | POA: Diagnosis not present

## 2021-07-25 DIAGNOSIS — E569 Vitamin deficiency, unspecified: Secondary | ICD-10-CM | POA: Diagnosis not present

## 2021-07-25 DIAGNOSIS — R2681 Unsteadiness on feet: Secondary | ICD-10-CM | POA: Diagnosis not present

## 2021-07-25 DIAGNOSIS — R41841 Cognitive communication deficit: Secondary | ICD-10-CM | POA: Diagnosis not present

## 2021-07-26 DIAGNOSIS — E039 Hypothyroidism, unspecified: Secondary | ICD-10-CM | POA: Diagnosis not present

## 2021-07-26 DIAGNOSIS — F0390 Unspecified dementia without behavioral disturbance: Secondary | ICD-10-CM | POA: Diagnosis not present

## 2021-07-26 DIAGNOSIS — F3289 Other specified depressive episodes: Secondary | ICD-10-CM | POA: Diagnosis not present

## 2021-07-26 DIAGNOSIS — M6281 Muscle weakness (generalized): Secondary | ICD-10-CM | POA: Diagnosis not present

## 2021-07-26 DIAGNOSIS — I10 Essential (primary) hypertension: Secondary | ICD-10-CM | POA: Diagnosis not present

## 2021-07-26 DIAGNOSIS — R41841 Cognitive communication deficit: Secondary | ICD-10-CM | POA: Diagnosis not present

## 2021-07-26 DIAGNOSIS — K219 Gastro-esophageal reflux disease without esophagitis: Secondary | ICD-10-CM | POA: Diagnosis not present

## 2021-07-26 DIAGNOSIS — E569 Vitamin deficiency, unspecified: Secondary | ICD-10-CM | POA: Diagnosis not present

## 2021-07-26 DIAGNOSIS — R2681 Unsteadiness on feet: Secondary | ICD-10-CM | POA: Diagnosis not present

## 2021-07-27 DIAGNOSIS — R41841 Cognitive communication deficit: Secondary | ICD-10-CM | POA: Diagnosis not present

## 2021-07-27 DIAGNOSIS — M6281 Muscle weakness (generalized): Secondary | ICD-10-CM | POA: Diagnosis not present

## 2021-07-27 DIAGNOSIS — K219 Gastro-esophageal reflux disease without esophagitis: Secondary | ICD-10-CM | POA: Diagnosis not present

## 2021-07-27 DIAGNOSIS — I10 Essential (primary) hypertension: Secondary | ICD-10-CM | POA: Diagnosis not present

## 2021-07-27 DIAGNOSIS — F3289 Other specified depressive episodes: Secondary | ICD-10-CM | POA: Diagnosis not present

## 2021-07-27 DIAGNOSIS — F0390 Unspecified dementia without behavioral disturbance: Secondary | ICD-10-CM | POA: Diagnosis not present

## 2021-07-27 DIAGNOSIS — E039 Hypothyroidism, unspecified: Secondary | ICD-10-CM | POA: Diagnosis not present

## 2021-07-27 DIAGNOSIS — R2681 Unsteadiness on feet: Secondary | ICD-10-CM | POA: Diagnosis not present

## 2021-07-27 DIAGNOSIS — E569 Vitamin deficiency, unspecified: Secondary | ICD-10-CM | POA: Diagnosis not present

## 2021-07-28 DIAGNOSIS — M6281 Muscle weakness (generalized): Secondary | ICD-10-CM | POA: Diagnosis not present

## 2021-07-28 DIAGNOSIS — R2681 Unsteadiness on feet: Secondary | ICD-10-CM | POA: Diagnosis not present

## 2021-07-28 DIAGNOSIS — I10 Essential (primary) hypertension: Secondary | ICD-10-CM | POA: Diagnosis not present

## 2021-07-28 DIAGNOSIS — R41841 Cognitive communication deficit: Secondary | ICD-10-CM | POA: Diagnosis not present

## 2021-07-28 DIAGNOSIS — F3289 Other specified depressive episodes: Secondary | ICD-10-CM | POA: Diagnosis not present

## 2021-07-28 DIAGNOSIS — F0390 Unspecified dementia without behavioral disturbance: Secondary | ICD-10-CM | POA: Diagnosis not present

## 2021-07-28 DIAGNOSIS — E039 Hypothyroidism, unspecified: Secondary | ICD-10-CM | POA: Diagnosis not present

## 2021-07-28 DIAGNOSIS — K219 Gastro-esophageal reflux disease without esophagitis: Secondary | ICD-10-CM | POA: Diagnosis not present

## 2021-07-28 DIAGNOSIS — E569 Vitamin deficiency, unspecified: Secondary | ICD-10-CM | POA: Diagnosis not present

## 2021-07-29 DIAGNOSIS — F0390 Unspecified dementia without behavioral disturbance: Secondary | ICD-10-CM | POA: Diagnosis not present

## 2021-07-29 DIAGNOSIS — M6281 Muscle weakness (generalized): Secondary | ICD-10-CM | POA: Diagnosis not present

## 2021-07-29 DIAGNOSIS — K219 Gastro-esophageal reflux disease without esophagitis: Secondary | ICD-10-CM | POA: Diagnosis not present

## 2021-07-29 DIAGNOSIS — E039 Hypothyroidism, unspecified: Secondary | ICD-10-CM | POA: Diagnosis not present

## 2021-07-29 DIAGNOSIS — E569 Vitamin deficiency, unspecified: Secondary | ICD-10-CM | POA: Diagnosis not present

## 2021-07-29 DIAGNOSIS — F3289 Other specified depressive episodes: Secondary | ICD-10-CM | POA: Diagnosis not present

## 2021-07-29 DIAGNOSIS — R2681 Unsteadiness on feet: Secondary | ICD-10-CM | POA: Diagnosis not present

## 2021-07-29 DIAGNOSIS — R41841 Cognitive communication deficit: Secondary | ICD-10-CM | POA: Diagnosis not present

## 2021-07-29 DIAGNOSIS — I10 Essential (primary) hypertension: Secondary | ICD-10-CM | POA: Diagnosis not present

## 2021-08-01 DIAGNOSIS — E039 Hypothyroidism, unspecified: Secondary | ICD-10-CM | POA: Diagnosis not present

## 2021-08-01 DIAGNOSIS — F0390 Unspecified dementia without behavioral disturbance: Secondary | ICD-10-CM | POA: Diagnosis not present

## 2021-08-01 DIAGNOSIS — E569 Vitamin deficiency, unspecified: Secondary | ICD-10-CM | POA: Diagnosis not present

## 2021-08-01 DIAGNOSIS — F3289 Other specified depressive episodes: Secondary | ICD-10-CM | POA: Diagnosis not present

## 2021-08-01 DIAGNOSIS — I10 Essential (primary) hypertension: Secondary | ICD-10-CM | POA: Diagnosis not present

## 2021-08-01 DIAGNOSIS — R41841 Cognitive communication deficit: Secondary | ICD-10-CM | POA: Diagnosis not present

## 2021-08-01 DIAGNOSIS — R2681 Unsteadiness on feet: Secondary | ICD-10-CM | POA: Diagnosis not present

## 2021-08-01 DIAGNOSIS — K219 Gastro-esophageal reflux disease without esophagitis: Secondary | ICD-10-CM | POA: Diagnosis not present

## 2021-08-01 DIAGNOSIS — M6281 Muscle weakness (generalized): Secondary | ICD-10-CM | POA: Diagnosis not present

## 2021-08-02 DIAGNOSIS — I10 Essential (primary) hypertension: Secondary | ICD-10-CM | POA: Diagnosis not present

## 2021-08-02 DIAGNOSIS — F0390 Unspecified dementia without behavioral disturbance: Secondary | ICD-10-CM | POA: Diagnosis not present

## 2021-08-02 DIAGNOSIS — R41841 Cognitive communication deficit: Secondary | ICD-10-CM | POA: Diagnosis not present

## 2021-08-02 DIAGNOSIS — E039 Hypothyroidism, unspecified: Secondary | ICD-10-CM | POA: Diagnosis not present

## 2021-08-02 DIAGNOSIS — K219 Gastro-esophageal reflux disease without esophagitis: Secondary | ICD-10-CM | POA: Diagnosis not present

## 2021-08-02 DIAGNOSIS — E569 Vitamin deficiency, unspecified: Secondary | ICD-10-CM | POA: Diagnosis not present

## 2021-08-02 DIAGNOSIS — R2681 Unsteadiness on feet: Secondary | ICD-10-CM | POA: Diagnosis not present

## 2021-08-02 DIAGNOSIS — M6281 Muscle weakness (generalized): Secondary | ICD-10-CM | POA: Diagnosis not present

## 2021-08-02 DIAGNOSIS — F3289 Other specified depressive episodes: Secondary | ICD-10-CM | POA: Diagnosis not present

## 2021-08-03 DIAGNOSIS — F0280 Dementia in other diseases classified elsewhere without behavioral disturbance: Secondary | ICD-10-CM | POA: Diagnosis not present

## 2021-08-03 DIAGNOSIS — G4701 Insomnia due to medical condition: Secondary | ICD-10-CM | POA: Diagnosis not present

## 2021-08-03 DIAGNOSIS — G301 Alzheimer's disease with late onset: Secondary | ICD-10-CM | POA: Diagnosis not present

## 2021-08-03 DIAGNOSIS — F331 Major depressive disorder, recurrent, moderate: Secondary | ICD-10-CM | POA: Diagnosis not present

## 2021-08-12 DIAGNOSIS — R300 Dysuria: Secondary | ICD-10-CM | POA: Diagnosis not present

## 2021-08-12 DIAGNOSIS — N189 Chronic kidney disease, unspecified: Secondary | ICD-10-CM | POA: Diagnosis not present

## 2021-08-12 DIAGNOSIS — F339 Major depressive disorder, recurrent, unspecified: Secondary | ICD-10-CM | POA: Diagnosis not present

## 2021-08-12 DIAGNOSIS — F0390 Unspecified dementia without behavioral disturbance: Secondary | ICD-10-CM | POA: Diagnosis not present

## 2021-08-12 DIAGNOSIS — R443 Hallucinations, unspecified: Secondary | ICD-10-CM | POA: Diagnosis not present

## 2021-08-12 DIAGNOSIS — D649 Anemia, unspecified: Secondary | ICD-10-CM | POA: Diagnosis not present

## 2021-08-13 DIAGNOSIS — R4182 Altered mental status, unspecified: Secondary | ICD-10-CM | POA: Diagnosis not present

## 2021-08-15 DIAGNOSIS — N189 Chronic kidney disease, unspecified: Secondary | ICD-10-CM | POA: Diagnosis not present

## 2021-08-15 DIAGNOSIS — F339 Major depressive disorder, recurrent, unspecified: Secondary | ICD-10-CM | POA: Diagnosis not present

## 2021-08-15 DIAGNOSIS — N390 Urinary tract infection, site not specified: Secondary | ICD-10-CM | POA: Diagnosis not present

## 2021-08-15 DIAGNOSIS — R300 Dysuria: Secondary | ICD-10-CM | POA: Diagnosis not present

## 2021-08-15 DIAGNOSIS — D649 Anemia, unspecified: Secondary | ICD-10-CM | POA: Diagnosis not present

## 2021-08-15 DIAGNOSIS — R443 Hallucinations, unspecified: Secondary | ICD-10-CM | POA: Diagnosis not present

## 2021-08-17 DIAGNOSIS — N390 Urinary tract infection, site not specified: Secondary | ICD-10-CM | POA: Diagnosis not present

## 2021-08-17 DIAGNOSIS — R443 Hallucinations, unspecified: Secondary | ICD-10-CM | POA: Diagnosis not present

## 2021-08-17 DIAGNOSIS — D649 Anemia, unspecified: Secondary | ICD-10-CM | POA: Diagnosis not present

## 2021-08-17 DIAGNOSIS — N189 Chronic kidney disease, unspecified: Secondary | ICD-10-CM | POA: Diagnosis not present

## 2021-08-17 DIAGNOSIS — F339 Major depressive disorder, recurrent, unspecified: Secondary | ICD-10-CM | POA: Diagnosis not present

## 2021-09-07 DIAGNOSIS — G301 Alzheimer's disease with late onset: Secondary | ICD-10-CM | POA: Diagnosis not present

## 2021-09-07 DIAGNOSIS — F0280 Dementia in other diseases classified elsewhere without behavioral disturbance: Secondary | ICD-10-CM | POA: Diagnosis not present

## 2021-09-07 DIAGNOSIS — G4701 Insomnia due to medical condition: Secondary | ICD-10-CM | POA: Diagnosis not present

## 2021-09-07 DIAGNOSIS — F331 Major depressive disorder, recurrent, moderate: Secondary | ICD-10-CM | POA: Diagnosis not present

## 2021-09-08 DIAGNOSIS — K219 Gastro-esophageal reflux disease without esophagitis: Secondary | ICD-10-CM | POA: Diagnosis not present

## 2021-09-08 DIAGNOSIS — F339 Major depressive disorder, recurrent, unspecified: Secondary | ICD-10-CM | POA: Diagnosis not present

## 2021-09-08 DIAGNOSIS — R443 Hallucinations, unspecified: Secondary | ICD-10-CM | POA: Diagnosis not present

## 2021-09-08 DIAGNOSIS — J45909 Unspecified asthma, uncomplicated: Secondary | ICD-10-CM | POA: Diagnosis not present

## 2021-09-08 DIAGNOSIS — E039 Hypothyroidism, unspecified: Secondary | ICD-10-CM | POA: Diagnosis not present

## 2021-09-08 DIAGNOSIS — U071 COVID-19: Secondary | ICD-10-CM | POA: Diagnosis not present

## 2021-09-08 DIAGNOSIS — G8929 Other chronic pain: Secondary | ICD-10-CM | POA: Diagnosis not present

## 2021-09-08 DIAGNOSIS — I130 Hypertensive heart and chronic kidney disease with heart failure and stage 1 through stage 4 chronic kidney disease, or unspecified chronic kidney disease: Secondary | ICD-10-CM | POA: Diagnosis not present

## 2021-09-08 DIAGNOSIS — F0390 Unspecified dementia without behavioral disturbance: Secondary | ICD-10-CM | POA: Diagnosis not present

## 2021-09-21 DIAGNOSIS — R2681 Unsteadiness on feet: Secondary | ICD-10-CM | POA: Diagnosis not present

## 2021-09-21 DIAGNOSIS — M6259 Muscle wasting and atrophy, not elsewhere classified, multiple sites: Secondary | ICD-10-CM | POA: Diagnosis not present

## 2021-09-21 DIAGNOSIS — G4701 Insomnia due to medical condition: Secondary | ICD-10-CM | POA: Diagnosis not present

## 2021-09-21 DIAGNOSIS — E569 Vitamin deficiency, unspecified: Secondary | ICD-10-CM | POA: Diagnosis not present

## 2021-09-21 DIAGNOSIS — K219 Gastro-esophageal reflux disease without esophagitis: Secondary | ICD-10-CM | POA: Diagnosis not present

## 2021-09-21 DIAGNOSIS — F331 Major depressive disorder, recurrent, moderate: Secondary | ICD-10-CM | POA: Diagnosis not present

## 2021-09-21 DIAGNOSIS — R300 Dysuria: Secondary | ICD-10-CM | POA: Diagnosis not present

## 2021-09-21 DIAGNOSIS — E039 Hypothyroidism, unspecified: Secondary | ICD-10-CM | POA: Diagnosis not present

## 2021-09-21 DIAGNOSIS — I10 Essential (primary) hypertension: Secondary | ICD-10-CM | POA: Diagnosis not present

## 2021-09-21 DIAGNOSIS — F0280 Dementia in other diseases classified elsewhere without behavioral disturbance: Secondary | ICD-10-CM | POA: Diagnosis not present

## 2021-09-21 DIAGNOSIS — G301 Alzheimer's disease with late onset: Secondary | ICD-10-CM | POA: Diagnosis not present

## 2021-09-21 DIAGNOSIS — F0390 Unspecified dementia without behavioral disturbance: Secondary | ICD-10-CM | POA: Diagnosis not present

## 2021-09-21 DIAGNOSIS — Z8616 Personal history of COVID-19: Secondary | ICD-10-CM | POA: Diagnosis not present

## 2021-09-21 DIAGNOSIS — F3289 Other specified depressive episodes: Secondary | ICD-10-CM | POA: Diagnosis not present

## 2021-09-22 DIAGNOSIS — N390 Urinary tract infection, site not specified: Secondary | ICD-10-CM | POA: Diagnosis not present

## 2021-09-23 DIAGNOSIS — N390 Urinary tract infection, site not specified: Secondary | ICD-10-CM | POA: Diagnosis not present

## 2021-09-24 DIAGNOSIS — K219 Gastro-esophageal reflux disease without esophagitis: Secondary | ICD-10-CM | POA: Diagnosis not present

## 2021-09-24 DIAGNOSIS — I10 Essential (primary) hypertension: Secondary | ICD-10-CM | POA: Diagnosis not present

## 2021-09-24 DIAGNOSIS — F3289 Other specified depressive episodes: Secondary | ICD-10-CM | POA: Diagnosis not present

## 2021-09-24 DIAGNOSIS — F0390 Unspecified dementia without behavioral disturbance: Secondary | ICD-10-CM | POA: Diagnosis not present

## 2021-09-24 DIAGNOSIS — E039 Hypothyroidism, unspecified: Secondary | ICD-10-CM | POA: Diagnosis not present

## 2021-09-24 DIAGNOSIS — Z8616 Personal history of COVID-19: Secondary | ICD-10-CM | POA: Diagnosis not present

## 2021-09-24 DIAGNOSIS — E569 Vitamin deficiency, unspecified: Secondary | ICD-10-CM | POA: Diagnosis not present

## 2021-09-24 DIAGNOSIS — R2681 Unsteadiness on feet: Secondary | ICD-10-CM | POA: Diagnosis not present

## 2021-09-24 DIAGNOSIS — M6259 Muscle wasting and atrophy, not elsewhere classified, multiple sites: Secondary | ICD-10-CM | POA: Diagnosis not present

## 2021-09-25 DIAGNOSIS — M6259 Muscle wasting and atrophy, not elsewhere classified, multiple sites: Secondary | ICD-10-CM | POA: Diagnosis not present

## 2021-09-25 DIAGNOSIS — E569 Vitamin deficiency, unspecified: Secondary | ICD-10-CM | POA: Diagnosis not present

## 2021-09-25 DIAGNOSIS — K219 Gastro-esophageal reflux disease without esophagitis: Secondary | ICD-10-CM | POA: Diagnosis not present

## 2021-09-25 DIAGNOSIS — I10 Essential (primary) hypertension: Secondary | ICD-10-CM | POA: Diagnosis not present

## 2021-09-25 DIAGNOSIS — F3289 Other specified depressive episodes: Secondary | ICD-10-CM | POA: Diagnosis not present

## 2021-09-25 DIAGNOSIS — F0390 Unspecified dementia without behavioral disturbance: Secondary | ICD-10-CM | POA: Diagnosis not present

## 2021-09-25 DIAGNOSIS — E039 Hypothyroidism, unspecified: Secondary | ICD-10-CM | POA: Diagnosis not present

## 2021-09-25 DIAGNOSIS — R2681 Unsteadiness on feet: Secondary | ICD-10-CM | POA: Diagnosis not present

## 2021-09-25 DIAGNOSIS — Z8616 Personal history of COVID-19: Secondary | ICD-10-CM | POA: Diagnosis not present

## 2021-09-26 DIAGNOSIS — K219 Gastro-esophageal reflux disease without esophagitis: Secondary | ICD-10-CM | POA: Diagnosis not present

## 2021-09-26 DIAGNOSIS — E039 Hypothyroidism, unspecified: Secondary | ICD-10-CM | POA: Diagnosis not present

## 2021-09-26 DIAGNOSIS — W06XXXA Fall from bed, initial encounter: Secondary | ICD-10-CM | POA: Diagnosis not present

## 2021-09-26 DIAGNOSIS — E569 Vitamin deficiency, unspecified: Secondary | ICD-10-CM | POA: Diagnosis not present

## 2021-09-26 DIAGNOSIS — M6281 Muscle weakness (generalized): Secondary | ICD-10-CM | POA: Diagnosis not present

## 2021-09-26 DIAGNOSIS — R2681 Unsteadiness on feet: Secondary | ICD-10-CM | POA: Diagnosis not present

## 2021-09-26 DIAGNOSIS — M6259 Muscle wasting and atrophy, not elsewhere classified, multiple sites: Secondary | ICD-10-CM | POA: Diagnosis not present

## 2021-09-26 DIAGNOSIS — R300 Dysuria: Secondary | ICD-10-CM | POA: Diagnosis not present

## 2021-09-26 DIAGNOSIS — F3289 Other specified depressive episodes: Secondary | ICD-10-CM | POA: Diagnosis not present

## 2021-09-26 DIAGNOSIS — F0390 Unspecified dementia without behavioral disturbance: Secondary | ICD-10-CM | POA: Diagnosis not present

## 2021-09-26 DIAGNOSIS — I10 Essential (primary) hypertension: Secondary | ICD-10-CM | POA: Diagnosis not present

## 2021-09-26 DIAGNOSIS — Z8616 Personal history of COVID-19: Secondary | ICD-10-CM | POA: Diagnosis not present

## 2021-09-28 DIAGNOSIS — Z8616 Personal history of COVID-19: Secondary | ICD-10-CM | POA: Diagnosis not present

## 2021-09-28 DIAGNOSIS — R451 Restlessness and agitation: Secondary | ICD-10-CM | POA: Diagnosis not present

## 2021-09-28 DIAGNOSIS — F0390 Unspecified dementia without behavioral disturbance: Secondary | ICD-10-CM | POA: Diagnosis not present

## 2021-09-28 DIAGNOSIS — E039 Hypothyroidism, unspecified: Secondary | ICD-10-CM | POA: Diagnosis not present

## 2021-09-28 DIAGNOSIS — R2681 Unsteadiness on feet: Secondary | ICD-10-CM | POA: Diagnosis not present

## 2021-09-28 DIAGNOSIS — G301 Alzheimer's disease with late onset: Secondary | ICD-10-CM | POA: Diagnosis not present

## 2021-09-28 DIAGNOSIS — F3289 Other specified depressive episodes: Secondary | ICD-10-CM | POA: Diagnosis not present

## 2021-09-28 DIAGNOSIS — E569 Vitamin deficiency, unspecified: Secondary | ICD-10-CM | POA: Diagnosis not present

## 2021-09-28 DIAGNOSIS — K219 Gastro-esophageal reflux disease without esophagitis: Secondary | ICD-10-CM | POA: Diagnosis not present

## 2021-09-28 DIAGNOSIS — F02B2 Dementia in other diseases classified elsewhere, moderate, with psychotic disturbance: Secondary | ICD-10-CM | POA: Diagnosis not present

## 2021-09-28 DIAGNOSIS — M6259 Muscle wasting and atrophy, not elsewhere classified, multiple sites: Secondary | ICD-10-CM | POA: Diagnosis not present

## 2021-09-28 DIAGNOSIS — I10 Essential (primary) hypertension: Secondary | ICD-10-CM | POA: Diagnosis not present

## 2021-09-29 DIAGNOSIS — K219 Gastro-esophageal reflux disease without esophagitis: Secondary | ICD-10-CM | POA: Diagnosis not present

## 2021-09-29 DIAGNOSIS — M6259 Muscle wasting and atrophy, not elsewhere classified, multiple sites: Secondary | ICD-10-CM | POA: Diagnosis not present

## 2021-09-29 DIAGNOSIS — E569 Vitamin deficiency, unspecified: Secondary | ICD-10-CM | POA: Diagnosis not present

## 2021-09-29 DIAGNOSIS — R2681 Unsteadiness on feet: Secondary | ICD-10-CM | POA: Diagnosis not present

## 2021-09-29 DIAGNOSIS — F3289 Other specified depressive episodes: Secondary | ICD-10-CM | POA: Diagnosis not present

## 2021-09-29 DIAGNOSIS — F0390 Unspecified dementia without behavioral disturbance: Secondary | ICD-10-CM | POA: Diagnosis not present

## 2021-09-29 DIAGNOSIS — Z8616 Personal history of COVID-19: Secondary | ICD-10-CM | POA: Diagnosis not present

## 2021-09-29 DIAGNOSIS — I10 Essential (primary) hypertension: Secondary | ICD-10-CM | POA: Diagnosis not present

## 2021-09-29 DIAGNOSIS — E039 Hypothyroidism, unspecified: Secondary | ICD-10-CM | POA: Diagnosis not present

## 2021-09-30 DIAGNOSIS — K219 Gastro-esophageal reflux disease without esophagitis: Secondary | ICD-10-CM | POA: Diagnosis not present

## 2021-09-30 DIAGNOSIS — I10 Essential (primary) hypertension: Secondary | ICD-10-CM | POA: Diagnosis not present

## 2021-09-30 DIAGNOSIS — Z8616 Personal history of COVID-19: Secondary | ICD-10-CM | POA: Diagnosis not present

## 2021-09-30 DIAGNOSIS — E569 Vitamin deficiency, unspecified: Secondary | ICD-10-CM | POA: Diagnosis not present

## 2021-09-30 DIAGNOSIS — M6259 Muscle wasting and atrophy, not elsewhere classified, multiple sites: Secondary | ICD-10-CM | POA: Diagnosis not present

## 2021-09-30 DIAGNOSIS — E039 Hypothyroidism, unspecified: Secondary | ICD-10-CM | POA: Diagnosis not present

## 2021-09-30 DIAGNOSIS — F3289 Other specified depressive episodes: Secondary | ICD-10-CM | POA: Diagnosis not present

## 2021-09-30 DIAGNOSIS — F0390 Unspecified dementia without behavioral disturbance: Secondary | ICD-10-CM | POA: Diagnosis not present

## 2021-09-30 DIAGNOSIS — R2681 Unsteadiness on feet: Secondary | ICD-10-CM | POA: Diagnosis not present

## 2021-10-03 DIAGNOSIS — E039 Hypothyroidism, unspecified: Secondary | ICD-10-CM | POA: Diagnosis not present

## 2021-10-03 DIAGNOSIS — M6259 Muscle wasting and atrophy, not elsewhere classified, multiple sites: Secondary | ICD-10-CM | POA: Diagnosis not present

## 2021-10-03 DIAGNOSIS — E569 Vitamin deficiency, unspecified: Secondary | ICD-10-CM | POA: Diagnosis not present

## 2021-10-03 DIAGNOSIS — Z8616 Personal history of COVID-19: Secondary | ICD-10-CM | POA: Diagnosis not present

## 2021-10-03 DIAGNOSIS — F0390 Unspecified dementia without behavioral disturbance: Secondary | ICD-10-CM | POA: Diagnosis not present

## 2021-10-03 DIAGNOSIS — F3289 Other specified depressive episodes: Secondary | ICD-10-CM | POA: Diagnosis not present

## 2021-10-03 DIAGNOSIS — I10 Essential (primary) hypertension: Secondary | ICD-10-CM | POA: Diagnosis not present

## 2021-10-03 DIAGNOSIS — K219 Gastro-esophageal reflux disease without esophagitis: Secondary | ICD-10-CM | POA: Diagnosis not present

## 2021-10-03 DIAGNOSIS — R2681 Unsteadiness on feet: Secondary | ICD-10-CM | POA: Diagnosis not present

## 2021-10-04 DIAGNOSIS — E039 Hypothyroidism, unspecified: Secondary | ICD-10-CM | POA: Diagnosis not present

## 2021-10-04 DIAGNOSIS — K219 Gastro-esophageal reflux disease without esophagitis: Secondary | ICD-10-CM | POA: Diagnosis not present

## 2021-10-04 DIAGNOSIS — E569 Vitamin deficiency, unspecified: Secondary | ICD-10-CM | POA: Diagnosis not present

## 2021-10-04 DIAGNOSIS — F3289 Other specified depressive episodes: Secondary | ICD-10-CM | POA: Diagnosis not present

## 2021-10-04 DIAGNOSIS — Z8616 Personal history of COVID-19: Secondary | ICD-10-CM | POA: Diagnosis not present

## 2021-10-04 DIAGNOSIS — R2681 Unsteadiness on feet: Secondary | ICD-10-CM | POA: Diagnosis not present

## 2021-10-04 DIAGNOSIS — I10 Essential (primary) hypertension: Secondary | ICD-10-CM | POA: Diagnosis not present

## 2021-10-04 DIAGNOSIS — M6259 Muscle wasting and atrophy, not elsewhere classified, multiple sites: Secondary | ICD-10-CM | POA: Diagnosis not present

## 2021-10-04 DIAGNOSIS — F0390 Unspecified dementia without behavioral disturbance: Secondary | ICD-10-CM | POA: Diagnosis not present

## 2021-10-05 DIAGNOSIS — F0390 Unspecified dementia without behavioral disturbance: Secondary | ICD-10-CM | POA: Diagnosis not present

## 2021-10-05 DIAGNOSIS — G301 Alzheimer's disease with late onset: Secondary | ICD-10-CM | POA: Diagnosis not present

## 2021-10-05 DIAGNOSIS — G4701 Insomnia due to medical condition: Secondary | ICD-10-CM | POA: Diagnosis not present

## 2021-10-05 DIAGNOSIS — K219 Gastro-esophageal reflux disease without esophagitis: Secondary | ICD-10-CM | POA: Diagnosis not present

## 2021-10-05 DIAGNOSIS — E039 Hypothyroidism, unspecified: Secondary | ICD-10-CM | POA: Diagnosis not present

## 2021-10-05 DIAGNOSIS — Z8616 Personal history of COVID-19: Secondary | ICD-10-CM | POA: Diagnosis not present

## 2021-10-05 DIAGNOSIS — F3289 Other specified depressive episodes: Secondary | ICD-10-CM | POA: Diagnosis not present

## 2021-10-05 DIAGNOSIS — F02B2 Dementia in other diseases classified elsewhere, moderate, with psychotic disturbance: Secondary | ICD-10-CM | POA: Diagnosis not present

## 2021-10-05 DIAGNOSIS — R451 Restlessness and agitation: Secondary | ICD-10-CM | POA: Diagnosis not present

## 2021-10-05 DIAGNOSIS — F331 Major depressive disorder, recurrent, moderate: Secondary | ICD-10-CM | POA: Diagnosis not present

## 2021-10-05 DIAGNOSIS — E569 Vitamin deficiency, unspecified: Secondary | ICD-10-CM | POA: Diagnosis not present

## 2021-10-05 DIAGNOSIS — I10 Essential (primary) hypertension: Secondary | ICD-10-CM | POA: Diagnosis not present

## 2021-10-05 DIAGNOSIS — M6259 Muscle wasting and atrophy, not elsewhere classified, multiple sites: Secondary | ICD-10-CM | POA: Diagnosis not present

## 2021-10-05 DIAGNOSIS — R2681 Unsteadiness on feet: Secondary | ICD-10-CM | POA: Diagnosis not present

## 2021-10-06 DIAGNOSIS — E569 Vitamin deficiency, unspecified: Secondary | ICD-10-CM | POA: Diagnosis not present

## 2021-10-06 DIAGNOSIS — I10 Essential (primary) hypertension: Secondary | ICD-10-CM | POA: Diagnosis not present

## 2021-10-06 DIAGNOSIS — F3289 Other specified depressive episodes: Secondary | ICD-10-CM | POA: Diagnosis not present

## 2021-10-06 DIAGNOSIS — R2681 Unsteadiness on feet: Secondary | ICD-10-CM | POA: Diagnosis not present

## 2021-10-06 DIAGNOSIS — E039 Hypothyroidism, unspecified: Secondary | ICD-10-CM | POA: Diagnosis not present

## 2021-10-06 DIAGNOSIS — M6259 Muscle wasting and atrophy, not elsewhere classified, multiple sites: Secondary | ICD-10-CM | POA: Diagnosis not present

## 2021-10-06 DIAGNOSIS — K219 Gastro-esophageal reflux disease without esophagitis: Secondary | ICD-10-CM | POA: Diagnosis not present

## 2021-10-06 DIAGNOSIS — Z8616 Personal history of COVID-19: Secondary | ICD-10-CM | POA: Diagnosis not present

## 2021-10-06 DIAGNOSIS — F0390 Unspecified dementia without behavioral disturbance: Secondary | ICD-10-CM | POA: Diagnosis not present

## 2021-10-08 DIAGNOSIS — E039 Hypothyroidism, unspecified: Secondary | ICD-10-CM | POA: Diagnosis not present

## 2021-10-08 DIAGNOSIS — E569 Vitamin deficiency, unspecified: Secondary | ICD-10-CM | POA: Diagnosis not present

## 2021-10-08 DIAGNOSIS — R2681 Unsteadiness on feet: Secondary | ICD-10-CM | POA: Diagnosis not present

## 2021-10-08 DIAGNOSIS — F3289 Other specified depressive episodes: Secondary | ICD-10-CM | POA: Diagnosis not present

## 2021-10-08 DIAGNOSIS — Z8616 Personal history of COVID-19: Secondary | ICD-10-CM | POA: Diagnosis not present

## 2021-10-08 DIAGNOSIS — K219 Gastro-esophageal reflux disease without esophagitis: Secondary | ICD-10-CM | POA: Diagnosis not present

## 2021-10-08 DIAGNOSIS — F0390 Unspecified dementia without behavioral disturbance: Secondary | ICD-10-CM | POA: Diagnosis not present

## 2021-10-08 DIAGNOSIS — I10 Essential (primary) hypertension: Secondary | ICD-10-CM | POA: Diagnosis not present

## 2021-10-08 DIAGNOSIS — M6259 Muscle wasting and atrophy, not elsewhere classified, multiple sites: Secondary | ICD-10-CM | POA: Diagnosis not present

## 2021-10-10 DIAGNOSIS — K219 Gastro-esophageal reflux disease without esophagitis: Secondary | ICD-10-CM | POA: Diagnosis not present

## 2021-10-10 DIAGNOSIS — F3289 Other specified depressive episodes: Secondary | ICD-10-CM | POA: Diagnosis not present

## 2021-10-10 DIAGNOSIS — F0390 Unspecified dementia without behavioral disturbance: Secondary | ICD-10-CM | POA: Diagnosis not present

## 2021-10-10 DIAGNOSIS — E039 Hypothyroidism, unspecified: Secondary | ICD-10-CM | POA: Diagnosis not present

## 2021-10-10 DIAGNOSIS — I10 Essential (primary) hypertension: Secondary | ICD-10-CM | POA: Diagnosis not present

## 2021-10-10 DIAGNOSIS — R2681 Unsteadiness on feet: Secondary | ICD-10-CM | POA: Diagnosis not present

## 2021-10-10 DIAGNOSIS — M6259 Muscle wasting and atrophy, not elsewhere classified, multiple sites: Secondary | ICD-10-CM | POA: Diagnosis not present

## 2021-10-10 DIAGNOSIS — Z8616 Personal history of COVID-19: Secondary | ICD-10-CM | POA: Diagnosis not present

## 2021-10-10 DIAGNOSIS — E569 Vitamin deficiency, unspecified: Secondary | ICD-10-CM | POA: Diagnosis not present

## 2021-10-11 DIAGNOSIS — F3289 Other specified depressive episodes: Secondary | ICD-10-CM | POA: Diagnosis not present

## 2021-10-11 DIAGNOSIS — E039 Hypothyroidism, unspecified: Secondary | ICD-10-CM | POA: Diagnosis not present

## 2021-10-11 DIAGNOSIS — M6259 Muscle wasting and atrophy, not elsewhere classified, multiple sites: Secondary | ICD-10-CM | POA: Diagnosis not present

## 2021-10-11 DIAGNOSIS — R2681 Unsteadiness on feet: Secondary | ICD-10-CM | POA: Diagnosis not present

## 2021-10-11 DIAGNOSIS — E569 Vitamin deficiency, unspecified: Secondary | ICD-10-CM | POA: Diagnosis not present

## 2021-10-11 DIAGNOSIS — F0390 Unspecified dementia without behavioral disturbance: Secondary | ICD-10-CM | POA: Diagnosis not present

## 2021-10-11 DIAGNOSIS — K219 Gastro-esophageal reflux disease without esophagitis: Secondary | ICD-10-CM | POA: Diagnosis not present

## 2021-10-11 DIAGNOSIS — I10 Essential (primary) hypertension: Secondary | ICD-10-CM | POA: Diagnosis not present

## 2021-10-11 DIAGNOSIS — Z8616 Personal history of COVID-19: Secondary | ICD-10-CM | POA: Diagnosis not present

## 2021-10-12 DIAGNOSIS — R2681 Unsteadiness on feet: Secondary | ICD-10-CM | POA: Diagnosis not present

## 2021-10-12 DIAGNOSIS — E569 Vitamin deficiency, unspecified: Secondary | ICD-10-CM | POA: Diagnosis not present

## 2021-10-12 DIAGNOSIS — E039 Hypothyroidism, unspecified: Secondary | ICD-10-CM | POA: Diagnosis not present

## 2021-10-12 DIAGNOSIS — F3289 Other specified depressive episodes: Secondary | ICD-10-CM | POA: Diagnosis not present

## 2021-10-12 DIAGNOSIS — R52 Pain, unspecified: Secondary | ICD-10-CM | POA: Diagnosis not present

## 2021-10-12 DIAGNOSIS — Z8616 Personal history of COVID-19: Secondary | ICD-10-CM | POA: Diagnosis not present

## 2021-10-12 DIAGNOSIS — I10 Essential (primary) hypertension: Secondary | ICD-10-CM | POA: Diagnosis not present

## 2021-10-12 DIAGNOSIS — K219 Gastro-esophageal reflux disease without esophagitis: Secondary | ICD-10-CM | POA: Diagnosis not present

## 2021-10-12 DIAGNOSIS — M6259 Muscle wasting and atrophy, not elsewhere classified, multiple sites: Secondary | ICD-10-CM | POA: Diagnosis not present

## 2021-10-15 DIAGNOSIS — I10 Essential (primary) hypertension: Secondary | ICD-10-CM | POA: Diagnosis not present

## 2021-10-15 DIAGNOSIS — F3289 Other specified depressive episodes: Secondary | ICD-10-CM | POA: Diagnosis not present

## 2021-10-15 DIAGNOSIS — M6259 Muscle wasting and atrophy, not elsewhere classified, multiple sites: Secondary | ICD-10-CM | POA: Diagnosis not present

## 2021-10-15 DIAGNOSIS — Z8616 Personal history of COVID-19: Secondary | ICD-10-CM | POA: Diagnosis not present

## 2021-10-15 DIAGNOSIS — R52 Pain, unspecified: Secondary | ICD-10-CM | POA: Diagnosis not present

## 2021-10-15 DIAGNOSIS — E569 Vitamin deficiency, unspecified: Secondary | ICD-10-CM | POA: Diagnosis not present

## 2021-10-15 DIAGNOSIS — E039 Hypothyroidism, unspecified: Secondary | ICD-10-CM | POA: Diagnosis not present

## 2021-10-15 DIAGNOSIS — R2681 Unsteadiness on feet: Secondary | ICD-10-CM | POA: Diagnosis not present

## 2021-10-15 DIAGNOSIS — K219 Gastro-esophageal reflux disease without esophagitis: Secondary | ICD-10-CM | POA: Diagnosis not present

## 2021-10-16 DIAGNOSIS — F3289 Other specified depressive episodes: Secondary | ICD-10-CM | POA: Diagnosis not present

## 2021-10-16 DIAGNOSIS — Z8616 Personal history of COVID-19: Secondary | ICD-10-CM | POA: Diagnosis not present

## 2021-10-16 DIAGNOSIS — E039 Hypothyroidism, unspecified: Secondary | ICD-10-CM | POA: Diagnosis not present

## 2021-10-16 DIAGNOSIS — R2681 Unsteadiness on feet: Secondary | ICD-10-CM | POA: Diagnosis not present

## 2021-10-16 DIAGNOSIS — K219 Gastro-esophageal reflux disease without esophagitis: Secondary | ICD-10-CM | POA: Diagnosis not present

## 2021-10-16 DIAGNOSIS — E569 Vitamin deficiency, unspecified: Secondary | ICD-10-CM | POA: Diagnosis not present

## 2021-10-16 DIAGNOSIS — M6259 Muscle wasting and atrophy, not elsewhere classified, multiple sites: Secondary | ICD-10-CM | POA: Diagnosis not present

## 2021-10-16 DIAGNOSIS — R52 Pain, unspecified: Secondary | ICD-10-CM | POA: Diagnosis not present

## 2021-10-16 DIAGNOSIS — I10 Essential (primary) hypertension: Secondary | ICD-10-CM | POA: Diagnosis not present

## 2021-10-17 DIAGNOSIS — F3289 Other specified depressive episodes: Secondary | ICD-10-CM | POA: Diagnosis not present

## 2021-10-17 DIAGNOSIS — E569 Vitamin deficiency, unspecified: Secondary | ICD-10-CM | POA: Diagnosis not present

## 2021-10-17 DIAGNOSIS — I10 Essential (primary) hypertension: Secondary | ICD-10-CM | POA: Diagnosis not present

## 2021-10-17 DIAGNOSIS — K219 Gastro-esophageal reflux disease without esophagitis: Secondary | ICD-10-CM | POA: Diagnosis not present

## 2021-10-17 DIAGNOSIS — R2681 Unsteadiness on feet: Secondary | ICD-10-CM | POA: Diagnosis not present

## 2021-10-17 DIAGNOSIS — E039 Hypothyroidism, unspecified: Secondary | ICD-10-CM | POA: Diagnosis not present

## 2021-10-17 DIAGNOSIS — M6259 Muscle wasting and atrophy, not elsewhere classified, multiple sites: Secondary | ICD-10-CM | POA: Diagnosis not present

## 2021-10-17 DIAGNOSIS — R52 Pain, unspecified: Secondary | ICD-10-CM | POA: Diagnosis not present

## 2021-10-17 DIAGNOSIS — Z8616 Personal history of COVID-19: Secondary | ICD-10-CM | POA: Diagnosis not present

## 2021-10-18 DIAGNOSIS — Z8616 Personal history of COVID-19: Secondary | ICD-10-CM | POA: Diagnosis not present

## 2021-10-18 DIAGNOSIS — K219 Gastro-esophageal reflux disease without esophagitis: Secondary | ICD-10-CM | POA: Diagnosis not present

## 2021-10-18 DIAGNOSIS — M6259 Muscle wasting and atrophy, not elsewhere classified, multiple sites: Secondary | ICD-10-CM | POA: Diagnosis not present

## 2021-10-18 DIAGNOSIS — F3289 Other specified depressive episodes: Secondary | ICD-10-CM | POA: Diagnosis not present

## 2021-10-18 DIAGNOSIS — E039 Hypothyroidism, unspecified: Secondary | ICD-10-CM | POA: Diagnosis not present

## 2021-10-18 DIAGNOSIS — R2681 Unsteadiness on feet: Secondary | ICD-10-CM | POA: Diagnosis not present

## 2021-10-18 DIAGNOSIS — I10 Essential (primary) hypertension: Secondary | ICD-10-CM | POA: Diagnosis not present

## 2021-10-18 DIAGNOSIS — R52 Pain, unspecified: Secondary | ICD-10-CM | POA: Diagnosis not present

## 2021-10-18 DIAGNOSIS — E569 Vitamin deficiency, unspecified: Secondary | ICD-10-CM | POA: Diagnosis not present

## 2021-11-02 DIAGNOSIS — F331 Major depressive disorder, recurrent, moderate: Secondary | ICD-10-CM | POA: Diagnosis not present

## 2021-11-02 DIAGNOSIS — G301 Alzheimer's disease with late onset: Secondary | ICD-10-CM | POA: Diagnosis not present

## 2021-11-02 DIAGNOSIS — G4701 Insomnia due to medical condition: Secondary | ICD-10-CM | POA: Diagnosis not present

## 2021-11-02 DIAGNOSIS — R451 Restlessness and agitation: Secondary | ICD-10-CM | POA: Diagnosis not present

## 2021-11-02 DIAGNOSIS — F02B2 Dementia in other diseases classified elsewhere, moderate, with psychotic disturbance: Secondary | ICD-10-CM | POA: Diagnosis not present

## 2021-11-04 DIAGNOSIS — G8929 Other chronic pain: Secondary | ICD-10-CM | POA: Diagnosis not present

## 2021-11-04 DIAGNOSIS — M6281 Muscle weakness (generalized): Secondary | ICD-10-CM | POA: Diagnosis not present

## 2021-11-04 DIAGNOSIS — J45909 Unspecified asthma, uncomplicated: Secondary | ICD-10-CM | POA: Diagnosis not present

## 2021-11-04 DIAGNOSIS — F03A2 Unspecified dementia, mild, with psychotic disturbance: Secondary | ICD-10-CM | POA: Diagnosis not present

## 2021-11-10 DIAGNOSIS — B170 Acute delta-(super) infection of hepatitis B carrier: Secondary | ICD-10-CM | POA: Diagnosis not present

## 2021-11-10 DIAGNOSIS — Z79899 Other long term (current) drug therapy: Secondary | ICD-10-CM | POA: Diagnosis not present

## 2021-11-10 DIAGNOSIS — K5900 Constipation, unspecified: Secondary | ICD-10-CM | POA: Diagnosis not present

## 2021-11-10 DIAGNOSIS — E559 Vitamin D deficiency, unspecified: Secondary | ICD-10-CM | POA: Diagnosis not present

## 2021-11-10 DIAGNOSIS — E039 Hypothyroidism, unspecified: Secondary | ICD-10-CM | POA: Diagnosis not present

## 2021-11-10 DIAGNOSIS — I5021 Acute systolic (congestive) heart failure: Secondary | ICD-10-CM | POA: Diagnosis not present

## 2021-11-30 DIAGNOSIS — F331 Major depressive disorder, recurrent, moderate: Secondary | ICD-10-CM | POA: Diagnosis not present

## 2021-11-30 DIAGNOSIS — F02B2 Dementia in other diseases classified elsewhere, moderate, with psychotic disturbance: Secondary | ICD-10-CM | POA: Diagnosis not present

## 2021-11-30 DIAGNOSIS — G301 Alzheimer's disease with late onset: Secondary | ICD-10-CM | POA: Diagnosis not present

## 2021-12-21 ENCOUNTER — Observation Stay: Payer: Medicare HMO

## 2021-12-21 ENCOUNTER — Observation Stay (HOSPITAL_BASED_OUTPATIENT_CLINIC_OR_DEPARTMENT_OTHER)
Admit: 2021-12-21 | Discharge: 2021-12-21 | Disposition: A | Discharge status: Home / Self Care | Payer: Medicare HMO | Attending: Internal Medicine | Admitting: Internal Medicine

## 2021-12-21 ENCOUNTER — Emergency Department: Payer: Medicare HMO

## 2021-12-21 ENCOUNTER — Observation Stay
Admission: EM | Admit: 2021-12-21 | Discharge: 2021-12-22 | Disposition: A | Discharge status: Skilled Nursing Facility | Payer: Medicare HMO | Attending: Internal Medicine | Admitting: Internal Medicine

## 2021-12-21 DIAGNOSIS — J45909 Unspecified asthma, uncomplicated: Secondary | ICD-10-CM | POA: Diagnosis not present

## 2021-12-21 DIAGNOSIS — Z79899 Other long term (current) drug therapy: Secondary | ICD-10-CM | POA: Insufficient documentation

## 2021-12-21 DIAGNOSIS — E039 Hypothyroidism, unspecified: Secondary | ICD-10-CM | POA: Insufficient documentation

## 2021-12-21 DIAGNOSIS — I10 Essential (primary) hypertension: Secondary | ICD-10-CM | POA: Insufficient documentation

## 2021-12-21 DIAGNOSIS — F0390 Unspecified dementia without behavioral disturbance: Secondary | ICD-10-CM | POA: Diagnosis not present

## 2021-12-21 DIAGNOSIS — G459 Transient cerebral ischemic attack, unspecified: Secondary | ICD-10-CM

## 2021-12-21 DIAGNOSIS — Z20822 Contact with and (suspected) exposure to covid-19: Secondary | ICD-10-CM | POA: Diagnosis not present

## 2021-12-21 DIAGNOSIS — N3000 Acute cystitis without hematuria: Secondary | ICD-10-CM

## 2021-12-21 DIAGNOSIS — Z8673 Personal history of transient ischemic attack (TIA), and cerebral infarction without residual deficits: Secondary | ICD-10-CM | POA: Insufficient documentation

## 2021-12-21 DIAGNOSIS — N390 Urinary tract infection, site not specified: Secondary | ICD-10-CM | POA: Diagnosis present

## 2021-12-21 DIAGNOSIS — R4182 Altered mental status, unspecified: Secondary | ICD-10-CM | POA: Diagnosis present

## 2021-12-21 DIAGNOSIS — I2510 Atherosclerotic heart disease of native coronary artery without angina pectoris: Secondary | ICD-10-CM | POA: Insufficient documentation

## 2021-12-21 DIAGNOSIS — D329 Benign neoplasm of meninges, unspecified: Secondary | ICD-10-CM

## 2021-12-21 LAB — URINALYSIS, ROUTINE W REFLEX MICROSCOPIC
Bilirubin Urine: NEGATIVE
Glucose, UA: NEGATIVE mg/dL
Ketones, ur: NEGATIVE mg/dL
Nitrite: NEGATIVE
Protein, ur: NEGATIVE mg/dL
Specific Gravity, Urine: 1.011 (ref 1.005–1.030)
WBC, UA: >50 WBC/hpf — ABNORMAL HIGH (ref 0–5)
pH: 7 (ref 5.0–8.0)

## 2021-12-21 LAB — ECHOCARDIOGRAM COMPLETE
AR max vel: 1.67 cm2
AV Area VTI: 1.49 cm2
AV Area mean vel: 1.65 cm2
AV Mean grad: 3 mmHg
AV Peak grad: 6.4 mmHg
Ao pk vel: 1.26 m/s
Area-P 1/2: 6.22 cm2
MV VTI: 1.77 cm2
S' Lateral: 2.51 cm

## 2021-12-21 LAB — URINE DRUG SCREEN, QUALITATIVE (ARMC ONLY)
Amphetamines, Ur Screen: NOT DETECTED
Barbiturates, Ur Screen: NOT DETECTED
Benzodiazepine, Ur Scrn: NOT DETECTED
Cannabinoid 50 Ng, Ur ~~LOC~~: NOT DETECTED
Cocaine Metabolite,Ur ~~LOC~~: NOT DETECTED
MDMA (Ecstasy)Ur Screen: NOT DETECTED
Methadone Scn, Ur: NOT DETECTED
Opiate, Ur Screen: NOT DETECTED
Phencyclidine (PCP) Ur S: NOT DETECTED
Tricyclic, Ur Screen: POSITIVE — AB

## 2021-12-21 LAB — DIFFERENTIAL
Abs Immature Granulocytes: 0.01 10*3/uL (ref 0.00–0.07)
Basophils Absolute: 0 10*3/uL (ref 0.0–0.1)
Basophils Relative: 0 %
Eosinophils Absolute: 0.2 10*3/uL (ref 0.0–0.5)
Eosinophils Relative: 2 %
Immature Granulocytes: 0 %
Lymphocytes Relative: 32 %
Lymphs Abs: 2.6 10*3/uL (ref 0.7–4.0)
Monocytes Absolute: 0.7 10*3/uL (ref 0.1–1.0)
Monocytes Relative: 8 %
Neutro Abs: 4.7 10*3/uL (ref 1.7–7.7)
Neutrophils Relative %: 58 %

## 2021-12-21 LAB — COMPREHENSIVE METABOLIC PANEL
ALT: 11 U/L (ref 0–44)
AST: 20 U/L (ref 15–41)
Albumin: 3.4 g/dL — ABNORMAL LOW (ref 3.5–5.0)
Alkaline Phosphatase: 80 U/L (ref 38–126)
Anion gap: 7 (ref 5–15)
BUN: 14 mg/dL (ref 8–23)
CO2: 30 mmol/L (ref 22–32)
Calcium: 8.9 mg/dL (ref 8.9–10.3)
Chloride: 103 mmol/L (ref 98–111)
Creatinine, Ser: 0.75 mg/dL (ref 0.44–1.00)
GFR, Estimated: >60 mL/min (ref >60)
Glucose, Bld: 87 mg/dL (ref 70–99)
Potassium: 3.9 mmol/L (ref 3.5–5.1)
Sodium: 140 mmol/L (ref 135–145)
Total Bilirubin: 0.8 mg/dL (ref 0.3–1.2)
Total Protein: 7.4 g/dL (ref 6.5–8.1)

## 2021-12-21 LAB — TROPONIN I (HIGH SENSITIVITY)
Troponin I (High Sensitivity): 5 ng/L (ref <18)
Troponin I (High Sensitivity): 6 ng/L (ref <18)

## 2021-12-21 LAB — RESP PANEL BY RT-PCR (FLU A&B, COVID) ARPGX2
Influenza A by PCR: NEGATIVE
Influenza B by PCR: NEGATIVE
SARS Coronavirus 2 by RT PCR: NEGATIVE

## 2021-12-21 LAB — CBC
HCT: 40.3 % (ref 36.0–46.0)
Hemoglobin: 12.5 g/dL (ref 12.0–15.0)
MCH: 28.5 pg (ref 26.0–34.0)
MCHC: 31 g/dL (ref 30.0–36.0)
MCV: 92 fL (ref 80.0–100.0)
Platelets: 183 10*3/uL (ref 150–400)
RBC: 4.38 MIL/uL (ref 3.87–5.11)
RDW: 13.6 % (ref 11.5–15.5)
WBC: 8.2 10*3/uL (ref 4.0–10.5)
nRBC: 0 % (ref 0.0–0.2)

## 2021-12-21 LAB — APTT: aPTT: 35 seconds (ref 24–36)

## 2021-12-21 LAB — PROTIME-INR
INR: 1 (ref 0.8–1.2)
Prothrombin Time: 13.5 seconds (ref 11.4–15.2)

## 2021-12-21 LAB — T4, FREE: Free T4: 0.86 ng/dL (ref 0.61–1.12)

## 2021-12-21 LAB — ETHANOL: Alcohol, Ethyl (B): <10 mg/dL (ref <10)

## 2021-12-21 LAB — CBG MONITORING, ED: Glucose-Capillary: 86 mg/dL (ref 70–99)

## 2021-12-21 LAB — TSH: TSH: 8.878 u[IU]/mL — ABNORMAL HIGH (ref 0.350–4.500)

## 2021-12-21 IMAGING — CT CT HEAD W/O CM
4 series · 16 of 47 positions shown, 18 images · non-contrast
Comparison: CT head [DATE].

CLINICAL DATA: Neuro deficit, acute, stroke suspected



[Series 2: head wo · axial · 0.47mm/px · z∈[+313,+443]mm · 7 of 36 slices shown, 9 images]
[im 5/36  brain]
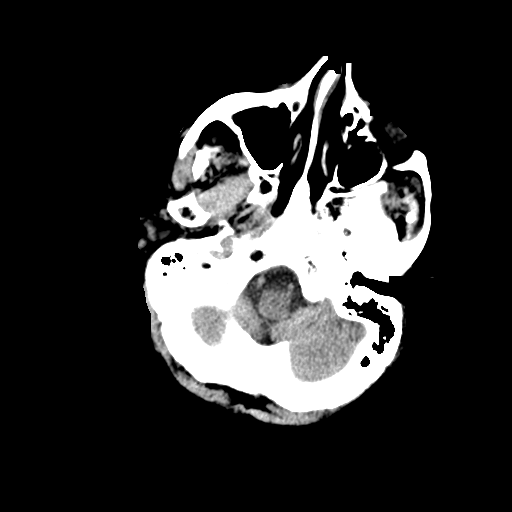
[im 5/36  bone]
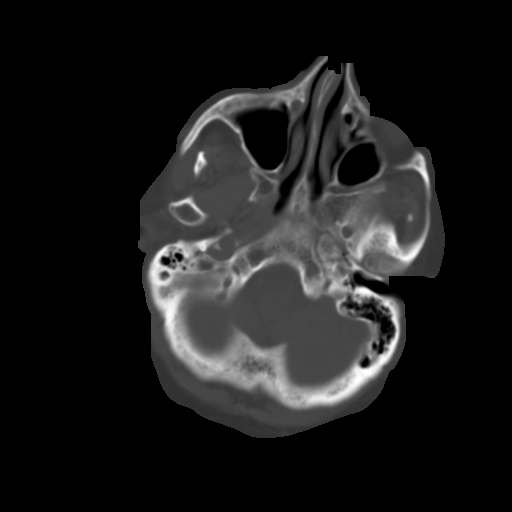
[im 9/36  brain]
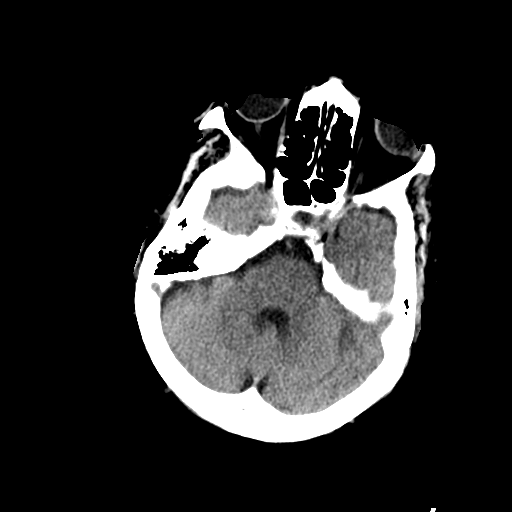
[im 14/36  brain]
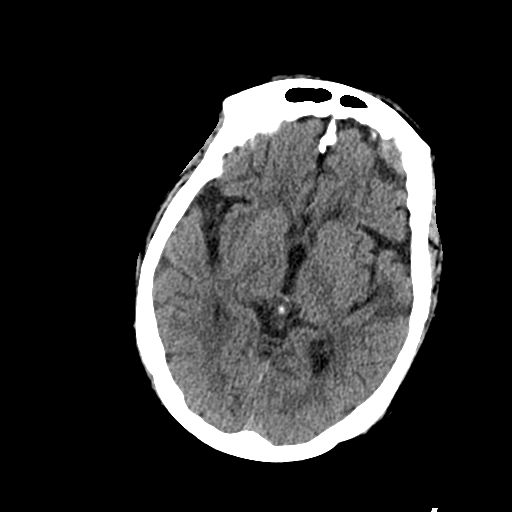
[im 18/36  brain]
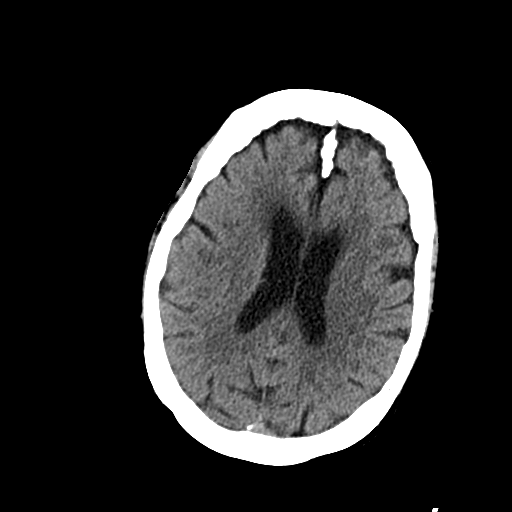
[im 22/36  brain]
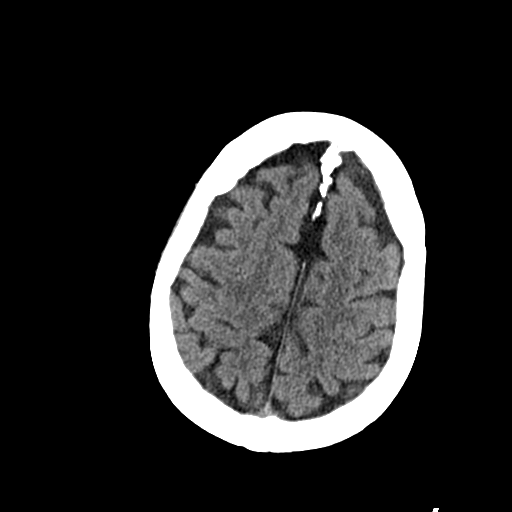
[im 22/36  bone]
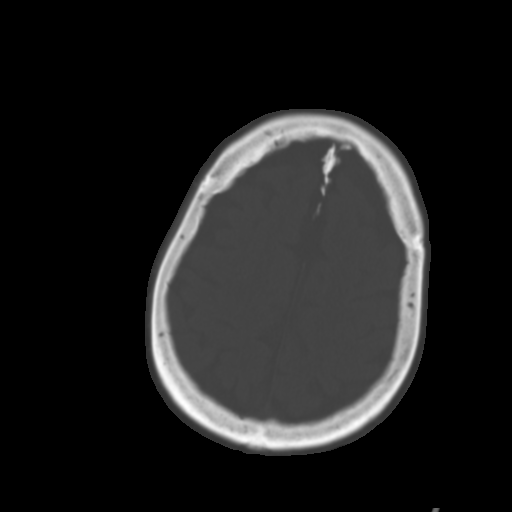
[im 27/36  brain]
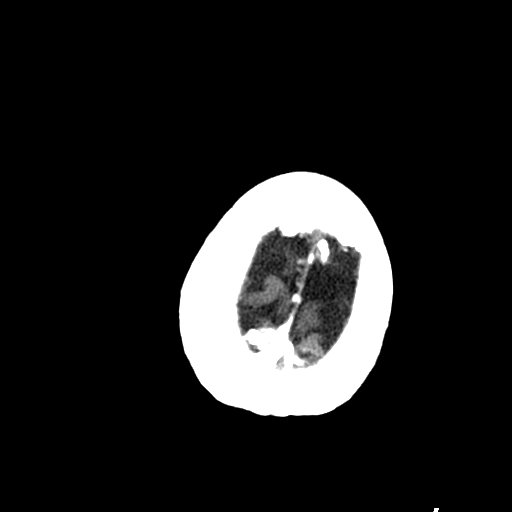
[im 31/36  brain]
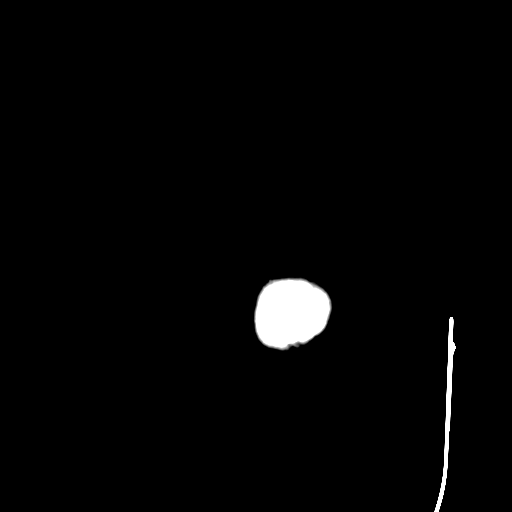

[Series 3: head bone · axial · 0.47mm/px · z∈[+309,+345]mm · 3 of 89 slices shown]
[im 9/89  bone]
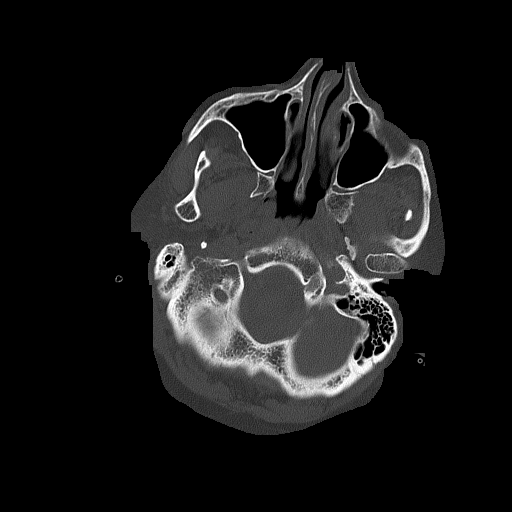
[im 18/89  bone]
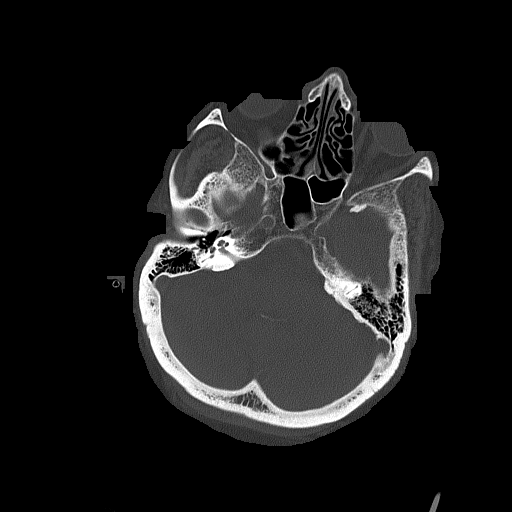
[im 27/89  bone]
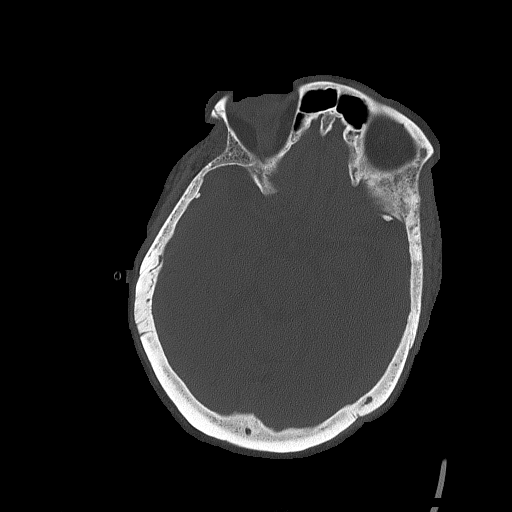

[Series 4: coronal soft tissue · coronal · 0.31mm/px · 3 of 67 slices shown]
[im 23/67  brain]
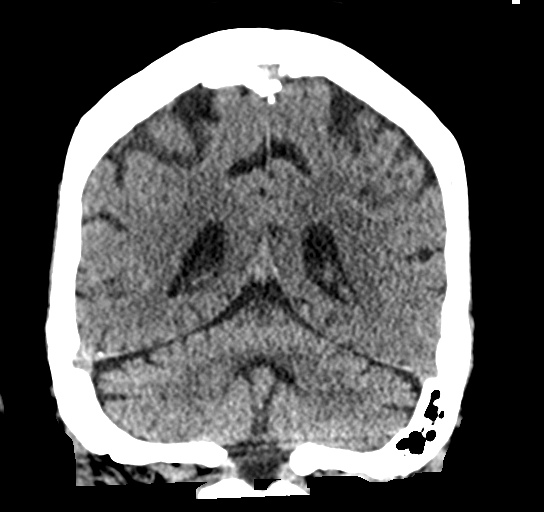
[im 30/67  brain]
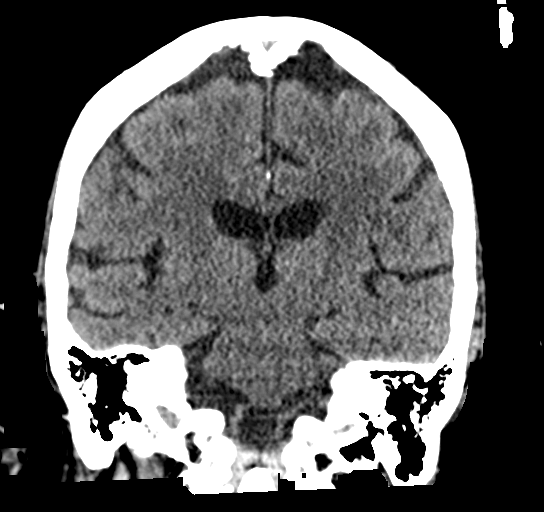
[im 37/67  brain]
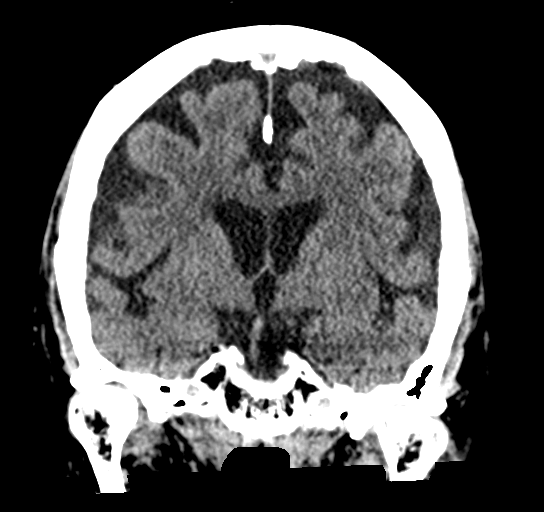

[Series 5: sagittal soft tissue · sagittal · 0.31mm/px · 3 of 58 slices shown]
[im 20/58  brain]
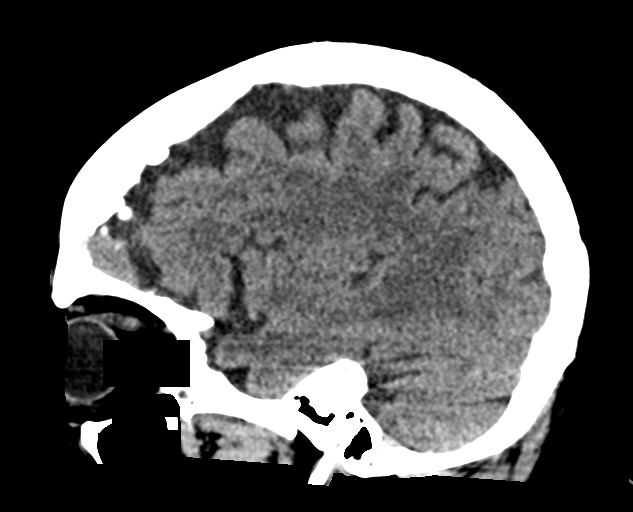
[im 29/58  brain]
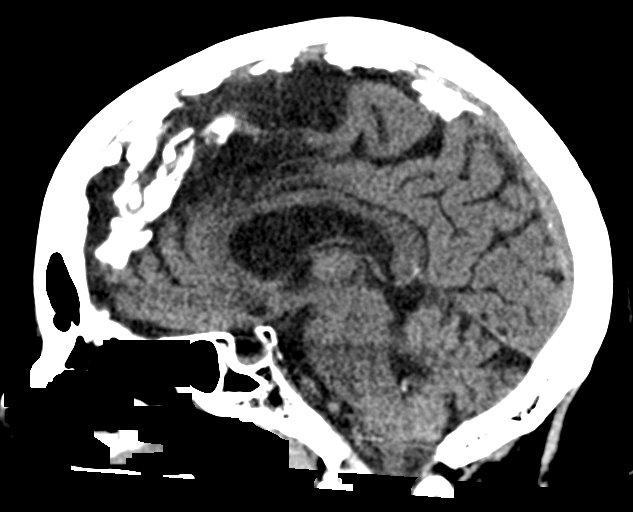
[im 39/58  brain]
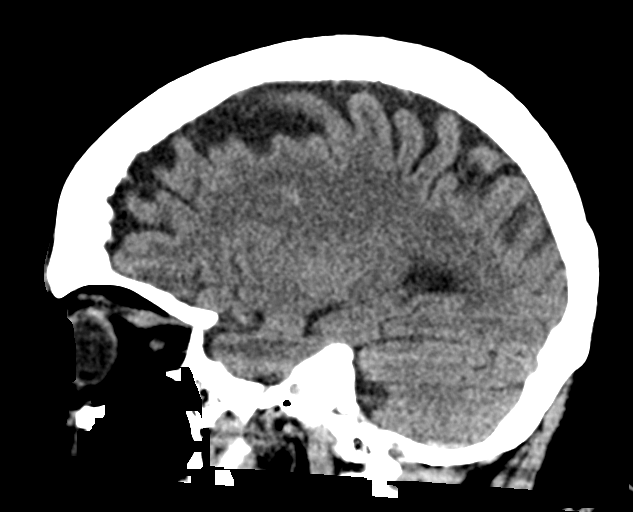

[16 of 47 positions shown; findings below may reference images not displayed]

FINDINGS: Brain: No evidence of acute large vascular territory infarction,
acute hemorrhage, hydrocephalus, extra-axial fluid collection.
Similar patchy white matter hypoattenuation, nonspecific but
compatible with chronic microvascular ischemic disease. Potentially
mildly increased size of an extra-axial 2.2 cm mass along the
inferior and anterior left frontal convexity (for example see series
5, image 39; series 2, image 14; series 4, image 13).

Vascular: No hyperdense vessel identified.

Skull: No acute fracture. There is some ill-defined lucency adjacent
to the left frontal convexity mass described above.

Sinuses/Orbits: No acute fracture.

Other: No mastoid effusions.
IMPRESSION: 1. Potentially mildly increased size of an extra-axial 2.2 cm mass
along the inferior and anterior left frontal convexity. This
probably represents a meningioma and there is no significant mass
effect, but recommend MRI with contrast to better characterize and
to confirm.
2. Otherwise, no evidence of acute intracranial abnormality.
3. Chronic microvascular ischemic disease.

## 2021-12-21 IMAGING — MR MR HEAD WO/W CM
13 of 14 series · 41 of 48 positions shown · IV contrast (gadavist)
Comparison: Same-day head CT, CT head [DATE] maxillofacial CT
[DATE]

CLINICAL DATA: Neuro deficit, stroke suspected, meningioma seen on
CT

EXAM:
MRI HEAD WITHOUT AND WITH CONTRAST
MRA HEAD WITHOUT CONTRAST
MRA NECK WITHOUT AND WITH CONTRAST
TECHNIQUE: Multiplanar, multi-echo pulse sequences of the brain and surrounding
structures were acquired without intravenous contrast. Angiographic
images of the Circle of Willis were acquired using MRA technique
without intravenous contrast. Angiographic images of the neck were
acquired using MRA technique without and with intravenous contrast.
Carotid stenosis measurements (when applicable) are obtained
utilizing NASCET criteria, using the distal internal carotid
diameter as the denominator.
CONTRAST:  7.5mL GADAVIST GADOBUTROL 1 MMOL/ML IV SOLN

[Series 5: ax dwi_tracew · axial · 3.0mm · 0.71mm/px · z∈[-57,+107]mm · 4 of 56 slices shown]
[im 1/56]
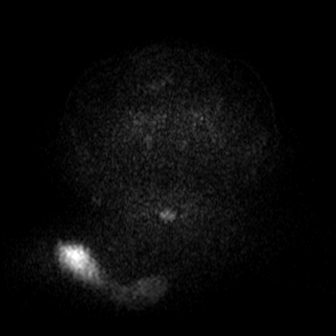
[im 19/56]
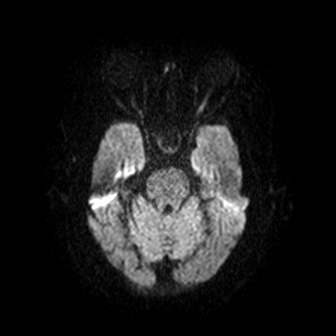
[im 37/56]
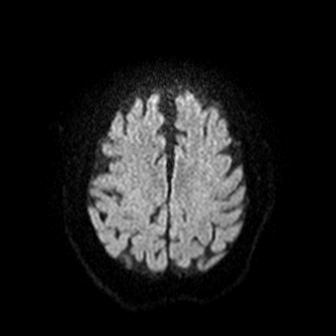
[im 56/56]
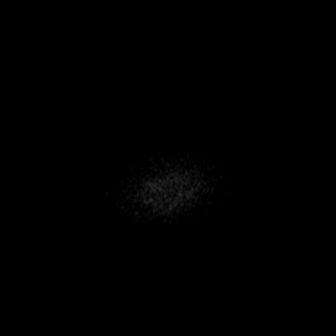

[Series 6: ax dwi_adc · axial · 3.0mm · 0.71mm/px · z∈[-57,+107]mm · 4 of 56 slices shown]
[im 1/56]
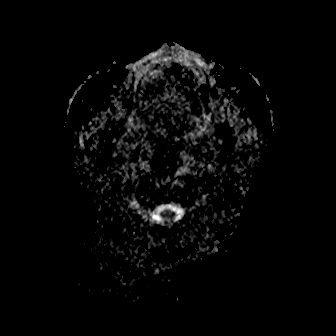
[im 19/56]
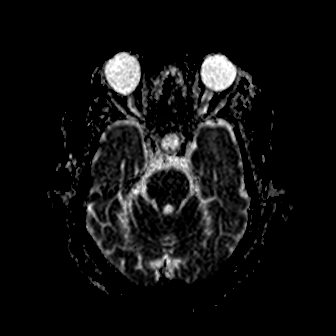
[im 37/56]
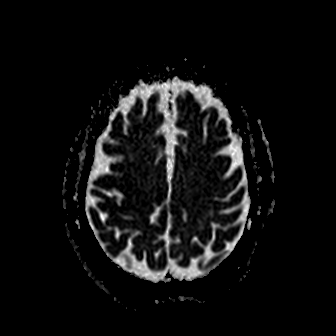
[im 56/56]
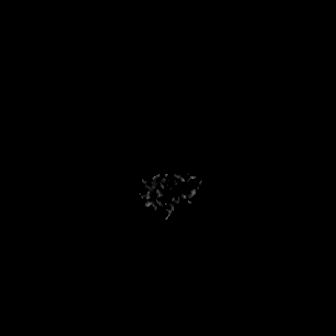

[Series 7: cor dwi_tracew · coronal · 5.0mm · 0.68mm/px · 2 of 40 slices shown]
[im 1/40]
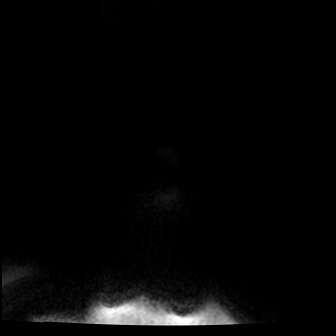
[im 40/40]
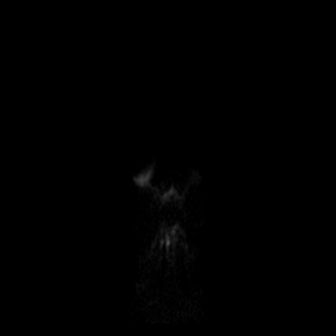

[Series 8: cor dwi_adc · coronal · 5.0mm · 0.68mm/px · 2 of 40 slices shown]
[im 1/40]
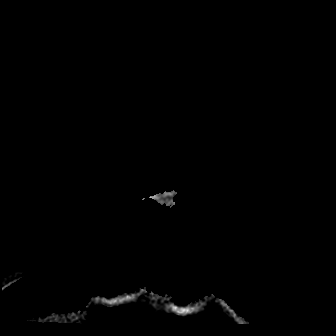
[im 40/40]
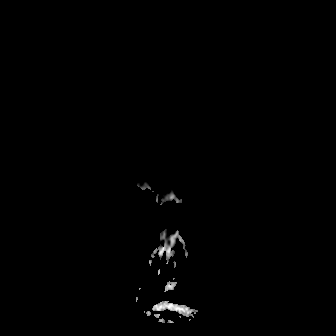

[Series 9: T1 · sagittal · 5.0mm · 0.47mm/px · 1 of 24 slices shown (1 of 2)]
[im 1/24]
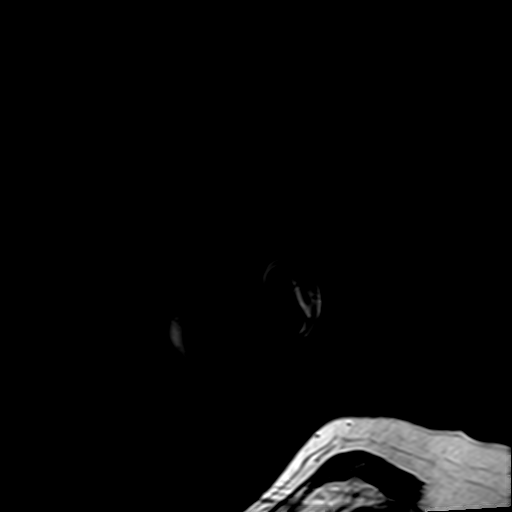

[Series 10: T2 · axial · 5.0mm · 0.86mm/px · 1 of 25 slices shown (1 of 2)]
[im 1/25]
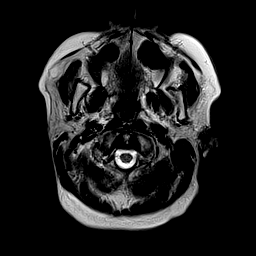

[Series 12: ax swi_pha · axial · 3.0mm · 0.90mm/px · z∈[-55,+109]mm · 3 of 56 slices shown]
[im 1/56]
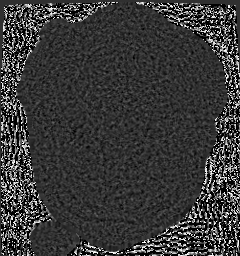
[im 28/56]
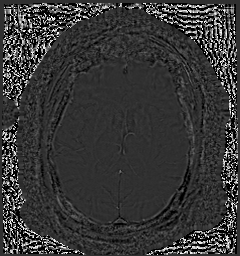
[im 56/56]
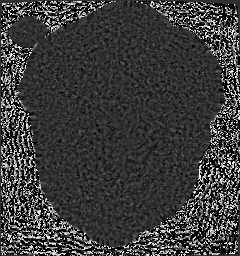

[Series 15: FLAIR · axial · 3.0mm · 0.69mm/px · z∈[-54,+107]mm · 3 of 55 slices shown]
[im 1/55]
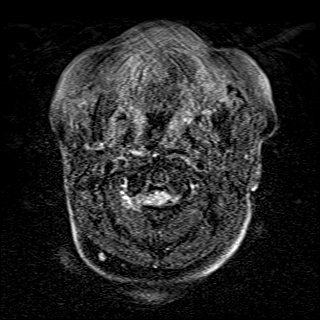
[im 28/55]
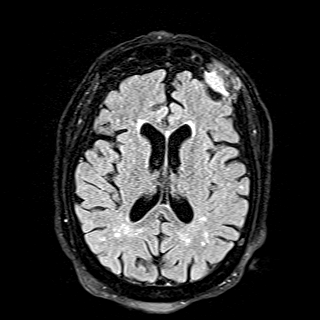
[im 55/55]
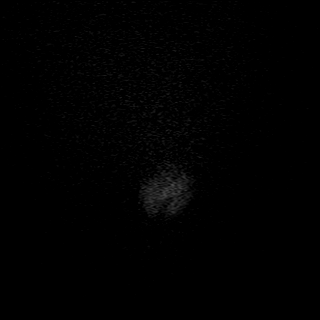

[Series 16: T1 · axial · 1.0mm · 0.98mm/px · z∈[-55,+119]mm · 8 of 176 slices shown (2 of 2)]
[im 1/176]
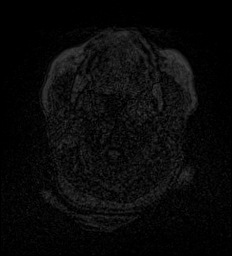
[im 20/176]
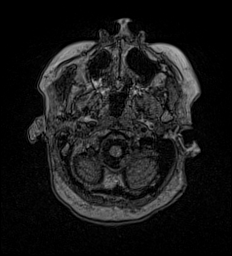
[im 59/176]
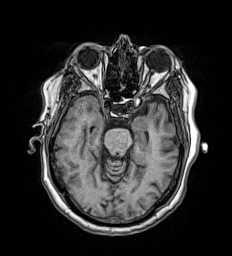
[im 78/176]
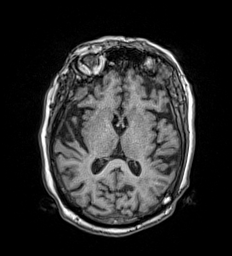
[im 98/176]
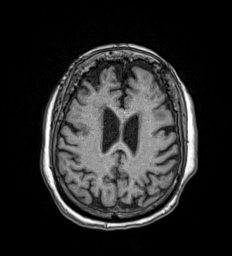
[im 117/176]
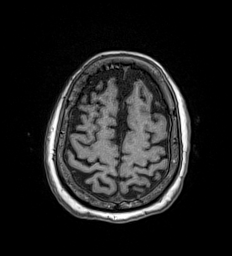
[im 156/176]
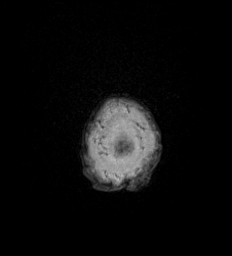
[im 176/176]
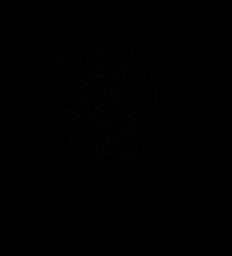

[Series 17: T2 · coronal · 5.0mm · 0.86mm/px · 2 of 30 slices shown (2 of 2)]
[im 1/30]
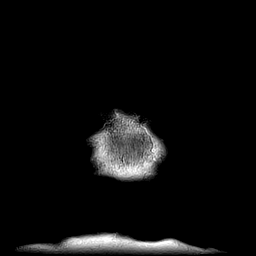
[im 30/30]
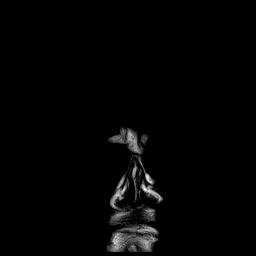

[Series 18: T1 post-contrast · axial · 1.0mm · 0.98mm/px · z∈[-55,+119]mm · 8 of 176 slices shown (1 of 3)]
[im 1/176]
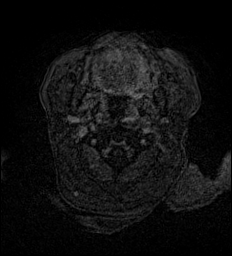
[im 20/176]
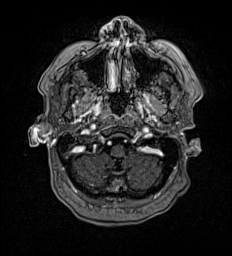
[im 59/176]
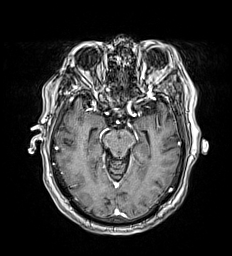
[im 78/176]
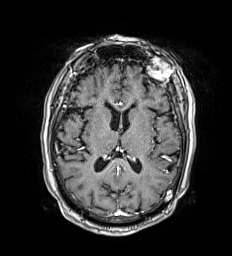
[im 98/176]
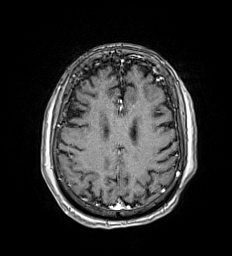
[im 117/176]
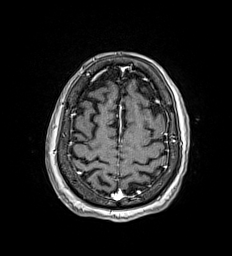
[im 156/176]
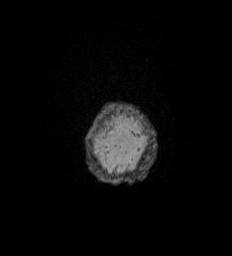
[im 176/176]
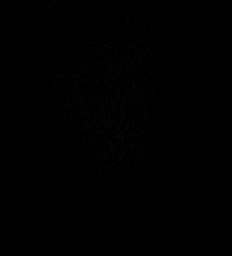

[Series 19: T1 post-contrast · coronal · 5.0mm · 0.43mm/px · 2 of 30 slices shown (2 of 3)]
[im 1/30]
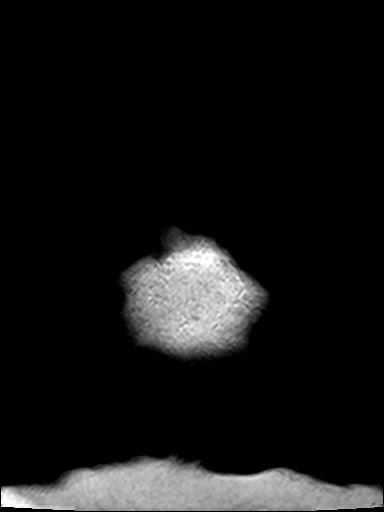
[im 30/30]
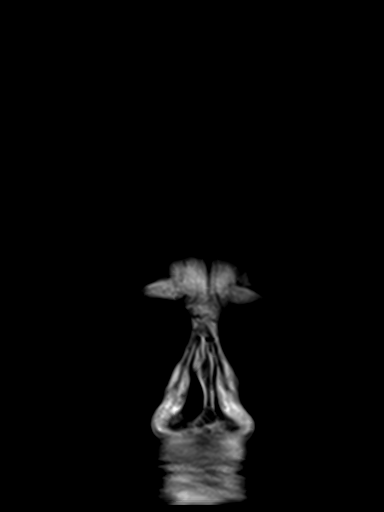

[Series 20: T1 post-contrast · sagittal · 5.0mm · 0.47mm/px · 1 of 24 slices shown (3 of 3)]
[im 1/24]
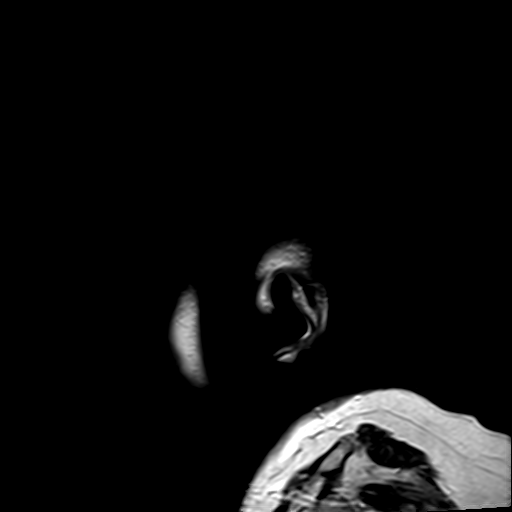

[41 of 48 positions shown; findings below may reference images not displayed]

FINDINGS: MRI HEAD FINDINGS

Brain: There is no evidence of acute intracranial hemorrhage,
extra-axial fluid collection, or acute infarct.

Background parenchymal volume is normal for age. The ventricles are
normal in size. Patchy foci of FLAIR signal abnormality in the
subcortical and periventricular white matter likely reflects sequela
of mild chronic white matter microangiopathy.

There is an extra-axial enhancing lesion overlying the left frontal
lobe with invasion of the overlying frontal calvarium. The
extraosseous component measures approximately 6 mm in thickness by
1.9 cm in width. In total including the intraosseous component, the
lesion measures approximately 2.5 cm x 1.6 cm. The lesion was likely
present on the CT head from [DATE], and appears grossly similar
in size when comparing the coronal plane (see image 4-15 on the
prior CT compared with image 19-25 on the current study). Lesion was
better seen on the CT maxillofacial from [DATE], and also
appears similar in size (see image 2-4 on that study). There is no
mass effect on the underlying brain parenchyma.

There is no other abnormal enhancement. There is no other mass
lesion. There is no midline shift.

Vascular: See below.

Skull and upper cervical spine: Intrinsically T1 hyperintense
lesions in the bilateral parietal calvarium are noted, likely benign
vascular lesions. There is Intraosseous involvement of the left
frontal calvarium by the enhancing lesion as detailed above.

Sinuses/Orbits: Paranasal sinuses are clear. Bilateral lens implants
are in place. The globes and orbits are otherwise unremarkable.

Other: None.

MRA HEAD FINDINGS

Anterior circulation: There is irregularity of the intracranial ICAs
likely reflecting atherosclerotic disease resulting in mild
narrowing of the supraclinoid segments, right worse than left

The bilateral MCAs are patent with mild atherosclerotic irregularity
of the distal branches. There is no proximal high-grade stenosis or
occlusion.

The bilateral ACAs are patent with mild atherosclerotic
irregularity. Anterior communicating artery is not definitely seen.

There is no aneurysm or AVM.

Posterior circulation: The bilateral V4 segments are patent. The
PICA origin is not well seen on the left. The basilar artery is
patent with mild multifocal irregularity but no high-grade stenosis
or occlusion.

The bilateral PCAs are patent with mild atherosclerotic irregularity
distally. There is no proximal high-grade stenosis or occlusion. The
left posterior communicating artery is identified. The right
posterior communicating artery is not definitely seen

There is no aneurysm or AVM.

Anatomic variants: None.

MRA NECK FINDINGS

Aortic arch: Aortic arch is normal. The origins of the major branch
vessels are patent. The subclavian arteries are patent to the level
imaged.

Right carotid system: The right common, internal, and external
carotid arteries are patent, without evidence of hemodynamically
significant stenosis or occlusion. There is no dissection or
aneurysm. There is a medialized course of the common carotid artery.

Left carotid system: The left common, internal, and external carotid
arteries are patent, without evidence of hemodynamically significant
stenosis or occlusion. There is no dissection or aneurysm. There is
a medialized course of the common carotid artery.

Vertebral arteries: The proximal right vertebral artery is somewhat
tortuous. The proximal left vertebral artery is poorly opacified and
may be stenotic. The vertebral arteries are otherwise patent with
antegrade flow, without evidence of hemodynamically significant
stenosis or occlusion. There is no evidence of dissection or
aneurysm.

Other: None
IMPRESSION: 1. No acute intracranial hemorrhage.  No evidence of acute infarct.
2. Extra-axial enhancing lesion over the left frontal lobe is most
consistent with a meningioma with invasion of the overlying left
frontal calvarium. The lesion does not appear significantly changed
compared to the maxillofacial CT from [DATE]. There is no mass
effect on the underlying brain parenchyma or parenchymal edema.
3. Patent intracranial vasculature with mild atherosclerotic
irregularity but no proximal high-grade stenosis or occlusion.
4. Poor opacification of the proximal left vertebral artery which
may be stenotic. CTA could be considered for better evaluation as
indicated. The distal left vertebral artery appears widely patent.
5. Otherwise, patent vasculature of the neck with no evidence of
hemodynamically significant stenosis, occlusion, or dissection.

## 2021-12-21 IMAGING — DX DG CHEST 1V PORT
1 series · 1 of 1 positions shown · non-contrast
Comparison: [DATE]

CLINICAL DATA: Chest pain

EXAM:
PORTABLE CHEST 1 VIEW

[chest ap]
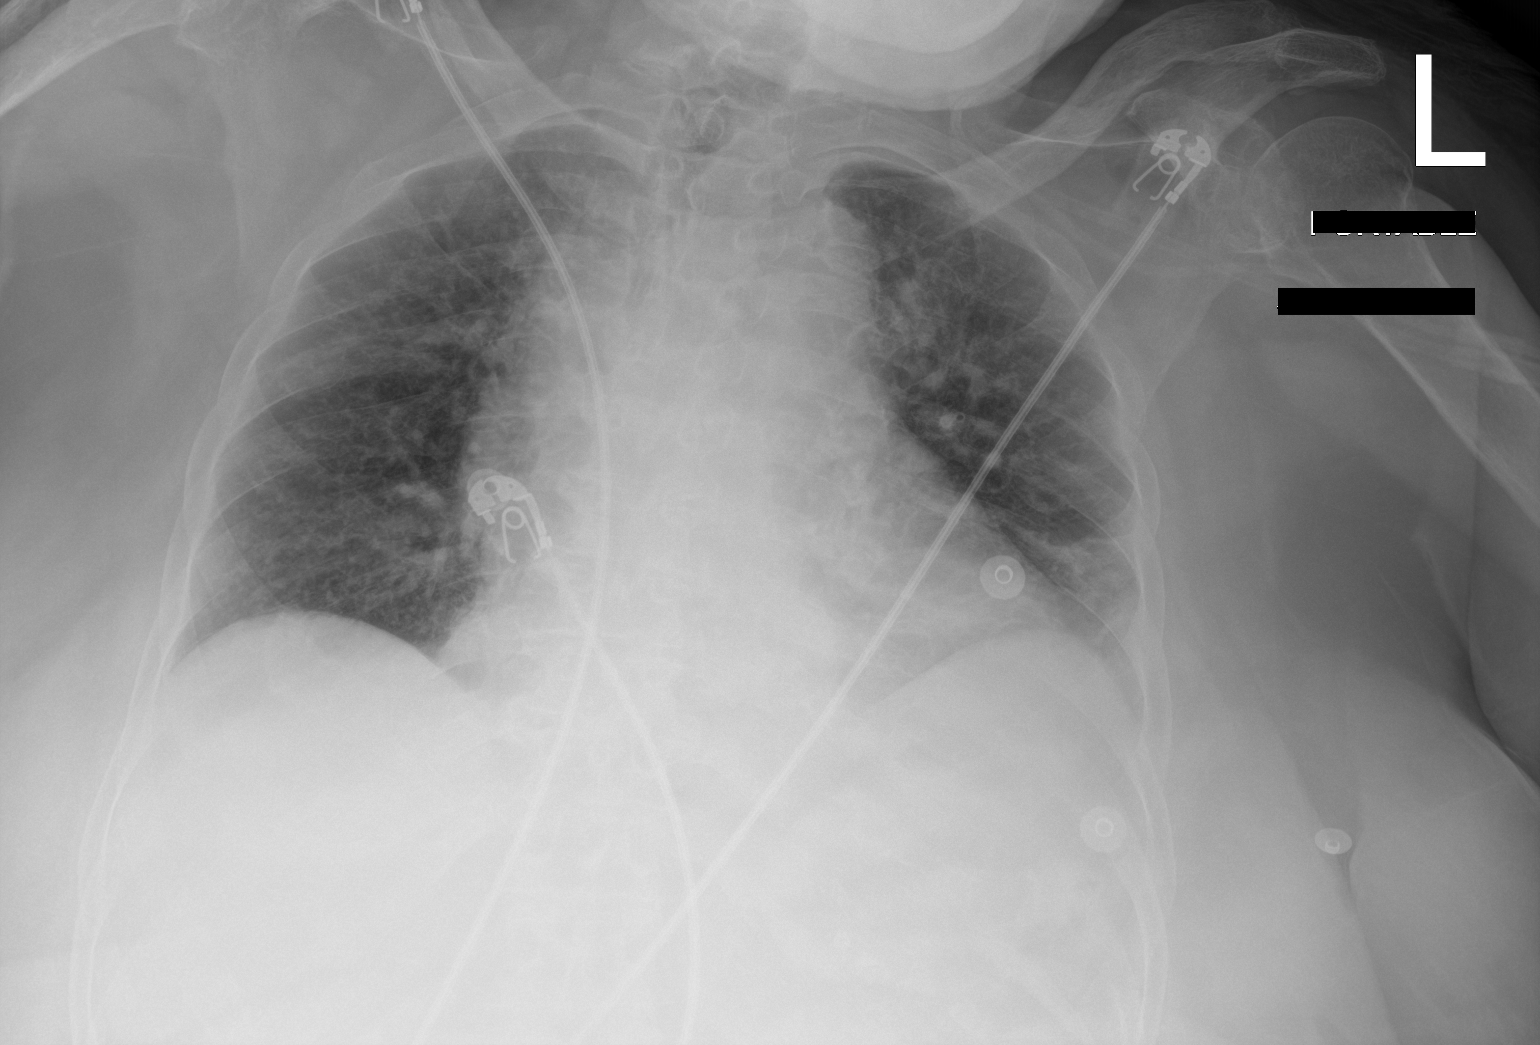

[1 of 1 positions shown; findings below may reference images not displayed]

FINDINGS: Transverse diameter of heart is increased. There is poor
inspiration. There are no signs of alveolar pulmonary edema. Linear
densities seen in the left lower lung fields. Left lateral CP angle
is indistinct. There is no pneumothorax.
IMPRESSION: Cardiomegaly. Linear densities in the left lower lung fields may
suggest subsegmental atelectasis. Left lateral CP angle is
indistinct, possibly suggesting minimal effusion.

## 2021-12-21 IMAGING — MR MR MRA HEAD W/O CM
1 series · 22 of 48 positions shown · IV contrast (gadavist)
Comparison: Same-day head CT, CT head [DATE] maxillofacial CT
[DATE]

CLINICAL DATA: Neuro deficit, stroke suspected, meningioma seen on
CT

EXAM:
MRI HEAD WITHOUT AND WITH CONTRAST
MRA HEAD WITHOUT CONTRAST
MRA NECK WITHOUT AND WITH CONTRAST
TECHNIQUE: Multiplanar, multi-echo pulse sequences of the brain and surrounding
structures were acquired without intravenous contrast. Angiographic
images of the Circle of Willis were acquired using MRA technique
without intravenous contrast. Angiographic images of the neck were
acquired using MRA technique without and with intravenous contrast.
Carotid stenosis measurements (when applicable) are obtained
utilizing NASCET criteria, using the distal internal carotid
diameter as the denominator.
CONTRAST:  7.5mL GADAVIST GADOBUTROL 1 MMOL/ML IV SOLN

[Series 1: TOF · axial · 0.5mm · 0.48mm/px · z∈[-52,+44]mm · 22 of 217 slices shown]
[im 1/217]
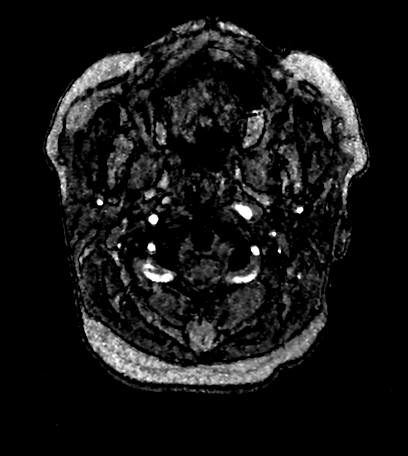
[im 5/217]
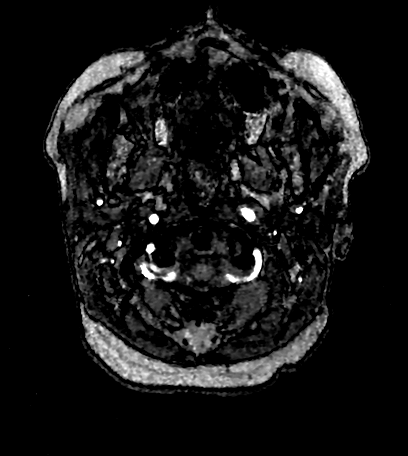
[im 10/217]
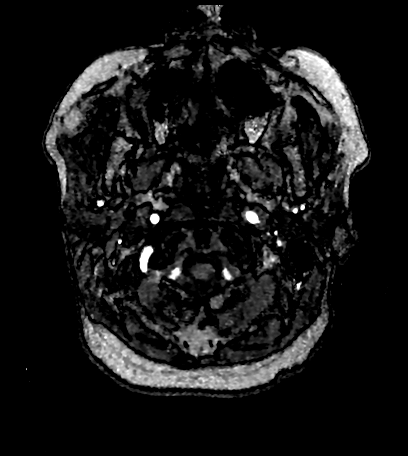
[im 14/217]
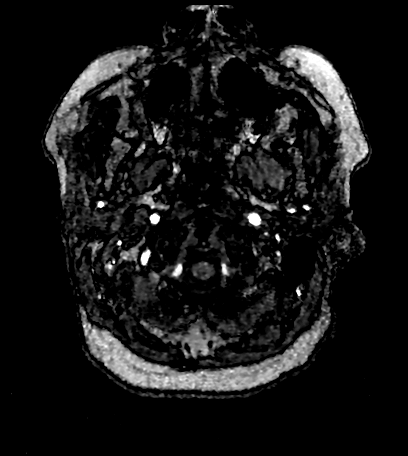
[im 19/217]
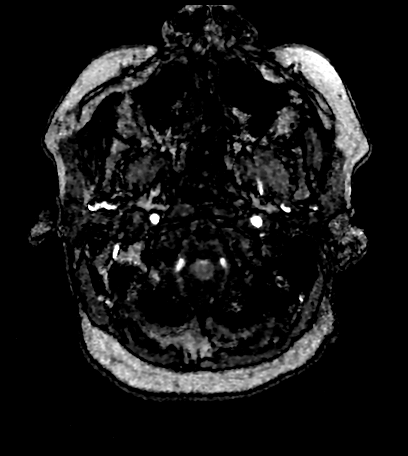
[im 23/217]
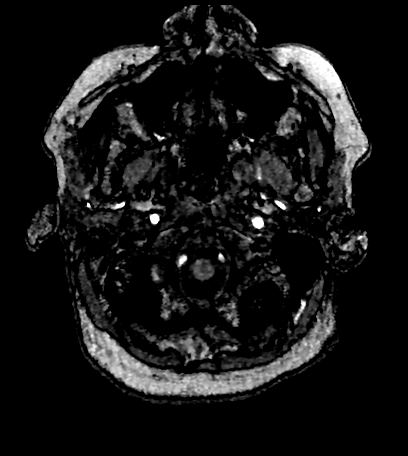
[im 28/217]
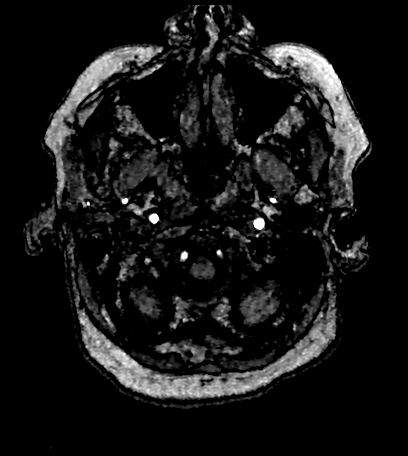
[im 33/217]
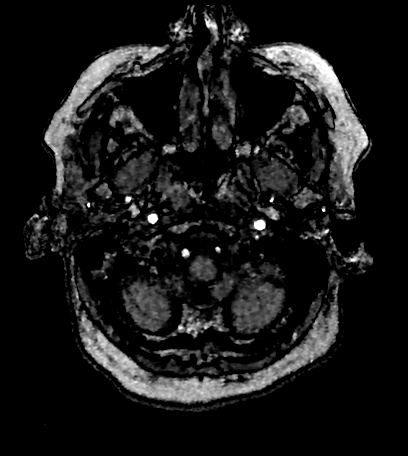
[im 37/217]
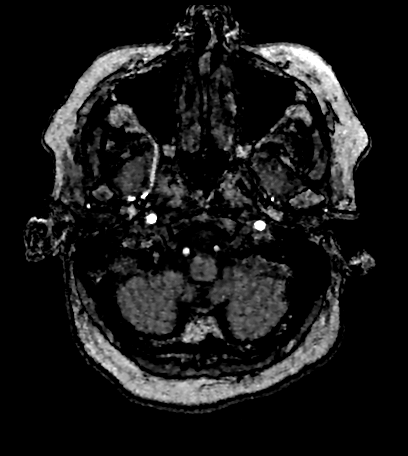
[im 42/217]
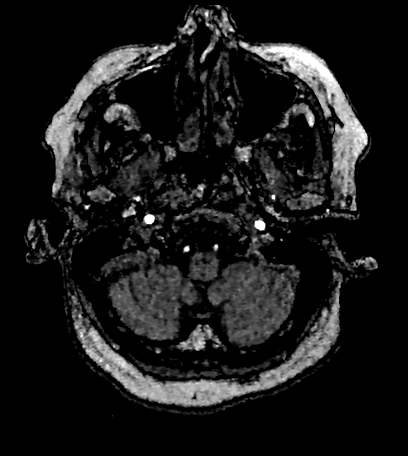
[im 46/217]
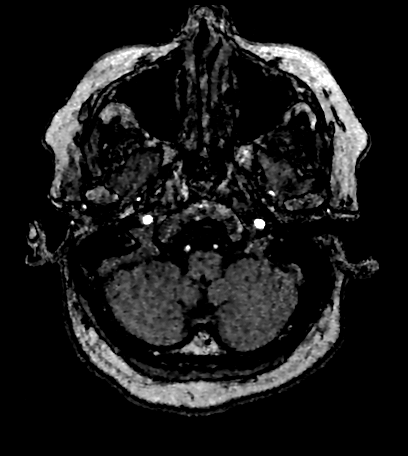
[im 51/217]
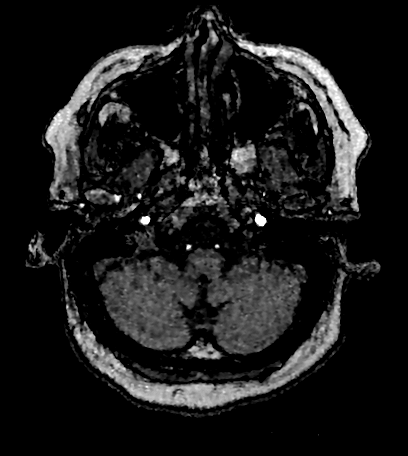
[im 56/217]
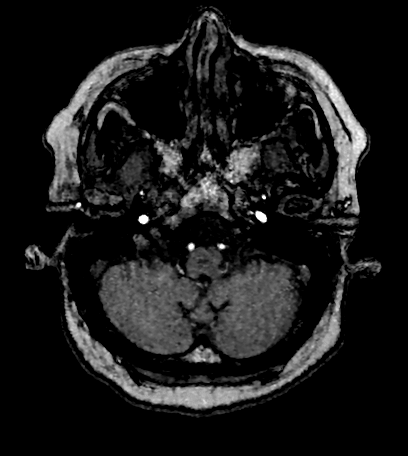
[im 60/217]
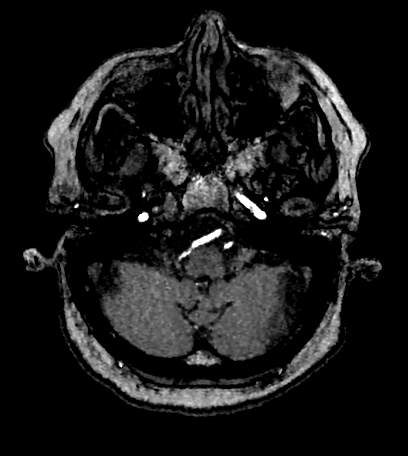
[im 69/217]
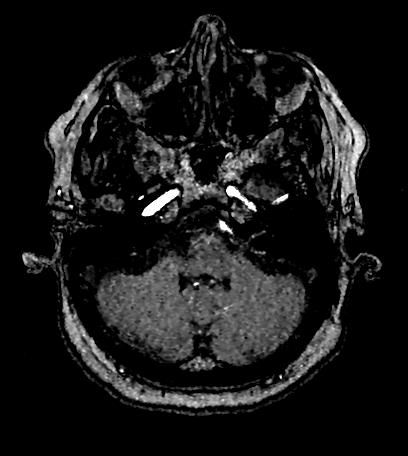
[im 97/217]
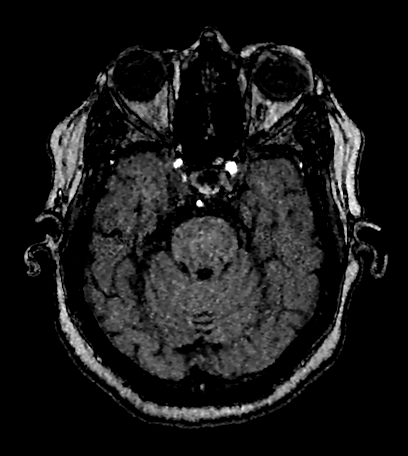
[im 111/217]
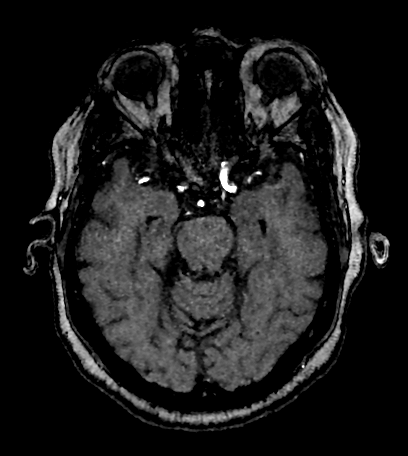
[im 125/217]
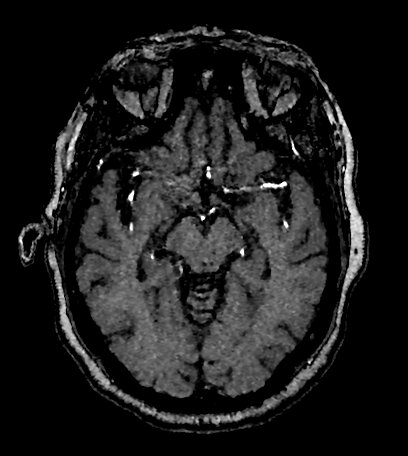
[im 152/217]
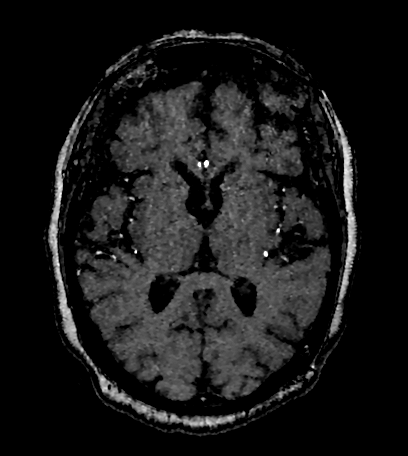
[im 180/217]
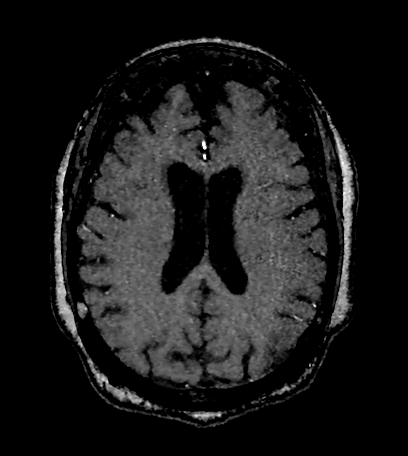
[im 184/217]
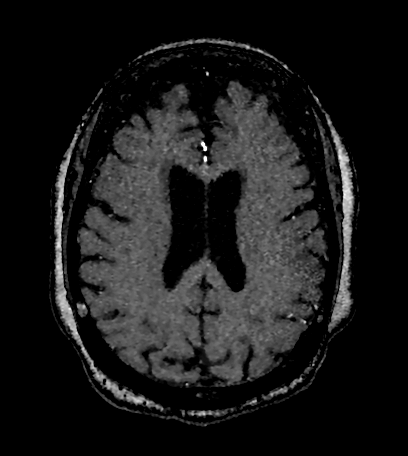
[im 207/217]
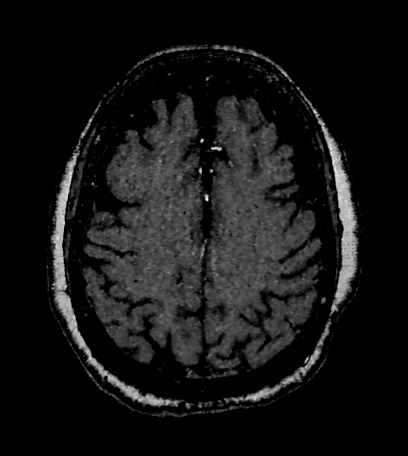

[22 of 48 positions shown; findings below may reference images not displayed]

FINDINGS: MRI HEAD FINDINGS

Brain: There is no evidence of acute intracranial hemorrhage,
extra-axial fluid collection, or acute infarct.

Background parenchymal volume is normal for age. The ventricles are
normal in size. Patchy foci of FLAIR signal abnormality in the
subcortical and periventricular white matter likely reflects sequela
of mild chronic white matter microangiopathy.

There is an extra-axial enhancing lesion overlying the left frontal
lobe with invasion of the overlying frontal calvarium. The
extraosseous component measures approximately 6 mm in thickness by
1.9 cm in width. In total including the intraosseous component, the
lesion measures approximately 2.5 cm x 1.6 cm. The lesion was likely
present on the CT head from [DATE], and appears grossly similar
in size when comparing the coronal plane (see image 4-15 on the
prior CT compared with image 19-25 on the current study). Lesion was
better seen on the CT maxillofacial from [DATE], and also
appears similar in size (see image 2-4 on that study). There is no
mass effect on the underlying brain parenchyma.

There is no other abnormal enhancement. There is no other mass
lesion. There is no midline shift.

Vascular: See below.

Skull and upper cervical spine: Intrinsically T1 hyperintense
lesions in the bilateral parietal calvarium are noted, likely benign
vascular lesions. There is Intraosseous involvement of the left
frontal calvarium by the enhancing lesion as detailed above.

Sinuses/Orbits: Paranasal sinuses are clear. Bilateral lens implants
are in place. The globes and orbits are otherwise unremarkable.

Other: None.

MRA HEAD FINDINGS

Anterior circulation: There is irregularity of the intracranial ICAs
likely reflecting atherosclerotic disease resulting in mild
narrowing of the supraclinoid segments, right worse than left

The bilateral MCAs are patent with mild atherosclerotic irregularity
of the distal branches. There is no proximal high-grade stenosis or
occlusion.

The bilateral ACAs are patent with mild atherosclerotic
irregularity. Anterior communicating artery is not definitely seen.

There is no aneurysm or AVM.

Posterior circulation: The bilateral V4 segments are patent. The
PICA origin is not well seen on the left. The basilar artery is
patent with mild multifocal irregularity but no high-grade stenosis
or occlusion.

The bilateral PCAs are patent with mild atherosclerotic irregularity
distally. There is no proximal high-grade stenosis or occlusion. The
left posterior communicating artery is identified. The right
posterior communicating artery is not definitely seen

There is no aneurysm or AVM.

Anatomic variants: None.

MRA NECK FINDINGS

Aortic arch: Aortic arch is normal. The origins of the major branch
vessels are patent. The subclavian arteries are patent to the level
imaged.

Right carotid system: The right common, internal, and external
carotid arteries are patent, without evidence of hemodynamically
significant stenosis or occlusion. There is no dissection or
aneurysm. There is a medialized course of the common carotid artery.

Left carotid system: The left common, internal, and external carotid
arteries are patent, without evidence of hemodynamically significant
stenosis or occlusion. There is no dissection or aneurysm. There is
a medialized course of the common carotid artery.

Vertebral arteries: The proximal right vertebral artery is somewhat
tortuous. The proximal left vertebral artery is poorly opacified and
may be stenotic. The vertebral arteries are otherwise patent with
antegrade flow, without evidence of hemodynamically significant
stenosis or occlusion. There is no evidence of dissection or
aneurysm.

Other: None
IMPRESSION: 1. No acute intracranial hemorrhage.  No evidence of acute infarct.
2. Extra-axial enhancing lesion over the left frontal lobe is most
consistent with a meningioma with invasion of the overlying left
frontal calvarium. The lesion does not appear significantly changed
compared to the maxillofacial CT from [DATE]. There is no mass
effect on the underlying brain parenchyma or parenchymal edema.
3. Patent intracranial vasculature with mild atherosclerotic
irregularity but no proximal high-grade stenosis or occlusion.
4. Poor opacification of the proximal left vertebral artery which
may be stenotic. CTA could be considered for better evaluation as
indicated. The distal left vertebral artery appears widely patent.
5. Otherwise, patent vasculature of the neck with no evidence of
hemodynamically significant stenosis, occlusion, or dissection.

## 2021-12-21 IMAGING — MR MR MRA NECK WO/W CM
3 of 4 series · 33 of 48 positions shown · IV contrast (gadavist)
Comparison: Same-day head CT, CT head [DATE] maxillofacial CT
[DATE]

CLINICAL DATA: Neuro deficit, stroke suspected, meningioma seen on
CT

EXAM:
MRI HEAD WITHOUT AND WITH CONTRAST
MRA HEAD WITHOUT CONTRAST
MRA NECK WITHOUT AND WITH CONTRAST
TECHNIQUE: Multiplanar, multi-echo pulse sequences of the brain and surrounding
structures were acquired without intravenous contrast. Angiographic
images of the Circle of Willis were acquired using MRA technique
without intravenous contrast. Angiographic images of the neck were
acquired using MRA technique without and with intravenous contrast.
Carotid stenosis measurements (when applicable) are obtained
utilizing NASCET criteria, using the distal internal carotid
diameter as the denominator.
CONTRAST:  7.5mL GADAVIST GADOBUTROL 1 MMOL/ML IV SOLN

[Series 9: angio_fl3d_cor_pre_ttc=2.0s · coronal · 0.9mm · 0.85mm/px · 11 of 96 slices shown]
[im 1/96]
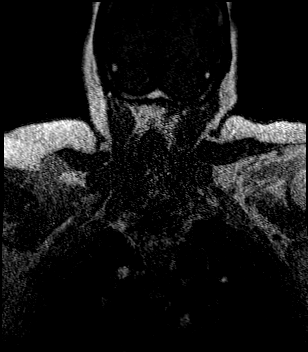
[im 10/96]
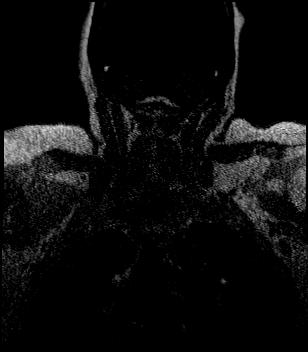
[im 20/96]
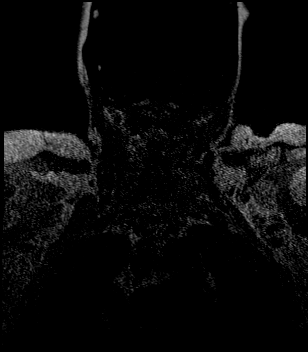
[im 29/96]
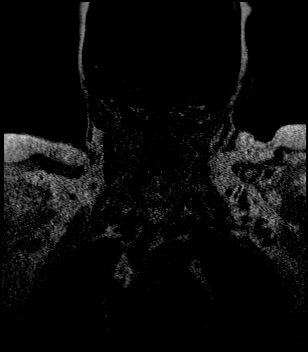
[im 39/96]
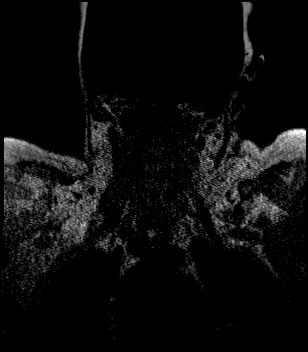
[im 48/96]
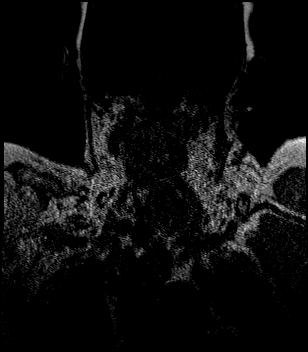
[im 58/96]
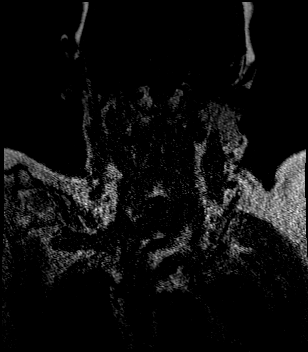
[im 67/96]
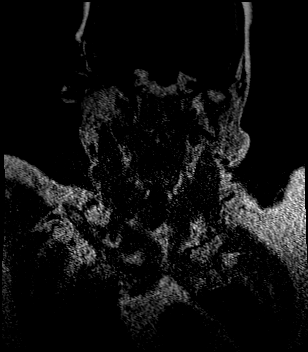
[im 77/96]
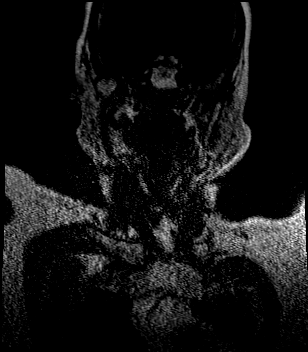
[im 86/96]
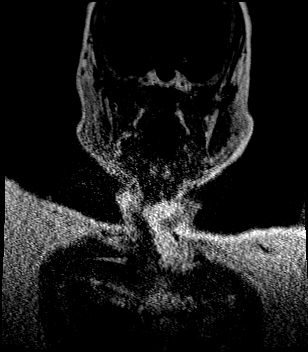
[im 96/96]
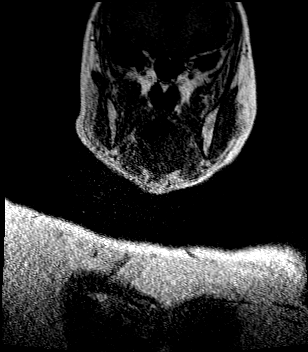

[Series 12: angio_fl3d_cor_post_ttc=2.0s_moco-adv · coronal · 0.9mm · 0.85mm/px · 11 of 96 slices shown]
[im 1/96]
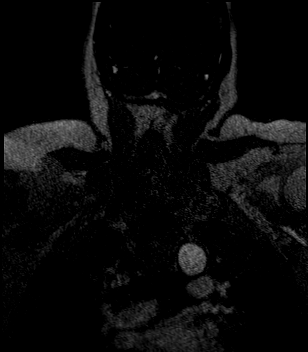
[im 10/96]
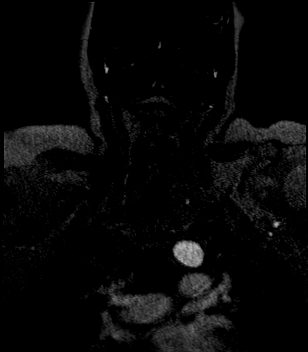
[im 20/96]
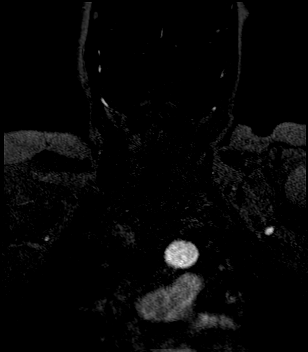
[im 29/96]
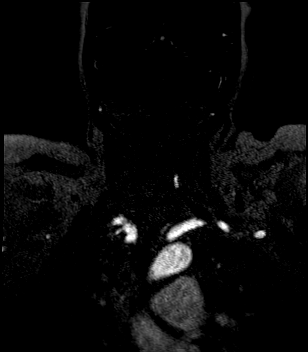
[im 39/96]
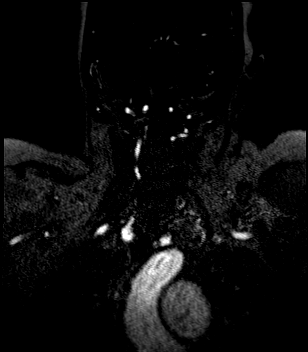
[im 48/96]
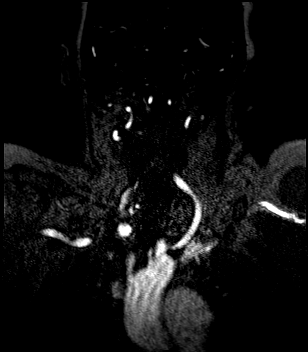
[im 58/96]
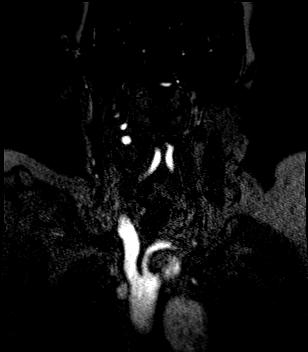
[im 67/96]
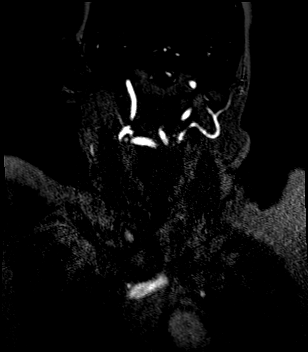
[im 77/96]
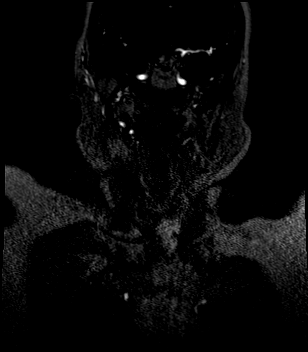
[im 86/96]
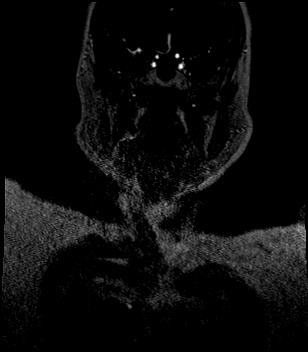
[im 96/96]
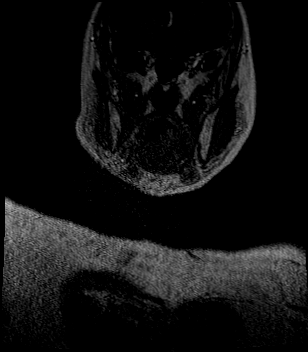

[Series 13: angio_fl3d_cor_post_ttc=2.0s_moco-adv_sub · coronal · 0.9mm · 0.85mm/px · 11 of 93 slices shown]
[im 1/93]
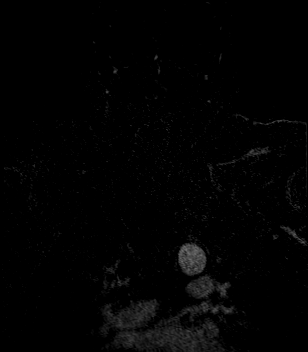
[im 10/93]
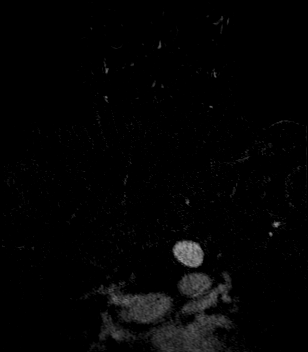
[im 19/93]
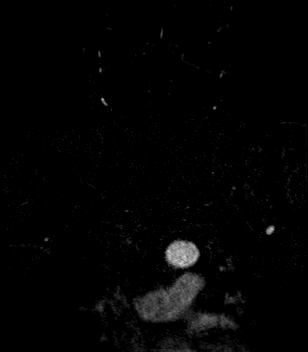
[im 28/93]
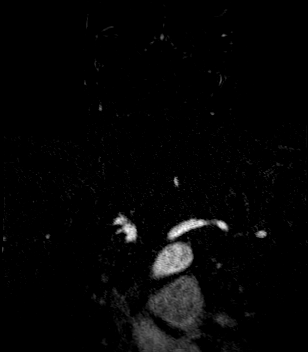
[im 37/93]
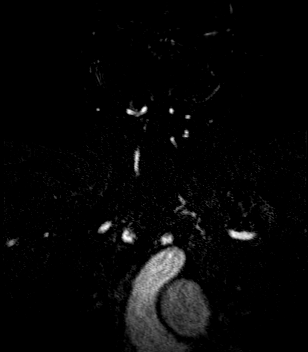
[im 47/93]
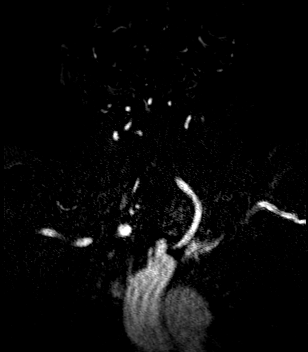
[im 56/93]
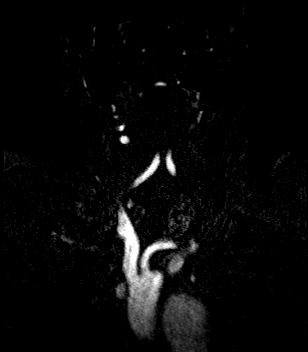
[im 65/93]
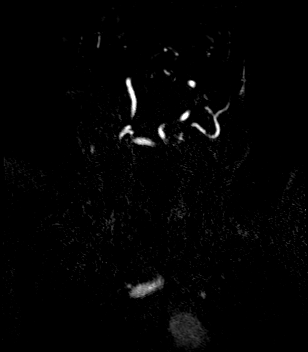
[im 74/93]
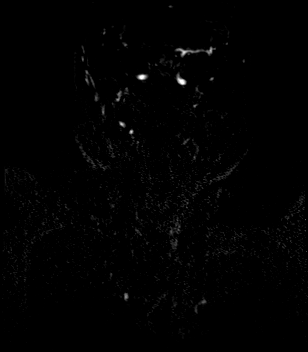
[im 83/93]
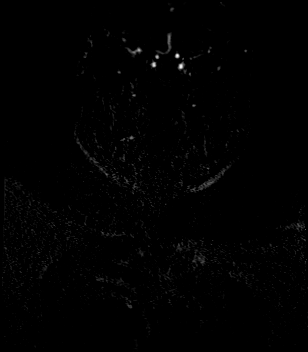
[im 93/93]
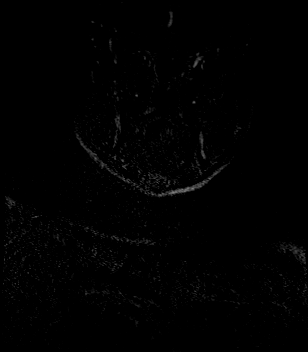

[33 of 48 positions shown; findings below may reference images not displayed]

FINDINGS: MRI HEAD FINDINGS

Brain: There is no evidence of acute intracranial hemorrhage,
extra-axial fluid collection, or acute infarct.

Background parenchymal volume is normal for age. The ventricles are
normal in size. Patchy foci of FLAIR signal abnormality in the
subcortical and periventricular white matter likely reflects sequela
of mild chronic white matter microangiopathy.

There is an extra-axial enhancing lesion overlying the left frontal
lobe with invasion of the overlying frontal calvarium. The
extraosseous component measures approximately 6 mm in thickness by
1.9 cm in width. In total including the intraosseous component, the
lesion measures approximately 2.5 cm x 1.6 cm. The lesion was likely
present on the CT head from [DATE], and appears grossly similar
in size when comparing the coronal plane (see image 4-15 on the
prior CT compared with image 19-25 on the current study). Lesion was
better seen on the CT maxillofacial from [DATE], and also
appears similar in size (see image 2-4 on that study). There is no
mass effect on the underlying brain parenchyma.

There is no other abnormal enhancement. There is no other mass
lesion. There is no midline shift.

Vascular: See below.

Skull and upper cervical spine: Intrinsically T1 hyperintense
lesions in the bilateral parietal calvarium are noted, likely benign
vascular lesions. There is Intraosseous involvement of the left
frontal calvarium by the enhancing lesion as detailed above.

Sinuses/Orbits: Paranasal sinuses are clear. Bilateral lens implants
are in place. The globes and orbits are otherwise unremarkable.

Other: None.

MRA HEAD FINDINGS

Anterior circulation: There is irregularity of the intracranial ICAs
likely reflecting atherosclerotic disease resulting in mild
narrowing of the supraclinoid segments, right worse than left

The bilateral MCAs are patent with mild atherosclerotic irregularity
of the distal branches. There is no proximal high-grade stenosis or
occlusion.

The bilateral ACAs are patent with mild atherosclerotic
irregularity. Anterior communicating artery is not definitely seen.

There is no aneurysm or AVM.

Posterior circulation: The bilateral V4 segments are patent. The
PICA origin is not well seen on the left. The basilar artery is
patent with mild multifocal irregularity but no high-grade stenosis
or occlusion.

The bilateral PCAs are patent with mild atherosclerotic irregularity
distally. There is no proximal high-grade stenosis or occlusion. The
left posterior communicating artery is identified. The right
posterior communicating artery is not definitely seen

There is no aneurysm or AVM.

Anatomic variants: None.

MRA NECK FINDINGS

Aortic arch: Aortic arch is normal. The origins of the major branch
vessels are patent. The subclavian arteries are patent to the level
imaged.

Right carotid system: The right common, internal, and external
carotid arteries are patent, without evidence of hemodynamically
significant stenosis or occlusion. There is no dissection or
aneurysm. There is a medialized course of the common carotid artery.

Left carotid system: The left common, internal, and external carotid
arteries are patent, without evidence of hemodynamically significant
stenosis or occlusion. There is no dissection or aneurysm. There is
a medialized course of the common carotid artery.

Vertebral arteries: The proximal right vertebral artery is somewhat
tortuous. The proximal left vertebral artery is poorly opacified and
may be stenotic. The vertebral arteries are otherwise patent with
antegrade flow, without evidence of hemodynamically significant
stenosis or occlusion. There is no evidence of dissection or
aneurysm.

Other: None
IMPRESSION: 1. No acute intracranial hemorrhage.  No evidence of acute infarct.
2. Extra-axial enhancing lesion over the left frontal lobe is most
consistent with a meningioma with invasion of the overlying left
frontal calvarium. The lesion does not appear significantly changed
compared to the maxillofacial CT from [DATE]. There is no mass
effect on the underlying brain parenchyma or parenchymal edema.
3. Patent intracranial vasculature with mild atherosclerotic
irregularity but no proximal high-grade stenosis or occlusion.
4. Poor opacification of the proximal left vertebral artery which
may be stenotic. CTA could be considered for better evaluation as
indicated. The distal left vertebral artery appears widely patent.
5. Otherwise, patent vasculature of the neck with no evidence of
hemodynamically significant stenosis, occlusion, or dissection.

## 2021-12-21 MED ORDER — ADULT MULTIVITAMIN W/MINERALS CH
1.0000 | ORAL_TABLET | Freq: Every day | ORAL | Status: DC
  Administered 2021-12-22: 1 via ORAL
  Filled 2021-12-21: qty 1

## 2021-12-21 MED ORDER — TRAMADOL HCL 50 MG PO TABS
50.0000 mg | ORAL_TABLET | Freq: Three times a day (TID) | ORAL | Status: DC
  Administered 2021-12-21 – 2021-12-22 (×3): 50 mg via ORAL
  Filled 2021-12-21 (×3): qty 1

## 2021-12-21 MED ORDER — SODIUM CHLORIDE 0.9 % IV SOLN: 1.0000 g | INTRAVENOUS | Status: DC

## 2021-12-21 MED ORDER — SODIUM CHLORIDE 0.9% FLUSH: 3.0000 mL | INTRAVENOUS | Status: DC | PRN

## 2021-12-21 MED ORDER — SODIUM CHLORIDE 0.9 % IV SOLN
1.0000 g | INTRAVENOUS | Status: DC
  Filled 2021-12-21: qty 10

## 2021-12-21 MED ORDER — LEVOTHYROXINE SODIUM 50 MCG PO TABS
75.0000 ug | ORAL_TABLET | Freq: Every day | ORAL | Status: DC
  Administered 2021-12-22: 75 ug via ORAL
  Filled 2021-12-21: qty 1

## 2021-12-21 MED ORDER — STROKE: EARLY STAGES OF RECOVERY BOOK: Freq: Once | Status: AC

## 2021-12-21 MED ORDER — PANTOPRAZOLE SODIUM 40 MG PO TBEC
40.0000 mg | DELAYED_RELEASE_TABLET | Freq: Every day | ORAL | Status: DC
Start: 2021-12-22 — End: 2021-12-22
  Administered 2021-12-22: 40 mg via ORAL
  Filled 2021-12-21: qty 1

## 2021-12-21 MED ORDER — AMITRIPTYLINE HCL 25 MG PO TABS
35.0000 mg | ORAL_TABLET | Freq: Every day | ORAL | Status: DC
  Administered 2021-12-21: 35 mg via ORAL
  Filled 2021-12-21 (×2): qty 1

## 2021-12-21 MED ORDER — ACETAMINOPHEN 325 MG PO TABS: 650.0000 mg | ORAL_TABLET | Freq: Four times a day (QID) | ORAL | Status: DC | PRN

## 2021-12-21 MED ORDER — ONDANSETRON HCL 4 MG PO TABS: 4.0000 mg | ORAL_TABLET | Freq: Four times a day (QID) | ORAL | Status: DC | PRN

## 2021-12-21 MED ORDER — MEMANTINE HCL 5 MG PO TABS
5.0000 mg | ORAL_TABLET | Freq: Two times a day (BID) | ORAL | Status: DC
  Administered 2021-12-21 – 2021-12-22 (×2): 5 mg via ORAL
  Filled 2021-12-21 (×2): qty 1

## 2021-12-21 MED ORDER — SENNOSIDES-DOCUSATE SODIUM 8.6-50 MG PO TABS
1.0000 | ORAL_TABLET | Freq: Two times a day (BID) | ORAL | Status: DC
  Administered 2021-12-21 – 2021-12-22 (×2): 1 via ORAL
  Filled 2021-12-21 (×2): qty 1

## 2021-12-21 MED ORDER — ENOXAPARIN SODIUM 40 MG/0.4ML IJ SOSY: 40.0000 mg | PREFILLED_SYRINGE | INTRAMUSCULAR | Status: DC

## 2021-12-21 MED ORDER — SODIUM CHLORIDE 0.9 % IV SOLN
1.0000 g | Freq: Once | INTRAVENOUS | Status: AC
  Administered 2021-12-21: 1 g via INTRAVENOUS

## 2021-12-21 MED ORDER — AMITRIPTYLINE HCL 50 MG PO TABS: 25.0000 mg | ORAL_TABLET | Freq: Every day | ORAL | Status: DC

## 2021-12-21 MED ORDER — DIVALPROEX SODIUM 125 MG PO CSDR
250.0000 mg | DELAYED_RELEASE_CAPSULE | Freq: Two times a day (BID) | ORAL | Status: DC
  Administered 2021-12-21 – 2021-12-22 (×2): 250 mg via ORAL
  Filled 2021-12-21 (×3): qty 2

## 2021-12-21 MED ORDER — ASPIRIN 81 MG PO CHEW
81.0000 mg | CHEWABLE_TABLET | Freq: Every day | ORAL | Status: DC
  Administered 2021-12-21 – 2021-12-22 (×2): 81 mg via ORAL
  Filled 2021-12-21: qty 1

## 2021-12-21 MED ORDER — SODIUM CHLORIDE 0.9% FLUSH
3.0000 mL | Freq: Two times a day (BID) | INTRAVENOUS | Status: DC
  Administered 2021-12-21 – 2021-12-22 (×2): 3 mL via INTRAVENOUS

## 2021-12-21 MED ORDER — ACETAMINOPHEN 500 MG PO TABS
1000.0000 mg | ORAL_TABLET | Freq: Three times a day (TID) | ORAL | Status: DC
  Administered 2021-12-21 – 2021-12-22 (×3): 1000 mg via ORAL
  Filled 2021-12-21 (×3): qty 2

## 2021-12-21 MED ORDER — ONDANSETRON HCL 4 MG/2ML IJ SOLN: 4.0000 mg | Freq: Four times a day (QID) | INTRAMUSCULAR | Status: DC | PRN

## 2021-12-21 MED ORDER — PREGABALIN 50 MG PO CAPS
200.0000 mg | ORAL_CAPSULE | Freq: Two times a day (BID) | ORAL | Status: DC
  Administered 2021-12-21 – 2021-12-22 (×2): 200 mg via ORAL
  Filled 2021-12-21 (×2): qty 1

## 2021-12-21 MED ORDER — ACETAMINOPHEN 650 MG RE SUPP
650.0000 mg | Freq: Four times a day (QID) | RECTAL | Status: DC | PRN
Start: 2021-12-21 — End: 2021-12-22

## 2021-12-21 MED ORDER — GADOBUTROL 1 MMOL/ML IV SOLN
7.5000 mL | Freq: Once | INTRAVENOUS | Status: AC | PRN
  Administered 2021-12-21: 7.5 mL via INTRAVENOUS

## 2021-12-21 MED ORDER — SODIUM CHLORIDE 0.9 % IV SOLN: 250.0000 mL | INTRAVENOUS | Status: DC | PRN

## 2021-12-21 MED ORDER — AMITRIPTYLINE HCL 10 MG PO TABS: 10.0000 mg | ORAL_TABLET | Freq: Every day | ORAL | Status: DC

## 2021-12-21 MED ORDER — VITAMIN D 25 MCG (1000 UNIT) PO TABS
2000.0000 [IU] | ORAL_TABLET | Freq: Every day | ORAL | Status: DC
  Administered 2021-12-22: 2000 [IU] via ORAL
  Filled 2021-12-21: qty 2

## 2021-12-21 NOTE — Assessment & Plan Note (Addendum)
?  Patient with a history of multiple TIAs as well as CVA in the past who presents for evaluation of mental status changes and a right facial droop that has resolved. ?Initial CT scan of the head without contrast is negative for intracranial hemorrhage ?Follow-up results of MRI of the brain ?Continue aspirin.  Patient not on statins due to myalgia ?We will request PT/OT/ST consult ?We will hold off on consulting neurology unless the MRI shows an acute stroke. ?

## 2021-12-21 NOTE — Progress Notes (Signed)
*  PRELIMINARY RESULTS* ?Echocardiogram ?2D Echocardiogram has been performed. ? ?Brianna Woods ?12/21/2021, 4:26 PM ?

## 2021-12-21 NOTE — Assessment & Plan Note (Signed)
Blood pressure is stable 

## 2021-12-21 NOTE — Progress Notes (Signed)
SLP Cancellation Note ? ?Patient Details ?Name: Brianna Woods ?MRN: [id number] ?DOB: 01-04-1935 ? ? ?Cancelled treatment:       Reason Eval/Treat Not Completed: SLP screened, no needs identified, will sign off (chart reviewed; consulted NSG and met w/ pt) ?Pt denied any difficulty swallowing, and NSG is currently ordering mech soft diet for ease of gumming/mashing foods d/t Edentulous status; tolerates swallowing pills w/ water w/ NSG. Pt conversed in conversation w/out expressive/receptive deficits noted; pt denied any speech-language deficits. Speech clear in setting of Edentulous status. No oral droop noted. NSG is going to monitor pt's tray setup and opening cartons at meals. ?No further skilled ST services indicated as pt appears at her baseline. Pt agreed. NSG to reconsult if any change in status while admitted.   ? ? ? ? ?Orinda Kenner, MS, CCC-SLP ?Speech Language Pathologist ?[facility]; [place] ?[phone] (ascom) ?Ishana Blades ?12/21/2021, 7:12 PM ?

## 2021-12-21 NOTE — Progress Notes (Signed)
Cross Cover ?Dysphagia 3 diet ordered per SLP recommendations ?

## 2021-12-21 NOTE — ED Triage Notes (Signed)
Symptoms started 0930 and resolved 1057 today  ?

## 2021-12-21 NOTE — H&P (Signed)
?History and Physical  ? ? ?Patient: Brianna Woods SFK:[id number] DOB: July 16, 1935 ?DOA: 12/21/2021 ?DOS: the patient was seen and examined on 12/21/2021 ?PCP: Ria Bush, MD  ?Patient coming from: Home ? ?Chief Complaint:  ?Chief Complaint  ?Patient presents with  ? Altered Mental Status  ?  Pt more lethargic per facility, per ems, right side droop that has resolved at this time, pt axox1-2 at baseline , 113 blood sugar   ? ?HPI: Brianna Woods is a 87 y.o. female with medical history significant for CVA, morbid obesity, GERD, hypertension and dyslipidemia ER via EMS for evaluation of change in mental status and a right-sided facial droop that had resolved by the time she arrived in the ER. ?Per patient she was not responding appropriately this morning and the nursing home staff were advised to send her to the ER for evaluation. ?During my evaluation she is oriented to person, place and time and complains about dysuria and frequency.  She also complains of a severe headache.  She denies having any fever or chills. ?She states that her oral intake has been poor but she denies having any abdominal pain, no nausea, no vomiting, no diarrhea or any changes in her bowel habits. ?She has no chest pain, no shortness of breath, no palpitations, no diaphoresis, no blurred vision or any focal deficit. ?Review of Systems: As mentioned in the history of present illness. All other systems reviewed and are negative. ?Past Medical History:  ?Diagnosis Date  ? Anemia   ? Arthritis   ? Asthma   ? per pt  ? Atherosclerosis of abdominal aorta ([place]) 07/2015  ? by CT  ? Barrett's esophagus   ? on EGD 2008, EGD WNL 2013  ? CAD (coronary artery disease) 07/2015  ? of LAD by CT  ? Diverticulosis 2014  ? sigmoid on colonoscopy  ? Fatty pancreas 07/2015  ? by CT  ? Fibromyalgia   ? per pt, no records of this   ? GERD (gastroesophageal reflux disease)   ? and esoph stricture s/p dilation 2008 [place] by CT 2016  ? Glaucoma   ? History of anxiety   ?  was on prozac then effexor (pt denies h/o anxiety/depression)  ? History of cardiac murmur   ? History of chicken pox   ? History of CVA (cerebrovascular accident) 2008  ? "I've had several TIAs"  ? History of right bundle branch block 2011  ? History of ulcer disease   ? per pt, no records of this  ? HLD (hyperlipidemia)   ? HTN (hypertension)   ? Kidney cyst, acquired 07/2015  ? by CT, rec rpt renal MRI in 6 months  ? Mixed incontinence   ? on ditropan  ? Nephrolithiasis   ? s/p surgery R kidney (66m) 11/2009  ? Nodular goiter, toxic or with hyperthyroidism   ? h/o toxic, on methimazole, known large left thyroid nodule 08/2013 s/p beign biopsy per patient 2010, saw Dr. SGabriel Carina Rad I ablation 04/2014  ? Vitamin D deficiency   ? ?Past Surgical History:  ?Procedure Laterality Date  ? CATARACT EXTRACTION  1990  ? w/ implants  ? CHOLECYSTECTOMY  2006  ? COLONOSCOPY  10/02/12  ? diverticulosis, o/w WNL ([place])  ? dexa    ? no records received  ? ESOPHAGOGASTRODUODENOSCOPY  11/2011  ? LA grade A esophagitis lower 1/3, dilated, med HH, o/w WNL - path: + GERD, no barrett's - f/u 11/2016  ? LITHOTRIPSY Right 2011  ? thyroid uptake  scan  04/2014  ? unifrm uptake, enlarged thyroid consistent with grave's dz  ? ?Social History:  reports that she has never smoked. She has never used smokeless tobacco. She reports that she does not drink alcohol and does not use drugs. ? ?Allergies  ?Allergen Reactions  ? Ivp Dye [Iodinated Contrast Media]   ?  Turns red; BP and HR go up  ? Penicillins   ?  "Sends me in left field"  ? Statins Other (See Comments)  ?  Body aches (lipitor, simvastatin, pravastatin)  ? ? ?Family History  ?Problem Relation Age of Onset  ? Hyperlipidemia Mother   ? Hypertension Mother   ? Stroke Father   ?     hemorrhage  ? Aneurysm Father 34  ?     deceased  ? Other Brother   ?     TB  ? Coronary artery disease Brother   ? Aneurysm Son 59  ?     sudden death  ? Diabetes Maternal Aunt   ? Cancer Brother 42  ?     lung   ? ? ?Prior to Admission medications   ?Medication Sig Start Date End Date Taking? Authorizing Provider  ?acetaminophen (TYLENOL) 500 MG tablet Take 1,000 mg by mouth 3 (three) times daily.   Yes [provider]  ?albuterol (VENTOLIN HFA) 108 (90 Base) MCG/ACT inhaler Inhale 1 puff into the lungs every 4 (four) hours as needed for wheezing or shortness of breath.   Yes [provider]  ?alum & mag hydroxide-simeth (MAALOX PLUS) 400-400-40 MG/5ML suspension Take 30 mLs by mouth every 6 (six) hours as needed for indigestion.   Yes [provider]  ?amitriptyline (ELAVIL) 10 MG tablet Take 10 mg by mouth at bedtime. (Take with 25 mg tablet to total of 35 mg) 12/06/21  Yes [provider]  ?amitriptyline (ELAVIL) 25 MG tablet Take 25 mg by mouth at bedtime. (Take with 10 mg tablet to total of 35 mg) 12/06/21  Yes [provider]  ?Cholecalciferol (VITAMIN D3) 25 MCG (1000 UT) CAPS Take 2,000 Units by mouth daily. 11/29/21  Yes [provider]  ?Cranberry 400 MG CAPS Take 400 mg by mouth daily. 10/06/21  Yes [provider]  ?diclofenac Sodium (VOLTAREN) 1 % GEL Apply 2 g topically 2 (two) times daily.   Yes [provider]  ?diphenhydrAMINE-PE-APAP 6.25-2.5-160 MG/5ML LIQD Take 10 mLs by mouth every 6 (six) hours as needed (cold symptoms).   Yes [provider]  ?divalproex (DEPAKOTE SPRINKLE) 125 MG capsule Take 250 mg by mouth 2 (two) times daily.   Yes [provider]  ?levothyroxine (SYNTHROID) 75 MCG tablet Take 75 mcg by mouth daily. 12/21/21  Yes [provider]  ?memantine (NAMENDA) 5 MG tablet Take 5 mg by mouth 2 (two) times daily. 12/13/21  Yes [provider]  ?Multiple Vitamin (MULTIVITAMIN WITH MINERALS) TABS tablet Take 1 tablet by mouth daily.   Yes [provider]  ?omeprazole (PRILOSEC) 20 MG capsule Take 20 mg by mouth daily. 12/06/21  Yes [provider]  ?pregabalin (LYRICA) 200 MG  capsule Take 200 mg by mouth 2 (two) times daily. 12/20/21  Yes [provider]  ?sennosides-docusate sodium (SENOKOT-S) 8.6-50 MG tablet Take 1 tablet by mouth 2 (two) times daily.   Yes [provider]  ?traMADol (ULTRAM) 50 MG tablet Take 50 mg by mouth 3 (three) times daily. 12/01/21  Yes [provider]  ?pirbuterol (MAXAIR) 200 MCG/INH inhaler  Inhale 2 puffs into the lungs 4 (four) times daily. 09/28/11 01/22/12  Ria Bush, MD  ? ? ?Physical Exam: ?Vitals:  ? 12/21/21 1116 12/21/21 1118 12/21/21 1300  ?BP:  (!) 160/81 134/86  ?Pulse: 96  82  ?Resp: 17  15  ?Temp: 98.5 ?F (36.9 ?C)    ?TempSrc: Oral    ?SpO2: 93%  92%  ? ?Physical Exam ?Vitals and nursing note reviewed.  ?Constitutional:   ?   Appearance: She is obese.  ?HENT:  ?   Head: Normocephalic and atraumatic.  ?   Nose: Nose normal.  ?   Mouth/Throat:  ?   Mouth: Mucous membranes are dry.  ?Eyes:  ?   Pupils: Pupils are equal, round, and reactive to light.  ?Cardiovascular:  ?   Rate and Rhythm: Normal rate and regular rhythm.  ?Pulmonary:  ?   Effort: Pulmonary effort is normal.  ?   Breath sounds: Normal breath sounds.  ?Abdominal:  ?   General: Bowel sounds are normal.  ?   Palpations: Abdomen is soft.  ?   Comments: Central adiposity  ?Musculoskeletal:  ?   Cervical back: Normal range of motion and neck supple.  ?   Right lower leg: Edema present.  ?Skin: ?   General: Skin is warm and dry.  ?Neurological:  ?   General: No focal deficit present.  ?   Mental Status: She is alert and oriented to person, place, and time.  ?Psychiatric:     ?   Mood and Affect: Mood normal.     ?   Behavior: Behavior normal.  ? ? ?Data Reviewed: ?Relevant notes from primary care and specialist visits, past discharge summaries as available in EHR, including Care Everywhere. ?Prior diagnostic testing as pertinent to current admission diagnoses ?Updated medications and problem lists for reconciliation ?ED course, including vitals, labs,  imaging, treatment and response to treatment ?Triage notes, nursing and pharmacy notes and ED provider's notes ?Notable results as noted in HPI ?Labs reviewed essentially negative except for pyuria, urine drug scre

## 2021-12-21 NOTE — ED Notes (Signed)
Informed RN bed assigned 

## 2021-12-21 NOTE — Assessment & Plan Note (Signed)
Continue Synthroid °

## 2021-12-21 NOTE — ED Triage Notes (Signed)
Altered , per ems right side droop that has resolved, pt at baseline per ems , blood glucose 113 ?

## 2021-12-21 NOTE — Assessment & Plan Note (Signed)
Patient noted to have mildly increased size of an extra-axial 2.2 cm mass along the inferior and anterior left frontal convexity.  ?This probably represents a meningioma and there is no significant mass effect, but recommend MRI with contrast to better characterize and to confirm. ?Follow-up results of MRI of the brain ?

## 2021-12-21 NOTE — Assessment & Plan Note (Signed)
Continue Namenda

## 2021-12-21 NOTE — Assessment & Plan Note (Addendum)
Patient noted to have pyuria and has a history of recurrent UTI ?We will place patient empirically on Rocephin 1 g IV daily ?Follow-up results of urine culture ?

## 2021-12-21 NOTE — ED Provider Notes (Signed)
? ?[facility] ?Provider Note ? ? ? Event Date/Time  ? First MD Initiated Contact with Patient 12/21/21 1116   ?  (approximate) ? ? ?History  ? ?Altered Mental Status (Pt more lethargic per facility, per ems, right side droop that has resolved at this time, pt axox1-2 at baseline , 113 blood sugar ) ? ? ?HPI ? ?Brianna Woods is a 87 y.o. female  with a PMH of bvggdementia, chronicCHF, CVA, HTN, HLD, hypothyroidism, GERD/Barrett's, asthma, neuropathy, urinary incontinence bedbound and oriented x1 at baseline who presents via EMS for evaluation of increased confusion and lethargy.  Per EMS she was in her normal state health around 9:30 AM and they were called to assess her and they noted right-sided facial droop that resolved by time she arrived to the emergency room.  When asked why she is emergency room patient states "I blacked out".  She endorses some chest tightness today but denies any other acute pain including in the head, abdomen, back or extremities.  She denies any cough, fevers, vomiting, diarrhea or nausea.  She states she has some weakness from prior stroke but is not able to clarify where she is usually weak.  She is unable provide any other additional history. ? ?  ? ? ?Physical Exam  ?Triage Vital Signs: ?ED Triage Vitals [12/21/21 1116]  ?Enc Vitals Group  ?   BP   ?   Pulse Rate 96  ?   Resp 17  ?   Temp 98.5 ?F (36.9 ?C)  ?   Temp Source Oral  ?   SpO2 93 %  ?   Weight   ?   Height   ?   Head Circumference   ?   [place] Flow   ?   Pain Score 0  ?   Pain Loc   ?   Pain Edu?   ?   Excl. in [place]?   ? ? ?Most recent vital signs: ?Vitals:  ? 12/21/21 1118 12/21/21 1300  ?BP: (!) 160/81 134/86  ?Pulse:  82  ?Resp:  15  ?Temp:    ?SpO2:  92%  ? ? ?General: Awake, no distress.  ?CV:  Good peripheral perfusion.  2+ radial pulses. ?Resp:  Normal effort.  Clear bilaterally. ?Abd:  No distention.  Soft. ?Other:  Some mild lower extremity edema.  Patient is oriented x2 for year and place.  She is  not oriented to month.  PERRLA.  EOMI.  She is able to move her toes and left foot but able to heel off the bed.  She is able to lift the heel of the right foot off the bed.  Sensation is intact light touch in all extremities.  She is also grips examiner with both hands of the feet slightly weaker in the left.  She is not participate in pronator drift or finger dysmetria testing. ? ? ?ED Results / Procedures / Treatments  ?Labs ?(all labs ordered are listed, but only abnormal results are displayed) ?Labs Reviewed  ?COMPREHENSIVE METABOLIC PANEL - Abnormal; Notable for the following components:  ?    Result Value  ? Albumin 3.4 (*)   ? All other components within normal limits  ?URINE DRUG SCREEN, QUALITATIVE ([facility] ONLY) - Abnormal; Notable for the following components:  ? Tricyclic, Ur Screen POSITIVE (*)   ? All other components within normal limits  ?URINALYSIS, ROUTINE W REFLEX MICROSCOPIC - Abnormal; Notable for the following components:  ? Color, Urine YELLOW (*)   ?  APPearance CLOUDY (*)   ? Hgb urine dipstick MODERATE (*)   ? Leukocytes,Ua MODERATE (*)   ? WBC, UA >50 (*)   ? Bacteria, UA MANY (*)   ? All other components within normal limits  ?TSH - Abnormal; Notable for the following components:  ? TSH 8.878 (*)   ? All other components within normal limits  ?RESP PANEL BY RT-PCR (FLU A&B, COVID) ARPGX2  ?PROTIME-INR  ?APTT  ?CBC  ?DIFFERENTIAL  ?ETHANOL  ?T4, FREE  ?CBG MONITORING, ED  ?TROPONIN I (HIGH SENSITIVITY)  ?TROPONIN I (HIGH SENSITIVITY)  ? ? ? ?EKG ? ?ECG is remarkable for sinus rhythm with a ventricular rate of 80, PR interval of 221 representing first-degree block with some nonspecific ST changes in anterior leads without other clear evidence of acute ischemia.  Right bundle branch block is noted.  QTc interval is 501. ? ? ?RADIOLOGY ?Chest x-ray shows some cardiomegaly and faint bilateral opacities suggestive of atelectasis without clear focal consolidation, pneumothorax or overt edema.  I  also reviewed radiology interpretation and agree with their findings. ? ?CT head reviewed by myself without evidence of edema, clear acute ischemia, mass effect or other clear acute process.  I reviewed radiologist rotation and agree to findings of a 2.2 cm mass along the inferior and anterior left frontal convexity consistent with a meningioma possibly slightly larger than prior as well as evidence of chronic microvascular ischemic disease.  No other acute process noted by radiology. ? ? ?PROCEDURES: ? ?Critical Care performed: No ? ?.1-3 Lead EKG Interpretation ?Performed by: Lucrezia Starch, MD ?Authorized by: Lucrezia Starch, MD  ? ?  Interpretation: normal   ?  ECG rate assessment: normal   ?  Rhythm: sinus rhythm   ?  Ectopy: none   ?  Conduction: abnormal   ?  Abnormal conduction: 1st degree AV block   ? ?The patient is on the cardiac monitor to evaluate for evidence of arrhythmia and/or significant heart rate changes. ? ? ?MEDICATIONS ORDERED IN ED: ?Medications  ?cefTRIAXone (ROCEPHIN) 1 g in sodium chloride 0.9 % 100 mL IVPB (has no administration in time range)  ? ? ? ?IMPRESSION / MDM / ASSESSMENT AND PLAN / ED COURSE  ?I reviewed the triage vital signs and the nursing notes. ?             ?               ? ?Differential diagnosis includes, but is not limited to TIA given reported facial droop but states it resolved prior to arrival, infectious process, acute endocrine derangement, metabolic derangement, SAH or other acute cranial process. ? ?Given she is reporting some chest tightness we will also check EKG troponin to assess for any evidence of cardiac ischemia as well as a chest x-ray to look for possible occult pneumonia.  No wheezing to assess acute obstructive airway exacerbation if she does not currently have pain of the low suspicion for PE or dissection at this time. ? ?EKG and nonelevated troponin are suggestive of ACS. ? ? ?Chest x-ray shows some cardiomegaly and faint bilateral opacities  suggestive of atelectasis without clear focal consolidation, pneumothorax or overt edema.  I also reviewed radiology interpretation and agree with their findings. ? ?CT head reviewed by myself without evidence of edema, clear acute ischemia, mass effect or other clear acute process.  I reviewed radiologist rotation and agree to findings of a 2.2 cm mass along the inferior and anterior left  frontal convexity consistent with a meningioma possibly slightly larger than prior as well as evidence of chronic microvascular ischemic disease.  No other acute process noted by radiology. ? ?CMP shows no significant electrolyte or metabolic derangements.  CBC shows no leukocytosis or acute anemia.  PTT and INR are WNL.  Ethanol is undetectable and TSH is elevated 8.878.  Free T4 is WNL.  UDS is positive for tricyclic's but otherwise negative.  UA is concerning for cystitis with moderate leuk trase -50 WBCs BCs and many bacteria noted.  We will give a dose Rocephin and order urine culture.  This could certainly be contributing to her confusion reported earlier although not necessarily explaining right-sided facial droop seen by EMS which has now resolved.  I am concerned for TIA.  Lower vessel imaging of the head or neck with MRIs as well as MR brain to further assess likely meningioma.  I will admit to medicine service for further evaluation and management. ? ?  ? ? ?FINAL CLINICAL IMPRESSION(S) / ED DIAGNOSES  ? ?Final diagnoses:  ?Acute cystitis without hematuria  ?TIA (transient ischemic attack)  ? ? ? ?Rx / DC Orders  ? ?ED Discharge Orders   ? ? None  ? ?  ? ? ? ?Note:  This document was prepared using Dragon voice recognition software and may include unintentional dictation errors. ?  ?Lucrezia Starch, MD ?12/21/21 1435 ? ?

## 2021-12-22 DIAGNOSIS — G459 Transient cerebral ischemic attack, unspecified: Secondary | ICD-10-CM | POA: Diagnosis not present

## 2021-12-22 LAB — BASIC METABOLIC PANEL
Anion gap: 8 (ref 5–15)
BUN: 15 mg/dL (ref 8–23)
CO2: 29 mmol/L (ref 22–32)
Calcium: 9.1 mg/dL (ref 8.9–10.3)
Chloride: 102 mmol/L (ref 98–111)
Creatinine, Ser: 0.69 mg/dL (ref 0.44–1.00)
GFR, Estimated: >60 mL/min (ref >60)
Glucose, Bld: 91 mg/dL (ref 70–99)
Potassium: 3.8 mmol/L (ref 3.5–5.1)
Sodium: 139 mmol/L (ref 135–145)

## 2021-12-22 LAB — LIPID PANEL
Cholesterol: 273 mg/dL — ABNORMAL HIGH (ref 0–200)
HDL: 46 mg/dL (ref >40)
LDL Cholesterol: 204 mg/dL — ABNORMAL HIGH (ref 0–99)
Total CHOL/HDL Ratio: 5.9 RATIO
Triglycerides: 113 mg/dL (ref <150)
VLDL: 23 mg/dL (ref 0–40)

## 2021-12-22 LAB — CBC
HCT: 36.7 % (ref 36.0–46.0)
Hemoglobin: 11.8 g/dL — ABNORMAL LOW (ref 12.0–15.0)
MCH: 28.6 pg (ref 26.0–34.0)
MCHC: 32.2 g/dL (ref 30.0–36.0)
MCV: 89.1 fL (ref 80.0–100.0)
Platelets: 179 10*3/uL (ref 150–400)
RBC: 4.12 MIL/uL (ref 3.87–5.11)
RDW: 13.6 % (ref 11.5–15.5)
WBC: 6.9 10*3/uL (ref 4.0–10.5)
nRBC: 0 % (ref 0.0–0.2)

## 2021-12-22 LAB — HEMOGLOBIN A1C
Hgb A1c MFr Bld: 5.3 % (ref 4.8–5.6)
Mean Plasma Glucose: 105.41 mg/dL

## 2021-12-22 MED ORDER — CEPHALEXIN 500 MG PO CAPS: 500.0000 mg | ORAL_CAPSULE | Freq: Three times a day (TID) | ORAL | 0 refills | Status: AC

## 2021-12-22 MED ORDER — ASPIRIN 81 MG PO CHEW: 81.0000 mg | CHEWABLE_TABLET | Freq: Every day | ORAL | Status: AC

## 2021-12-22 NOTE — Hospital Course (Addendum)
Taken from H&P. ? ?Brianna Woods is a 87 y.o. female with medical history significant for CVA, morbid obesity, GERD, hypertension and dyslipidemia ER via EMS for evaluation of change in mental status and a right-sided facial droop that had resolved by the time she arrived in the ER. ?Per patient she was not responding appropriately this morning and the nursing home staff were advised to send her to the ER for evaluation. ?During my evaluation she is oriented to person, place and time and complains about dysuria and frequency.  She also complains of a severe headache.  She denies having any fever or chills. ?She states that her oral intake has been poor but she denies having any abdominal pain, no nausea, no vomiting, no diarrhea or any changes in her bowel habits. ?She has no chest pain, no shortness of breath, no palpitations, no diaphoresis, no blurred vision or any focal deficit. ? ?ED course.  On presentation she was hemodynamically stable, afebrile.  Labs only positive for pyuria, UDS positive for tricyclic's, TSH elevated at 8.8 with normal free T4. ?Chest x-ray with cardiomegaly, linear densities in the left lower lung field may suggest subsegmental atelectasis.  Left lateral CP angle is indistinct, possibly suggesting minimal effusion. ?CT head was negative for any acute infarct, did show prior extra-axial 2.2 cm mass along the inferior and anterior left frontal convexity, most likely meningioma, no mass effect/ ?MRI brain was also negative for any acute infarct, and they comment normal recent change in that meningioma noted as compared to the prior imaging done in 2020. ?MRA head and neck was negative for any large vessel occlusion.  There was a poor opacification noted at proximal left vertebral artery which was not specifically characterized but distant vertebral artery was patent. ?Lipid profile with total cholesterol of 273 and LDL of 204 with a goal less than 70. ?Patient was started on ceftriaxone and  urine cultures are pending. ?Echocardiogram with normal EF, grade 2 diastolic dysfunction and no other significant abnormality. ? ?Patient will continue low-dose aspirin. ?She was not on any statin at home due to statin allergies. ?Patient should discussed with primary care provider and consider taking PCSK9 inhibitor therapy due to intolerance to statin and markedly elevated LDL. ? ?For concern of UTI she received ceftriaxone while in the hospital and sent back to facility with Keflex pending final urine culture results.  Skilled nursing facility or PCP should be able to follow-up to see if any change of antibiotics warranted. ? ?Patient is at her baseline and will continue current medications except the addition of low-dose aspirin and a 5-day course of antibiotics. ? ?She will follow-up with her doctors as an outpatient for further recommendations. ? ?

## 2021-12-22 NOTE — [place] FL2 (Signed)
? MEDICAID FL2 LEVEL OF CARE SCREENING TOOL  ?  ? ?IDENTIFICATION  ?Patient Name: ?Brianna Woods Birthdate: 06/14/1935 Sex: female Admission Date (Current Location): ?12/21/2021  ?[place] and [place] Number: ? [place] ?  Facility and Address:  ?[facility], [address] [place], [place] [address] ?     Provider Number: ?[id number]  ?Attending Physician Name and Address:  ?Lorella Nimrod, MD ? Relative Name and Phone Number:  ?Ross,Maribeth (Relative)   [phone] (Work Phone) ?   ?Current Level of Care: ?Hospital Recommended Level of Care: ?Nursing Facility Prior Approval Number: ?  ? ?Date Approved/Denied: ?  PASRR Number: ?[id number] A ? ?Discharge Plan: ?SNF ?  ? ?Current Diagnoses: ?Patient Active Problem List  ? Diagnosis Date Noted  ? TIA (transient ischemic attack) 12/21/2021  ? UTI (urinary tract infection) 12/21/2021  ? Dementia ([place]) 12/21/2021  ? Meningioma ([place]) 12/21/2021  ? Protein-calorie malnutrition, severe 11/29/2019  ? Palliative care by specialist   ? Hypokalemia   ? Hypoglycemia   ? Adult failure to thrive   ? Hypernatremia   ? Altered mental status 11/23/2019  ? Encephalopathy   ? Somnolence 10/13/2019  ? Low back pain 02/11/2019  ? Acquired hypothyroidism 02/11/2019  ? Injury of face 11/21/2018  ? MDD (major depressive disorder), single episode, moderate ([place]) 07/09/2018  ? Glaucoma   ? Osteopenia 06/09/2018  ? Hiatal hernia 06/09/2018  ? Diverticulosis 06/09/2018  ? Weight loss 05/23/2018  ? Dysphagia 05/23/2018  ? Insomnia 05/23/2018  ? Uterine mass 05/23/2018  ? Tricompartment osteoarthritis of right knee 02/13/2018  ? Skin rash 04/21/2016  ? Increased endometrial stripe thickness 10/21/2015  ? Nephrolithiasis   ? Pedal edema 09/25/2015  ? Generalized abdominal pain 07/26/2015  ? Kidney cyst, acquired 07/13/2015  ? CAD (coronary artery disease) 07/13/2015  ? Atherosclerosis of abdominal aorta ([place]) 07/13/2015  ? Fatty pancreas 07/13/2015  ? Goals of care,  counseling/discussion 03/22/2015  ? Intertrigo 03/22/2015  ? Advanced care planning/counseling discussion 09/21/2014  ? Medicare annual wellness visit, subsequent 03/20/2014  ? Urinary incontinence 03/09/2014  ? Chest discomfort 08/15/2013  ? Anxiety attack 05/18/2012  ? History of right bundle branch block   ? Vitamin D deficiency 02/10/2012  ? Dizziness 01/22/2012  ? Fibromyalgia   ? Asthma   ? Nodular goiter, toxic or with hyperthyroidism   ? GERD (gastroesophageal reflux disease)   ? Mixed incontinence   ? HTN (hypertension)   ? HLD (hyperlipidemia)   ? ? ?Orientation RESPIRATION BLADDER Height & Weight   ?  ?Self, Time, Situation ? Normal External catheter Weight: 65 kg ?Height:  5' (152.4 cm)  ?BEHAVIORAL SYMPTOMS/MOOD NEUROLOGICAL BOWEL NUTRITION STATUS  ?    Incontinent Diet (DYS 3)  ?AMBULATORY STATUS COMMUNICATION OF NEEDS Skin   ?Total Care Verbally Normal ?  ?  ?  ?    ?     ?     ? ? ?Personal Care Assistance Level of Assistance  ?Bathing, Feeding, Dressing Bathing Assistance: Limited assistance ?Feeding assistance: Limited assistance ?Dressing Assistance: Limited assistance ?   ? ?Functional Limitations Info  ?Sight, Hearing, Speech Sight Info: Impaired ?Hearing Info: Impaired ?Speech Info: Adequate  ? ? ?SPECIAL CARE FACTORS FREQUENCY  ?    ?  ?  ?  ?  ?  ?  ?   ? ? ?Contractures Contractures Info: Not present  ? ? ?Additional Factors Info  ?Code Status, Allergies Code Status Info: DNR ?Allergies Info: Statins Penicillins IVP dye ?  ?  ?  ?   ? ?  Current Medications (12/22/2021):  This is the current hospital active medication list ?Current Facility-Administered Medications  ?Medication Dose Route Frequency Provider Last Rate Last Admin  ? 0.9 %  sodium chloride infusion  250 mL Intravenous PRN Agbata, Tochukwu, MD      ? acetaminophen (TYLENOL) tablet 650 mg  650 mg Oral Q6H PRN Agbata, Tochukwu, MD      ? Or  ? acetaminophen (TYLENOL) suppository 650 mg  650 mg Rectal Q6H PRN Agbata, Tochukwu, MD       ? acetaminophen (TYLENOL) tablet 1,000 mg  1,000 mg Oral TID Agbata, Tochukwu, MD   1,000 mg at 12/22/21 0853  ? amitriptyline (ELAVIL) tablet 35 mg  35 mg Oral QHS Dorothe Pea, RPH   35 mg at 12/21/21 2328  ? aspirin chewable tablet 81 mg  81 mg Oral Daily Agbata, Tochukwu, MD   81 mg at 12/22/21 0853  ? cefTRIAXone (ROCEPHIN) 1 g in sodium chloride 0.9 % 100 mL IVPB  1 g Intravenous Q24H Agbata, Tochukwu, MD      ? cholecalciferol (VITAMIN D3) tablet 2,000 Units  2,000 Units Oral Daily Agbata, Tochukwu, MD   2,000 Units at 12/22/21 0853  ? divalproex (DEPAKOTE SPRINKLE) capsule 250 mg  250 mg Oral BID Agbata, Tochukwu, MD   250 mg at 12/22/21 0853  ? enoxaparin (LOVENOX) injection 40 mg  40 mg Subcutaneous Q24H Agbata, Tochukwu, MD      ? levothyroxine (SYNTHROID) tablet 75 mcg  75 mcg Oral Daily Agbata, Tochukwu, MD   75 mcg at 12/22/21 0648  ? memantine (NAMENDA) tablet 5 mg  5 mg Oral BID Agbata, Tochukwu, MD   5 mg at 12/22/21 0940  ? multivitamin with minerals tablet 1 tablet  1 tablet Oral Daily Agbata, Tochukwu, MD   1 tablet at 12/22/21 0853  ? ondansetron (ZOFRAN) tablet 4 mg  4 mg Oral Q6H PRN Agbata, Tochukwu, MD      ? Or  ? ondansetron (ZOFRAN) injection 4 mg  4 mg Intravenous Q6H PRN Agbata, Tochukwu, MD      ? pantoprazole (PROTONIX) EC tablet 40 mg  40 mg Oral Daily Agbata, Tochukwu, MD   40 mg at 12/22/21 0852  ? pregabalin (LYRICA) capsule 200 mg  200 mg Oral BID Agbata, Tochukwu, MD   200 mg at 12/22/21 0853  ? senna-docusate (Senokot-S) tablet 1 tablet  1 tablet Oral BID Collier Bullock, MD   1 tablet at 12/22/21 0852  ? sodium chloride flush (NS) 0.9 % injection 3 mL  3 mL Intravenous Q12H Agbata, Tochukwu, MD   3 mL at 12/22/21 0856  ? sodium chloride flush (NS) 0.9 % injection 3 mL  3 mL Intravenous PRN Agbata, Tochukwu, MD      ? traMADol (ULTRAM) tablet 50 mg  50 mg Oral TID Collier Bullock, MD   50 mg at 12/22/21 0853  ? ? ? ?Discharge Medications: ?Please see discharge summary  for a list of discharge medications. ? ?Relevant Imaging Results: ? ?Relevant Lab Results: ? ? ?Additional Information ?  ? ?Pete Pelt, RN ? ? ? ? ?

## 2021-12-22 NOTE — Evaluation (Signed)
Occupational Therapy Evaluation ?Patient Details ?Name: Brianna Woods ?MRN: [id number] ?DOB: 06/18/1935 ?Today's Date: 12/22/2021 ? ? ?History of Present Illness presented to ER seocndary to AMS, R facial droop (resolved by time of arrival); admitted for TIA/CVA work up. MRI negative for acute infarct; additional medical work-up significant for UTI.  ? ?Clinical Impression ?  ?Brianna Woods was seen for OT/PT co-evaluation this date. Pt inconsistent historian, initially reports she moved to [facility] last week then states ~2 years. Pt currently requires SETUP self-feeding at bed level. TOTAL Ax2 for ADL t/f. MAX A don B socks at bed level. Pt resident at [facility], currently near her baseline functional mobility, no skilled acute OT needs identified, will sign off. Upon hospital discharge, recommend no OT follow up.  ?  ? ?Recommendations for follow up therapy are one component of a multi-disciplinary discharge planning process, led by the attending physician.  Recommendations may be updated based on patient status, additional functional criteria and insurance authorization.  ? ?Follow Up Recommendations ? No OT follow up  ?  ?Assistance Recommended at Discharge Frequent or constant Supervision/Assistance  ?Patient can return home with the following Two people to help with bathing/dressing/bathroom ? ?  ?Functional Status Assessment ? Patient has not had a recent decline in their functional status  ?Equipment Recommendations ? Hospital bed  ?  ?Recommendations for Other Services   ? ? ?  ?Precautions / Restrictions Precautions ?Precautions: Fall ?Restrictions ?Weight Bearing Restrictions: No  ? ?  ? ?Mobility Bed Mobility ?Overal bed mobility: Needs Assistance ?Bed Mobility: Supine to Sit ?  ?  ?Supine to sit: Total assist, +2 for physical assistance ?  ?  ?  ?  ? ?Transfers ?Overall transfer level: Needs assistance ?  ?Transfers: Bed to chair/wheelchair/BSC ?  ?  ?  ?  ?  ? Lateral/Scoot Transfers: Total assist, +2 physical  assistance ?General transfer comment: heavy posterior lean ?  ? ?  ?Balance Overall balance assessment: Needs assistance ?Sitting-balance support: No upper extremity supported, Feet supported ?Sitting balance-Leahy Scale: Poor ?  ?Postural control: Posterior lean, Left lateral lean ?  ?  ?  ?  ?  ?  ?  ?  ?  ?  ?  ?  ?  ?  ?   ? ?ADL either performed or assessed with clinical judgement  ? ?ADL Overall ADL's : Needs assistance/impaired ?  ?  ?  ?  ?  ?  ?  ?  ?  ?  ?  ?  ?  ?  ?  ?  ?  ?  ?  ?General ADL Comments: SETUP self-feeding at bed level. TOTAL A for ADL t/f. MAX A don B socks at bed level  ? ? ? ? ?Pertinent Vitals/Pain Pain Assessment ?Pain Assessment: Faces ?Faces Pain Scale: Hurts little more ?Pain Location: L knee ?Pain Descriptors / Indicators: Grimacing ?Pain Intervention(s): Limited activity within patient's tolerance, Repositioned  ? ? ? ?Hand Dominance Left ?  ?Extremity/Trunk Assessment Upper Extremity Assessment ?Upper Extremity Assessment: Generalized weakness ?  ?Lower Extremity Assessment ?Lower Extremity Assessment: Defer to PT evaluation ?  ?  ?  ?Communication Communication ?Communication: No difficulties ?  ?Cognition Arousal/Alertness: Awake/alert ?Behavior During Therapy: [facility] for tasks assessed/performed ?Overall Cognitive Status: No family/caregiver present to determine baseline cognitive functioning ?  ?  ?  ?  ?  ?  ?  ?  ?  ?  ?  ?  ?  ?  ?  ?  ?General  Comments: Alert and oriented to self, location as hospital and year; follows simple commands.  Poor awareness of time, poor STM, pt contraindicates self several times t/o session ?  ?  ? ?Home Living Family/patient expects to be discharged to:: Skilled nursing facility ?  ?  ?  ?  ?  ?  ?  ?  ?  ?  ?  ?  ?  ?  ?  ?  ?Additional Comments: Resident of [facility] prior to admission; patient with inconsistent history, but functional status appears grossly comparable to prevoius admission (11/2019) ?  ? ?  ?Prior Functioning/Environment  Prior Level of Function : Needs assist ?  ?  ?  ?  ?  ?  ?Mobility Comments: Patient limited historian with inconsistent reporting; suggests WC level as primary mobility when OOB, but does indicate limited OOB opportunities at baseline. ?ADLs Comments: Assist from staff as needed; suggests ADLs completed while seated in WC, but question accuracy of this report ?  ? ?  ?  ?OT Problem List: Decreased strength;Decreased range of motion;Decreased activity tolerance;Impaired balance (sitting and/or standing) ?  ?   ?OT Treatment/Interventions:    ?  ?OT Goals(Current goals can be found in the care plan section) Acute Rehab OT Goals ?Patient Stated Goal: to feel better ?OT Goal Formulation: With patient ?Time For Goal Achievement: 01/05/22 ?Potential to Achieve Goals: Fair  ?OT Frequency:   ?  ? ?Co-evaluation PT/OT/SLP Co-Evaluation/Treatment: Yes ?Reason for Co-Treatment: Necessary to address cognition/behavior during functional activity;For patient/therapist safety;To address functional/ADL transfers ?PT goals addressed during session: Mobility/safety with mobility ?OT goals addressed during session: ADL's and self-care ?  ? ?  ?AM-PAC OT "6 Clicks" Daily Activity     ?Outcome Measure Help from another person eating meals?: None ?Help from another person taking care of personal grooming?: A Little ?Help from another person toileting, which includes using toliet, bedpan, or urinal?: A Lot ?Help from another person bathing (including washing, rinsing, drying)?: A Lot ?Help from another person to put on and taking off regular upper body clothing?: A Lot ?Help from another person to put on and taking off regular lower body clothing?: A Lot ?6 Click Score: 15 ?  ?End of Session Nurse Communication: Mobility status;Need for lift equipment ? ?Activity Tolerance: Patient tolerated treatment well ?Patient left: in chair;with call bell/phone within reach;with chair alarm set ? ?OT Visit Diagnosis: Muscle weakness (generalized)  (M62.81)  ?              ?Time: 0920-0950 ?OT Time Calculation (min): 30 min ?Charges:  OT General Charges ?$OT Visit: 1 Visit ?OT Evaluation ?$OT Eval Moderate Complexity: 1 Mod ? ?Dessie Coma, M.S. OTR/L  ?12/22/21, 11:26 AM  ?ascom [phone] ? ?

## 2021-12-22 NOTE — TOC Initial Note (Signed)
Transition of Care (TOC) - Initial/Assessment Note  ? ? ?Patient Details  ?Name: Brianna Woods ?MRN: [id number] ?Date of Birth: 1935/05/25 ? ?Transition of Care (TOC) CM/SW Contact:    ?Pete Pelt, RN ?Phone Number: ?12/22/2021, 10:23 AM ? ?Clinical Narrative:    Resident of [facility].  As per hospitalist, patient can return today.  Patient will go to room 403B as per Otila Kluver via [facility] notified             ? ? ?Expected Discharge Plan: [place] ?Barriers to Discharge: Barriers Resolved ? ? ?Patient Goals and CMS Choice ?  ?  ?Choice offered to / list presented to : NA ? ?Expected Discharge Plan and Services ?Expected Discharge Plan: [place] ?  ?  ?  ?Living arrangements for the past 2 months: [place] ?                ?  ?  ?  ?  ?  ?  ?  ?  ?  ?  ? ?Prior Living Arrangements/Services ?Living arrangements for the past 2 months: [place] ?Lives with:: Facility Resident ?Patient language and need for interpreter reviewed:: Yes ?       ?Need for Family Participation in Patient Care: Yes (Comment) ?Care giver support system in place?: Yes (comment) ?  ?Criminal Activity/Legal Involvement Pertinent to Current Situation/Hospitalization: No - Comment as needed ? ?Activities of Daily Living ?Home Assistive Devices/Equipment: Gilford Rile (specify type) ?ADL Screening (condition at time of admission) ?Patient's cognitive ability adequate to safely complete daily activities?: Yes ?Is the patient deaf or have difficulty hearing?: Yes ?Does the patient have difficulty seeing, even when wearing glasses/contacts?: No ?Does the patient have difficulty concentrating, remembering, or making decisions?: No ?Patient able to express need for assistance with ADLs?: Yes ?Does the patient have difficulty dressing or bathing?: Yes ?Independently performs ADLs?: No ?Communication: Needs assistance ?Is this a change from baseline?: Pre-admission baseline ?Dressing (OT):  Needs assistance ?Is this a change from baseline?: Pre-admission baseline ?Grooming: Needs assistance ?Is this a change from baseline?: Pre-admission baseline ?Feeding: Appropriate for developmental age (set-up) ?Bathing: Needs assistance ?Is this a change from baseline?: Pre-admission baseline ?Toileting: Needs assistance ?Is this a change from baseline?: Pre-admission baseline ?In/Out Bed: Needs assistance ?Is this a change from baseline?: Pre-admission baseline ?Walks in Home: Appropriate for developmental age ?Does the patient have difficulty walking or climbing stairs?: Yes ?Weakness of Legs: Both ?Weakness of Arms/Hands: None ? ?Permission Sought/Granted ?Permission sought to share information with : Case Manager ?Permission granted to share information with : Yes, Verbal Permission Granted ?   ? Permission granted to share info w AGENCY: [facility], [facility] ?   ?   ? ?Emotional Assessment ?Appearance:: Appears stated age ?  ?  ?Orientation: : Oriented to  Time, Oriented to Place, Oriented to Self ?Alcohol / Substance Use: Not Applicable ?Psych Involvement: No (comment) ? ?Admission diagnosis:  TIA (transient ischemic attack) [G45.9] ?Acute cystitis without hematuria [N30.00] ?Patient Active Problem List  ? Diagnosis Date Noted  ? TIA (transient ischemic attack) 12/21/2021  ? UTI (urinary tract infection) 12/21/2021  ? Dementia ([place]) 12/21/2021  ? Meningioma ([place]) 12/21/2021  ? Protein-calorie malnutrition, severe 11/29/2019  ? Palliative care by specialist   ? Hypokalemia   ? Hypoglycemia   ? Adult failure to thrive   ? Hypernatremia   ? Altered mental status 11/23/2019  ? Encephalopathy   ?  Somnolence 10/13/2019  ? Low back pain 02/11/2019  ? Acquired hypothyroidism 02/11/2019  ? Injury of face 11/21/2018  ? MDD (major depressive disorder), single episode, moderate ([place]) 07/09/2018  ? Glaucoma   ? Osteopenia 06/09/2018  ? Hiatal hernia 06/09/2018  ? Diverticulosis 06/09/2018  ? Weight loss  05/23/2018  ? Dysphagia 05/23/2018  ? Insomnia 05/23/2018  ? Uterine mass 05/23/2018  ? Tricompartment osteoarthritis of right knee 02/13/2018  ? Skin rash 04/21/2016  ? Increased endometrial stripe thickness 10/21/2015  ? Nephrolithiasis   ? Pedal edema 09/25/2015  ? Generalized abdominal pain 07/26/2015  ? Kidney cyst, acquired 07/13/2015  ? CAD (coronary artery disease) 07/13/2015  ? Atherosclerosis of abdominal aorta ([place]) 07/13/2015  ? Fatty pancreas 07/13/2015  ? Goals of care, counseling/discussion 03/22/2015  ? Intertrigo 03/22/2015  ? Advanced care planning/counseling discussion 09/21/2014  ? Medicare annual wellness visit, subsequent 03/20/2014  ? Urinary incontinence 03/09/2014  ? Chest discomfort 08/15/2013  ? Anxiety attack 05/18/2012  ? History of right bundle branch block   ? Vitamin D deficiency 02/10/2012  ? Dizziness 01/22/2012  ? Fibromyalgia   ? Asthma   ? Nodular goiter, toxic or with hyperthyroidism   ? GERD (gastroesophageal reflux disease)   ? Mixed incontinence   ? HTN (hypertension)   ? HLD (hyperlipidemia)   ? ?PCP:  Ria Bush, MD ?Pharmacy:  No Pharmacies Listed ? ? ? ?Social Determinants of Health (SDOH) Interventions ?  ? ?Readmission Risk Interventions ?   ? View : No data to display.  ?  ?  ?  ? ? ? ?

## 2021-12-22 NOTE — Evaluation (Signed)
Physical Therapy Evaluation ?Patient Details ?Name: Brianna Woods ?MRN: [id number] ?DOB: [birth year] ?Today's Date: 12/22/2021 ? ?History of Present Illness ? presented to ER seocndary to AMS, R facial droop (resolved by time of arrival); admitted for TIA/CVA work up. MRI negative for acute infarct; additional medical work-up significant for UTI.  ?Clinical Impression ? Patient resting in bed, finishing breakfast upon arrival to room.  Alert and oriented to self, location as hospital and year; follows simple commands. However, poor STM, poor recall of new/recent information, poor insight/awareness of deficits.  Endorses baseline, chronic pain to L knee; unchanged with this admission.  Globally weak and deconditioned throughout all extremities (LEs > UEs), requiring act assist from therapist for movement throughout full range to bilat LEs.  Bilat ankle DF lacking approx 20-25 degrees bilat.  Currently requiring total assist +2 for bed mobility; mod assist +1-2 for unsupported sitting balance (due to L post/lateral lean, mild pushing behaviors at times); total assist +2 for lateral scoot over level surfaces, bed/chair.  Constant manual assist for forward trunk lean, weight shift, lift off and lateral movement; patient consistently shifting weight/trunk backwards despite max cuing/assist from therapist.  Do recommend extensive assist +2 (scoot pivot) or use of hoyer lift for transfers at this time. ?Appears to be at baseline level of functional ability, total assist, for all transfers and mobility needs; no acute PT needs identified at this time.  Will complete PT orders at this time; please re-consult should needs change. ?   ? ?Recommendations for follow up therapy are one component of a multi-disciplinary discharge planning process, led by the attending physician.  Recommendations may be updated based on patient status, additional functional criteria and insurance authorization. ? ?Follow Up Recommendations No PT follow  up ? ?  ?Assistance Recommended at Discharge Frequent or constant Supervision/Assistance  ?Patient can return home with the following ? A lot of help with walking and/or transfers;A lot of help with bathing/dressing/bathroom ? ?  ?Equipment Recommendations    ?Recommendations for Other Services ?    ?  ?Functional Status Assessment Patient has not had a recent decline in their functional status  ? ?  ?Precautions / Restrictions Precautions ?Precautions: Fall ?Restrictions ?Weight Bearing Restrictions: No  ? ?  ? ?Mobility ? Bed Mobility ?Overal bed mobility: Needs Assistance ?Bed Mobility: Supine to Sit ?  ?  ?Supine to sit: Total assist, +2 for physical assistance ?  ?  ?General bed mobility comments: step by-step cuing for hand placement on rails; difficulty with purposeful release/termination of activity ?  ? ?Transfers ?Overall transfer level: Needs assistance ?  ?Transfers: Bed to chair/wheelchair/BSC ?  ?  ?  ?  ?  ? Lateral/Scoot Transfers: Total assist, +2 physical assistance ?General transfer comment: extensive assist for forward trunk lean/weight shift, lift off and lateral movement; constantly reverts to posterior lean/weight shift despite max cuing/assist throughout transfer ?  ? ?Ambulation/Gait ?  ?  ?  ?  ?  ?  ?  ?General Gait Details: unsafe/unable ? ?Stairs ?  ?  ?  ?  ?  ? ?Wheelchair Mobility ?  ? ?Modified Rankin (Stroke Patients Only) ?  ? ?  ? ?Balance Overall balance assessment: Needs assistance ?Sitting-balance support: No upper extremity supported, Feet supported ?Sitting balance-Leahy Scale: Poor ?Sitting balance - Comments: L posterior/lateral lean, mod assist with R UE support (on bedrail) to maintain; mild pushing behaviors noted with R UE in closed-chain contact with bed/seating surface ?  ?  ?  ?  ?  ?  ?  ?  ?  ?  ?  ?  ?  ?  ?  ?   ? ? ? ?  Pertinent Vitals/Pain Pain Assessment ?Pain Assessment: Faces ?Faces Pain Scale: Hurts little more ?Pain Location: L knee ?Pain Descriptors /  Indicators: Grimacing ?Pain Intervention(s): Limited activity within patient's tolerance, Monitored during session, Repositioned  ? ? ?Home Living Family/patient expects to be discharged to:: Skilled nursing facility ?  ?  ?  ?  ?  ?  ?  ?  ?  ?Additional Comments: Resident of [facility] prior to admission; patient with inconsistent history, but functional status appears grossly comparable to prevoius admission (11/2019)  ?  ?Prior Function Prior Level of Function : Needs assist ?  ?  ?  ?  ?  ?  ?Mobility Comments: Patient limited historian with inconsistent reporting; suggests WC level as primary mobility when OOB, but does indicate limited OOB opportunities at baseline. ?ADLs Comments: Assist from staff as needed; suggests ADLs completed while seated in WC, but question accuracy of this report ? ?Patient positioned in recliner with folded chuck pad under L IT to promote R ant/lateral weight shift and midline orientation in recliner; bilat UEs elevated and supported on pillows.  Tolerating position well; needs in reach. ?Do recommend use of hoyer for return to bed with nursing staff; sling placed in room for access when needed.  CNA informed/aware.  ? ? ?Hand Dominance  ? Dominant Hand: Left ? ?  ?Extremity/Trunk Assessment  ? Upper Extremity Assessment ?Upper Extremity Assessment: Generalized weakness (grossly 3-/5 throughout) ?  ? ?Lower Extremity Assessment ?Lower Extremity Assessment: Generalized weakness (grossly 2/5 thorughout; bilat ankle DF lackin approx 20-25 degrees bilat) ?  ? ?   ?Communication  ? Communication: No difficulties  ?Cognition Arousal/Alertness: Awake/alert ?Behavior During Therapy: [facility] for tasks assessed/performed ?Overall Cognitive Status: No family/caregiver present to determine baseline cognitive functioning ?  ?  ?  ?  ?  ?  ?  ?  ?  ?  ?  ?  ?  ?  ?  ?  ?General Comments: Alert and oriented to self, location as hospital and year; follows simple commands.  Poor awareness of time,  poor STM; limited ability to recall new/recent information (both repeating and contradicting self multiple times during session) ?  ?  ? ?  ?General Comments   ? ?  ?Exercises    ? ?Assessment/Plan  ?  ?PT Assessment Patient does not need any further PT services  ?PT Problem List Decreased strength;Decreased activity tolerance;Decreased balance;Decreased mobility;Decreased knowledge of use of DME;Decreased safety awareness ? ?   ?  ?PT Treatment Interventions Functional mobility training;Therapeutic activities;Therapeutic exercise;Balance training;Patient/family education   ? ?PT Goals (Current goals can be found in the Care Plan section)  ?Acute Rehab PT Goals ?Patient Stated Goal: to return to [facility] ?PT Goal Formulation: All assessment and education complete, DC therapy ?Time For Goal Achievement: 12/22/21 ?Potential to Achieve Goals: Fair ? ?  ?Frequency   ?  ? ? ?Co-evaluation PT/OT/SLP Co-Evaluation/Treatment: Yes ?Reason for Co-Treatment: Complexity of the patient's impairments (multi-system involvement);For patient/therapist safety;To address functional/ADL transfers ?PT goals addressed during session: Mobility/safety with mobility ?OT goals addressed during session: ADL's and self-care ?  ? ? ?  ?AM-PAC PT "6 Clicks" Mobility  ?Outcome Measure Help needed turning from your back to your side while in a flat bed without using bedrails?: Total ?Help needed moving from lying on your back to sitting on the side of a flat bed without using bedrails?: Total ?Help needed moving to and from a bed to a chair (including a wheelchair)?: Total ?Help needed standing  up from a chair using your arms (e.g., wheelchair or bedside chair)?: Total ?Help needed to walk in hospital room?: Total ?Help needed climbing 3-5 steps with a railing? : Total ?6 Click Score: 6 ? ?  ?End of Session   ?Activity Tolerance: Patient tolerated treatment well ?Patient left: in chair;with call bell/phone within reach;with chair alarm set ?Nurse  Communication: Mobility status (recommend use of hoyer lift for return to bed) ?PT Visit Diagnosis: Muscle weakness (generalized) (M62.81);Difficulty in walking, not elsewhere classified (R26.2) ?  ? ?Time: 091

## 2021-12-22 NOTE — Discharge Summary (Signed)
?Physician Discharge Summary ?  ?Patient: Brianna Woods MRN: [id number] DOB: 1935-06-22  ?Admit date:     12/21/2021  ?Discharge date: 12/22/21  ?Discharge Physician: Lorella Nimrod  ? ?PCP: Ria Bush, MD  ? ?Recommendations at discharge:  ?Patient is being discharged on Keflex, pending fire and urine culture results. ?Please follow-up on urine culture results and make changes to antibiotics if needed. ?Patient with elevated total cholesterol and LDL, goal LDL is less than 70 with her history of recurrent CVA, statin intolerance/allergies are listed in the chart, please evaluate to see if she can have PCSK9 inhibitor. ?Follow-up with primary care provider within a week ? ?Discharge Diagnoses: ?Principal Problem: ?  TIA (transient ischemic attack) ?Active Problems: ?  UTI (urinary tract infection) ?  HTN (hypertension) ?  Acquired hypothyroidism ?  Dementia ([place]) ?  Meningioma [facility]) ? ? ?Hospital Course: ?Taken from H&P. ? ?Brianna Woods is a 87 y.o. female with medical history significant for CVA, morbid obesity, GERD, hypertension and dyslipidemia ER via EMS for evaluation of change in mental status and a right-sided facial droop that had resolved by the time she arrived in the ER. ?Per patient she was not responding appropriately this morning and the nursing home staff were advised to send her to the ER for evaluation. ?During my evaluation she is oriented to person, place and time and complains about dysuria and frequency.  She also complains of a severe headache.  She denies having any fever or chills. ?She states that her oral intake has been poor but she denies having any abdominal pain, no nausea, no vomiting, no diarrhea or any changes in her bowel habits. ?She has no chest pain, no shortness of breath, no palpitations, no diaphoresis, no blurred vision or any focal deficit. ? ?ED course.  On presentation she was hemodynamically stable, afebrile.  Labs only positive for pyuria, UDS positive for tricyclic's,  TSH elevated at 8.8 with normal free T4. ?Chest x-ray with cardiomegaly, linear densities in the left lower lung field may suggest subsegmental atelectasis.  Left lateral CP angle is indistinct, possibly suggesting minimal effusion. ?CT head was negative for any acute infarct, did show prior extra-axial 2.2 cm mass along the inferior and anterior left frontal convexity, most likely meningioma, no mass effect/ ?MRI brain was also negative for any acute infarct, and they comment normal recent change in that meningioma noted as compared to the prior imaging done in 2020. ?MRA head and neck was negative for any large vessel occlusion.  There was a poor opacification noted at proximal left vertebral artery which was not specifically characterized but distant vertebral artery was patent. ?Lipid profile with total cholesterol of 273 and LDL of 204 with a goal less than 70. ?Patient was started on ceftriaxone and urine cultures are pending. ?Echocardiogram with normal EF, grade 2 diastolic dysfunction and no other significant abnormality. ? ?Patient will continue low-dose aspirin. ?She was not on any statin at home due to statin allergies. ?Patient should discussed with primary care provider and consider taking PCSK9 inhibitor therapy due to intolerance to statin and markedly elevated LDL. ? ?For concern of UTI she received ceftriaxone while in the hospital and sent back to facility with Keflex pending final urine culture results.  Skilled nursing facility or PCP should be able to follow-up to see if any change of antibiotics warranted. ? ?Patient is at her baseline and will continue current medications except the addition of low-dose aspirin and a 5-day course of antibiotics. ? ?She  will follow-up with her doctors as an outpatient for further recommendations. ? ? ?Assessment and Plan: ?* TIA (transient ischemic attack) ? ?Patient with a history of multiple TIAs as well as CVA in the past who presents for evaluation of  mental status changes and a right facial droop that has resolved. ?Initial CT scan of the head without contrast is negative for intracranial hemorrhage ?Follow-up results of MRI of the brain ?Continue aspirin.  Patient not on statins due to myalgia ?We will request PT/OT/ST consult ?We will hold off on consulting neurology unless the MRI shows an acute stroke. ? ?UTI (urinary tract infection) ?Patient noted to have pyuria and has a history of recurrent UTI ?We will place patient empirically on Rocephin 1 g IV daily ?Follow-up results of urine culture ? ?HTN (hypertension) ?Blood pressure is stable ? ?Acquired hypothyroidism ?Continue Synthroid ? ?Meningioma ([place]) ?Patient noted to have mildly increased size of an extra-axial 2.2 cm mass along the inferior and anterior left frontal convexity.  ?This probably represents a meningioma and there is no significant mass effect, but recommend MRI with contrast to better characterize and to confirm. ?Follow-up results of MRI of the brain ? ?Dementia ([place]) ?Continue Namenda ? ? ?Consultants: None ?Procedures performed: None ?Disposition: Skilled nursing facility ?Diet recommendation:  ?Discharge Diet Orders (From admission, onward)  ? ?  Start     Ordered  ? 12/22/21 0000  Diet - low sodium heart healthy       ? 12/22/21 1032  ? ?  ?  ? ?  ? ?Cardiac diet ?DISCHARGE MEDICATION: ?Allergies as of 12/22/2021   ? ?   Reactions  ? Ivp Dye [iodinated Contrast Media]   ? Turns red; BP and HR go up  ? Penicillins   ? "Sends me in left field"  ? Statins Other (See Comments)  ? Body aches (lipitor, simvastatin, pravastatin)  ? ?  ? ?  ?Medication List  ?  ? ?TAKE these medications   ? ?acetaminophen 500 MG tablet ?Commonly known as: TYLENOL ?Take 1,000 mg by mouth 3 (three) times daily. ?  ?albuterol 108 (90 Base) MCG/ACT inhaler ?Commonly known as: VENTOLIN HFA ?Inhale 1 puff into the lungs every 4 (four) hours as needed for wheezing or shortness of breath. ?  ?alum & mag  hydroxide-simeth 229-798-92 MG/5ML suspension ?Commonly known as: MAALOX PLUS ?Take 30 mLs by mouth every 6 (six) hours as needed for indigestion. ?  ?amitriptyline 10 MG tablet ?Commonly known as: ELAVIL ?Take 10 mg by mouth at bedtime. (Take with 25 mg tablet to total of 35 mg) ?  ?amitriptyline 25 MG tablet ?Commonly known as: ELAVIL ?Take 25 mg by mouth at bedtime. (Take with 10 mg tablet to total of 35 mg) ?  ?aspirin 81 MG chewable tablet ?Chew 1 tablet (81 mg total) by mouth daily. ?Start taking on: December 23, 2021 ?  ?cephALEXin 500 MG capsule ?Commonly known as: KEFLEX ?Take 1 capsule (500 mg total) by mouth 3 (three) times daily for 5 days. ?  ?Cranberry 400 MG Caps ?Take 400 mg by mouth daily. ?  ?diclofenac Sodium 1 % Gel ?Commonly known as: VOLTAREN ?Apply 2 g topically 2 (two) times daily. ?  ?diphenhydrAMINE-PE-APAP 6.25-2.5-160 MG/5ML Liqd ?Take 10 mLs by mouth every 6 (six) hours as needed (cold symptoms). ?  ?divalproex 125 MG capsule ?Commonly known as: DEPAKOTE SPRINKLE ?Take 250 mg by mouth 2 (two) times daily. ?  ?levothyroxine 75 MCG tablet ?Commonly known as: SYNTHROID ?Take  75 mcg by mouth daily. ?  ?memantine 5 MG tablet ?Commonly known as: NAMENDA ?Take 5 mg by mouth 2 (two) times daily. ?  ?multivitamin with minerals Tabs tablet ?Take 1 tablet by mouth daily. ?  ?omeprazole 20 MG capsule ?Commonly known as: PRILOSEC ?Take 20 mg by mouth daily. ?  ?pregabalin 200 MG capsule ?Commonly known as: LYRICA ?Take 200 mg by mouth 2 (two) times daily. ?  ?sennosides-docusate sodium 8.6-50 MG tablet ?Commonly known as: SENOKOT-S ?Take 1 tablet by mouth 2 (two) times daily. ?  ?traMADol 50 MG tablet ?Commonly known as: ULTRAM ?Take 50 mg by mouth 3 (three) times daily. ?  ?Vitamin D3 25 MCG (1000 UT) Caps ?Take 2,000 Units by mouth daily. ?  ? ?  ? ? Follow-up Information   ? ? Ria Bush, MD. Schedule an appointment as soon as possible for a visit in 1 week(s).   ?Specialty: Family  Medicine ?Contact information: ?[address] ?[place] [address] ?[phone] ? ? ?  ?  ? ?  ?  ? ?  ? ?Discharge Exam: ?Filed Weights  ? 12/21/21 1739 12/21/21 1900  ?Weight: 65 kg 65 kg  ? ?General.  Morbidly obes

## 2021-12-23 LAB — URINE CULTURE: Culture: >=100000 — AB

## 2022-10-21 ENCOUNTER — Emergency Department: Payer: Medicare HMO

## 2022-10-21 ENCOUNTER — Other Ambulatory Visit: Payer: Self-pay

## 2022-10-21 ENCOUNTER — Emergency Department
Admission: EM | Admit: 2022-10-21 | Discharge: 2022-10-21 | Disposition: A | Discharge status: Home / Self Care | Payer: Medicare HMO | Attending: Emergency Medicine | Admitting: Emergency Medicine

## 2022-10-21 DIAGNOSIS — R079 Chest pain, unspecified: Secondary | ICD-10-CM | POA: Diagnosis present

## 2022-10-21 DIAGNOSIS — J411 Mucopurulent chronic bronchitis: Secondary | ICD-10-CM | POA: Insufficient documentation

## 2022-10-21 LAB — BASIC METABOLIC PANEL
Anion gap: 8 (ref 5–15)
BUN: 17 mg/dL (ref 8–23)
CO2: 28 mmol/L (ref 22–32)
Calcium: 9 mg/dL (ref 8.9–10.3)
Chloride: 99 mmol/L (ref 98–111)
Creatinine, Ser: 0.85 mg/dL (ref 0.44–1.00)
GFR, Estimated: >60 mL/min (ref >60)
Glucose, Bld: 115 mg/dL — ABNORMAL HIGH (ref 70–99)
Potassium: 3.5 mmol/L (ref 3.5–5.1)
Sodium: 135 mmol/L (ref 135–145)

## 2022-10-21 LAB — CBC
HCT: 39.1 % (ref 36.0–46.0)
Hemoglobin: 12.2 g/dL (ref 12.0–15.0)
MCH: 28.8 pg (ref 26.0–34.0)
MCHC: 31.2 g/dL (ref 30.0–36.0)
MCV: 92.2 fL (ref 80.0–100.0)
Platelets: 191 10*3/uL (ref 150–400)
RBC: 4.24 MIL/uL (ref 3.87–5.11)
RDW: 13.6 % (ref 11.5–15.5)
WBC: 8.2 10*3/uL (ref 4.0–10.5)
nRBC: 0 % (ref 0.0–0.2)

## 2022-10-21 LAB — TROPONIN I (HIGH SENSITIVITY): Troponin I (High Sensitivity): 4 ng/L (ref <18)

## 2022-10-21 MED ORDER — DOXYCYCLINE HYCLATE 50 MG PO CAPS: 100.0000 mg | ORAL_CAPSULE | Freq: Two times a day (BID) | ORAL | 0 refills | Status: AC

## 2022-10-21 MED ORDER — PREDNISONE 10 MG (21) PO TBPK: ORAL_TABLET | ORAL | 0 refills | Status: DC

## 2022-10-21 NOTE — ED Notes (Signed)
[facility] to trasport pt to [facility]

## 2022-10-21 NOTE — ED Notes (Signed)
Report given to [facility] at [facility]

## 2022-10-21 NOTE — ED Triage Notes (Signed)
Pt presents to ER via EMS from [facility] with chest pain for the past 2 days, worse tonight, Pt received 3 nitro in facility, EMS administered 2 nitro and 4 baby asa (366m)  CP improved. Denies any shortness of breath at present. Pt is awake, alert and oriented.  Per EMS:  CBG 145 90% RA

## 2022-10-21 NOTE — ED Provider Notes (Signed)
[facility] Provider Note   Event Date/Time   First MD Initiated Contact with Patient 10/21/22 [phone]     (approximate) History  Chest Pain  HPI Brianna Woods is a 88 y.o. female with stated past medical history of chronic bronchitis who presents from [facility] via EMS with complaints of chest pain over the last 1 week.  Patient states that she was given 3 nitro in her facility and 4 baby aspirin prior to chest pain resolving.  Patient states that she has had pain similar to this in the past due to her chronic bronchitis and chronic cough.  Patient states that she has had productive cough of dark brown sputum over the last week. ROS: Patient currently denies any vision changes, tinnitus, difficulty speaking, facial droop, sore throat, shortness of breath, abdominal pain, nausea/vomiting/diarrhea, dysuria, or weakness/numbness/paresthesias in any extremity   Physical Exam  Triage Vital Signs: ED Triage Vitals  Enc Vitals Group     BP 10/21/22 0042 139/76     Pulse Rate 10/21/22 0042 90     Resp 10/21/22 0042 16     Temp 10/21/22 0042 98.6 F (37 C)     Temp Source 10/21/22 0042 Oral     SpO2 10/21/22 0042 95 %     Weight 10/21/22 0045 220 lb (99.8 kg)     Height 10/21/22 0045 5' (1.524 m)     Head Circumference --      [facility] Flow --      Pain Score 10/21/22 0043 8     Pain Loc --      Pain Edu? --      Excl. in [place]? --    Most recent vital signs: Vitals:   10/21/22 0105 10/21/22 0130  BP:  132/82  Pulse:  89  Resp:  14  Temp: 98.1 F (36.7 C)   SpO2:  94%   General: Awake, oriented x4. CV:  Good peripheral perfusion.  Resp:  Normal effort.  Abd:  No distention.  Other:  Elderly obese African-American female laying in bed in no acute distress ED Results / Procedures / Treatments  Labs (all labs ordered are listed, but only abnormal results are displayed) Labs Reviewed  BASIC METABOLIC PANEL - Abnormal; Notable for the following components:       Result Value   Glucose, Bld 115 (*)    All other components within normal limits  CBC  TROPONIN I (HIGH SENSITIVITY)   EKG ED ECG REPORT I, Naaman Plummer, the attending physician, personally viewed and interpreted this ECG. Date: 10/21/2022 EKG Time: 0043 Rate: 91 Rhythm: normal sinus rhythm QRS Axis: normal Intervals: Right bundle branch block ST/T Wave abnormalities: normal Narrative Interpretation: Normal sinus rhythm with first-degree AV block and right bundle branch block.  No evidence of acute ischemia RADIOLOGY ED MD interpretation: One-view portable chest x-ray interpreted by me shows no evidence of acute abnormalities including no pneumonia, pneumothorax, or widened mediastinum -Agree with radiology assessment Official radiology report(s): DG Chest Port 1 View  Result Date: 10/21/2022 CLINICAL DATA:  [id number] Chest pain [id number] EXAM: PORTABLE CHEST 1 VIEW COMPARISON:  Chest x-ray 12/21/2021, chest x-ray 11/22/2019 FINDINGS: The heart and mediastinal contours are unchanged. No focal consolidation. No pulmonary edema. No pleural effusion. No pneumothorax. No acute osseous abnormality. IMPRESSION: No active disease. Electronically Signed   By: Iven Finn M.D.   On: 10/21/2022 01:24   PROCEDURES: Critical Care performed: No Procedures MEDICATIONS ORDERED IN ED: Medications -  No data to display IMPRESSION / MDM / [place] / ED COURSE  I reviewed the triage vital signs and the nursing notes.                             The patient is on the cardiac monitor to evaluate for evidence of arrhythmia and/or significant heart rate changes. Patient's presentation is most consistent with acute presentation with potential threat to life or bodily function. 88 year old female presents via EMS from her long-term care facility with complaints of chest pain over the last week setting of cough related to chronic bronchitis. Workup: ECG, CXR, CBC, BMP, Troponin Findings: ECG:  No overt evidence of STEMI. No evidence of Brugadas sign, delta wave, epsilon wave, significantly prolonged QTc, or malignant arrhythmia HS Troponin: Negative x1 Other Labs unremarkable for emergent problems. CXR: Without PTX, PNA, or widened mediastinum Last Stress Test:  never Last Heart Catheterization:  never HEART Score: 4  Given History, Exam, and Workup I have low suspicion for ACS, Pneumothorax, Pneumonia, Pulmonary Embolus, Tamponade, Aortic Dissection or other emergent problem as a cause for this presentation.   Reassesment: Prior to discharge patients pain was controlled and they were well appearing.  Disposition:  Discharge. Strict return precautions discussed with patient with full understanding. Advised patient to follow up promptly with primary care provider    FINAL CLINICAL IMPRESSION(S) / ED DIAGNOSES   Final diagnoses:  Chronic bronchitis with productive mucopurulent cough (HCC)  Chest pain, unspecified type   Rx / DC Orders   ED Discharge Orders          Ordered    predniSONE (STERAPRED UNI-PAK 21 TAB) 10 MG (21) TBPK tablet        10/21/22 0143    doxycycline (VIBRAMYCIN) 50 MG capsule  2 times daily        10/21/22 0143           Note:  This document was prepared using Dragon voice recognition software and may include unintentional dictation errors.   Naaman Plummer, MD 10/21/22 [id number]

## 2023-01-30 ENCOUNTER — Emergency Department: Payer: Medicare HMO

## 2023-01-30 ENCOUNTER — Other Ambulatory Visit: Payer: Self-pay

## 2023-01-30 ENCOUNTER — Inpatient Hospital Stay
Admission: EM | Admit: 2023-01-30 | Discharge: 2023-02-05 | DRG: 682 | Disposition: A | Discharge status: Skilled Nursing Facility | Payer: Medicare HMO | Source: Skilled Nursing Facility | Attending: Family Medicine | Admitting: Family Medicine

## 2023-01-30 DIAGNOSIS — D329 Benign neoplasm of meninges, unspecified: Secondary | ICD-10-CM | POA: Diagnosis present

## 2023-01-30 DIAGNOSIS — Z6841 Body Mass Index (BMI) 40.0 and over, adult: Secondary | ICD-10-CM

## 2023-01-30 DIAGNOSIS — M797 Fibromyalgia: Secondary | ICD-10-CM | POA: Diagnosis present

## 2023-01-30 DIAGNOSIS — E876 Hypokalemia: Secondary | ICD-10-CM | POA: Diagnosis not present

## 2023-01-30 DIAGNOSIS — R1310 Dysphagia, unspecified: Secondary | ICD-10-CM | POA: Diagnosis present

## 2023-01-30 DIAGNOSIS — E039 Hypothyroidism, unspecified: Secondary | ICD-10-CM | POA: Diagnosis present

## 2023-01-30 DIAGNOSIS — N179 Acute kidney failure, unspecified: Secondary | ICD-10-CM | POA: Diagnosis not present

## 2023-01-30 DIAGNOSIS — Z83438 Family history of other disorder of lipoprotein metabolism and other lipidemia: Secondary | ICD-10-CM

## 2023-01-30 DIAGNOSIS — Z91041 Radiographic dye allergy status: Secondary | ICD-10-CM

## 2023-01-30 DIAGNOSIS — R4182 Altered mental status, unspecified: Secondary | ICD-10-CM

## 2023-01-30 DIAGNOSIS — Z8249 Family history of ischemic heart disease and other diseases of the circulatory system: Secondary | ICD-10-CM

## 2023-01-30 DIAGNOSIS — Z79890 Hormone replacement therapy (postmenopausal): Secondary | ICD-10-CM

## 2023-01-30 DIAGNOSIS — I2510 Atherosclerotic heart disease of native coronary artery without angina pectoris: Secondary | ICD-10-CM | POA: Diagnosis present

## 2023-01-30 DIAGNOSIS — J45909 Unspecified asthma, uncomplicated: Secondary | ICD-10-CM | POA: Diagnosis present

## 2023-01-30 DIAGNOSIS — Z79891 Long term (current) use of opiate analgesic: Secondary | ICD-10-CM

## 2023-01-30 DIAGNOSIS — K2270 Barrett's esophagus without dysplasia: Secondary | ICD-10-CM | POA: Diagnosis present

## 2023-01-30 DIAGNOSIS — G459 Transient cerebral ischemic attack, unspecified: Secondary | ICD-10-CM | POA: Diagnosis present

## 2023-01-30 DIAGNOSIS — Z79899 Other long term (current) drug therapy: Secondary | ICD-10-CM

## 2023-01-30 DIAGNOSIS — Z7982 Long term (current) use of aspirin: Secondary | ICD-10-CM

## 2023-01-30 DIAGNOSIS — Z888 Allergy status to other drugs, medicaments and biological substances status: Secondary | ICD-10-CM

## 2023-01-30 DIAGNOSIS — Z823 Family history of stroke: Secondary | ICD-10-CM

## 2023-01-30 DIAGNOSIS — D649 Anemia, unspecified: Secondary | ICD-10-CM | POA: Diagnosis present

## 2023-01-30 DIAGNOSIS — Z9049 Acquired absence of other specified parts of digestive tract: Secondary | ICD-10-CM

## 2023-01-30 DIAGNOSIS — E785 Hyperlipidemia, unspecified: Secondary | ICD-10-CM | POA: Diagnosis present

## 2023-01-30 DIAGNOSIS — Z880 Allergy status to penicillin: Secondary | ICD-10-CM

## 2023-01-30 DIAGNOSIS — I119 Hypertensive heart disease without heart failure: Secondary | ICD-10-CM | POA: Diagnosis present

## 2023-01-30 DIAGNOSIS — Z87442 Personal history of urinary calculi: Secondary | ICD-10-CM

## 2023-01-30 DIAGNOSIS — K219 Gastro-esophageal reflux disease without esophagitis: Secondary | ICD-10-CM | POA: Diagnosis present

## 2023-01-30 DIAGNOSIS — Z8673 Personal history of transient ischemic attack (TIA), and cerebral infarction without residual deficits: Secondary | ICD-10-CM

## 2023-01-30 DIAGNOSIS — G9341 Metabolic encephalopathy: Secondary | ICD-10-CM | POA: Diagnosis present

## 2023-01-30 DIAGNOSIS — Z833 Family history of diabetes mellitus: Secondary | ICD-10-CM

## 2023-01-30 DIAGNOSIS — E860 Dehydration: Secondary | ICD-10-CM | POA: Diagnosis present

## 2023-01-30 DIAGNOSIS — I10 Essential (primary) hypertension: Secondary | ICD-10-CM | POA: Diagnosis present

## 2023-01-30 DIAGNOSIS — F0390 Unspecified dementia without behavioral disturbance: Secondary | ICD-10-CM | POA: Diagnosis present

## 2023-01-30 DIAGNOSIS — N390 Urinary tract infection, site not specified: Secondary | ICD-10-CM | POA: Diagnosis present

## 2023-01-30 LAB — TROPONIN I (HIGH SENSITIVITY)
Troponin I (High Sensitivity): 8 ng/L (ref <18)
Troponin I (High Sensitivity): 6 ng/L (ref <18)

## 2023-01-30 LAB — COMPREHENSIVE METABOLIC PANEL
ALT: 17 U/L (ref 0–44)
AST: 22 U/L (ref 15–41)
Albumin: 3.6 g/dL (ref 3.5–5.0)
Alkaline Phosphatase: 63 U/L (ref 38–126)
Anion gap: 10 (ref 5–15)
BUN: 17 mg/dL (ref 8–23)
CO2: 28 mmol/L (ref 22–32)
Calcium: 9.1 mg/dL (ref 8.9–10.3)
Chloride: 100 mmol/L (ref 98–111)
Creatinine, Ser: 1.67 mg/dL — ABNORMAL HIGH (ref 0.44–1.00)
GFR, Estimated: 30 mL/min — ABNORMAL LOW (ref >60)
Glucose, Bld: 98 mg/dL (ref 70–99)
Potassium: 3.8 mmol/L (ref 3.5–5.1)
Sodium: 138 mmol/L (ref 135–145)
Total Bilirubin: 0.6 mg/dL (ref 0.3–1.2)
Total Protein: 7.1 g/dL (ref 6.5–8.1)

## 2023-01-30 LAB — CBC WITH DIFFERENTIAL/PLATELET
Abs Immature Granulocytes: 0.02 10*3/uL (ref 0.00–0.07)
Basophils Absolute: 0 10*3/uL (ref 0.0–0.1)
Basophils Relative: 0 %
Eosinophils Absolute: 0.1 10*3/uL (ref 0.0–0.5)
Eosinophils Relative: 1 %
HCT: 39.9 % (ref 36.0–46.0)
Hemoglobin: 12.6 g/dL (ref 12.0–15.0)
Immature Granulocytes: 0 %
Lymphocytes Relative: 31 %
Lymphs Abs: 3.2 10*3/uL (ref 0.7–4.0)
MCH: 29.4 pg (ref 26.0–34.0)
MCHC: 31.6 g/dL (ref 30.0–36.0)
MCV: 93.2 fL (ref 80.0–100.0)
Monocytes Absolute: 1.1 10*3/uL — ABNORMAL HIGH (ref 0.1–1.0)
Monocytes Relative: 11 %
Neutro Abs: 5.7 10*3/uL (ref 1.7–7.7)
Neutrophils Relative %: 57 %
Platelets: 210 10*3/uL (ref 150–400)
RBC: 4.28 MIL/uL (ref 3.87–5.11)
RDW: 14 % (ref 11.5–15.5)
WBC: 10.1 10*3/uL (ref 4.0–10.5)
nRBC: 0 % (ref 0.0–0.2)

## 2023-01-30 LAB — LACTIC ACID, PLASMA
Lactic Acid, Venous: 1.2 mmol/L (ref 0.5–1.9)
Lactic Acid, Venous: 1.3 mmol/L (ref 0.5–1.9)

## 2023-01-30 LAB — BRAIN NATRIURETIC PEPTIDE: B Natriuretic Peptide: 20.2 pg/mL (ref 0.0–100.0)

## 2023-01-30 MED ORDER — SODIUM CHLORIDE 0.9 % IV BOLUS: 1000.0000 mL | Freq: Once | INTRAVENOUS | Status: DC

## 2023-01-30 MED ORDER — SODIUM CHLORIDE 0.9 % IV BOLUS
500.0000 mL | Freq: Once | INTRAVENOUS | Status: AC
  Administered 2023-01-30: 500 mL via INTRAVENOUS

## 2023-01-30 NOTE — ED Provider Notes (Signed)
[name] Provider Note    Event Date/Time   First MD Initiated Contact with Patient 01/30/23 2015     (approximate)   History   No chief complaint on file.   HPI  Brianna Woods is a 88 y.o. female with a history of CVA, GERD, hypertension, morbid obesity, dyslipidemia who presents with altered mental status; per EMS facility staff describes increased lethargy since yesterday.  The patient herself is unable to give any history.  I reviewed the past medical records.  The patient was admitted in April 2023 after presenting with altered mental status and transient right facial droop.  She was diagnosed with TIA and UTI.  She was seen in the ED on 2/10 for chest pain.   Physical Exam   Triage Vital Signs: ED Triage Vitals  Enc Vitals Group     BP      Pulse      Resp      Temp      Temp src      SpO2      Weight      Height      Head Circumference      Pe[facility] Flow      Pain Score      Pain Loc      Pain Edu?      Excl. in [place]?     Most recent vital signs: Vitals:   01/30/23 2200 01/30/23 2300  BP: 121/80 94/65  Pulse: 78 79  Resp: 12 (!) 9  Temp:    SpO2: 96% 94%     General: Alert, oriented x 1, weak appearing. CV:  Good peripheral perfusion.  Resp:  Normal effort.  Somewhat diminished breath sounds bilaterally.  No wheezes or rales. Abd:  Soft and nontender.  No distention.  Other:  Slightly dry mucous membranes.  EOMI.  PERRLA.  Motor intact in all extremities.   ED Results / Procedures / Treatments   Labs (all labs ordered are listed, but only abnormal results are displayed) Labs Reviewed  COMPREHENSIVE METABOLIC PANEL - Abnormal; Notable for the following components:      Result Value   Creatinine, Ser 1.67 (*)    GFR, Estimated 30 (*)    All other components within normal limits  CBC WITH DIFFERENTIAL/PLATELET - Abnormal; Notable for the following components:   Monocytes Absolute 1.1 (*)    All other components within  normal limits  LACTIC ACID, PLASMA  LACTIC ACID, PLASMA  BRAIN NATRIURETIC PEPTIDE  URINALYSIS, ROUTINE W REFLEX MICROSCOPIC  TROPONIN I (HIGH SENSITIVITY)  TROPONIN I (HIGH SENSITIVITY)     EKG  ED ECG REPORT I, Dionne Bucy, the attending physician, personally viewed and interpreted this ECG.  Date: 01/30/2023 EKG Time: 2012 Rate: 79 Rhythm: normal sinus rhythm QRS Axis: normal Intervals: RBBB  ST/T Wave abnormalities: Nonspecific ST abnormalities Narrative Interpretation: no evidence of acute ischemia    RADIOLOGY  Chest x-ray: I independently viewed and interpreted the images; there is mild vascular congestion with no focal consolidation or edema  CT head: No ICH or other acute abnormality   PROCEDURES:  Critical Care performed: No  Procedures   MEDICATIONS ORDERED IN ED: Medications  sodium chloride 0.9 % bolus 500 mL (has no administration in time range)     IMPRESSION / MDM / ASSESSMENT AND PLAN / ED COURSE  I reviewed the triage vital signs and the nursing notes.  88 year old female with PMH as noted above presents with  increased lethargy since yesterday.  There are no other focal symptoms.  She was noted by EMS to be hypoxic to the 80s on room air and was put on O2, although now she is in the high 90s on room air.  EMS EKG also showed possible ST elevations although EKG here does not show findings meeting STEMI criteria.  On exam the patient is somewhat weak and lethargic appearing, oriented x 1, but with no other focal exam findings.  Differential diagnosis includes, but is not limited to, UTI, other infection, dehydration, electrolyte abnormality, other metabolic disturbance, CVA or other CNS cause, cardiac etiology, medication side effect.  We will obtain CT head, chest x-ray, lab workup, and reassess.  Patient's presentation is most consistent with acute presentation with potential threat to life or bodily function.  The patient is on the  cardiac monitor to evaluate for evidence of arrhythmia and/or significant heart rate changes.  ----------------------------------------- 11:14 PM on 01/30/2023 -----------------------------------------  CT head is negative for acute findings.  Chest x-ray also does not show any evidence of edema or consolidation.  Lab workup is unremarkable so far.  Troponin is negative.  BNP is negative.  Lactate is normal.  Electrolytes are normal.  There is no leukocytosis or anemia.  On reassessment the patient is much more alert and is able to answer questions although still mildly confused.  She believes that she is in [place].  I called her son Brianna Woods who states that this is pretty much her baseline mental status and that she has some dementia.  The etiology of the hypoxia earlier is unclear.  The urinalysis is still pending.  He states he she has had frequent UTIs in the past, and this could likely have explain her lethargy earlier.  We will give some fluids, obtain urinalysis, and reassess.  However I anticipate she may be appropriate for discharge back to her facility.  I have signed the patient out to the oncoming ED physician Dr. Sidney Ace.   FINAL CLINICAL IMPRESSION(S) / ED DIAGNOSES   Final diagnoses:  Altered mental status, unspecified altered mental status type     Rx / DC Orders   ED Discharge Orders     None        Note:  This document was prepared using Dragon voice recognition software and may include unintentional dictation errors.    Dionne Bucy, MD 01/30/23 2317

## 2023-01-30 NOTE — ED Notes (Signed)
Portable XR at bedside

## 2023-01-30 NOTE — ED Notes (Signed)
Pt changed down from arrival clothing. Found covered in spaghetti. Brief clean and dry. Attempted sterile in and out catheter for urine collection with no obtainable specimen.   Will attempt again.

## 2023-01-30 NOTE — ED Triage Notes (Signed)
Arrives A-EMS from "[facility]" for lethargic behaviors since yesterday. Upon EMS arrival pt found to be 84% on room air.   Hx of heart failure, alzheimer's and shows recent admission for urinary tract infection.

## 2023-01-30 NOTE — ED Notes (Signed)
Lab states insufficient quantity of urine collection after second collection attempt.

## 2023-01-30 NOTE — ED Notes (Signed)
Assisted by second RN in urine collection via in-and-out catheter.   Pt cleaned and linens replaced. New brief placed and covered with warm blankets.

## 2023-01-31 ENCOUNTER — Emergency Department: Payer: Medicare HMO

## 2023-01-31 ENCOUNTER — Inpatient Hospital Stay: Payer: Medicare HMO

## 2023-01-31 ENCOUNTER — Encounter: Payer: Self-pay | Admitting: Family Medicine

## 2023-01-31 DIAGNOSIS — Z6841 Body Mass Index (BMI) 40.0 and over, adult: Secondary | ICD-10-CM | POA: Diagnosis not present

## 2023-01-31 DIAGNOSIS — K219 Gastro-esophageal reflux disease without esophagitis: Secondary | ICD-10-CM | POA: Diagnosis present

## 2023-01-31 DIAGNOSIS — I119 Hypertensive heart disease without heart failure: Secondary | ICD-10-CM | POA: Diagnosis present

## 2023-01-31 DIAGNOSIS — Z86018 Personal history of other benign neoplasm: Secondary | ICD-10-CM

## 2023-01-31 DIAGNOSIS — M797 Fibromyalgia: Secondary | ICD-10-CM | POA: Diagnosis present

## 2023-01-31 DIAGNOSIS — F0390 Unspecified dementia without behavioral disturbance: Secondary | ICD-10-CM

## 2023-01-31 DIAGNOSIS — E860 Dehydration: Secondary | ICD-10-CM | POA: Diagnosis present

## 2023-01-31 DIAGNOSIS — Z7982 Long term (current) use of aspirin: Secondary | ICD-10-CM | POA: Diagnosis not present

## 2023-01-31 DIAGNOSIS — R1310 Dysphagia, unspecified: Secondary | ICD-10-CM | POA: Diagnosis present

## 2023-01-31 DIAGNOSIS — R4182 Altered mental status, unspecified: Secondary | ICD-10-CM | POA: Diagnosis present

## 2023-01-31 DIAGNOSIS — J45909 Unspecified asthma, uncomplicated: Secondary | ICD-10-CM | POA: Diagnosis present

## 2023-01-31 DIAGNOSIS — I10 Essential (primary) hypertension: Secondary | ICD-10-CM | POA: Diagnosis not present

## 2023-01-31 DIAGNOSIS — I2510 Atherosclerotic heart disease of native coronary artery without angina pectoris: Secondary | ICD-10-CM

## 2023-01-31 DIAGNOSIS — E039 Hypothyroidism, unspecified: Secondary | ICD-10-CM | POA: Diagnosis present

## 2023-01-31 DIAGNOSIS — N390 Urinary tract infection, site not specified: Secondary | ICD-10-CM | POA: Diagnosis not present

## 2023-01-31 DIAGNOSIS — D649 Anemia, unspecified: Secondary | ICD-10-CM | POA: Diagnosis present

## 2023-01-31 DIAGNOSIS — Z8673 Personal history of transient ischemic attack (TIA), and cerebral infarction without residual deficits: Secondary | ICD-10-CM

## 2023-01-31 DIAGNOSIS — N179 Acute kidney failure, unspecified: Secondary | ICD-10-CM | POA: Diagnosis not present

## 2023-01-31 DIAGNOSIS — E876 Hypokalemia: Secondary | ICD-10-CM | POA: Diagnosis not present

## 2023-01-31 DIAGNOSIS — G459 Transient cerebral ischemic attack, unspecified: Secondary | ICD-10-CM | POA: Diagnosis not present

## 2023-01-31 DIAGNOSIS — K2100 Gastro-esophageal reflux disease with esophagitis, without bleeding: Secondary | ICD-10-CM | POA: Diagnosis not present

## 2023-01-31 DIAGNOSIS — Z79891 Long term (current) use of opiate analgesic: Secondary | ICD-10-CM | POA: Diagnosis not present

## 2023-01-31 DIAGNOSIS — Z79899 Other long term (current) drug therapy: Secondary | ICD-10-CM | POA: Diagnosis not present

## 2023-01-31 DIAGNOSIS — E785 Hyperlipidemia, unspecified: Secondary | ICD-10-CM | POA: Diagnosis present

## 2023-01-31 DIAGNOSIS — Z79890 Hormone replacement therapy (postmenopausal): Secondary | ICD-10-CM | POA: Diagnosis not present

## 2023-01-31 DIAGNOSIS — D329 Benign neoplasm of meninges, unspecified: Secondary | ICD-10-CM | POA: Diagnosis present

## 2023-01-31 DIAGNOSIS — K2270 Barrett's esophagus without dysplasia: Secondary | ICD-10-CM | POA: Diagnosis present

## 2023-01-31 DIAGNOSIS — G9341 Metabolic encephalopathy: Secondary | ICD-10-CM | POA: Diagnosis present

## 2023-01-31 LAB — URINALYSIS, ROUTINE W REFLEX MICROSCOPIC
Bacteria, UA: NONE SEEN
Bilirubin Urine: NEGATIVE
Glucose, UA: NEGATIVE mg/dL
Ketones, ur: NEGATIVE mg/dL
Nitrite: NEGATIVE
Protein, ur: 30 mg/dL — AB
RBC / HPF: >50 RBC/hpf (ref 0–5)
Specific Gravity, Urine: 1.019 (ref 1.005–1.030)
pH: 6 (ref 5.0–8.0)

## 2023-01-31 LAB — CREATININE, URINE, RANDOM: Creatinine, Urine: 232 mg/dL

## 2023-01-31 LAB — SODIUM, URINE, RANDOM: Sodium, Ur: 84 mmol/L

## 2023-01-31 MED ORDER — AMITRIPTYLINE HCL 25 MG PO TABS
25.0000 mg | ORAL_TABLET | Freq: Every day | ORAL | Status: DC
  Administered 2023-01-31 – 2023-02-04 (×5): 25 mg via ORAL
  Filled 2023-01-31 (×5): qty 1

## 2023-01-31 MED ORDER — ONDANSETRON HCL 4 MG PO TABS: 4.0000 mg | ORAL_TABLET | Freq: Four times a day (QID) | ORAL | Status: DC | PRN

## 2023-01-31 MED ORDER — DIVALPROEX SODIUM 125 MG PO CSDR
250.0000 mg | DELAYED_RELEASE_CAPSULE | Freq: Two times a day (BID) | ORAL | Status: DC
  Administered 2023-01-31 – 2023-02-05 (×11): 250 mg via ORAL
  Filled 2023-01-31 (×11): qty 2

## 2023-01-31 MED ORDER — MEMANTINE HCL 5 MG PO TABS
5.0000 mg | ORAL_TABLET | Freq: Two times a day (BID) | ORAL | Status: DC
  Administered 2023-01-31 – 2023-02-05 (×11): 5 mg via ORAL
  Filled 2023-01-31 (×11): qty 1

## 2023-01-31 MED ORDER — ENOXAPARIN SODIUM 40 MG/0.4ML IJ SOSY
40.0000 mg | PREFILLED_SYRINGE | INTRAMUSCULAR | Status: DC
  Administered 2023-01-31 – 2023-02-05 (×6): 40 mg via SUBCUTANEOUS
  Filled 2023-01-31 (×6): qty 0.4

## 2023-01-31 MED ORDER — LACTATED RINGERS IV BOLUS
500.0000 mL | Freq: Once | INTRAVENOUS | Status: AC
  Administered 2023-01-31: 500 mL via INTRAVENOUS

## 2023-01-31 MED ORDER — ALBUTEROL SULFATE (2.5 MG/3ML) 0.083% IN NEBU: 3.0000 mL | INHALATION_SOLUTION | RESPIRATORY_TRACT | Status: DC | PRN

## 2023-01-31 MED ORDER — ASPIRIN 81 MG PO CHEW
81.0000 mg | CHEWABLE_TABLET | Freq: Every day | ORAL | Status: DC
  Administered 2023-01-31 – 2023-02-05 (×6): 81 mg via ORAL
  Filled 2023-01-31 (×6): qty 1

## 2023-01-31 MED ORDER — ONDANSETRON HCL 4 MG/2ML IJ SOLN
4.0000 mg | Freq: Four times a day (QID) | INTRAMUSCULAR | Status: DC | PRN
  Administered 2023-02-02: 4 mg via INTRAVENOUS
  Filled 2023-01-31: qty 2

## 2023-01-31 MED ORDER — SODIUM CHLORIDE 0.9 % IV SOLN: INTRAVENOUS | Status: DC

## 2023-01-31 MED ORDER — LEVOTHYROXINE SODIUM 50 MCG PO TABS
75.0000 ug | ORAL_TABLET | Freq: Every day | ORAL | Status: DC
  Administered 2023-02-01 – 2023-02-05 (×5): 75 ug via ORAL
  Filled 2023-01-31 (×5): qty 2

## 2023-01-31 MED ORDER — PANTOPRAZOLE SODIUM 40 MG PO TBEC
40.0000 mg | DELAYED_RELEASE_TABLET | Freq: Every day | ORAL | Status: DC
  Administered 2023-01-31 – 2023-02-05 (×6): 40 mg via ORAL
  Filled 2023-01-31 (×6): qty 1

## 2023-01-31 MED ORDER — SODIUM CHLORIDE 0.9 % IV SOLN
1.0000 g | Freq: Once | INTRAVENOUS | Status: AC
  Administered 2023-01-31: 1 g via INTRAVENOUS
  Filled 2023-01-31: qty 10

## 2023-01-31 MED ORDER — SODIUM CHLORIDE 0.9 % IV SOLN
1.0000 g | Freq: Once | INTRAVENOUS | Status: AC
  Administered 2023-02-01: 1 g via INTRAVENOUS
  Filled 2023-01-31: qty 10

## 2023-01-31 NOTE — ED Notes (Signed)
SLP at bedside.

## 2023-01-31 NOTE — Assessment & Plan Note (Signed)
Continue Synthroid °

## 2023-01-31 NOTE — Assessment & Plan Note (Signed)
BP stable Titrate home regimen 

## 2023-01-31 NOTE — Assessment & Plan Note (Signed)
Remote history of TIA in the past CT head stable today in setting of altered mental status presentation Reassess for further neuroimaging if symptoms persist despite treatment regimen noted above Follow

## 2023-01-31 NOTE — ED Notes (Signed)
In-and-out catheter for urine collection

## 2023-01-31 NOTE — ED Notes (Signed)
Pt difficult to keep focused and alert to drink water for swallow screen. RN advised MD Alvester Morin a speech eval would be beneficial for pt safety for med admin.

## 2023-01-31 NOTE — ED Notes (Signed)
Patient transported to CT 

## 2023-01-31 NOTE — Assessment & Plan Note (Signed)
Remote history of meningioma during admission April 2023 CT head stable Will monitor for now Reassess as clinically indicated

## 2023-01-31 NOTE — H&P (Addendum)
History and Physical    Patient: Brianna Woods ZOX:[id number] DOB: 06/16/1935 DOA: 01/30/2023 DOS: the patient was seen and examined on 01/31/2023 PCP: Pcp, No  Patient coming from: SNF  Chief Complaint:  Chief Complaint  Patient presents with   Altered Mental Status   HPI: Brianna Woods is a 88 y.o. female with medical history significant of TIA, dementia, hyperlipidemia, hypertension, depression, fibromyalgia, coronary artery disease presenting with AMS, UTI, AKI.  Limited history in the setting of altered mental status.  Per report, patient with increased lethargy at her facility yesterday.  No reports of fevers or chills.  No nausea or vomiting.  No reports of chest pain or cough.  No reports of urinary incontinence.  No reported recent medication changes.  Noted admission April 2023 for lethargy and right-sided facial droop concerning for TIA.  No reports of slurred speech or worsening weakness. Presented to the ER afebrile, hemodynamically stable.  Did have some transient blood pressures into the 80s which improved with IV fluid hydration.  White count 10.1, hemoglobin 12.6, platelets 210, urinalysis positive for leukocytes, troponin and BNP stable.  Chest x-ray and CT head grossly stable though with some cardiomegaly and vascular congestion on imaging. Review of Systems: unable to review all systems due to the inability of the patient to answer questions. Past Medical History:  Diagnosis Date   Anemia    Arthritis    Asthma    per pt   Atherosclerosis of abdominal aorta (HCC) 07/2015   by CT   Barrett's esophagus    on EGD 2008, EGD WNL 2013   CAD (coronary artery disease) 07/2015   of LAD by CT   Diverticulosis 2014   sigmoid on colonoscopy   Fatty pancreas 07/2015   by CT   Fibromyalgia    per pt, no records of this    GERD (gastroesophageal reflux disease)    and esoph stricture s/p dilation 2008 HH by CT 2016   Glaucoma    History of anxiety    was on prozac then effexor  (pt denies h/o anxiety/depression)   History of cardiac murmur    History of chicken pox    History of CVA (cerebrovascular accident) 2008   "I've had several TIAs"   History of right bundle branch block 2011   History of ulcer disease    per pt, no records of this   HLD (hyperlipidemia)    HTN (hypertension)    Kidney cyst, acquired 07/2015   by CT, rec rpt renal MRI in 6 months   Mixed incontinence    on ditropan   Nephrolithiasis    s/p surgery R kidney (9mm) 11/2009   Nodular goiter, toxic or with hyperthyroidism    h/o toxic, on methimazole, known large left thyroid nodule 08/2013 s/p beign biopsy per patient 2010, saw Dr. Tedd Sias, Rad I ablation 04/2014   Vitamin D deficiency    Past Surgical History:  Procedure Laterality Date   CATARACT EXTRACTION  1990   w/ implants   CHOLECYSTECTOMY  2006   COLONOSCOPY  10/02/12   diverticulosis, o/w WNL ([facility])   dexa     no records received   ESOPHAGOGASTRODUODENOSCOPY  11/2011   LA grade A esophagitis lower 1/3, dilated, med HH, o/w WNL - path: + GERD, no barrett's - f/u 11/2016   LITHOTRIPSY Right 2011   thyroid uptake scan  04/2014   unifrm uptake, enlarged thyroid consistent with grave's dz   Social History:  reports that she has  never smoked. She has never used smokeless tobacco. She reports that she does not drink alcohol and does not use drugs.  Allergies  Allergen Reactions   Ivp Dye [Iodinated Contrast Media]     Turns red; BP and HR go up   Penicillins     "Sends me in left field"   Statins Other (See Comments)    Body aches (lipitor, simvastatin, pravastatin)    Family History  Problem Relation Age of Onset   Hyperlipidemia Mother    Hypertension Mother    Stroke Father        hemorrhage   Aneurysm Father 5       deceased   Other Brother        TB   Coronary artery disease Brother    Aneurysm Son 52       sudden death   Diabetes Maternal Aunt    Cancer Brother 90       lung    Prior to Admission  medications   Medication Sig Start Date End Date Taking? Authorizing Provider  acetaminophen (TYLENOL) 500 MG tablet Take 1,000 mg by mouth 3 (three) times daily.   Yes [provider]  albuterol (VENTOLIN HFA) 108 (90 Base) MCG/ACT inhaler Inhale 1 puff into the lungs every 4 (four) hours as needed for wheezing or shortness of breath.   Yes [provider]  alum & mag hydroxide-simeth (MAALOX PLUS) 400-400-40 MG/5ML suspension Take 30 mLs by mouth every 6 (six) hours as needed for indigestion.   Yes [provider]  amitriptyline (ELAVIL) 25 MG tablet Take 25 mg by mouth at bedtime. (Take with 10 mg tablet to total of 35 mg) 12/06/21  Yes [provider]  aspirin 81 MG chewable tablet Chew 1 tablet (81 mg total) by mouth daily. 12/23/21  Yes Arnetha Courser, MD  Cholecalciferol (VITAMIN D3) 25 MCG (1000 UT) CAPS Take 2,000 Units by mouth daily. 11/29/21  Yes [provider]  Cranberry 400 MG CAPS Take 400 mg by mouth daily. 10/06/21  Yes [provider]  diclofenac Sodium (VOLTAREN) 1 % GEL Apply 2 g topically 2 (two) times daily.   Yes [provider]  diphenhydrAMINE-PE-APAP 6.25-2.5-160 MG/5ML LIQD Take 10 mLs by mouth every 6 (six) hours as needed (cold symptoms).   Yes [provider]  divalproex (DEPAKOTE SPRINKLE) 125 MG capsule Take 250 mg by mouth 2 (two) times daily.   Yes [provider]  ipratropium-albuterol (DUONEB) 0.5-2.5 (3) MG/3ML SOLN Take 3 mLs by nebulization every 6 (six) hours as needed.   Yes [provider]  latanoprost (XALATAN) 0.005 % ophthalmic solution Place 1 drop into both eyes daily.   Yes [provider]  levothyroxine (SYNTHROID) 75 MCG tablet Take 75 mcg by mouth daily. 12/21/21  Yes [provider]  Magnesium 200 MG TABS Take 1 tablet by mouth daily.   Yes [provider]  memantine (NAMENDA) 5 MG tablet Take 5 mg by mouth 2 (two) times daily. 12/13/21  Yes  [provider]  Multiple Vitamin (MULTIVITAMIN WITH MINERALS) TABS tablet Take 1 tablet by mouth daily.   Yes [provider]  omeprazole (PRILOSEC) 20 MG capsule Take 20 mg by mouth daily. 12/06/21  Yes [provider]  pregabalin (LYRICA) 200 MG capsule Take 200 mg by mouth 2 (two) times daily. 12/20/21  Yes [provider]  sennosides-docusate sodium (SENOKOT-S) 8.6-50 MG tablet Take 1 tablet by mouth 2 (two) times daily.  Yes [provider]  traMADol (ULTRAM) 50 MG tablet Take 50 mg by mouth 3 (three) times daily. 12/01/21  Yes [provider]  amitriptyline (ELAVIL) 10 MG tablet Take 10 mg by mouth at bedtime. (Take with 25 mg tablet to total of 35 mg) Patient not taking: Reported on 01/30/2023 12/06/21   [provider]  predniSONE (STERAPRED UNI-PAK 21 TAB) 10 MG (21) TBPK tablet As directed on packaging Patient not taking: Reported on 01/30/2023 10/21/22   Merwyn Katos, MD  pirbuterol (MAXAIR) 200 MCG/INH inhaler Inhale 2 puffs into the lungs 4 (four) times daily. 09/28/11 01/22/12  Eustaquio Boyden, MD    Physical Exam: Vitals:   01/31/23 0500 01/31/23 0600 01/31/23 0700 01/31/23 0800  BP: 95/60 111/75 117/61 126/75  Pulse: 67 62 (!) 56 63  Resp: (!) 21 (!) 8 18 15   Temp:    (!) 97.5 F (36.4 C)  TempSrc:    Axillary  SpO2: 91% 91% 94% 93%  Weight:      Height:       Physical Exam Constitutional:      General: She is not in acute distress.    Appearance: She is obese.  HENT:     Head: Normocephalic and atraumatic.     Nose: Nose normal.     Mouth/Throat:     Mouth: Mucous membranes are dry.  Eyes:     Pupils: Pupils are equal, round, and reactive to light.  Cardiovascular:     Rate and Rhythm: Normal rate and regular rhythm.  Abdominal:     General: Abdomen is flat. Bowel sounds are normal.  Musculoskeletal:        General: Normal range of motion.     Cervical back: Normal range of motion.  Skin:     General: Skin is dry.  Neurological:     General: No focal deficit present.  Psychiatric:        Mood and Affect: Mood normal.     Data Reviewed:  There are no new results to review at this time. CT Renal Stone Study CLINICAL DATA:  Abdominal/flank pain, stone suspected  EXAM: CT ABDOMEN AND PELVIS WITHOUT CONTRAST  TECHNIQUE: Multidetector CT imaging of the abdomen and pelvis was performed following the standard protocol without IV contrast.  RADIATION DOSE REDUCTION: This exam was performed according to the departmental dose-optimization program which includes automated exposure control, adjustment of the mA and/or kV according to patient size and/or use of iterative reconstruction technique.  COMPARISON:  11/28/2019  FINDINGS: Lower chest: Bibasilar atelectasis. Mild cardiomegaly. Large hiatal hernia.  Hepatobiliary: Status post cholecystectomy. Liver unremarkable on this noncontrast examination. Stable extrahepatic biliary ductal dilation likely representing post cholecystectomy change. No intrahepatic biliary ductal dilation.  Pancreas: Unremarkable  Spleen: Unremarkable  Adrenals/Urinary Tract: The adrenal glands are unremarkable. The kidneys are normal in position. Mild bilateral renal cortical atrophy appears stable. Stable 7 mm nonobstructing calculus within the upper pole the right kidney. Tiny cortical hypodensity arises exophytically from the interpolar region of the left kidney which is too small to characterize but appears stable since prior examination and likely represents a tiny cortical cyst. No follow-up imaging is recommended for this lesion. The kidneys are otherwise unremarkable. The bladder is unremarkable.  Stomach/Bowel: Mild to moderate pancolonic diverticulosis is present, most severe within the sigmoid colon. The intra-abdominal stomach, small bowel, and large bowel are otherwise unremarkable. The appendix is unremarkable. No free  intraperitoneal gas or fluid.  Vascular/Lymphatic: Moderate atherosclerotic calcification within  the abdominal aorta. No aortic aneurysm. No pathologic adenopathy within the abdomen and pelvis.  Reproductive: Stable fatty mass within the lower uterine segment in keeping with a lipoleiomyoma. The pelvic organs are otherwise unremarkable.  Other: No abdominal wall hernia  Musculoskeletal: Degenerative changes are noted within the visualized thoracolumbar spine. Stable superior endplate fracture of L1 with minimal loss of height. No acute bone abnormality. No lytic or blastic bone lesion.  IMPRESSION: 1. No acute intra-abdominal pathology identified. 2. Mild cardiomegaly. 3. Large hiatal hernia. 4. Mild to moderate pancolonic diverticulosis without superimposed acute inflammatory change. 5. Stable 7 mm nonobstructing right nephrolithiasis. 6. 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 7.  Aortic Atherosclerosis (ICD10-I70.0).  Electronically Signed   By: Helyn Numbers M.D.   On: 01/31/2023 03:28  Lab Results  Component Value Date   WBC 10.1 01/30/2023   HGB 12.6 01/30/2023   HCT 39.9 01/30/2023   MCV 93.2 01/30/2023   PLT 210 01/30/2023   Last metabolic panel Lab Results  Component Value Date   GLUCOSE 98 01/30/2023   NA 138 01/30/2023   K 3.8 01/30/2023   CL 100 01/30/2023   CO2 28 01/30/2023   BUN 17 01/30/2023   CREATININE 1.67 (H) 01/30/2023   GFRNONAA 30 (L) 01/30/2023   CALCIUM 9.1 01/30/2023   PHOS 3.9 12/01/2019   PROT 7.1 01/30/2023   ALBUMIN 3.6 01/30/2023   BILITOT 0.6 01/30/2023   ALKPHOS 63 01/30/2023   AST 22 01/30/2023   ALT 17 01/30/2023   ANIONGAP 10 01/30/2023    Assessment and Plan: * AMS (altered mental status) Generalized confusion and lethargy in the setting of dehydration, AKI UTI CT head grossly stable Unclear of general baseline IV fluid hydration IV Rocephin for UTI coverage Monitor mentation  closely Follow   TIA (transient ischemic attack) Remote history of TIA in the past CT head stable today in setting of altered mental status presentation Reassess for further neuroimaging if symptoms persist despite treatment regimen noted above Follow  UTI (urinary tract infection) Urinalysis indicative of infection with noted worsening confusion dehydration IV Rocephin Urine culture Follow  AKI (acute kidney injury) (HCC) Creatinine 1.7 on presentation with baseline creatinine around 0.6-0.9 Suspect prerenal etiology as patient is clinically dry on exam IV fluid hydration Check FENa Hold nephrotoxic agents Follow  HTN (hypertension) BP stable Titrate home regimen  Acquired hypothyroidism Continue Synthroid  Meningioma (HCC) Remote history of meningioma during admission April 2023 CT head stable Will monitor for now Reassess as clinically indicated  Dementia (HCC) Continue Namenda  CAD (coronary artery disease) Troponin negative x 1 with grossly stable EKG Following  GERD (gastroesophageal reflux disease) PPI      Advance Care Planning:   Code Status: Full Code   Consults: None   Family Communication: No family at the bedside   Severity of Illness: The appropriate patient status for this patient is INPATIENT. Inpatient status is judged to be reasonable and necessary in order to provide the required intensity of service to ensure the patient's safety. The patient's presenting symptoms, physical exam findings, and initial radiographic and laboratory data in the context of their chronic comorbidities is felt to place them at high risk for further clinical deterioration. Furthermore, it is not anticipated that the patient will be medically stable for discharge from the hospital within 2 midnights of admission.   * I certify that at the point of admission it is my clinical judgment that the patient will require inpatient hospital care spanning  beyond 2 midnights  from the point of admission due to high intensity of service, high risk for further deterioration and high frequency of surveillance required.*  Author: [name], MD 01/31/2023 9:17 AM  For on call review [email or web address].

## 2023-01-31 NOTE — Assessment & Plan Note (Signed)
Continue Namenda

## 2023-01-31 NOTE — Assessment & Plan Note (Signed)
PPI ?

## 2023-01-31 NOTE — Evaluation (Signed)
Clinical/Bedside Swallow Evaluation Patient Details  Name: Brianna Woods MRN: [id number] Date of Birth: 04-16-1935  Today's Date: 01/31/2023 Time: SLP Start Time (ACUTE ONLY): 1300 SLP Stop Time (ACUTE ONLY): 1400 SLP Time Calculation (min) (ACUTE ONLY): 60 min  Past Medical History:  Past Medical History:  Diagnosis Date   Anemia    Arthritis    Asthma    per pt   Atherosclerosis of abdominal aorta (HCC) 07/2015   by CT   Barrett's esophagus    on EGD 2008, EGD WNL 2013   CAD (coronary artery disease) 07/2015   of LAD by CT   Diverticulosis 2014   sigmoid on colonoscopy   Fatty pancreas 07/2015   by CT   Fibromyalgia    per pt, no records of this    GERD (gastroesophageal reflux disease)    and esoph stricture s/p dilation 2008 HH by CT 2016   Glaucoma    History of anxiety    was on prozac then effexor (pt denies h/o anxiety/depression)   History of cardiac murmur    History of chicken pox    History of CVA (cerebrovascular accident) 2008   "I've had several TIAs"   History of right bundle branch block 2011   History of ulcer disease    per pt, no records of this   HLD (hyperlipidemia)    HTN (hypertension)    Kidney cyst, acquired 07/2015   by CT, rec rpt renal MRI in 6 months   Mixed incontinence    on ditropan   Nephrolithiasis    s/p surgery R kidney (9mm) 11/2009   Nodular goiter, toxic or with hyperthyroidism    h/o toxic, on methimazole, known large left thyroid nodule 08/2013 s/p beign biopsy per patient 2010, saw Dr. Tedd Sias, Rad I ablation 04/2014   Vitamin D deficiency    Past Surgical History:  Past Surgical History:  Procedure Laterality Date   CATARACT EXTRACTION  1990   w/ implants   CHOLECYSTECTOMY  2006   COLONOSCOPY  10/02/12   diverticulosis, o/w WNL ([facility])   dexa     no records received   ESOPHAGOGASTRODUODENOSCOPY  11/2011   LA grade A esophagitis lower 1/3, dilated, med HH, o/w WNL - path: + GERD, no barrett's - f/u 11/2016   LITHOTRIPSY  Right 2011   thyroid uptake scan  04/2014   unifrm uptake, enlarged thyroid consistent with grave's dz   HPI:  Pt is a 88 y.o. female with medical history significant of Mod Dementia, Obesity, TIA, hyperlipidemia, hypertension, depression, fibromyalgia, coronary artery disease presenting with AMS, UTI, AKI.  Limited history in the setting of altered mental status.  Per report, patient with increased lethargy at her facility yesterday.  No reports of fevers or chills.  No nausea or vomiting.  No reports of chest pain or cough.  No reports of slurred speech or worsening weakness.  Head CT: No acute intracranial abnormality.  2. Generalized cerebral atrophy and chronic microvascular ischemic  changes of the white matter.  CXR: Cardiomegaly with central pulmonary vascular congestion.    Assessment / Plan / Recommendation  Clinical Impression   Pt seen for BSE today. Pt awake, verbal w/ tangential speech; Edentulous impacting articulation. Pt has Baseline Dementia w/ confusion baseline. MOD+ cues required for follow through w/ tasks. Pt was oriented to Self/name.  Afebrile, WBC wnl. On RA.  Pt appears to present w/ functional oropharyngeal phase swallowing using a modified diet consistency (PUREE foods); suspect oral phase dysphagia  in setting of Edentulous status and Baseline Dementia, mod+ declined Cognitive status. This can impact her overall awareness/timing of swallow and safety during po tasks which increases risk for aspiration, choking especially when Edentulous and not fully masticating solid foods AND during acute hospitalization/illness. Pt's risk can be reduced when following general aspiration precautions, Supervision at meals, and using a modified diet consistency (PUREE foods). She has been rec'd such diet during a previous hospitalization.    Pt consumed several trials of ice chips, purees, thin liquids w/ No overt, clinical s/s of aspiration noted: no coughing, no wet vocal quality, no  decline in respiratory status during/post trials. Oral phase was functional for bolus management and oral clearing of the consistencies given; timely A-P transfer and full oral clearing noted. Pt did not attempt any self-feeding (which increases risk for aspiration also).  OM Exam was cursory but appeared [facility] w/ No unilateral weakness noted. Confusion of OM tasks and oral care.     D/t pt's Baseline Dementia, acute illness/hospitalization, and Edentulous status w/ need for support w/ self-feeding at meals, recommend initiation of the dysphagia level 1(PUREE foods) diet w/ Thin liquids. General aspiration precautions; reduce Distractions during meals and engage pt during po's at meal for self-feeding. Pills Crushed in Puree for safer swallowing. Support w/ feeding at meals as needed.  MD/NSG updated. Dietician f/u.  ST services recommends follow at next venue of care POST Acuity of illness for any diet consistency upgrade -- at her NH where she is known to staff would be helpful. Recommend discussion w/ Palliative Care for GOC and education re: impact of Cognitive decline/Dementia on swallowing.  Pt appears at/close to her Baseline from a swallowing standpoint. Precautions posted in room.  SLP Visit Diagnosis: Dysphagia, unspecified (R13.10) (baseline Dementia; Edentulous status)    Aspiration Risk  Mild aspiration risk;Risk for inadequate nutrition/hydration (reduced following general precautions)    Diet Recommendation   dysphagia level 1(PUREE foods) diet w/ Thin liquids. General aspiration precautions; reduce Distractions during meals and engage pt during po's at meal for self-feeding. Support w/ feeding at meals as needed.  Medication Administration: Crushed with puree (for safer swallowing)    Other  Recommendations Recommended Consults:  (Dietician f/u; Palliative Care consult for GOC discussion) Oral Care Recommendations: Oral care BID;Oral care before and after PO;Staff/trained caregiver to  provide oral care    Recommendations for follow up therapy are one component of a multi-disciplinary discharge planning process, led by the attending physician.  Recommendations may be updated based on patient status, additional functional criteria and insurance authorization.  Follow up Recommendations Follow physician's recommendations for discharge plan and follow up therapies (next venue of care(NH))      Assistance Recommended at Discharge  Full d/t Dementia  Functional Status Assessment Patient has had a recent decline in their functional status and/or demonstrates limited ability to make significant improvements in function in a reasonable and predictable amount of time  Frequency and Duration  (n/a)   (n/a)       Prognosis Prognosis for improved oropharyngeal function: Fair Barriers to Reach Goals: Cognitive deficits;Time post onset;Severity of deficits;Behavior Barriers/Prognosis Comment: Edentulous status; Mod Dementia      Swallow Study   General Date of Onset: 01/30/23 HPI: Pt is a 88 y.o. female with medical history significant of Mod Dementia, Obesity, TIA, hyperlipidemia, hypertension, depression, fibromyalgia, coronary artery disease presenting with AMS, UTI, AKI.  Limited history in the setting of altered mental status.  Per report, patient with increased lethargy  at her facility yesterday.  No reports of fevers or chills.  No nausea or vomiting.  No reports of chest pain or cough.  No reports of slurred speech or worsening weakness.  Head CT: No acute intracranial abnormality.  2. Generalized cerebral atrophy and chronic microvascular ischemic  changes of the white matter.  CXR: Cardiomegaly with central pulmonary vascular congestion. Type of Study: Bedside Swallow Evaluation Previous Swallow Assessment: BSE in 2021; thin liquids then Diet Prior to this Study: NPO Temperature Spikes Noted: No (wbc 10.1) Respiratory Status: Room air History of Recent Intubation:  No Behavior/Cognition: Alert;Cooperative;Pleasant mood;Confused;Distractible;Requires cueing Oral Cavity Assessment: Within Functional Limits Oral Care Completed by SLP: Yes (attempted) Oral Cavity - Dentition: Edentulous Vision:  (n/a) Self-Feeding Abilities: Total assist Patient Positioning: Upright in bed (needed positioning) Baseline Vocal Quality: Normal (whispered sometimes (cognitive)) Volitional Cough: Cognitively unable to elicit Volitional Swallow: Unable to elicit    Oral/Motor/Sensory Function Overall Oral Motor/Sensory Function: Within functional limits (no unilateral weakness)   Ice Chips Ice chips: Within functional limits Presentation: Spoon (fed; 3 trials)   Thin Liquid Thin Liquid: Within functional limits Presentation: Straw (fed/supported; ~6 ozs total) Other Comments: water, juice    Nectar Thick Nectar Thick Liquid: Not tested   Honey Thick Honey Thick Liquid: Not tested   Puree Puree: Within functional limits Presentation: Spoon (fed; 4 ozs)   Solid     Solid: Not tested         Jerilynn Som, MS, CCC-SLP Speech Language Pathologist Rehab Services; [facility] [place] [phone] (ascom) Analynn Daum 01/31/2023,4:35 PM

## 2023-01-31 NOTE — Assessment & Plan Note (Addendum)
Generalized confusion and lethargy in the setting of dehydration, AKI UTI CT head grossly stable Unclear of general baseline IV fluid hydration IV Rocephin for UTI coverage Monitor mentation closely Follow

## 2023-01-31 NOTE — ED Provider Notes (Signed)
Patient signed out to me pending urinalysis.  This is a 88 year old female past medical history of GERD hypertension CVA presents with altered mental status.  Apparently has been more lethargic since yesterday.  Patient's labs overall reassuring negative lactate.  CT head is negative.  Chest x-ray showing some possible pulmonary venous congestion.  Patient was initially hypoxic on arrival but this resolved without intervention.  Staff did try cathing the patient twice and she had no urine in her bladder.  Does look dry and has AKI so was given 2 fluid boluses of 500 cc.  Urinalysis has 21-50 white cells and greater than 50 red cells.  Did obtain a CT renal study to evaluate for stone and this is negative.  Patient's blood pressure did downtrend but ultimately stabilized in the ED.  On my assessment she is quite somnolent and is not a provide history.  Given AKI possible UTI and altered mental status will admit to the hospitalist.   Georga Hacking, MD 01/31/23 [phone]

## 2023-01-31 NOTE — ED Notes (Signed)
Pt cleaned of incontinence. RN placed pt on purewick due to pt incontinence and bed/chair bound status.

## 2023-01-31 NOTE — Assessment & Plan Note (Signed)
Troponin negative x 1 with grossly stable EKG Following

## 2023-01-31 NOTE — Assessment & Plan Note (Signed)
Urinalysis indicative of infection with noted worsening confusion dehydration IV Rocephin Urine culture Follow

## 2023-01-31 NOTE — Assessment & Plan Note (Signed)
Creatinine 1.7 on presentation with baseline creatinine around 0.6-0.9 Suspect prerenal etiology as patient is clinically dry on exam IV fluid hydration Check FENa Hold nephrotoxic agents Follow

## 2023-01-31 NOTE — ED Notes (Signed)
Assumed care from jeremy,RN. Pt resting comfortably in bed at this time. Pt denies any current needs or questions. Call light with in reach.

## 2023-02-01 DIAGNOSIS — K2100 Gastro-esophageal reflux disease with esophagitis, without bleeding: Secondary | ICD-10-CM | POA: Diagnosis not present

## 2023-02-01 DIAGNOSIS — E039 Hypothyroidism, unspecified: Secondary | ICD-10-CM

## 2023-02-01 DIAGNOSIS — R4182 Altered mental status, unspecified: Secondary | ICD-10-CM | POA: Diagnosis not present

## 2023-02-01 DIAGNOSIS — F0390 Unspecified dementia without behavioral disturbance: Secondary | ICD-10-CM | POA: Diagnosis not present

## 2023-02-01 LAB — CBC
HCT: 33.8 % — ABNORMAL LOW (ref 36.0–46.0)
Hemoglobin: 10.7 g/dL — ABNORMAL LOW (ref 12.0–15.0)
MCH: 29.4 pg (ref 26.0–34.0)
MCHC: 31.7 g/dL (ref 30.0–36.0)
MCV: 92.9 fL (ref 80.0–100.0)
Platelets: 172 10*3/uL (ref 150–400)
RBC: 3.64 MIL/uL — ABNORMAL LOW (ref 3.87–5.11)
RDW: 14.2 % (ref 11.5–15.5)
WBC: 6.4 10*3/uL (ref 4.0–10.5)
nRBC: 0 % (ref 0.0–0.2)

## 2023-02-01 LAB — COMPREHENSIVE METABOLIC PANEL
ALT: 16 U/L (ref 0–44)
AST: 20 U/L (ref 15–41)
Albumin: 3 g/dL — ABNORMAL LOW (ref 3.5–5.0)
Alkaline Phosphatase: 63 U/L (ref 38–126)
Anion gap: 7 (ref 5–15)
BUN: 16 mg/dL (ref 8–23)
CO2: 27 mmol/L (ref 22–32)
Calcium: 8.6 mg/dL — ABNORMAL LOW (ref 8.9–10.3)
Chloride: 108 mmol/L (ref 98–111)
Creatinine, Ser: 0.89 mg/dL (ref 0.44–1.00)
GFR, Estimated: >60 mL/min (ref >60)
Glucose, Bld: 90 mg/dL (ref 70–99)
Potassium: 4 mmol/L (ref 3.5–5.1)
Sodium: 142 mmol/L (ref 135–145)
Total Bilirubin: 0.7 mg/dL (ref 0.3–1.2)
Total Protein: 6.3 g/dL — ABNORMAL LOW (ref 6.5–8.1)

## 2023-02-01 LAB — URINE CULTURE: Culture: <10000 — AB

## 2023-02-01 LAB — T4, FREE: Free T4: 0.77 ng/dL (ref 0.61–1.12)

## 2023-02-01 LAB — TSH: TSH: 2.05 u[IU]/mL (ref 0.350–4.500)

## 2023-02-01 MED ORDER — ADULT MULTIVITAMIN W/MINERALS CH
1.0000 | ORAL_TABLET | Freq: Every day | ORAL | Status: DC
  Administered 2023-02-01 – 2023-02-05 (×5): 1 via ORAL
  Filled 2023-02-01 (×5): qty 1

## 2023-02-01 MED ORDER — ENSURE ENLIVE PO LIQD
237.0000 mL | Freq: Two times a day (BID) | ORAL | Status: DC
  Administered 2023-02-01 – 2023-02-05 (×6): 237 mL via ORAL

## 2023-02-01 NOTE — Progress Notes (Signed)
Initial Nutrition Assessment  DOCUMENTATION CODES:   Morbid obesity  INTERVENTION:  - Add Ensure Enlive po BID, each supplement provides 350 kcal and 20 grams of protein.  - Add MVI q day.   NUTRITION DIAGNOSIS:   Inadequate oral intake related to poor appetite as evidenced by meal completion < 50%.  GOAL:   Patient will meet greater than or equal to 90% of their needs  MONITOR:   PO intake, Supplement acceptance  REASON FOR ASSESSMENT:   Malnutrition Screening Tool    ASSESSMENT:   88 y.o. female admits related to AMS. PMH includes: TIA, dementia, HLD, HTN, depression, fibromyalgia, CAD. Pt is currently receiving medical management related to AMS.  Meds reviewed. Labs reviewed: WDL.   Pt reports that she has had a poor appetite for about 2-3 days PTA. She states that she has not eaten much of her meals today. Pt reports that she does like strawberry Ensure. RD will add supplements and continue to monitor PO intakes.   NUTRITION - FOCUSED PHYSICAL EXAM:  WDL - no wasting noted.   Diet Order:   Diet Order             DIET - DYS 1 Room service appropriate? No; Fluid consistency: Thin  Diet effective now                   EDUCATION NEEDS:   Not appropriate for education at this time  Skin:  Skin Assessment: Reviewed RN Assessment  Last BM:  PTA  Height:   Ht Readings from Last 1 Encounters:  01/30/23 5' (1.524 m)    Weight:   Wt Readings from Last 1 Encounters:  01/30/23 99.8 kg    Ideal Body Weight:     BMI:  Body mass index is 42.97 kg/m.  Estimated Nutritional Needs:   Kcal:  1610-9604 kcals  Protein:  100-110 gm  Fluid:  >/= 1.9 L  Bethann Humble, RD, LDN, CNSC.

## 2023-02-01 NOTE — TOC Initial Note (Signed)
Transition of Care [facility]) - Initial/Assessment Note    Patient Details  Name: Brianna Woods MRN: [id number] Date of Birth: 1935-02-20  [facility]) CM/SW Contact:    Chapman Fitch, RN Phone Number: 02/01/2023, 4:52 PM  Clinical Narrative:                  Admitted for: UTI, TIA Admitted from: [facility]   Patient with hx of dementia, A&O x2 VM left for son Mr Tenny Craw to confirm plan is to return [facility] at discharge Confirmed with Tammy at [facility] that patient is LTC Fl2 sent for signature   Expected Discharge Plan: Skilled Nursing Facility Barriers to Discharge: Continued Medical Work up   Patient Goals and CMS Choice            Expected Discharge Plan and Services                                              Prior Living Arrangements/Services   Lives with:: Facility Resident                   Activities of Daily Living Home Assistive Devices/Equipment: Nurse, adult, Wheelchair ADL Screening (condition at time of admission) Patient's cognitive ability adequate to safely complete daily activities?: Yes Is the patient deaf or have difficulty hearing?: No Does the patient have difficulty seeing, even when wearing glasses/contacts?: No Does the patient have difficulty concentrating, remembering, or making decisions?: Yes Patient able to express need for assistance with ADLs?: Yes Does the patient have difficulty dressing or bathing?: Yes Independently performs ADLs?: No Communication: Independent Dressing (OT): Dependent Is this a change from baseline?: Pre-admission baseline Grooming: Dependent Is this a change from baseline?: Pre-admission baseline Feeding: Independent Bathing: Dependent Is this a change from baseline?: Pre-admission baseline Toileting: Dependent Is this a change from baseline?: Pre-admission baseline In/Out Bed: Dependent Is this a change from baseline?: Pre-admission baseline Walks in Home: Dependent Is this a change  from baseline?: Pre-admission baseline Does the patient have difficulty walking or climbing stairs?: Yes Weakness of Legs: Both Weakness of Arms/Hands: Both  Permission Sought/Granted                  Emotional Assessment              Admission diagnosis:  Altered mental status, unspecified altered mental status type [R41.82] AMS (altered mental status) [R41.82] Patient Active Problem List   Diagnosis Date Noted   AMS (altered mental status) 01/31/2023   AKI (acute kidney injury) (HCC) 01/31/2023   TIA (transient ischemic attack) 12/21/2021   UTI (urinary tract infection) 12/21/2021   Dementia (HCC) 12/21/2021   Meningioma (HCC) 12/21/2021   Protein-calorie malnutrition, severe 11/29/2019   Palliative care by specialist    Hypokalemia    Hypoglycemia    Adult failure to thrive    Hypernatremia    Altered mental status 11/23/2019   Encephalopathy    Somnolence 10/13/2019   Low back pain 02/11/2019   Acquired hypothyroidism 02/11/2019   Injury of face 11/21/2018   MDD (major depressive disorder), single episode, moderate (HCC) 07/09/2018   Glaucoma    Osteopenia 06/09/2018   Hiatal hernia 06/09/2018   Diverticulosis 06/09/2018   Weight loss 05/23/2018   Dysphagia 05/23/2018   Insomnia 05/23/2018   Uterine mass 05/23/2018   Tricompartment osteoarthritis of right knee  02/13/2018   Skin rash 04/21/2016   Increased endometrial stripe thickness 10/21/2015   Nephrolithiasis    Pedal edema 09/25/2015   Generalized abdominal pain 07/26/2015   Kidney cyst, acquired 07/13/2015   CAD (coronary artery disease) 07/13/2015   Atherosclerosis of abdominal aorta (HCC) 07/13/2015   Fatty pancreas 07/13/2015   Goals of care, counseling/discussion 03/22/2015   Intertrigo 03/22/2015   Advanced care planning/counseling discussion 09/21/2014   Medicare annual wellness visit, subsequent 03/20/2014   Urinary incontinence 03/09/2014   Chest discomfort 08/15/2013   Anxiety  attack 05/18/2012   History of right bundle branch block    Vitamin D deficiency 02/10/2012   Dizziness 01/22/2012   Fibromyalgia    Asthma    Nodular goiter, toxic or with hyperthyroidism    GERD (gastroesophageal reflux disease)    Mixed incontinence    HTN (hypertension)    HLD (hyperlipidemia)    PCP:  Pcp, No Pharmacy:  No Pharmacies Listed    Social Determinants of Health (SDOH) Social History: SDOH Screenings   Housing: Patient Unable To Answer (01/31/2023)  Depression (PHQ2-9): Medium Risk (11/21/2018)  Tobacco Use: Low Risk  (01/31/2023)   SDOH Interventions:     Readmission Risk Interventions     No data to display

## 2023-02-01 NOTE — NC FL2 (Signed)
[place] MEDICAID FL2 LEVEL OF CARE FORM     IDENTIFICATION  Patient Name: Brianna Woods Birthdate: 06-Oct-1934 Sex: female Admission Date (Current Location): 01/30/2023  [facility] and [place] Number:  Chiropodist and Address:         Provider Number: [id number]  Attending Physician Name and Address:  Kathlen Mody, MD  Relative Name and Phone Number:       Current Level of Care: Hospital Recommended Level of Care: Nursing Facility Prior Approval Number:    Date Approved/Denied:   PASRR Number: [id number]  Discharge Plan: SNF    Current Diagnoses: Patient Active Problem List   Diagnosis Date Noted   AMS (altered mental status) 01/31/2023   AKI (acute kidney injury) (HCC) 01/31/2023   TIA (transient ischemic attack) 12/21/2021   UTI (urinary tract infection) 12/21/2021   Dementia (HCC) 12/21/2021   Meningioma (HCC) 12/21/2021   Protein-calorie malnutrition, severe 11/29/2019   Palliative care by specialist    Hypokalemia    Hypoglycemia    Adult failure to thrive    Hypernatremia    Altered mental status 11/23/2019   Encephalopathy    Somnolence 10/13/2019   Low back pain 02/11/2019   Acquired hypothyroidism 02/11/2019   Injury of face 11/21/2018   MDD (major depressive disorder), single episode, moderate (HCC) 07/09/2018   Glaucoma    Osteopenia 06/09/2018   Hiatal hernia 06/09/2018   Diverticulosis 06/09/2018   Weight loss 05/23/2018   Dysphagia 05/23/2018   Insomnia 05/23/2018   Uterine mass 05/23/2018   Tricompartment osteoarthritis of right knee 02/13/2018   Skin rash 04/21/2016   Increased endometrial stripe thickness 10/21/2015   Nephrolithiasis    Pedal edema 09/25/2015   Generalized abdominal pain 07/26/2015   Kidney cyst, acquired 07/13/2015   CAD (coronary artery disease) 07/13/2015   Atherosclerosis of abdominal aorta (HCC) 07/13/2015   Fatty pancreas 07/13/2015   Goals of care, counseling/discussion 03/22/2015   Intertrigo  03/22/2015   Advanced care planning/counseling discussion 09/21/2014   Medicare annual wellness visit, subsequent 03/20/2014   Urinary incontinence 03/09/2014   Chest discomfort 08/15/2013   Anxiety attack 05/18/2012   History of right bundle branch block    Vitamin D deficiency 02/10/2012   Dizziness 01/22/2012   Fibromyalgia    Asthma    Nodular goiter, toxic or with hyperthyroidism    GERD (gastroesophageal reflux disease)    Mixed incontinence    HTN (hypertension)    HLD (hyperlipidemia)     Orientation RESPIRATION BLADDER Height & Weight     Self, Place  Normal Incontinent Weight: 99.8 kg Height:  5' (152.4 cm)  BEHAVIORAL SYMPTOMS/MOOD NEUROLOGICAL BOWEL NUTRITION STATUS      Continent Diet (dys 1)  AMBULATORY STATUS COMMUNICATION OF NEEDS Skin   Total Care Verbally Normal                       Personal Care Assistance Level of Assistance              Functional Limitations Info             SPECIAL CARE FACTORS FREQUENCY                       Contractures Contractures Info: Not present    Additional Factors Info  Code Status, Allergies Code Status Info: Full Allergies Info: Ivp Dye (Iodinated Contrast Media), Penicillins, Statins           Current Medications (  02/01/2023):  This is the current hospital active medication list Current Facility-Administered Medications  Medication Dose Route Frequency Provider Last Rate Last Admin   0.9 %  sodium chloride infusion   Intravenous Continuous [name], MD 75 mL/hr at 02/01/23 0114 New Bag at 02/01/23 0114   albuterol (PROVENTIL) (2.5 MG/3ML) 0.083% nebulizer solution 3 mL  3 mL Inhalation Q4H PRN [name], MD       amitriptyline (ELAVIL) tablet 25 mg  25 mg Oral QHS [name], MD   25 mg at 01/31/23 2146   aspirin chewable tablet 81 mg  81 mg Oral Daily [name], MD   81 mg at 02/01/23 1016   divalproex (DEPAKOTE SPRINKLE) capsule 250 mg  250 mg Oral BID [name], MD   250 mg at 02/01/23 1016   enoxaparin (LOVENOX) injection 40 mg  40 mg Subcutaneous Q24H [name], MD   40 mg at 02/01/23 1017   feeding supplement (ENSURE ENLIVE / ENSURE PLUS) liquid 237 mL  237 mL Oral BID BM Kathlen Mody, MD   237 mL at 02/01/23 1557   levothyroxine (SYNTHROID) tablet 75 mcg  75 mcg Oral QAC breakfast [name], MD   75 mcg at 02/01/23 4098   memantine (NAMENDA) tablet 5 mg  5 mg Oral BID [name], MD   5 mg at 02/01/23 1017   multivitamin with minerals tablet 1 tablet  1 tablet Oral Daily Kathlen Mody, MD       ondansetron (ZOFRAN) tablet 4 mg  4 mg Oral Q6H PRN [name], MD       Or   ondansetron [facility]) injection 4 mg  4 mg Intravenous Q6H PRN [name], MD       pantoprazole (PROTONIX) EC tablet 40 mg  40 mg Oral Daily [name], MD   40 mg at 02/01/23 1017     Discharge Medications: Please see discharge summary for a list of discharge medications.  Relevant Imaging Results:  Relevant Lab Results:   Additional Information SS# [id number]  Chapman Fitch, RN

## 2023-02-01 NOTE — Evaluation (Signed)
Physical Therapy Evaluation Patient Details Name: Christon Rindler MRN: [id number] DOB: June 22, 1935 Today's Date: 02/01/2023  History of Present Illness  Mehek Zody is a 88 y/o F with PMH including HTN, UTI, dementia, meningioma, incontinence, OA, fibromyalgia, glaucoma, dizziness, and anxiety. Admitted with AMS and lethargy. Lives at [facility] LTC.   Clinical Impression  Patient received in bed, she is oriented to person, place. She is pleasant and agrees to PT assessment. She requires +2 max assist for basic bed mobility. She is unable to maintain sitting at edge of bed without heavy B UE support and min A due to heavy posterior leaning. Patient reports she does not get out of bed at the facility she is at. Therefore, patient is at her baseline and does not meet criteria for continued skilled PT here. Will sign off.            Recommendations for follow up therapy are one component of a multi-disciplinary discharge planning process, led by the attending physician.  Recommendations may be updated based on patient status, additional functional criteria and insurance authorization.  Follow Up Recommendations Can patient physically be transported by private vehicle: No     Assistance Recommended at Discharge Frequent or constant Supervision/Assistance  Patient can return home with the following  Two people to help with walking and/or transfers;A lot of help with bathing/dressing/bathroom;Direct supervision/assist for medications management;Assistance with cooking/housework    Equipment Recommendations None recommended by PT  Recommendations for Other Services       Functional Status Assessment Patient has not had a recent decline in their functional status     Precautions / Restrictions Precautions Precautions: Fall Restrictions Weight Bearing Restrictions: No      Mobility  Bed Mobility Overal bed mobility: Needs Assistance Bed Mobility: Supine to Sit, Sit to Supine     Supine to  sit: Max assist, +2 for physical assistance, HOB elevated Sit to supine: Max assist, +2 for physical assistance   General bed mobility comments: Pt able to help by holding onto rails. Pt with strong posterior lean; weak core and reliant upon UE to keep self upright while holding rails.    Transfers                   General transfer comment: unsafe to transfer at this time    Ambulation/Gait               General Gait Details: unable, non ambulatory for some time  Stairs            Wheelchair Mobility    Modified Rankin (Stroke Patients Only)       Balance Overall balance assessment: Needs assistance Sitting-balance support: Feet supported, Bilateral upper extremity supported Sitting balance-Leahy Scale: Poor Sitting balance - Comments: heavy posterior lean. Unable to remain sitting without significant UE support on bed rails Postural control: Posterior lean                                   Pertinent Vitals/Pain Pain Assessment Pain Assessment: PAINAD Breathing: normal Negative Vocalization: occasional moan/groan, low speech, negative/disapproving quality Facial Expression: sad, frightened, frown Body Language: relaxed Consolability: no need to console PAINAD Score: 2 Pain Intervention(s): Monitored during session, Repositioned    Home Living Family/patient expects to be discharged to:: Skilled nursing facility                   Additional  Comments: Has lived at [facility] for several years, per medical record.    Prior Function Prior Level of Function : Needs assist;Patient poor historian/Family not available       Physical Assist : Mobility (physical);ADLs (physical)     Mobility Comments: Pt states that she doesn't get out of bed at SNF because they don't want her to fall. Unclear when the last time pt sat OOB or transferred was. ADLs Comments: Pt states she can self-feed at baseline. Assist bed level for all other  ADLs.     Hand Dominance   Dominant Hand: Left    Extremity/Trunk Assessment   Upper Extremity Assessment Upper Extremity Assessment: Defer to OT evaluation    Lower Extremity Assessment Lower Extremity Assessment: Generalized weakness    Cervical / Trunk Assessment Cervical / Trunk Assessment: Normal  Communication   Communication: No difficulties  Cognition Arousal/Alertness: Awake/alert Behavior During Therapy: WFL for tasks assessed/performed Overall Cognitive Status: History of cognitive impairments - at baseline                                 General Comments: Pt is orineted to person and "[facility]"; unsure of the date; cannot name the SNF she lives at, but does know that she's lived at [facility] for several years. Is able to state that she has fibromyalgia. Is able to name her two sons (not present at this time). 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        General Comments General comments (skin integrity, edema, etc.): On room air.    Exercises     Assessment/Plan    PT Assessment Patient does not need any further PT services  PT Problem List Decreased strength;Decreased activity tolerance;Decreased balance;Decreased mobility;Cardiopulmonary status limiting activity;Decreased cognition;Pain;Obesity       PT Treatment Interventions      PT Goals (Current goals can be found in the Care Plan section)  Acute Rehab PT Goals Patient Stated Goal: to return to SNF PT Goal Formulation: With patient Time For Goal Achievement: 02/03/23 Potential to Achieve Goals: Fair    Frequency       Co-evaluation PT/OT/SLP Co-Evaluation/Treatment: Yes Reason for Co-Treatment: Complexity of the patient's impairments (multi-system involvement);For patient/therapist safety;To address functional/ADL transfers PT goals addressed during session: Mobility/safety with  mobility;Balance OT goals addressed during session: Strengthening/ROM       AM-PAC PT "6 Clicks" Mobility  Outcome Measure Help needed turning from your back to your side while in a flat bed without using bedrails?: A Lot Help needed moving from lying on your back to sitting on the side of a flat bed without using bedrails?: Total Help needed moving to and from a bed to a chair (including a wheelchair)?: Total Help needed standing up from a chair using your arms (e.g., wheelchair or bedside chair)?: Total Help needed to walk in hospital room?: Total Help needed climbing 3-5 steps with a railing? : Total 6 Click Score: 7    End of Session   Activity Tolerance: Patient limited by fatigue;Patient limited by pain Patient left: in bed;with bed alarm set;with call bell/phone within reach Nurse Communication: Mobility status PT Visit Diagnosis: Other abnormalities of gait and mobility (R26.89);Muscle weakness (generalized) (M62.81)    Time: 1610-9604 PT Time Calculation (min) (ACUTE ONLY): 17 min   Charges:   PT  Evaluation $PT Eval Low Complexity: 1 Low          Makyi Ledo, PT, GCS 02/01/23,3:38 PM

## 2023-02-01 NOTE — Progress Notes (Signed)
[facility]                                                                               Brianna Woods, is a 88 y.o. female, DOB - 08-Nov-1934, ZOX:[id number] Admit date - 01/30/2023    Outpatient Primary MD for the patient is Pcp, No  LOS - 1  days    Brief summary   88 y.o. female with medical history significant of TIA, dementia, hyperlipidemia, hypertension, depression, fibromyalgia, coronary artery disease presenting with AMS, UTI, AKI.  Limited history in the setting of altered mental status. Noted admission April 2023 for lethargy and right-sided facial droop concerning for TIA.  Presented to the ER afebrile, hemodynamically stable. Did have some transient blood pressures into the 80s which improved with IV fluid hydration. White count 10.1, hemoglobin 12.6, platelets 210, urinalysis positive for leukocytes, troponin and BNP stable. Chest x-ray and CT head grossly stable though with some cardiomegaly and vascular congestion on imaging   Urine cultures negative.  AKI improving.  Patient is more alert and answering simple questions.    Assessment & Plan    Assessment and Plan: Acute metabolic encephalopathy from UTI.  Generalized confusion and lethargy in the setting of dehydration, AKI  CT head grossly stable Unclear of general baseline Unable to reach family.  She was started on IV fluids with improvement in mental status and renal parameters.  She is alert and oriented to place and person today.     TIA (transient ischemic attack) Remote history of TIA in the past CT head stable today in setting of altered mental status presentation Reassess for further neuroimaging if symptoms persist despite treatment . She is alert and oriented to place and person only.  Unclear what her baseline is.    UTI (urinary tract infection) UTI ruled out. With cultures less <10,000  AKI (acute kidney injury) (HCC) Creatinine 1.7 on presentation with baseline creatinine  around 0.6-0.9 Suspect prerenal etiology as patient is clinically dry on exam Improvement renal parameters with IV fluids.   HTN (hypertension) Well controlled.   Acquired hypothyroidism Continue Synthroid Get TSH.   Meningioma [facility]) Remote history of meningioma during admission April 2023 CT head stable Will monitor for now Reassess as clinically indicated  Dementia (HCC) Continue Namenda  CAD (coronary artery disease) Troponin negative x 1 with grossly stable EKG Following  GERD (gastroesophageal reflux disease) PPI   Estimated body mass index is 42.97 kg/m as calculated from the following:   Height as of this encounter: 5' (1.524 m).   Weight as of this encounter: 99.8 kg.  Code Status: full code.  DVT Prophylaxis:  enoxaparin (LOVENOX) injection 40 mg Start: 01/31/23 1000 SCDs Start: 01/31/23 0907   Level of Care: Level of care: Med-Surg Family Communication: None at bedside.   Disposition Plan:     Remains inpatient appropriate:  further eval of AMS.   Procedures:  None.   Consultants:   None.   Antimicrobials:   Anti-infectives (From admission, onward)    Start     Dose/Rate Route Frequency Ordered Stop   02/01/23 0700  cefTRIAXone (ROCEPHIN) 1 g in sodium chloride 0.9 % 100 mL IVPB  1 g 200 mL/hr over 30 Minutes Intravenous  Once 01/31/23 0907 02/01/23 0652   01/31/23 0245  cefTRIAXone (ROCEPHIN) 1 g in sodium chloride 0.9 % 100 mL IVPB        1 g 200 mL/hr over 30 Minutes Intravenous  Once 01/31/23 0230 01/31/23 0406        Medications  Scheduled Meds:  amitriptyline  25 mg Oral QHS   aspirin  81 mg Oral Daily   divalproex  250 mg Oral BID   enoxaparin (LOVENOX) injection  40 mg Subcutaneous Q24H   levothyroxine  75 mcg Oral QAC breakfast   memantine  5 mg Oral BID   pantoprazole  40 mg Oral Daily   Continuous Infusions:  sodium chloride 75 mL/hr at 02/01/23 0114   PRN Meds:.albuterol, ondansetron **OR** ondansetron (ZOFRAN)  IV    Subjective:   Brianna Woods was seen and examined today.  No new complaints today.   Objective:   Vitals:   01/31/23 1757 01/31/23 2129 02/01/23 0527 02/01/23 0818  BP: 105/68 (!) 151/59 108/69 128/74  Pulse: 85 82 84 87  Resp: 15 18 18 14   Temp: 98.3 F (36.8 C) 98.3 F (36.8 C) 98.3 F (36.8 C) 98.3 F (36.8 C)  TempSrc: Axillary     SpO2: 93% 97% 92% 95%  Weight:      Height:        Intake/Output Summary (Last 24 hours) at 02/01/2023 1129 Last data filed at 02/01/2023 1042 Gross per 24 hour  Intake 1425.47 ml  Output --  Net 1425.47 ml   Filed Weights   01/30/23 2022  Weight: 99.8 kg     Exam General exam: Appears calm and comfortable  Respiratory system: Clear to auscultation. Respiratory effort normal. Cardiovascular system: S1 & S2 heard, RRR.  Gastrointestinal system: Abdomen is nondistended, soft and nontender.  Central nervous system: Alert and oriented to place and person.  Extremities: ankle edema present.  Skin: No rashes,  Psychiatry: Mood is appropriate.     Data Reviewed:  I have personally reviewed following labs and imaging studies   CBC Lab Results  Component Value Date   WBC 6.4 02/01/2023   RBC 3.64 (L) 02/01/2023   HGB 10.7 (L) 02/01/2023   HCT 33.8 (L) 02/01/2023   MCV 92.9 02/01/2023   MCH 29.4 02/01/2023   PLT 172 02/01/2023   MCHC 31.7 02/01/2023   RDW 14.2 02/01/2023   LYMPHSABS 3.2 01/30/2023   MONOABS 1.1 (H) 01/30/2023   EOSABS 0.1 01/30/2023   BASOSABS 0.0 01/30/2023     Last metabolic panel Lab Results  Component Value Date   NA 142 02/01/2023   K 4.0 02/01/2023   CL 108 02/01/2023   CO2 27 02/01/2023   BUN 16 02/01/2023   CREATININE 0.89 02/01/2023   GLUCOSE 90 02/01/2023   GFRNONAA >60 02/01/2023   GFRAA >60 12/01/2019   CALCIUM 8.6 (L) 02/01/2023   PHOS 3.9 12/01/2019   PROT 6.3 (L) 02/01/2023   ALBUMIN 3.0 (L) 02/01/2023   BILITOT 0.7 02/01/2023   ALKPHOS 63 02/01/2023   AST 20 02/01/2023    ALT 16 02/01/2023   ANIONGAP 7 02/01/2023    CBG (last 3)  No results for input(s): "GLUCAP" in the last 72 hours.    Coagulation Profile: No results for input(s): "INR", "PROTIME" in the last 168 hours.   Radiology Studies: US RENAL  Result Date: 01/31/2023 CLINICAL DATA:  Acute kidney injury. EXAM: RENAL / URINARY TRACT ULTRASOUND COMPLETE  COMPARISON:  CT stone study earlier same day FINDINGS: Right Kidney: Renal measurements: 9.9 x 3.8 x 4.3 cm = volume: 84 mL. Nonobstructing stone identified towards the upper pole. No hydronephrosis. Left Kidney: Renal measurements: 11.2 x 5.9 x 5.0 cm = volume: 170 mL. No hydronephrosis. Bladder: Appears normal for degree of bladder distention. Other: None. IMPRESSION: 1. No hydronephrosis. 2. Nonobstructing stone upper pole right kidney. Electronically Signed   By: Kennith Center M.D.   On: 01/31/2023 10:30   CT Renal Stone Study  Result Date: 01/31/2023 CLINICAL DATA:  Abdominal/flank pain, stone suspected EXAM: CT ABDOMEN AND PELVIS WITHOUT CONTRAST TECHNIQUE: Multidetector CT imaging of the abdomen and pelvis was performed following the standard protocol without IV contrast. RADIATION DOSE REDUCTION: This exam was performed according to the departmental dose-optimization program which includes automated exposure control, adjustment of the mA and/or kV according to patient size and/or use of iterative reconstruction technique. COMPARISON:  11/28/2019 FINDINGS: Lower chest: Bibasilar atelectasis. Mild cardiomegaly. Large hiatal hernia. Hepatobiliary: Status post cholecystectomy. Liver unremarkable on this noncontrast examination. Stable extrahepatic biliary ductal dilation likely representing post cholecystectomy change. No intrahepatic biliary ductal dilation. Pancreas: Unremarkable Spleen: Unremarkable Adrenals/Urinary Tract: The adrenal glands are unremarkable. The kidneys are normal in position. Mild bilateral renal cortical atrophy appears stable.  Stable 7 mm nonobstructing calculus within the upper pole the right kidney. Tiny cortical hypodensity arises exophytically from the interpolar region of the left kidney which is too small to characterize but appears stable since prior examination and likely represents a tiny cortical cyst. No follow-up imaging is recommended for this lesion. The kidneys are otherwise unremarkable. The bladder is unremarkable. Stomach/Bowel: Mild to moderate pancolonic diverticulosis is present, most severe within the sigmoid colon. The intra-abdominal stomach, small bowel, and large bowel are otherwise unremarkable. The appendix is unremarkable. No free intraperitoneal gas or fluid. Vascular/Lymphatic: Moderate atherosclerotic calcification within the abdominal aorta. No aortic aneurysm. No pathologic adenopathy within the abdomen and pelvis. Reproductive: Stable fatty mass within the lower uterine segment in keeping with a lipoleiomyoma. The pelvic organs are otherwise unremarkable. Other: No abdominal wall hernia Musculoskeletal: Degenerative changes are noted within the visualized thoracolumbar spine. Stable superior endplate fracture of L1 with minimal loss of height. No acute bone abnormality. No lytic or blastic bone lesion. IMPRESSION: 1. No acute intra-abdominal pathology identified. 2. Mild cardiomegaly. 3. Large hiatal hernia. 4. Mild to moderate pancolonic diverticulosis without superimposed acute inflammatory change. 5. Stable 7 mm nonobstructing right nephrolithiasis. 6. 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 7.  Aortic Atherosclerosis (ICD10-I70.0). Electronically Signed   By: Helyn Numbers M.D.   On: 01/31/2023 03:28   CT Head Wo Contrast  Result Date: 01/30/2023 CLINICAL DATA:  Mental status change, unknown cause. History of and heart failure. EXAM: CT HEAD WITHOUT CONTRAST TECHNIQUE: Contiguous axial images were obtained from the base of the skull through the vertex without intravenous contrast.  RADIATION DOSE REDUCTION: This exam was performed according to the departmental dose-optimization program which includes automated exposure control, adjustment of the mA and/or kV according to patient size and/or use of iterative reconstruction technique. COMPARISON:  CT examination dated December 21, 2021 FINDINGS: Brain: No evidence of acute infarction, hemorrhage, hydrocephalus, extra-axial collection or mass lesion/mass effect. Generalized cerebral atrophy. Low-attenuation of the periventricular white matter presumed chronic microvascular ischemic changes. Vascular: No hyperdense vessel or unexpected calcification. Skull: Normal. Negative for fracture or focal lesion. Sinuses/Orbits: No acute finding. Other: None. IMPRESSION: 1. No acute intracranial abnormality. 2. Generalized cerebral  atrophy and chronic microvascular ischemic changes of the white matter. Electronically Signed   By: Larose Hires D.O.   On: 01/30/2023 21:08   DG Chest Port 1 View  Result Date: 01/30/2023 CLINICAL DATA:  Hypoxia EXAM: PORTABLE CHEST 1 VIEW COMPARISON:  Chest x-ray 11/22/2019 FINDINGS: The heart is enlarged.  There central pulmonary vascular congestion. There is atelectasis in the left lung base. There is no pleural effusion or pneumothorax. No acute fractures are seen. IMPRESSION: Cardiomegaly with central pulmonary vascular congestion. Electronically Signed   By: Darliss Cheney M.D.   On: 01/30/2023 21:02       Kathlen Mody M.D. [facility] 02/01/2023, 11:29 AM  Available via Epic secure chat 7am-7pm After 7 pm, please refer to night coverage provider listed on amion.

## 2023-02-01 NOTE — Plan of Care (Signed)
  Problem: Education: Goal: Knowledge of General Education information will improve Description: Including pain rating scale, medication(s)/side effects and non-pharmacologic comfort measures 02/01/2023 1939 by Glenford Peers, RN Outcome: Progressing 02/01/2023 1832 by Glenford Peers, RN Outcome: Progressing   Problem: Health Behavior/Discharge Planning: Goal: Ability to manage health-related needs will improve 02/01/2023 1939 by Glenford Peers, RN Outcome: Progressing 02/01/2023 1832 by Glenford Peers, RN Outcome: Progressing   Problem: Clinical Measurements: Goal: Ability to maintain clinical measurements within normal limits will improve 02/01/2023 1939 by Glenford Peers, RN Outcome: Progressing 02/01/2023 1832 by Glenford Peers, RN Outcome: Progressing Goal: Will remain free from infection 02/01/2023 1939 by Glenford Peers, RN Outcome: Progressing 02/01/2023 1832 by Glenford Peers, RN Outcome: Progressing Goal: Diagnostic test results will improve 02/01/2023 1939 by Glenford Peers, RN Outcome: Progressing 02/01/2023 1832 by Glenford Peers, RN Outcome: Progressing Goal: Respiratory complications will improve 02/01/2023 1939 by Glenford Peers, RN Outcome: Progressing 02/01/2023 1832 by Glenford Peers, RN Outcome: Progressing Goal: Cardiovascular complication will be avoided 02/01/2023 1939 by Glenford Peers, RN Outcome: Progressing 02/01/2023 1832 by Glenford Peers, RN Outcome: Progressing   Problem: Activity: Goal: Risk for activity intolerance will decrease 02/01/2023 1939 by Glenford Peers, RN Outcome: Progressing 02/01/2023 1832 by Glenford Peers, RN Outcome: Progressing   Problem: Nutrition: Goal: Adequate nutrition will be maintained 02/01/2023 1939 by Glenford Peers, RN Outcome: Progressing 02/01/2023 1832 by Glenford Peers, RN Outcome: Progressing   Problem: Coping: Goal: Level of anxiety will  decrease 02/01/2023 1939 by Glenford Peers, RN Outcome: Progressing 02/01/2023 1832 by Glenford Peers, RN Outcome: Progressing   Problem: Elimination: Goal: Will not experience complications related to bowel motility 02/01/2023 1939 by Glenford Peers, RN Outcome: Progressing 02/01/2023 1832 by Glenford Peers, RN Outcome: Progressing Goal: Will not experience complications related to urinary retention 02/01/2023 1939 by Glenford Peers, RN Outcome: Progressing 02/01/2023 1832 by Glenford Peers, RN Outcome: Progressing   Problem: Pain Managment: Goal: General experience of comfort will improve 02/01/2023 1939 by Glenford Peers, RN Outcome: Progressing 02/01/2023 1832 by Glenford Peers, RN Outcome: Progressing   Problem: Safety: Goal: Ability to remain free from injury will improve 02/01/2023 1939 by Glenford Peers, RN Outcome: Progressing 02/01/2023 1832 by Glenford Peers, RN Outcome: Progressing   Problem: Skin Integrity: Goal: Risk for impaired skin integrity will decrease 02/01/2023 1939 by Glenford Peers, RN Outcome: Progressing 02/01/2023 1832 by Glenford Peers, RN Outcome: Progressing

## 2023-02-01 NOTE — Plan of Care (Signed)

## 2023-02-01 NOTE — Evaluation (Signed)
Occupational Therapy Evaluation Patient Details Name: Brianna Woods MRN: [id number] DOB: 09-20-1934 Today's Date: 02/01/2023   History of Present Illness Brianna Woods is a 88 y/o F with PMH including HTN, UTI, dementia, meningioma, incontinence, OA, fibromyalgia, glaucoma, dizziness, and anxiety. Admitted with AMS and lethargy. Lives at [facility].   Clinical Impression   Pt MAX A x2 to EOB; reliant on bedrails for balance in sitting; poor trunk strength. Pt appears at baseline (bedbound); d/c OT at this time, no acute needs.      Recommendations for follow up therapy are one component of a multi-disciplinary discharge planning process, led by the attending physician.  Recommendations may be updated based on patient status, additional functional criteria and insurance authorization.   Assistance Recommended at Discharge Frequent or constant Supervision/Assistance  Patient can return home with the following A lot of help with bathing/dressing/bathroom;Assistance with cooking/housework;Direct supervision/assist for medications management;Direct supervision/assist for financial management;Assist for transportation;Help with stairs or ramp for entrance    Functional Status Assessment  Patient has not had a recent decline in their functional status  Equipment Recommendations  None recommended by OT    Recommendations for Other Services       Precautions / Restrictions Precautions Precautions: Fall Restrictions Weight Bearing Restrictions: No      Mobility Bed Mobility Overal bed mobility: Needs Assistance Bed Mobility: Supine to Sit, Sit to Supine     Supine to sit: Max assist, +2 for physical assistance Sit to supine: Total assist, +2 for physical assistance   General bed mobility comments: Pt able to help by holding onto rails. Pt with strong posterior lean; weak core and reliant upon UE to keep self upright while holding rails.    Transfers                    General transfer comment: unsafe to transfer at this time      Balance Overall balance assessment: Needs assistance Sitting-balance support: Feet supported, Bilateral upper extremity supported Sitting balance-Leahy Scale: Poor                                     ADL either performed or assessed with clinical judgement   ADL Overall ADL's : At baseline                                       General ADL Comments: Pt appears to be at baseline; MAX A x2 bed mobility, does not mobilize at baseline; assist dependently in bed for most ADLs; likely able to self-feed and self-groom with set up at tray table.     Vision Baseline Vision/History: 3 Glaucoma       Perception     Praxis      Pertinent Vitals/Pain Pain Assessment Pain Assessment: PAINAD (states she has pain in her back and buttocks) Breathing: normal Negative Vocalization: occasional moan/groan, low speech, negative/disapproving quality Facial Expression: sad, frightened, frown Body Language: relaxed Consolability: no need to console PAINAD Score: 2     Hand Dominance Left   Extremity/Trunk Assessment Upper Extremity Assessment Upper Extremity Assessment: Generalized weakness;Overall WFL for tasks assessed (Functional during session for reaching for bedrails)   Lower Extremity Assessment Lower Extremity Assessment: Generalized weakness       Communication Communication Communication: No difficulties   Cognition Arousal/Alertness: Awake/alert  Behavior During Therapy: WFL for tasks assessed/performed Overall Cognitive Status: History of cognitive impairments - at baseline                                 General Comments: Pt is orineted to person and "[facility]"; unsure of the date; cannot name the SNF she lives at, but does know that she's lived at [facility] for several years. Is able to state that she has fibromyalgia. Is able to name her two sons (not present  at this time). 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     General Comments  On room air.    Exercises     Shoulder Instructions      Home Living Family/patient expects to be discharged to:: Skilled nursing facility                                 Additional Comments: Has lived at [facility] for several years, per medical record.      Prior Functioning/Environment Prior Level of Function : Needs assist;Patient poor historian/Family not available       Physical Assist : Mobility (physical);ADLs (physical)     Mobility Comments: Pt states that she doesn't get out of bed at SNF because they don't want her to fall. Unclear when the last time pt sat OOB or transferred was. ADLs Comments: Pt states she can self-feed at baseline. Assist bed level for all other ADLs.        OT Problem List:        OT Treatment/Interventions:      OT Goals(Current goals can be found in the care plan section) Acute Rehab OT Goals Patient Stated Goal: none stated OT Goal Formulation: All assessment and education complete, DC therapy  OT Frequency:      Co-evaluation PT/OT/SLP Co-Evaluation/Treatment: Yes Reason for Co-Treatment: Complexity of the patient's impairments (multi-system involvement);For patient/therapist safety   OT goals addressed during session: Strengthening/ROM      AM-PAC OT "6 Clicks" Daily Activity     Outcome Measure Help from another person eating meals?: A Little Help from another person taking care of personal grooming?: A Little Help from another person toileting, which includes using toliet, bedpan, or urinal?: Total Help from another person bathing (including washing, rinsing, drying)?: Total Help from another person to put on and taking off regular upper body clothing?: A Lot Help from another person to put on and taking off regular lower body clothing?: Total 6 Click Score:  11   End of Session Nurse Communication: Mobility status  Activity Tolerance: Patient limited by fatigue Patient left: in bed;with call bell/phone within reach;with bed alarm set                   Time: 1433-1453 OT Time Calculation (min): 20 min Charges:  OT General Charges $OT Visit: 1 Visit OT Evaluation $OT Eval Moderate Complexity: 1 Mod  Brianna Allston Junie Panning, MS, OTR/L  Alvester Morin 02/01/2023, 3:07 PM

## 2023-02-02 ENCOUNTER — Inpatient Hospital Stay: Payer: Medicare HMO

## 2023-02-02 DIAGNOSIS — E039 Hypothyroidism, unspecified: Secondary | ICD-10-CM | POA: Diagnosis not present

## 2023-02-02 DIAGNOSIS — D329 Benign neoplasm of meninges, unspecified: Secondary | ICD-10-CM

## 2023-02-02 DIAGNOSIS — R4182 Altered mental status, unspecified: Secondary | ICD-10-CM | POA: Diagnosis not present

## 2023-02-02 DIAGNOSIS — F0390 Unspecified dementia without behavioral disturbance: Secondary | ICD-10-CM | POA: Diagnosis not present

## 2023-02-02 DIAGNOSIS — K2100 Gastro-esophageal reflux disease with esophagitis, without bleeding: Secondary | ICD-10-CM | POA: Diagnosis not present

## 2023-02-02 LAB — VITAMIN B12: Vitamin B-12: 540 pg/mL (ref 180–914)

## 2023-02-02 LAB — FERRITIN: Ferritin: 26 ng/mL (ref 11–307)

## 2023-02-02 LAB — FOLATE: Folate: 15.3 ng/mL (ref >5.9)

## 2023-02-02 LAB — IRON: Iron: 41 ug/dL (ref 28–170)

## 2023-02-02 MED ORDER — AMLODIPINE BESYLATE 5 MG PO TABS
5.0000 mg | ORAL_TABLET | Freq: Every day | ORAL | Status: DC
  Administered 2023-02-02 – 2023-02-05 (×5): 5 mg via ORAL
  Filled 2023-02-02 (×4): qty 1

## 2023-02-02 MED ORDER — HYDRALAZINE HCL 20 MG/ML IJ SOLN
5.0000 mg | Freq: Once | INTRAMUSCULAR | Status: AC
  Administered 2023-02-02: 5 mg via INTRAVENOUS
  Filled 2023-02-02: qty 1

## 2023-02-02 MED ORDER — HYDRALAZINE HCL 20 MG/ML IJ SOLN
10.0000 mg | Freq: Three times a day (TID) | INTRAMUSCULAR | Status: DC | PRN
  Administered 2023-02-04: 10 mg via INTRAVENOUS
  Filled 2023-02-02: qty 1

## 2023-02-02 MED ORDER — ACETAMINOPHEN 325 MG PO TABS
650.0000 mg | ORAL_TABLET | Freq: Four times a day (QID) | ORAL | Status: DC | PRN
  Administered 2023-02-02 – 2023-02-05 (×8): 650 mg via ORAL
  Filled 2023-02-02 (×8): qty 2

## 2023-02-02 MED ORDER — AMLODIPINE BESYLATE 5 MG PO TABS
5.0000 mg | ORAL_TABLET | Freq: Once | ORAL | Status: AC
  Administered 2023-02-02: 5 mg via ORAL
  Filled 2023-02-02: qty 1

## 2023-02-02 NOTE — Progress Notes (Signed)
       CROSS COVER NOTE  NAME: Davy Quadri MRN: [id number] DOB : Jun 18, 1935    HPI/Events of Note   Report:chat received from nurse bp is elevated at 168/108 after multiple checks. No prn ordered   On review of chart:history of dementia, TIA admitted with metabolic encephalopathy Signigfcant elevation in blood pressures not noted sine 1630 5//23 With systolics in 170s Review of home meds, no antihypertensives    Assessment and  Interventions   Assessment:  Plan: Given age, goal systolic >150 but hesitant to drop too quickly or use of beta blocker - hydralazine 5 mg IV x1 ordered       Donnie Mesa NP [facility] Cross Cover 7pm-7am - check amion for availability Pager [phone]

## 2023-02-02 NOTE — Progress Notes (Signed)
[facility]                                                                               Brianna Woods, is a 88 y.o. female, DOB - [birth year], ZOX:[id number] Admit date - 01/30/2023    Outpatient Primary MD for the patient is Pcp, No  LOS - 2  days    Brief summary   88 y.o. female with medical history significant of TIA, dementia, hyperlipidemia, hypertension, depression, fibromyalgia, coronary artery disease presenting with AMS, UTI, AKI.  Limited history in the setting of altered mental status. Noted admission April 2023 for lethargy and right-sided facial droop concerning for TIA.  Presented to the ER afebrile, hemodynamically stable. Did have some transient blood pressures into the 80s which improved with IV fluid hydration. White count 10.1, hemoglobin 12.6, platelets 210, urinalysis positive for leukocytes, troponin and BNP stable. Chest x-ray and CT head grossly stable though with some cardiomegaly and vascular congestion on imaging   Urine cultures negative.  AKI improving.  Patient is more alert and answering simple questions.    Assessment & Plan    Assessment and Plan: Acute metabolic encephalopathy from AKI. Generalized confusion and lethargy in the setting of dehydration, AKI  CT head grossly stable. Unclear of general baseline. Ordered MRI brain without contrast for further evaluation.  Unable to reach family. Called her son and left voicemail,.  She was started on IV fluids with slight improvement in mental status and renal parameters.  She is alert and oriented to place only. She doesn't remember her son's name.  She does not remember how she ended up in the hospital.  TSH ordered and within normal limits. Vit b12 and vit B1 ordered and pending.  Therapy evaluations ordered . She reports she does not get out of bed and has not been ambulatory .  SLP evaluation recommending dysphagia 1 diet.    TIA (transient ischemic attack) Remote history of TIA  in the past CT head unremarkable  in setting of altered mental status presentation Reassess for further neuroimaging if symptoms persist despite treatment . She is alert oriented to place. Unclear what her baseline is, hence we will obtain an MRI brain without contrast for further evaluation.      UTI (urinary tract infection) UTI ruled out. With cultures less <10,000  AKI (acute kidney injury) (HCC) Creatinine 1.7 on presentation with baseline creatinine around 0.6-0.9 Suspect prerenal etiology , due to poor oral intake.  Improvement renal parameters with IV fluids.  D/c fluids today and repeat renal parameters in am.   HTN (hypertension) Sub optimally controlled,not on BP meds at home.  Started her on NORVASC and IV hydralazine prn.   Acquired hypothyroidism Continue Synthroid TSH and free t4 wnl.   Meningioma [facility]) Remote history of meningioma during admission April 2023 CT head stable Will monitor for now Reassess as clinically indicated  Dementia (HCC) Continue Namenda  CAD (coronary artery disease) Troponin negative x 1 with grossly stable EKG She denies any chest pain or sob today.   GERD (gastroesophageal reflux disease) PPI   Normocytic anemia:  Iron and vit b12 levels ordered and pending.  Hemoglobin stable around  10.   Estimated body mass index is 42.97 kg/m as calculated from the following:   Height as of this encounter: 5' (1.524 m).   Weight as of this encounter: 99.8 kg.  Code Status: full code.  DVT Prophylaxis:  enoxaparin (LOVENOX) injection 40 mg Start: 01/31/23 1000 SCDs Start: 01/31/23 0907   Level of Care: Level of care: Med-Surg Family Communication: None at bedside. Called son twice, left a voice mail, unable to reach family.   Disposition Plan:     Remains inpatient appropriate:  further eval of AMS.   Procedures:  MRI brain without contrast.   Consultants:   None.   Antimicrobials:   Anti-infectives (From admission,  onward)    Start     Dose/Rate Route Frequency Ordered Stop   02/01/23 0700  cefTRIAXone (ROCEPHIN) 1 g in sodium chloride 0.9 % 100 mL IVPB        1 g 200 mL/hr over 30 Minutes Intravenous  Once 01/31/23 0907 02/01/23 1558   01/31/23 0245  cefTRIAXone (ROCEPHIN) 1 g in sodium chloride 0.9 % 100 mL IVPB        1 g 200 mL/hr over 30 Minutes Intravenous  Once 01/31/23 0230 01/31/23 0406        Medications  Scheduled Meds:  amitriptyline  25 mg Oral QHS   amLODipine  5 mg Oral Daily   amLODipine  5 mg Oral Once   aspirin  81 mg Oral Daily   divalproex  250 mg Oral BID   enoxaparin (LOVENOX) injection  40 mg Subcutaneous Q24H   feeding supplement  237 mL Oral BID BM   levothyroxine  75 mcg Oral QAC breakfast   memantine  5 mg Oral BID   multivitamin with minerals  1 tablet Oral Daily   pantoprazole  40 mg Oral Daily   Continuous Infusions:   PRN Meds:.acetaminophen, albuterol, hydrALAZINE, ondansetron **OR** ondansetron (ZOFRAN) IV    Subjective:   Brianna Woods was seen and examined today. Headache. No nausea, vomiting.   Objective:   Vitals:   02/02/23 0859 02/02/23 1108 02/02/23 1111 02/02/23 1219  BP: (!) 180/80 (!) 170/80 (!) 170/80 (!) 177/75  Pulse: 87 92  92  Resp:      Temp:      TempSrc:      SpO2:      Weight:      Height:        Intake/Output Summary (Last 24 hours) at 02/02/2023 1301 Last data filed at 02/02/2023 1004 Gross per 24 hour  Intake 240 ml  Output 1800 ml  Net -1560 ml    Filed Weights   01/30/23 2022  Weight: 99.8 kg     Exam General exam: elderly woman, pleasantly confused.  Respiratory system: Clear to auscultation. Respiratory effort normal. Cardiovascular system: S1 & S2 heard, RRR. No JVD, Gastrointestinal system: Abdomen is nondistended, soft and nontender.  Central nervous system: Alert and oriented to place only. Significant memory deficits.  Extremities: no pedal edema or cyanosis.  Skin: No rashes,  Psychiatry:  anxious.      Data Reviewed:  I have personally reviewed following labs and imaging studies   CBC Lab Results  Component Value Date   WBC 6.4 02/01/2023   RBC 3.64 (L) 02/01/2023   HGB 10.7 (L) 02/01/2023   HCT 33.8 (L) 02/01/2023   MCV 92.9 02/01/2023   MCH 29.4 02/01/2023   PLT 172 02/01/2023   MCHC 31.7 02/01/2023   RDW 14.2 02/01/2023  LYMPHSABS 3.2 01/30/2023   MONOABS 1.1 (H) 01/30/2023   EOSABS 0.1 01/30/2023   BASOSABS 0.0 01/30/2023     Last metabolic panel Lab Results  Component Value Date   NA 142 02/01/2023   K 4.0 02/01/2023   CL 108 02/01/2023   CO2 27 02/01/2023   BUN 16 02/01/2023   CREATININE 0.89 02/01/2023   GLUCOSE 90 02/01/2023   GFRNONAA >60 02/01/2023   GFRAA >60 12/01/2019   CALCIUM 8.6 (L) 02/01/2023   PHOS 3.9 12/01/2019   PROT 6.3 (L) 02/01/2023   ALBUMIN 3.0 (L) 02/01/2023   BILITOT 0.7 02/01/2023   ALKPHOS 63 02/01/2023   AST 20 02/01/2023   ALT 16 02/01/2023   ANIONGAP 7 02/01/2023    CBG (last 3)  No results for input(s): "GLUCAP" in the last 72 hours.    Coagulation Profile: No results for input(s): "INR", "PROTIME" in the last 168 hours.   Radiology Studies: No results found.     Kathlen Mody M.D. [facility] 02/02/2023, 1:01 PM  Available via Epic secure chat 7am-7pm After 7 pm, please refer to night coverage provider listed on amion.

## 2023-02-02 NOTE — Care Management Important Message (Signed)
Important Message  Patient Details  Name: Brianna Woods MRN: [id number] Date of Birth: [birth year]   Medicare Important Message Given:  Yes     [name] 02/02/2023, 10:15 AM

## 2023-02-02 NOTE — Progress Notes (Signed)
BP remains elevated after the patient received a once of hydralazine. MD notified

## 2023-02-03 DIAGNOSIS — N179 Acute kidney failure, unspecified: Secondary | ICD-10-CM | POA: Diagnosis not present

## 2023-02-03 DIAGNOSIS — I10 Essential (primary) hypertension: Secondary | ICD-10-CM | POA: Diagnosis not present

## 2023-02-03 DIAGNOSIS — G459 Transient cerebral ischemic attack, unspecified: Secondary | ICD-10-CM

## 2023-02-03 DIAGNOSIS — R4182 Altered mental status, unspecified: Secondary | ICD-10-CM | POA: Diagnosis not present

## 2023-02-03 LAB — CBC WITH DIFFERENTIAL/PLATELET
Abs Immature Granulocytes: 0.04 10*3/uL (ref 0.00–0.07)
Basophils Absolute: 0 10*3/uL (ref 0.0–0.1)
Basophils Relative: 0 %
Eosinophils Absolute: 0.1 10*3/uL (ref 0.0–0.5)
Eosinophils Relative: 1 %
HCT: 38.4 % (ref 36.0–46.0)
Hemoglobin: 12.4 g/dL (ref 12.0–15.0)
Immature Granulocytes: 1 %
Lymphocytes Relative: 22 %
Lymphs Abs: 1.9 10*3/uL (ref 0.7–4.0)
MCH: 29.2 pg (ref 26.0–34.0)
MCHC: 32.3 g/dL (ref 30.0–36.0)
MCV: 90.4 fL (ref 80.0–100.0)
Monocytes Absolute: 0.6 10*3/uL (ref 0.1–1.0)
Monocytes Relative: 7 %
Neutro Abs: 6.1 10*3/uL (ref 1.7–7.7)
Neutrophils Relative %: 69 %
Platelets: 198 10*3/uL (ref 150–400)
RBC: 4.25 MIL/uL (ref 3.87–5.11)
RDW: 13.8 % (ref 11.5–15.5)
WBC: 8.7 10*3/uL (ref 4.0–10.5)
nRBC: 0 % (ref 0.0–0.2)

## 2023-02-03 LAB — BASIC METABOLIC PANEL
Anion gap: 10 (ref 5–15)
BUN: 6 mg/dL — ABNORMAL LOW (ref 8–23)
CO2: 27 mmol/L (ref 22–32)
Calcium: 9.2 mg/dL (ref 8.9–10.3)
Chloride: 101 mmol/L (ref 98–111)
Creatinine, Ser: 0.83 mg/dL (ref 0.44–1.00)
GFR, Estimated: >60 mL/min (ref >60)
Glucose, Bld: 105 mg/dL — ABNORMAL HIGH (ref 70–99)
Potassium: 3 mmol/L — ABNORMAL LOW (ref 3.5–5.1)
Sodium: 138 mmol/L (ref 135–145)

## 2023-02-03 MED ORDER — POTASSIUM CHLORIDE 10 MEQ/100ML IV SOLN
10.0000 meq | INTRAVENOUS | Status: AC
  Administered 2023-02-03 (×3): 10 meq via INTRAVENOUS
  Filled 2023-02-03 (×3): qty 100

## 2023-02-03 NOTE — Progress Notes (Signed)
[facility]  PROGRESS NOTE  Brianna Woods ZOX:[id number] DOB: [birth year] DOA: 01/30/2023 PCP: Pcp, No   Brief HPI:   88 y.o. female with medical history significant of TIA, dementia, hyperlipidemia, hypertension, depression, fibromyalgia, coronary artery disease presenting with AMS, UTI, AKI.  Noted admission April 2023 for lethargy and right-sided facial droop concerning for TIA.  Patient was brought to the hospital from skilled nursing facility for lethargy and altered mental status.  Transiently patient's SBP was in the 80s in the ED, improved with IV fluid bolus.  Chest x-ray and CT head were grossly stable.   Assessment/Plan:   Acute metabolic encephalopathy -Significantly improved with IV fluids -CT head unremarkable, MRI brain unremarkable -B12 540, serum iron 41, TSH 2.050, B1 level pending -Swallow evaluation obtained, recommended dysphagia 1 diet  TIA -Reports history of TIA in the past -CT head and MRI brain unremarkable  Abnormal UA -UTI ruled out, urine culture less than 10,000, insignificant growth  Hypokalemia -Potassium is 3.0, replace potassium and follow BMP in am  Acute kidney injury -Creatinine 1.7 on presentation with baseline creatinine around 0.6-0.9 -Resolved with IV fluids -Creatinine is down to 0.93  Hypertension -Continue amlodipine 5 mg daily -Continue hydralazine as needed  Hypothyroidism -Continue Synthroid -TSH within normal limits  History of meningioma -Remote history of meningioma during admission in April 2023 -MRI brain shows stable meningioma  Dementia -Continue Namenda  CAD -Denies chest pain or shortness of breath -EKG stable  Normocytic anemia -Hemoglobin stable at 12.4    Medications     amitriptyline  25 mg Oral QHS   amLODipine  5 mg Oral Daily   aspirin  81 mg Oral Daily   divalproex  250 mg Oral BID   enoxaparin (LOVENOX) injection  40 mg Subcutaneous Q24H   feeding supplement  237 mL Oral BID BM    levothyroxine  75 mcg Oral QAC breakfast   memantine  5 mg Oral BID   multivitamin with minerals  1 tablet Oral Daily   pantoprazole  40 mg Oral Daily     Data Reviewed:   CBG:  No results for input(s): "GLUCAP" in the last 168 hours.  SpO2: 97 %    Vitals:   02/02/23 1525 02/02/23 2000 02/03/23 0422 02/03/23 0746  BP: (!) 155/66 (!) 142/84 (!) 161/79 (!) 153/84  Pulse: 92 93 97 82  Resp: 18 20 18 18   Temp: 99.3 F (37.4 C) 98.6 F (37 C) 98.3 F (36.8 C) 98.3 F (36.8 C)  TempSrc:  Oral  Oral  SpO2: 96% 94% 99% 97%  Weight:      Height:          Data Reviewed:  Basic Metabolic Panel: Recent Labs  Lab 01/30/23 2026 02/01/23 0457 02/03/23 0449  NA 138 142 138  K 3.8 4.0 3.0*  CL 100 108 101  CO2 28 27 27   GLUCOSE 98 90 105*  BUN 17 16 6*  CREATININE 1.67* 0.89 0.83  CALCIUM 9.1 8.6* 9.2    CBC: Recent Labs  Lab 01/30/23 2026 02/01/23 0457 02/03/23 0449  WBC 10.1 6.4 8.7  NEUTROABS 5.7  --  6.1  HGB 12.6 10.7* 12.4  HCT 39.9 33.8* 38.4  MCV 93.2 92.9 90.4  PLT 210 172 198    LFT Recent Labs  Lab 01/30/23 2026 02/01/23 0457  AST 22 20  ALT 17 16  ALKPHOS 63 63  BILITOT 0.6 0.7  PROT 7.1 6.3*  ALBUMIN 3.6 3.0*  Antibiotics: Anti-infectives (From admission, onward)    Start     Dose/Rate Route Frequency Ordered Stop   02/01/23 0700  cefTRIAXone (ROCEPHIN) 1 g in sodium chloride 0.9 % 100 mL IVPB        1 g 200 mL/hr over 30 Minutes Intravenous  Once 01/31/23 0907 02/01/23 1558   01/31/23 0245  cefTRIAXone (ROCEPHIN) 1 g in sodium chloride 0.9 % 100 mL IVPB        1 g 200 mL/hr over 30 Minutes Intravenous  Once 01/31/23 0230 01/31/23 0406        DVT prophylaxis: Lovenox  Code Status: Full code  Family Communication:    CONSULTS    Subjective   Denies any complaints.  Answering questions appropriately.   Objective    Physical Examination:   General-appears in no acute distress Heart-S1-S2, regular, no  murmur auscultated Lungs-clear to auscultation bilaterally, no wheezing or crackles auscultated Abdomen-soft, nontender, no organomegaly Extremities-no edema in the lower extremities Neuro-alert, oriented x3, no focal deficit noted  Status is: Inpatient:             Brianna Woods   [facility] If 7PM-7AM, please contact night-coverage at [email or web address], Office  [phone]   02/03/2023, 1:23 PM  LOS: 3 days

## 2023-02-04 DIAGNOSIS — I10 Essential (primary) hypertension: Secondary | ICD-10-CM | POA: Diagnosis not present

## 2023-02-04 DIAGNOSIS — G459 Transient cerebral ischemic attack, unspecified: Secondary | ICD-10-CM | POA: Diagnosis not present

## 2023-02-04 DIAGNOSIS — N179 Acute kidney failure, unspecified: Secondary | ICD-10-CM | POA: Diagnosis not present

## 2023-02-04 DIAGNOSIS — R4182 Altered mental status, unspecified: Secondary | ICD-10-CM | POA: Diagnosis not present

## 2023-02-04 LAB — BASIC METABOLIC PANEL
Anion gap: 8 (ref 5–15)
BUN: 9 mg/dL (ref 8–23)
CO2: 28 mmol/L (ref 22–32)
Calcium: 9.4 mg/dL (ref 8.9–10.3)
Chloride: 102 mmol/L (ref 98–111)
Creatinine, Ser: 0.72 mg/dL (ref 0.44–1.00)
GFR, Estimated: >60 mL/min (ref >60)
Glucose, Bld: 105 mg/dL — ABNORMAL HIGH (ref 70–99)
Potassium: 3.4 mmol/L — ABNORMAL LOW (ref 3.5–5.1)
Sodium: 138 mmol/L (ref 135–145)

## 2023-02-04 MED ORDER — METOPROLOL TARTRATE 25 MG PO TABS
25.0000 mg | ORAL_TABLET | Freq: Two times a day (BID) | ORAL | Status: DC
  Administered 2023-02-04 – 2023-02-05 (×3): 25 mg via ORAL
  Filled 2023-02-04 (×3): qty 1

## 2023-02-04 MED ORDER — POTASSIUM CHLORIDE 20 MEQ PO PACK
40.0000 meq | PACK | Freq: Once | ORAL | Status: AC
  Administered 2023-02-04: 40 meq via ORAL
  Filled 2023-02-04: qty 2

## 2023-02-04 NOTE — Progress Notes (Signed)
[facility]  PROGRESS NOTE  Brianna Woods ZOX:[id number] DOB: 22-Apr-1935 DOA: 01/30/2023 PCP: Pcp, No   Brief HPI:   88 y.o. female with medical history significant of TIA, dementia, hyperlipidemia, hypertension, depression, fibromyalgia, coronary artery disease presenting with AMS, UTI, AKI.  Noted admission April 2023 for lethargy and right-sided facial droop concerning for TIA.  Patient was brought to the hospital from skilled nursing facility for lethargy and altered mental status.  Transiently patient's SBP was in the 80s in the ED, improved with IV fluid bolus.  Chest x-ray and CT head were grossly stable.   Assessment/Plan:   Acute metabolic encephalopathy -Resolved, back to baseline -Significantly improved with IV fluids -CT head unremarkable, MRI brain unremarkable -B12 540, serum iron 41, TSH 2.050, B1 level pending -Swallow evaluation obtained, recommended dysphagia 1 diet  TIA -Reports history of TIA in the past -CT head and MRI brain unremarkable  Abnormal UA -UTI ruled out, urine culture less than 10,000, insignificant growth  Hypokalemia -Potassium is 3.4 this morning -will give one dose of Klor con 40 meq -Follow BMP in am  Acute kidney injury -Creatinine 1.7 on presentation with baseline creatinine around 0.6-0.9 -Resolved with IV fluids -Creatinine is down to 0.72  Hypertension -Blood pressure is elevated -will start Metoprolol 25 mg po bid -Continue amlodipine 5 mg daily -Continue hydralazine as needed  Hypothyroidism -Continue Synthroid -TSH within normal limits  History of meningioma -Remote history of meningioma during admission in April 2023 -MRI brain shows stable meningioma  Dementia -Continue Namenda  CAD -Denies chest pain or shortness of breath -EKG stable  Normocytic anemia -Hemoglobin stable at 12.4    Medications     amitriptyline  25 mg Oral QHS   amLODipine  5 mg Oral Daily   aspirin  81 mg Oral Daily    divalproex  250 mg Oral BID   enoxaparin (LOVENOX) injection  40 mg Subcutaneous Q24H   feeding supplement  237 mL Oral BID BM   levothyroxine  75 mcg Oral QAC breakfast   memantine  5 mg Oral BID   multivitamin with minerals  1 tablet Oral Daily   pantoprazole  40 mg Oral Daily   potassium chloride  40 mEq Oral Once     Data Reviewed:   CBG:  No results for input(s): "GLUCAP" in the last 168 hours.  SpO2: 98 %    Vitals:   02/03/23 1505 02/04/23 0010 02/04/23 0452 02/04/23 0747  BP: (!) 147/70 (!) 178/98 (!) 152/80 (!) 173/97  Pulse: 88 88 97 98  Resp: 18 19 20 18   Temp: 98.4 F (36.9 C) 98 F (36.7 C) 98.6 F (37 C) 98.4 F (36.9 C)  TempSrc: Oral  Oral Oral  SpO2: 99% 100% 100% 98%  Weight:      Height:          Data Reviewed:  Basic Metabolic Panel: Recent Labs  Lab 01/30/23 2026 02/01/23 0457 02/03/23 0449 02/04/23 0444  NA 138 142 138 138  K 3.8 4.0 3.0* 3.4*  CL 100 108 101 102  CO2 28 27 27 28   GLUCOSE 98 90 105* 105*  BUN 17 16 6* 9  CREATININE 1.67* 0.89 0.83 0.72  CALCIUM 9.1 8.6* 9.2 9.4    CBC: Recent Labs  Lab 01/30/23 2026 02/01/23 0457 02/03/23 0449  WBC 10.1 6.4 8.7  NEUTROABS 5.7  --  6.1  HGB 12.6 10.7* 12.4  HCT 39.9 33.8* 38.4  MCV 93.2 92.9 90.4  PLT 210  172 198    LFT Recent Labs  Lab 01/30/23 2026 02/01/23 0457  AST 22 20  ALT 17 16  ALKPHOS 63 63  BILITOT 0.6 0.7  PROT 7.1 6.3*  ALBUMIN 3.6 3.0*     Antibiotics: Anti-infectives (From admission, onward)    Start     Dose/Rate Route Frequency Ordered Stop   02/01/23 0700  cefTRIAXone (ROCEPHIN) 1 g in sodium chloride 0.9 % 100 mL IVPB        1 g 200 mL/hr over 30 Minutes Intravenous  Once 01/31/23 0907 02/01/23 1558   01/31/23 0245  cefTRIAXone (ROCEPHIN) 1 g in sodium chloride 0.9 % 100 mL IVPB        1 g 200 mL/hr over 30 Minutes Intravenous  Once 01/31/23 0230 01/31/23 0406        DVT prophylaxis: Lovenox  Code Status: Full code  Family  Communication:    CONSULTS    Subjective   Patient seen and examined, denies chest pain or shortness of breath.   Objective    Physical Examination:  General-appears in no acute distress Heart-S1-S2, regular, no murmur auscultated Lungs-clear to auscultation bilaterally, no wheezing or crackles auscultated Abdomen-soft, nontender, no organomegaly Extremities-no edema in the lower extremities Neuro-alert, oriented x3, no focal deficit noted   Status is: Inpatient:             [name]   [facility] If 7PM-7AM, please contact night-coverage at [email or web address], Office  [phone]   02/04/2023, 8:17 AM  LOS: 4 days

## 2023-02-04 NOTE — TOC Progression Note (Signed)
Transition of Care [facility]) - Progression Note    Patient Details  Name: Brianna Woods MRN: [id number] Date of Birth: 1935/07/15  [facility] CM/SW Contact  Liliana Cline, LCSW Phone Number: 02/04/2023, 11:10 AM  Clinical Narrative:    Patient medically ready to return to [facility] per MD. [facility] does not currently accept weekend admissions. Notified Tammy at [facility] that patient is ready, plan for DC to [facility] tomorrow 5/27.  Expected Discharge Plan: Skilled Nursing Facility Barriers to Discharge: Continued Medical Work up  Expected Discharge Plan and Services                                               Social Determinants of Health (SDOH) Interventions SDOH Screenings   Housing: Patient Unable To Answer (01/31/2023)  Depression (PHQ2-9): Medium Risk (11/21/2018)  Tobacco Use: Low Risk  (01/31/2023)    Readmission Risk Interventions     No data to display

## 2023-02-05 DIAGNOSIS — I2510 Atherosclerotic heart disease of native coronary artery without angina pectoris: Secondary | ICD-10-CM | POA: Diagnosis not present

## 2023-02-05 DIAGNOSIS — N179 Acute kidney failure, unspecified: Secondary | ICD-10-CM | POA: Diagnosis not present

## 2023-02-05 DIAGNOSIS — I10 Essential (primary) hypertension: Secondary | ICD-10-CM | POA: Diagnosis not present

## 2023-02-05 DIAGNOSIS — R4182 Altered mental status, unspecified: Secondary | ICD-10-CM | POA: Diagnosis not present

## 2023-02-05 MED ORDER — PREGABALIN 50 MG PO CAPS: 50.0000 mg | ORAL_CAPSULE | Freq: Two times a day (BID) | ORAL | Status: AC

## 2023-02-05 MED ORDER — ENSURE ENLIVE PO LIQD: 237.0000 mL | Freq: Two times a day (BID) | ORAL | 12 refills | Status: AC

## 2023-02-05 MED ORDER — TRAMADOL HCL 50 MG PO TABS: 50.0000 mg | ORAL_TABLET | Freq: Two times a day (BID) | ORAL | 0 refills | Status: AC | PRN

## 2023-02-05 MED ORDER — METOPROLOL TARTRATE 25 MG PO TABS: 25.0000 mg | ORAL_TABLET | Freq: Two times a day (BID) | ORAL | Status: AC

## 2023-02-05 NOTE — Care Management Important Message (Signed)
Important Message  Patient Details  Name: Brianna Woods MRN: [id number] Date of Birth: 1935/03/22   Medicare Important Message Given:  Yes     [name] 02/05/2023, 12:59 PM

## 2023-02-05 NOTE — TOC Transition Note (Signed)
Transition of Care [facility]) - CM/SW Discharge Note   Patient Details  Name: Aliona Sluiter MRN: [id number] Date of Birth: 1934/11/01  Transition of Care [facility]) CM/SW Contact:  Margarito Liner, LCSW Phone Number: 02/05/2023, 12:37 PM   Clinical Narrative:   Patient has orders to discharge to [facility] today. RN is giving report to receiving RN now. EMS transport has been arranged. No further concerns. CSW signing off.  Final next level of care: Skilled Nursing Facility Barriers to Discharge: Barriers Resolved   Patient Goals and CMS Choice   Choice offered to / list presented to : Adult Children  Discharge Placement     Existing PASRR number confirmed : 02/01/23          Patient chooses bed at: [facility] [place] Patient to be transferred to facility by: EMS Name of family member notified: Josie Dixon Patient and family notified of of transfer: 02/05/23  Discharge Plan and Services Additional resources added to the After Visit Summary for                                       Social Determinants of Health (SDOH) Interventions SDOH Screenings   Housing: Patient Unable To Answer (01/31/2023)  Depression (PHQ2-9): Medium Risk (11/21/2018)  Tobacco Use: Low Risk  (01/31/2023)     Readmission Risk Interventions     No data to display

## 2023-02-05 NOTE — Discharge Summary (Signed)
Physician Discharge Summary   Patient: Brianna Woods MRN: [id number] DOB: [birth year]  Admit date:     01/30/2023  Discharge date: 02/05/23  Discharge Physician: [name]   PCP: Pcp, No   Recommendations at discharge:   Dysphagia 1 diet  Discharge Diagnoses: Principal Problem:   AMS (altered mental status) Active Problems:   TIA (transient ischemic attack)   UTI (urinary tract infection)   HTN (hypertension)   AKI (acute kidney injury) (HCC)   Acquired hypothyroidism   GERD (gastroesophageal reflux disease)   CAD (coronary artery disease)   Dementia (HCC)   Meningioma (HCC)  Resolved Problems:   * No resolved hospital problems. *  Hospital Course:  88 y.o. female with medical history significant of TIA, dementia, hyperlipidemia, hypertension, depression, fibromyalgia, coronary artery disease presenting with AMS, UTI, AKI.  Noted admission April 2023 for lethargy and right-sided facial droop concerning for TIA.  Patient was brought to the hospital from skilled nursing facility for lethargy and altered mental status.  Transiently patient's SBP was in the 80s in the ED, improved with IV fluid bolus.  Chest x-ray and CT head were grossly stable  Assessment and Plan:   Acute metabolic encephalopathy -Resolved, back to baseline -Significantly improved with IV fluids -CT head unremarkable, MRI brain unremarkable -B12 540, serum iron 41, TSH 2.050, B1 level pending -Swallow evaluation obtained, recommended dysphagia 1 diet -Will change Lyrica to 50 mg p.o. twice daily, change tramadol to 50 mg p.o. every 12 hours as needed   TIA -Reports history of TIA in the past -CT head and MRI brain unremarkable   Abnormal UA -UTI ruled out, urine culture less than 10,000, insignificant growth   Hypokalemia -Potassium is 3.4  -1 dose of Klor-Con 40 mg given     Acute kidney injury -Creatinine 1.7 on presentation with baseline creatinine around 0.6-0.9 -Resolved with IV  fluids -Creatinine is down to 0.72   Hypertension -Blood pressure has improved after starting metoprolol -Continue amlodipine 5 mg daily    Hypothyroidism -Continue Synthroid -TSH within normal limits   History of meningioma -Remote history of meningioma during admission in April 2023 -MRI brain shows stable meningioma   Dementia -Continue Namenda   CAD -Denies chest pain or shortness of breath -EKG stable   Normocytic anemia -Hemoglobin stable at 12.4           Consultants:  Procedures performed:  Disposition: Skilled nursing facility Diet recommendation:  Discharge Diet Orders (From admission, onward)     Start     Ordered   02/05/23 0000  Diet - low sodium heart healthy        02/05/23 1125           Regular diet dysphagia 1 diet DISCHARGE MEDICATION: Allergies as of 02/05/2023       Reactions   Ivp Dye [iodinated Contrast Media]    Turns red; BP and HR go up   Penicillins    "Sends me in left field"   Statins Other (See Comments)   Body aches (lipitor, simvastatin, pravastatin)        Medication List     STOP taking these medications    diphenhydrAMINE-PE-APAP 6.25-2.5-160 MG/5ML Liqd   predniSONE 10 MG (21) Tbpk tablet Commonly known as: STERAPRED UNI-PAK 21 TAB       TAKE these medications    acetaminophen 500 MG tablet Commonly known as: TYLENOL Take 1,000 mg by mouth 3 (three) times daily.   albuterol 108 (90 Base) MCG/ACT  inhaler Commonly known as: VENTOLIN HFA Inhale 1 puff into the lungs every 4 (four) hours as needed for wheezing or shortness of breath.   alum & mag hydroxide-simeth 400-400-40 MG/5ML suspension Commonly known as: MAALOX PLUS Take 30 mLs by mouth every 6 (six) hours as needed for indigestion.   amitriptyline 25 MG tablet Commonly known as: ELAVIL Take 25 mg by mouth at bedtime. (Take with 10 mg tablet to total of 35 mg) What changed: Another medication with the same name was removed. Continue taking  this medication, and follow the directions you see here.   aspirin 81 MG chewable tablet Chew 1 tablet (81 mg total) by mouth daily.   Cranberry 400 MG Caps Take 400 mg by mouth daily.   diclofenac Sodium 1 % Gel Commonly known as: VOLTAREN Apply 2 g topically 2 (two) times daily.   divalproex 125 MG capsule Commonly known as: DEPAKOTE SPRINKLE Take 250 mg by mouth 2 (two) times daily.   feeding supplement Liqd Take 237 mLs by mouth 2 (two) times daily between meals.   ipratropium-albuterol 0.5-2.5 (3) MG/3ML Soln Commonly known as: DUONEB Take 3 mLs by nebulization every 6 (six) hours as needed.   latanoprost 0.005 % ophthalmic solution Commonly known as: XALATAN Place 1 drop into both eyes daily.   levothyroxine 75 MCG tablet Commonly known as: SYNTHROID Take 75 mcg by mouth daily.   Magnesium 200 MG Tabs Take 1 tablet by mouth daily.   memantine 5 MG tablet Commonly known as: NAMENDA Take 5 mg by mouth 2 (two) times daily.   metoprolol tartrate 25 MG tablet Commonly known as: LOPRESSOR Take 1 tablet (25 mg total) by mouth 2 (two) times daily.   multivitamin with minerals Tabs tablet Take 1 tablet by mouth daily.   omeprazole 20 MG capsule Commonly known as: PRILOSEC Take 20 mg by mouth daily.   pregabalin 50 MG capsule Commonly known as: LYRICA Take 1 capsule (50 mg total) by mouth 2 (two) times daily. What changed:  medication strength how much to take   sennosides-docusate sodium 8.6-50 MG tablet Commonly known as: SENOKOT-S Take 1 tablet by mouth 2 (two) times daily.   traMADol 50 MG tablet Commonly known as: ULTRAM Take 1 tablet (50 mg total) by mouth every 12 (twelve) hours as needed. What changed:  when to take this reasons to take this   Vitamin D3 25 MCG (1000 UT) Caps Take 2,000 Units by mouth daily.        Contact information for after-discharge care     Destination     [facility] Preferred SNF .    Service: Skilled Nursing Contact information: [address] [place] [address] [phone]                    Discharge Exam: Ceasar Mons Weights   01/30/23 2022  Weight: 99.8 kg   General-appears in no acute distress Heart-S1-S2, regular, no murmur auscultated Lungs-clear to auscultation bilaterally, no wheezing or crackles auscultated Abdomen-soft, nontender, no organomegaly Extremities-no edema in the lower extremities Neuro-alert, oriented x3, no focal deficit noted  Condition at discharge: good  The results of significant diagnostics from this hospitalization (including imaging, microbiology, ancillary and laboratory) are listed below for reference.   Imaging Studies: MR BRAIN WO CONTRAST  Result Date: 02/02/2023 CLINICAL DATA:  Mental status change, unknown cause. EXAM: MRI HEAD WITHOUT CONTRAST TECHNIQUE: Multiplanar, multiecho pulse sequences of the brain and surrounding structures were obtained  without intravenous contrast. COMPARISON:  Head CT 01/30/2023.  MRI brain 12/21/2021. FINDINGS: Brain: No acute infarct or hemorrhage. Stable moderate chronic small-vessel disease. Unchanged left frontal meningioma with intraosseous extension. No significant mass effect or midline shift. Mild prominence of the ventricles and sulci, within expected range for age. No extra-axial collection. Vascular: Normal flow voids. Skull and upper cervical spine: As above. Sinuses/Orbits: Unremarkable. Other: None. IMPRESSION: 1. No acute intracranial process. 2. Unchanged left frontal meningioma with intraosseous extension. Electronically Signed   By: Orvan Falconer M.D.   On: 02/02/2023 16:31   US RENAL  Result Date: 01/31/2023 CLINICAL DATA:  Acute kidney injury. EXAM: RENAL / URINARY TRACT ULTRASOUND COMPLETE COMPARISON:  CT stone study earlier same day FINDINGS: Right Kidney: Renal measurements: 9.9 x 3.8 x 4.3 cm = volume: 84 mL. Nonobstructing stone identified towards the  upper pole. No hydronephrosis. Left Kidney: Renal measurements: 11.2 x 5.9 x 5.0 cm = volume: 170 mL. No hydronephrosis. Bladder: Appears normal for degree of bladder distention. Other: None. IMPRESSION: 1. No hydronephrosis. 2. Nonobstructing stone upper pole right kidney. Electronically Signed   By: Kennith Center M.D.   On: 01/31/2023 10:30   CT Renal Stone Study  Result Date: 01/31/2023 CLINICAL DATA:  Abdominal/flank pain, stone suspected EXAM: CT ABDOMEN AND PELVIS WITHOUT CONTRAST TECHNIQUE: Multidetector CT imaging of the abdomen and pelvis was performed following the standard protocol without IV contrast. RADIATION DOSE REDUCTION: This exam was performed according to the departmental dose-optimization program which includes automated exposure control, adjustment of the mA and/or kV according to patient size and/or use of iterative reconstruction technique. COMPARISON:  11/28/2019 FINDINGS: Lower chest: Bibasilar atelectasis. Mild cardiomegaly. Large hiatal hernia. Hepatobiliary: Status post cholecystectomy. Liver unremarkable on this noncontrast examination. Stable extrahepatic biliary ductal dilation likely representing post cholecystectomy change. No intrahepatic biliary ductal dilation. Pancreas: Unremarkable Spleen: Unremarkable Adrenals/Urinary Tract: The adrenal glands are unremarkable. The kidneys are normal in position. Mild bilateral renal cortical atrophy appears stable. Stable 7 mm nonobstructing calculus within the upper pole the right kidney. Tiny cortical hypodensity arises exophytically from the interpolar region of the left kidney which is too small to characterize but appears stable since prior examination and likely represents a tiny cortical cyst. No follow-up imaging is recommended for this lesion. The kidneys are otherwise unremarkable. The bladder is unremarkable. Stomach/Bowel: Mild to moderate pancolonic diverticulosis is present, most severe within the sigmoid colon. The  intra-abdominal stomach, small bowel, and large bowel are otherwise unremarkable. The appendix is unremarkable. No free intraperitoneal gas or fluid. Vascular/Lymphatic: Moderate atherosclerotic calcification within the abdominal aorta. No aortic aneurysm. No pathologic adenopathy within the abdomen and pelvis. Reproductive: Stable fatty mass within the lower uterine segment in keeping with a lipoleiomyoma. The pelvic organs are otherwise unremarkable. Other: No abdominal wall hernia Musculoskeletal: Degenerative changes are noted within the visualized thoracolumbar spine. Stable superior endplate fracture of L1 with minimal loss of height. No acute bone abnormality. No lytic or blastic bone lesion. IMPRESSION: 1. No acute intra-abdominal pathology identified. 2. Mild cardiomegaly. 3. Large hiatal hernia. 4. Mild to moderate pancolonic diverticulosis without superimposed acute inflammatory change. 5. Stable 7 mm nonobstructing right nephrolithiasis. 6. 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 7.  Aortic Atherosclerosis (ICD10-I70.0). Electronically Signed   By: Helyn Numbers M.D.   On: 01/31/2023 03:28   CT Head Wo Contrast  Result Date: 01/30/2023 CLINICAL DATA:  Mental status change, unknown cause. History of and heart failure. EXAM: CT HEAD WITHOUT CONTRAST TECHNIQUE: Contiguous  axial images were obtained from the base of the skull through the vertex without intravenous contrast. RADIATION DOSE REDUCTION: This exam was performed according to the departmental dose-optimization program which includes automated exposure control, adjustment of the mA and/or kV according to patient size and/or use of iterative reconstruction technique. COMPARISON:  CT examination dated December 21, 2021 FINDINGS: Brain: No evidence of acute infarction, hemorrhage, hydrocephalus, extra-axial collection or mass lesion/mass effect. Generalized cerebral atrophy. Low-attenuation of the periventricular white matter presumed  chronic microvascular ischemic changes. Vascular: No hyperdense vessel or unexpected calcification. Skull: Normal. Negative for fracture or focal lesion. Sinuses/Orbits: No acute finding. Other: None. IMPRESSION: 1. No acute intracranial abnormality. 2. Generalized cerebral atrophy and chronic microvascular ischemic changes of the white matter. Electronically Signed   By: Larose Hires D.O.   On: 01/30/2023 21:08   DG Chest Port 1 View  Result Date: 01/30/2023 CLINICAL DATA:  Hypoxia EXAM: PORTABLE CHEST 1 VIEW COMPARISON:  Chest x-ray 11/22/2019 FINDINGS: The heart is enlarged.  There central pulmonary vascular congestion. There is atelectasis in the left lung base. There is no pleural effusion or pneumothorax. No acute fractures are seen. IMPRESSION: Cardiomegaly with central pulmonary vascular congestion. Electronically Signed   By: Darliss Cheney M.D.   On: 01/30/2023 21:02    Microbiology: Results for orders placed or performed during the hospital encounter of 01/30/23  Urine Culture     Status: Abnormal   Collection Time: 01/31/23  1:30 AM   Specimen: Urine, Clean Catch  Result Value Ref Range Status   Specimen Description   Final    URINE, CLEAN CATCH Performed at [facility], [address], [place], [place] [address]    Special Requests   Final    NONE Performed at [facility], [address], [place], [place] [address]    Culture (A)  Final    <10,000 COLONIES/mL INSIGNIFICANT GROWTH Performed at [facility], [address] [place], [place] [address]    Report Status 02/01/2023 FINAL  Final    Labs: CBC: Recent Labs  Lab 01/30/23 2026 02/01/23 0457 02/03/23 0449  WBC 10.1 6.4 8.7  NEUTROABS 5.7  --  6.1  HGB 12.6 10.7* 12.4  HCT 39.9 33.8* 38.4  MCV 93.2 92.9 90.4  PLT 210 172 198   Basic Metabolic Panel: Recent Labs  Lab 01/30/23 2026 02/01/23 0457 02/03/23 0449 02/04/23 0444  NA 138 142 138 138  K 3.8 4.0 3.0* 3.4*  CL 100 108  101 102  CO2 28 27 27 28   GLUCOSE 98 90 105* 105*  BUN 17 16 6* 9  CREATININE 1.67* 0.89 0.83 0.72  CALCIUM 9.1 8.6* 9.2 9.4   Liver Function Tests: Recent Labs  Lab 01/30/23 2026 02/01/23 0457  AST 22 20  ALT 17 16  ALKPHOS 63 63  BILITOT 0.6 0.7  PROT 7.1 6.3*  ALBUMIN 3.6 3.0*   CBG: No results for input(s): "GLUCAP" in the last 168 hours.  Discharge time spent: greater than 30 minutes.  Signed: [name], MD [facility] 02/05/2023

## 2023-02-07 LAB — VITAMIN B1: Vitamin B1 (Thiamine): 152.3 nmol/L (ref 66.5–200.0)

## 2024-09-11 ENCOUNTER — Emergency Department: Admission: EM | Admit: 2024-09-11 | Discharge: 2024-09-11 | Disposition: A | Discharge status: Skilled Nursing Facility

## 2024-09-11 ENCOUNTER — Emergency Department

## 2024-09-11 ENCOUNTER — Other Ambulatory Visit: Payer: Self-pay

## 2024-09-11 ENCOUNTER — Encounter: Payer: Self-pay | Admitting: Emergency Medicine

## 2024-09-11 DIAGNOSIS — R0602 Shortness of breath: Secondary | ICD-10-CM | POA: Diagnosis not present

## 2024-09-11 DIAGNOSIS — I2510 Atherosclerotic heart disease of native coronary artery without angina pectoris: Secondary | ICD-10-CM | POA: Diagnosis not present

## 2024-09-11 DIAGNOSIS — R079 Chest pain, unspecified: Secondary | ICD-10-CM | POA: Diagnosis present

## 2024-09-11 LAB — CBC
HCT: 38.4 % (ref 36.0–46.0)
Hemoglobin: 12.5 g/dL (ref 12.0–15.0)
MCH: 30.8 pg (ref 26.0–34.0)
MCHC: 32.6 g/dL (ref 30.0–36.0)
MCV: 94.6 fL (ref 80.0–100.0)
Platelets: 196 K/uL (ref 150–400)
RBC: 4.06 MIL/uL (ref 3.87–5.11)
RDW: 12.8 % (ref 11.5–15.5)
WBC: 8 K/uL (ref 4.0–10.5)
nRBC: 0 % (ref 0.0–0.2)

## 2024-09-11 LAB — BASIC METABOLIC PANEL WITH GFR
Anion gap: 12 (ref 5–15)
BUN: 13 mg/dL (ref 8–23)
CO2: 26 mmol/L (ref 22–32)
Calcium: 9.4 mg/dL (ref 8.9–10.3)
Chloride: 103 mmol/L (ref 98–111)
Creatinine, Ser: 0.98 mg/dL (ref 0.44–1.00)
GFR, Estimated: 56 mL/min — ABNORMAL LOW
Glucose, Bld: 156 mg/dL — ABNORMAL HIGH (ref 70–99)
Potassium: 3.9 mmol/L (ref 3.5–5.1)
Sodium: 141 mmol/L (ref 135–145)

## 2024-09-11 LAB — TROPONIN T, HIGH SENSITIVITY
Troponin T High Sensitivity: <15 ng/L (ref 0–19)
Troponin T High Sensitivity: <15 ng/L (ref 0–19)

## 2024-09-11 MED ORDER — LIDOCAINE VISCOUS HCL 2 % MT SOLN
15.0000 mL | Freq: Once | OROMUCOSAL | Status: AC
  Administered 2024-09-11: 15 mL via ORAL
  Filled 2024-09-11: qty 15

## 2024-09-11 MED ORDER — ALUM & MAG HYDROXIDE-SIMETH 200-200-20 MG/5ML PO SUSP
30.0000 mL | Freq: Once | ORAL | Status: AC
  Administered 2024-09-11: 30 mL via ORAL
  Filled 2024-09-11: qty 30

## 2024-09-11 MED ORDER — FAMOTIDINE 20 MG PO TABS
20.0000 mg | ORAL_TABLET | Freq: Once | ORAL | Status: AC
  Administered 2024-09-11: 20 mg via ORAL
  Filled 2024-09-11: qty 1

## 2024-09-11 NOTE — ED Notes (Signed)
 Pt brief changed.

## 2024-09-11 NOTE — ED Triage Notes (Signed)
 Pt arrives via EMS from [facility] with right sided CP that she told the staff about during rounds this morning. Facility gave 324 ASA and 3 nitroglycerin with improvement. Pt arrives on 2L . Hx of Afib. Endorses SOB

## 2024-09-11 NOTE — Discharge Instructions (Signed)
 You were seen today due to concern of chest pain.  At this time your labs are fortunately reassuring however I have placed for a referral to our cardiology team to follow-up with you in the outpatient setting.  They should be calling you within the next 1 or 2 days to arrange for an appointment.  If you have any worsening of symptoms such as increased chest pain, shortness of breath, lightheadedness, cold sweats, or any other symptoms you find concerning please return to the emergency department immediately for further medical management.

## 2024-09-11 NOTE — ED Provider Notes (Signed)
 "  [facility] Provider Note    Event Date/Time   First MD Initiated Contact with Patient 09/11/24 1000     (approximate)   History   Chest Pain   HPI  Brianna Woods is a 89 y.o. female with extensive history of coronary artery disease but also esophageal issues and chronic shortness of breath presenting today with concern of chest pain.  Apparently recently moved here, was telling staff at her nursing home that she was having some increased chest pain which prompted her presentation to our emergency department here.  She was given some nitroglycerin by the staff as well as aspirin  and route and since then the pain has improved.  She states back in [place]  she used to take nitroglycerin frequently and it would help control the pain, but this time it felt like this may have also been related to her esophageal issues which often times also cause her to have pain.  She states she has shortness of breath at baseline but does not seem to be worse than normal, has chronic swelling in extremities but also does not seem to be worse than normal.  Has no other complaints at this time.     Physical Exam   Triage Vital Signs: ED Triage Vitals  Encounter Vitals Group     BP 09/11/24 0958 116/61     Girls Systolic BP Percentile --      Girls Diastolic BP Percentile --      Boys Systolic BP Percentile --      Boys Diastolic BP Percentile --      Pulse Rate 09/11/24 0958 76     Resp 09/11/24 0958 18     Temp 09/11/24 0958 97.9 F (36.6 C)     Temp Source 09/11/24 0958 Oral     SpO2 09/11/24 0952 100 %     Weight 09/11/24 0959 220 lb 0.3 oz (99.8 kg)     Height 09/11/24 0959 5' (1.524 m)     Head Circumference --      Peak Flow --      Pain Score 09/11/24 0957 7     Pain Loc --      Pain Education --      Exclude from Growth Chart --     Most recent vital signs: Vitals:   09/11/24 1030 09/11/24 1150  BP: 105/67 118/63  Pulse: 66 (!) 54  Resp: 12 11  Temp:     SpO2: 100% 99%     General: Awake, no distress.  CV:  Good peripheral perfusion.  Minimal swelling in the bilateral lower extremities Resp:  Normal effort.  Abd:  No distention.  Soft nontender Other:     ED Results / Procedures / Treatments   Labs (all labs ordered are listed, but only abnormal results are displayed) Labs Reviewed  BASIC METABOLIC PANEL WITH GFR - Abnormal; Notable for the following components:      Result Value   Glucose, Bld 156 (*)    GFR, Estimated 56 (*)    All other components within normal limits  CBC  TROPONIN T, HIGH SENSITIVITY  TROPONIN T, HIGH SENSITIVITY  TROPONIN T, HIGH SENSITIVITY     EKG  Sinus rhythm with rate of about 75, axis of -15, there is a right bundle branch block present, but intervals appear to be within normal limits, nonspecific T wave inversion appreciated in leads V1 and V3 which appear similar to the prior EKG.   RADIOLOGY  PROCEDURES:  Critical Care performed: No  Procedures   MEDICATIONS ORDERED IN ED: Medications  alum & mag hydroxide-simeth (MAALOX/MYLANTA) 200-200-20 MG/5ML suspension 30 mL (30 mLs Oral Given 09/11/24 1048)    And  lidocaine (XYLOCAINE) 2 % viscous mouth solution 15 mL (15 mLs Oral Given 09/11/24 1048)  famotidine  (PEPCID ) tablet 20 mg (20 mg Oral Given 09/11/24 1048)     IMPRESSION / MDM / ASSESSMENT AND PLAN / ED COURSE  I reviewed the triage vital signs and the nursing notes.                               Patient's presentation is most consistent with acute complicated illness / injury requiring diagnostic workup.  89 year old female who presents with concern of chest pain which is improved now in response to nitroglycerin which she generally takes at home.  She appears well she is not in any acute distress EKG without new ischemic findings.  Given her presentation we will obtain labs and continue to monitor here.  I feel unlikely PE or other cardiopulmonary etiology possibly GI  related or possibly ACS.  Will follow-up labs and symptoms and determine appropriate disposition accordingly.   Clinical Course as of 09/11/24 1345  Thu Sep 11, 2024  1041 Troponin and remaining labs are reassuring here, patient does continue to have some minor discomfort, I am going to get a second troponin just given how recently the pain started and we will also try Pepcid  and Maalox to help resolve the symptoms. [SK]  1308 Repeat troponin also reassuring, will have the patient discharged home at this time with outpatient cardiology follow-up plan. [SK]  1313 Attempted calling patients children listed in her emergency contact and was unsuccessful.  Will have her discharged back to the facility at this time with plan for outpatient cardiology follow-up. [SK]    Clinical Course User Index [SK] Fernand Rossie HERO, MD     FINAL CLINICAL IMPRESSION(S) / ED DIAGNOSES   Final diagnoses:  Chest pain, unspecified type     Rx / DC Orders   ED Discharge Orders          Ordered    Ambulatory referral to Cardiology       Comments: If you have not heard from the Cardiology office within the next 72 hours please call [phone].   09/11/24 1315             Note:  This document was prepared using Dragon voice recognition software and may include unintentional dictation errors.   Fernand Rossie HERO, MD 09/11/24 1345  "

## 2024-09-11 NOTE — ED Notes (Signed)
 Have made multiple attempts to contact patients son Cathlyn at [phone] with it going to voicemail each time. Pt states that he will come and pick her up. The facility has been contacted and they state she is bed bound and unable to walk. Will set transportation up for her to go back to [facility].
